# Patient Record
Sex: Female | Born: 1955 | Race: Black or African American | Hispanic: No | Marital: Single | Smoking: Never smoker
Health system: Southern US, Community
[De-identification: ages and names within clinical notes are randomized; demographics above are authoritative.]

## PROBLEM LIST (undated history)

## (undated) DIAGNOSIS — IMO0002 Reserved for concepts with insufficient information to code with codable children: Secondary | ICD-10-CM

## (undated) DIAGNOSIS — E785 Hyperlipidemia, unspecified: Secondary | ICD-10-CM

## (undated) DIAGNOSIS — I10 Essential (primary) hypertension: Secondary | ICD-10-CM

## (undated) DIAGNOSIS — R87619 Unspecified abnormal cytological findings in specimens from cervix uteri: Secondary | ICD-10-CM

## (undated) HISTORY — PX: ABDOMINAL HYSTERECTOMY: SHX81

## (undated) HISTORY — PX: OTHER SURGICAL HISTORY: SHX169

## (undated) HISTORY — DX: Unspecified abnormal cytological findings in specimens from cervix uteri: R87.619

## (undated) HISTORY — DX: Essential (primary) hypertension: I10

## (undated) HISTORY — DX: Hyperlipidemia, unspecified: E78.5

## (undated) HISTORY — DX: Reserved for concepts with insufficient information to code with codable children: IMO0002

## (undated) HISTORY — PX: COLPOSCOPY: SHX161

---

## 1997-11-26 ENCOUNTER — Encounter: Payer: Self-pay | Admitting: Emergency Medicine

## 1997-11-26 ENCOUNTER — Emergency Department (HOSPITAL_COMMUNITY): Admission: EM | Admit: 1997-11-26 | Discharge: 1997-11-26 | Payer: Self-pay | Admitting: Emergency Medicine

## 1999-09-02 ENCOUNTER — Encounter: Payer: Self-pay | Admitting: Family Medicine

## 1999-09-02 ENCOUNTER — Ambulatory Visit (HOSPITAL_COMMUNITY): Admission: RE | Admit: 1999-09-02 | Discharge: 1999-09-02 | Payer: Self-pay | Admitting: Family Medicine

## 2000-03-16 ENCOUNTER — Other Ambulatory Visit: Admission: RE | Admit: 2000-03-16 | Discharge: 2000-03-16 | Payer: Self-pay | Admitting: *Deleted

## 2000-06-30 ENCOUNTER — Emergency Department (HOSPITAL_COMMUNITY): Admission: EM | Admit: 2000-06-30 | Discharge: 2000-06-30 | Payer: Self-pay | Admitting: *Deleted

## 2001-01-08 ENCOUNTER — Encounter: Payer: Self-pay | Admitting: Family Medicine

## 2001-01-08 ENCOUNTER — Ambulatory Visit (HOSPITAL_COMMUNITY): Admission: RE | Admit: 2001-01-08 | Discharge: 2001-01-08 | Payer: Self-pay | Admitting: Family Medicine

## 2001-04-16 ENCOUNTER — Encounter: Admission: RE | Admit: 2001-04-16 | Discharge: 2001-04-16 | Payer: Self-pay | Admitting: *Deleted

## 2001-04-16 ENCOUNTER — Encounter: Payer: Self-pay | Admitting: *Deleted

## 2002-05-15 ENCOUNTER — Ambulatory Visit (HOSPITAL_COMMUNITY): Admission: RE | Admit: 2002-05-15 | Discharge: 2002-05-15 | Payer: Self-pay | Admitting: Family Medicine

## 2002-05-15 ENCOUNTER — Encounter: Payer: Self-pay | Admitting: Family Medicine

## 2003-05-19 ENCOUNTER — Emergency Department (HOSPITAL_COMMUNITY): Admission: EM | Admit: 2003-05-19 | Discharge: 2003-05-19 | Payer: Self-pay | Admitting: *Deleted

## 2004-04-26 ENCOUNTER — Encounter: Admission: RE | Admit: 2004-04-26 | Discharge: 2004-04-26 | Payer: Self-pay | Admitting: Family Medicine

## 2005-01-23 ENCOUNTER — Emergency Department (HOSPITAL_COMMUNITY): Admission: EM | Admit: 2005-01-23 | Discharge: 2005-01-23 | Payer: Self-pay | Admitting: Emergency Medicine

## 2005-08-01 ENCOUNTER — Emergency Department (HOSPITAL_COMMUNITY): Admission: EM | Admit: 2005-08-01 | Discharge: 2005-08-02 | Payer: Self-pay | Admitting: Emergency Medicine

## 2006-03-17 ENCOUNTER — Emergency Department (HOSPITAL_COMMUNITY): Admission: EM | Admit: 2006-03-17 | Discharge: 2006-03-17 | Payer: Self-pay | Admitting: Emergency Medicine

## 2006-10-24 ENCOUNTER — Ambulatory Visit (HOSPITAL_COMMUNITY): Admission: RE | Admit: 2006-10-24 | Discharge: 2006-10-24 | Payer: Self-pay | Admitting: Obstetrics and Gynecology

## 2007-01-22 ENCOUNTER — Inpatient Hospital Stay (HOSPITAL_COMMUNITY): Admission: RE | Admit: 2007-01-22 | Discharge: 2007-01-23 | Payer: Self-pay | Admitting: Obstetrics and Gynecology

## 2007-11-01 ENCOUNTER — Encounter: Admission: RE | Admit: 2007-11-01 | Discharge: 2007-11-01 | Payer: Self-pay | Admitting: Family Medicine

## 2008-12-11 ENCOUNTER — Encounter: Admission: RE | Admit: 2008-12-11 | Discharge: 2008-12-11 | Payer: Self-pay | Admitting: Family Medicine

## 2010-02-14 ENCOUNTER — Encounter: Payer: Self-pay | Admitting: Family Medicine

## 2010-04-06 ENCOUNTER — Other Ambulatory Visit: Payer: Self-pay | Admitting: Family Medicine

## 2010-04-06 DIAGNOSIS — R221 Localized swelling, mass and lump, neck: Secondary | ICD-10-CM

## 2010-04-06 DIAGNOSIS — Z1231 Encounter for screening mammogram for malignant neoplasm of breast: Secondary | ICD-10-CM

## 2010-04-09 ENCOUNTER — Ambulatory Visit
Admission: RE | Admit: 2010-04-09 | Discharge: 2010-04-09 | Disposition: A | Discharge status: Home / Self Care | Payer: Medicaid Other | Source: Ambulatory Visit | Attending: Family Medicine | Admitting: Family Medicine

## 2010-04-09 DIAGNOSIS — R221 Localized swelling, mass and lump, neck: Secondary | ICD-10-CM

## 2010-04-28 ENCOUNTER — Ambulatory Visit
Admission: RE | Admit: 2010-04-28 | Discharge: 2010-04-28 | Disposition: A | Discharge status: Home / Self Care | Payer: Self-pay | Source: Ambulatory Visit | Attending: Family Medicine | Admitting: Family Medicine

## 2010-04-28 DIAGNOSIS — Z1231 Encounter for screening mammogram for malignant neoplasm of breast: Secondary | ICD-10-CM

## 2010-06-08 NOTE — Op Note (Signed)
Stephanie [name], Stephanie [name]                ACCOUNT NO.:  [id number]   MEDICAL RECORD NO.:  [id number]          PATIENT TYPE:  AMB   LOCATION:  SDC                           FACILITY:  [facility]   PHYSICIAN:  Naima A. Dillard, M.D. DATE OF BIRTH:  [birth year]   DATE OF PROCEDURE:  01/22/2007  DATE OF DISCHARGE:                               OPERATIVE REPORT   TIME:  10:02 a.m.   PREOPERATIVE DIAGNOSIS:  Pelvic relaxation.   POSTOPERATIVE DIAGNOSIS:  Pelvic relaxation.   PROCEDURE:  Anterior and posterior repair and cystoscopy.   ASSISTANT:  Dr. Mancel Bale.   ANESTHESIA:  General.   FINDINGS:  Large cystocele, moderate rectocele.   SPECIMENS:  None.   ESTIMATED BLOOD LOSS:  Minimal about 100 mL.   </COM   COMPLICATIONS:  None.  The patient to PACU in stable condition.   PROCEDURE IN DETAIL:  The patient was taken to the operating room where  she was given general anesthesia, placed in a dorsal lithotomy position  and prepped and draped in a normal sterile fashion.  The cystocele was  large and protruding out of the vagina. Two tenaculum were placed on  either side of where she had her McCall suture prior placed over the  cervix was and 40 mL of Pitressin mixture, 20 mL of 100 was used to  infiltrate. The vaginal mucosa was cut with a knife and the  vesicovaginal mucosa was entered sharply and cut with Metzenbaum  scissors.  The bladder was then bluntly and sharply dissected away from  the vesicovaginal fascia. A Kelly plication was done with 2-0 Vicryl on  a UR5. The excess vaginal mucosa was excised and thrown away. The  vaginal mucosa was reapproximated using O chromic on the UR5 with a  running locked sutures.  Attention was then turned to the patient's  rectocele where 30 mL of Pitressin mixture was used to infiltrate this  area. The incision was made at the posterior portion of the vagina.  The  skin that was around the perineal body was also excised with a knife.  Once the posterior  Frechet,  the rectovaginal fascia was incised and  extended superiorly all way up into the apex where he finished the  anterior repair. The rectum was dissected off of the fascia.  The  patient actually had a small enterocele which was fixed by putting a  pursestring suture around the fascia.  The abdominal cavity was not  entered. The rectovaginal fascia was reapproximated with 2-0 Vicryl.  The perineal body was also reinforced by using 2-0 Vicryl on the  bulbocavernosus muscles.  The skin was reapproximated using 0 chromic in  a subcuticular fashion.  A rectal exam was done at the end of the case.  No suture was palpated in the rectum. A cystoscopy was done. Both  ureters were seen  in the bladder. The bladder had great integrity. The vagina was packed  with 1-inch packing with Estrace cream. The  rectum there was done at the end of the case and no suture was palpated.  Sponge, lap and needle counts  were correct.  The patient went to the  recovery room in stable condition.  The Foley catheter was replaced  after cystoscopy and I did use Betadine to help prevent infection.      Naima A. Charlesetta Garibaldi, M.D.  Electronically Signed     NAD/MEDQ  D:  01/22/2007  T:  01/22/2007  Job:  [id number]

## 2010-06-08 NOTE — H&P (Signed)
NAMEMARILIA, [name]                ACCOUNT NO.:  [id number]   MEDICAL RECORD NO.:  [id number]          PATIENT TYPE:  AMB   LOCATION:  SDC                           FACILITY:  [facility]   PHYSICIAN:  Naima A. Dillard, M.D. DATE OF BIRTH:  1955-08-23   DATE OF ADMISSION:  DATE OF DISCHARGE:                              HISTORY & PHYSICAL   CHIEF COMPLAINT:  Pelvic relaxation.   Ms. [name] is a 55 year old single African-American female, gravida 2,  para 2, who presents for anterior posterior repair of symptomatic pelvic  relaxation and urinary incontinence.  For the past year, the patient has  noted leaking of urine whenever she coughs in spite of practicing Kegel  exercises.  The patient underwent urodynamics to evaluate her bladder  dysfunction and was found not to have stress urinary incontinence.  Consequently, the patient was given options of observation, pessary,  anterior posterior repair for pelvic relaxation.  She has chosen to  proceed with anterior posterior repair.  Her surgery was cancelled in  the past because of hyperglycemia.  She now states that she has been  compliant with her medications and has her blood sugars under better  control.   PAST MEDICAL HISTORY:  Significant for:  1. Diabetes.  2. Hypertension.  3. Asthma.   PAST OB HISTORY:  Significant for vaginal delivery x2.   GYN HISTORY:  Menarche at age 49 years old.  She had a hysterectomy back  in 2004.  She denies any history of abnormal Pap smears or sexually  transmitted diseases.  Her last Pap smear was May 2008.   PAST SURGICAL HISTORY:  In 2004, she had abdominal hysterectomy.  She  denies any problems with anesthesia or history of blood transfusions.   FAMILY HISTORY:  Significant for mother with diabetes.   SOCIAL HISTORY:  The patient is single, and she works as a Museum/gallery conservator.   HABITS:  She denies any alcohol, tobacco, or illicit drug use.   CURRENT MEDICATIONS:  1. Metformin  500 mg b.i.d.  2. Glimepiride 4 mg daily.  3. Triamterene/hydrochlorothiazide 37.5/25 mg 1 daily.  4. Aspirin 81 mg daily.  5. Albuterol inhaler p.r.n.   ALLERGIES:  CODEINE caused nausea.   REVIEW OF SYSTEMS:  The patient does wear glasses.  CARDIOVASCULAR:  She  denies any chest pain.  RESPIRATORY:  She has a history of asthma.  NEUROLOGIC:  She denies any headache or visual changes.  GENITOURINARY:  As above.  MUSCULOSKELETAL:  Unremarkable.  ENDOCRINE:  Significant for  diabetes.   PHYSICAL EXAMINATION:  VITAL SIGNS:  The patient weighs 258 pounds.  Blood pressure 140/80.  She is 5 feet 2-1/2 inches tall.  HEENT:  Pupils are equal.  Hearing is normal.  Throat is clear.  NECK:  Supple without masses.  There is no thyromegaly or cervical  adenopathy.  HEART:  Regular rate and rhythm.  LUNGS:  Clear to auscultation bilaterally.  BACK:  No CVA tenderness.  ABDOMEN:  Nontender without any masses or organomegaly.  EXTREMITIES:  No cyanosis, clubbing, or edema.  PELVIC:  EG/BUS  within normal limits.  Vagina has a large cystocele.  Uterus and cervix are surgically absent.  Adnexa without any masses or  tenderness.  RECTOVAGINAL:  There is a noticeable rectocele  which is very small and  without any masses.   IMPRESSION:  1. Pelvic relaxation.  2. Urinary incontinence.   PLAN:  Anterior posterior repair.  The patient understands the risks are  but not limited to bleeding, infection, damage to internal organ  systems, bowel, bladder, major blood vessels, and need for repeat  surgery in the future.  The patient verbalizes understanding and  understands the risks.  We will also be performing cystoscopy.      Naima A. Charlesetta Garibaldi, M.D.  Electronically Signed     NAD/MEDQ  D:  01/21/2007  T:  01/21/2007  Job:  [id number]

## 2010-06-08 NOTE — H&P (Signed)
Stephanie [name], Stephanie [name]                ACCOUNT NO.:  [id number]   MEDICAL RECORD NO.:  [id number]          PATIENT TYPE:  AMB   LOCATION:  SDC                           FACILITY:  [facility]   PHYSICIAN:  Naima A. Dillard, M.D. DATE OF BIRTH:  03-Apr-1955   DATE OF ADMISSION:  10/25/2006  DATE OF DISCHARGE:                              HISTORY & PHYSICAL   HISTORY OF PRESENT ILLNESS:  Ms. Kunkel is a 55 year old single African  American female, status post hysterectomy, para 2-0-0-2, who presents  for an anterior and posterior repair because of symptomatic pelvic  relaxation and urinary incontinence.  For the past year, the patient has  noticed leaking of urine whenever she coughs, in spite of practicing  Kegel exercises.  The patient underwent urodynamics to evaluate her  bladder dysfunction and was found not to have stress urinary  incontinence.  Consequently, the patient was given the options of  observation, pessary, or anterior-posterior repair for her pelvic  relaxation, and she has chosen to proceed with anterior-posterior  repair.   PAST MEDICAL HISTORY:   OB HISTORY:  Gravida 2, para 2-0-0-2.   GYN HISTORY:  Menarche at 55 years old.  The patient has had a  hysterectomy.  Denies any history of abnormal Pap smears or sexually  transmitted diseases.  Last normal Pap smear was May 2008.   MEDICAL HISTORY:  1. Hypertension.  2. Asthma.  3. Diabetes mellitus.   SURGICAL HISTORY:  1. 2004, total abdominal hysterectomy.  2. She denies any problems with anesthesia.  3. She denies any history of blood transfusions.   FAMILY HISTORY:  Positive for diabetes.   SOCIAL HISTORY:  The patient is single, and she works as a Museum/gallery conservator.   HABITS:  She denies use of alcohol, tobacco, or illicit drugs.   CURRENT MEDICATIONS:  1. Metformin 500 mg 2 tablets daily.  2. Glimepiride  4 mg daily.  3. Triamterene/hydrochlorothiazide 37.5/25, 1 tablet daily.  4. Aspirin 81 mg  daily.  5. Albuterol inhaler as needed.   ALLERGIES:  CODEINE, which causes severe nausea.   REVIEW OF SYSTEMS:  The patient does wear glasses.  She denies any chest  pain, shortness of breath, headache, vision changes, dysuria, hematuria,  nausea, vomiting, diarrhea pelvic pain, and except as is mentioned in  history of present illness, the patient's review of systems is negative.   PHYSICAL EXAMINATION:  VITAL SIGNS:  Blood pressure 140/80, weight is  262, height is 5 feet 2-1/2inches tall.  ENT:  Pupils are equal.  Hearing is normal.  Throat is clear.  NECK: Supple, without masses.  There is no thyromegaly or cervical  adenopathy.  HEART:  Regular rate and rhythm.  LUNGS:  Clear.  BACK:  No CVA tenderness.  ABDOMEN:  No tenderness, masses, or organomegaly.  EXTREMITIES:  No clubbing, cyanosis, or edema.  PELVIC:  EGBUS is within normal limits.  Vagina reveals a large  cystocele.  Uterus and cervix are surgically absent.  Adnexa without any  masses or tenderness.  Rectovaginal without masses, though there is a  noticeable  mild rectocele.   IMPRESSION:  1. Symptomatic pelvic relaxation.  2. Urinary incontinence.   DISPOSITION:  A discussion was held with the patient regarding the  indications for her procedure, along with its risks which include but  are not limited to reaction to anesthesia, damage to adjacent organs,  infection, excessive bleeding, and worsening of her incontinence  symptoms.  The patient verbalized understanding of these risks and has  consented to proceed with an anterior-posterior repair at [facility] on October 25, 2006 at 9:30 a.m.      [name].      Naima A. Charlesetta Garibaldi, M.D.  Electronically Signed    EJP/MEDQ  D:  10/23/2006  T:  10/23/2006  Job:  [id number]

## 2010-06-11 NOTE — Discharge Summary (Signed)
[name], [name]                ACCOUNT NO.:  [id number]   MEDICAL RECORD NO.:  [id number]          PATIENT TYPE:  INP   LOCATION:  9306                          FACILITY:  [facility]   PHYSICIAN:  Naima A. Dillard, M.D. DATE OF BIRTH:  [birth year]   DATE OF ADMISSION:  01/22/2007  DATE OF DISCHARGE:  01/23/2007                               DISCHARGE SUMMARY   DISCHARGE DIAGNOSIS:  Symptomatic pelvic relaxation.   OPERATION:  On the date of admission the patient underwent an  anterior/posterior repair followed by cystoscopy, tolerating procedure  well.   HISTORY OF PRESENT ILLNESS:  [name] is a 55 year old single African  American female gravida 2, para 2 who presents for anterior/posterior  repair because of symptomatic pelvic relaxation and urinary  incontinence.  Please see the patient's dictated history and physical  examination for details.   PREOPERATIVE PHYSICAL EXAM:  Blood pressure 140/80, weight 258 pounds,  height 5 feet 2-and-a-half inches tall.  GENERAL EXAM:  Within normal limits.  PELVIC EXAM:  EG/BUS within normal limits.  Vagina has a very large  cystocele.  Uterus and cervix were surgically absent.  Adnexa was  without any masses or tenderness   HOSPITAL COURSE:  On the day of admission the patient underwent  aforementioned procedure, tolerating it well.  By postoperative day #1  the patient had resumed bowel and bladder function with a postvoid  residual of less than 100 and was therefore deemed ready for discharge  home.  The patient further tolerated a postoperative hemoglobin of 10.2  (preoperative hemoglobin 11.7).  The patient's discharge basic metabolic  panel revealed a sodium of 137, potassium 3.6, chloride 100, CO2 30,  glucose 138, BUN 9, creatinine 0.73, and calcium 9.7.   DISCHARGE MEDICATIONS:  The patient was referred to her home medication  reconciliation form.  She was advised to take:  1. Colace 100 mg twice daily until her bowel movements  are regular.  2. Darvocet-N 100 one to two tablets every 6 hours as needed for pain.  3. Motrin 600 mg every 6 hours with food as needed for pain.   FOLLOWUP:  The patient has an appointment with Dr. Charlesetta Garibaldi on February 05, 2007, at 4:30 p.m.   DISCHARGE INSTRUCTIONS:  The patient was advised to call for any  temperature greater than or equal to 100.5 degrees Fahrenheit orally,  heavy vaginal bleeding, or any pain that is not relieved by her pain  medication.  She was further advised to avoid driving for 2 weeks, heavy  lifting for 6 weeks, intercourse for 6 weeks, that she may walk up  steps, that she may shower.  The patient's diet should be a diabetic  diet.      Stephanie [name].      Naima A. Charlesetta Garibaldi, M.D.  Electronically Signed    EJP/MEDQ  D:  02/10/2007  T:  02/10/2007  Job:  [id number]

## 2010-07-10 ENCOUNTER — Emergency Department (HOSPITAL_COMMUNITY)
Admission: EM | Admit: 2010-07-10 | Discharge: 2010-07-10 | Disposition: A | Discharge status: Home / Self Care | Payer: Self-pay | Attending: Emergency Medicine | Admitting: Emergency Medicine

## 2010-07-10 DIAGNOSIS — R51 Headache: Secondary | ICD-10-CM | POA: Insufficient documentation

## 2010-07-10 DIAGNOSIS — Z79899 Other long term (current) drug therapy: Secondary | ICD-10-CM | POA: Insufficient documentation

## 2010-07-10 DIAGNOSIS — E669 Obesity, unspecified: Secondary | ICD-10-CM | POA: Insufficient documentation

## 2010-07-10 DIAGNOSIS — I10 Essential (primary) hypertension: Secondary | ICD-10-CM | POA: Insufficient documentation

## 2010-07-10 DIAGNOSIS — Z7982 Long term (current) use of aspirin: Secondary | ICD-10-CM | POA: Insufficient documentation

## 2010-07-10 DIAGNOSIS — H81399 Other peripheral vertigo, unspecified ear: Secondary | ICD-10-CM | POA: Insufficient documentation

## 2010-07-10 DIAGNOSIS — E119 Type 2 diabetes mellitus without complications: Secondary | ICD-10-CM | POA: Insufficient documentation

## 2010-07-10 LAB — POCT I-STAT, CHEM 8
BUN: 16 mg/dL (ref 6–23)
Calcium, Ion: 1.27 mmol/L (ref 1.12–1.32)
Chloride: 99 mEq/L (ref 96–112)
Creatinine, Ser: 0.8 mg/dL (ref 0.50–1.10)
Glucose, Bld: 158 mg/dL — ABNORMAL HIGH (ref 70–99)
HCT: 41 % (ref 36.0–46.0)
Hemoglobin: 13.9 g/dL (ref 12.0–15.0)
Potassium: 3.9 mEq/L (ref 3.5–5.1)
Sodium: 139 mEq/L (ref 135–145)
TCO2: 30 mmol/L (ref 0–100)

## 2010-08-23 ENCOUNTER — Ambulatory Visit (INDEPENDENT_AMBULATORY_CARE_PROVIDER_SITE_OTHER): Payer: Self-pay | Admitting: Ophthalmology

## 2010-09-15 ENCOUNTER — Emergency Department (HOSPITAL_COMMUNITY): Payer: Self-pay

## 2010-09-15 ENCOUNTER — Emergency Department (HOSPITAL_COMMUNITY)
Admission: EM | Admit: 2010-09-15 | Discharge: 2010-09-15 | Disposition: A | Discharge status: Home / Self Care | Payer: Self-pay | Attending: Emergency Medicine | Admitting: Emergency Medicine

## 2010-09-15 DIAGNOSIS — E119 Type 2 diabetes mellitus without complications: Secondary | ICD-10-CM | POA: Insufficient documentation

## 2010-09-15 DIAGNOSIS — M545 Low back pain, unspecified: Secondary | ICD-10-CM | POA: Insufficient documentation

## 2010-09-15 DIAGNOSIS — R1031 Right lower quadrant pain: Secondary | ICD-10-CM | POA: Insufficient documentation

## 2010-09-15 DIAGNOSIS — R109 Unspecified abdominal pain: Secondary | ICD-10-CM | POA: Insufficient documentation

## 2010-09-15 DIAGNOSIS — I10 Essential (primary) hypertension: Secondary | ICD-10-CM | POA: Insufficient documentation

## 2010-09-15 DIAGNOSIS — IMO0002 Reserved for concepts with insufficient information to code with codable children: Secondary | ICD-10-CM | POA: Insufficient documentation

## 2010-09-15 DIAGNOSIS — X500XXA Overexertion from strenuous movement or load, initial encounter: Secondary | ICD-10-CM | POA: Insufficient documentation

## 2010-09-15 LAB — POCT I-STAT, CHEM 8
BUN: 26 mg/dL — ABNORMAL HIGH (ref 6–23)
Calcium, Ion: 1.25 mmol/L (ref 1.12–1.32)
Chloride: 100 mEq/L (ref 96–112)
Creatinine, Ser: 1 mg/dL (ref 0.50–1.10)
Glucose, Bld: 291 mg/dL — ABNORMAL HIGH (ref 70–99)
HCT: 36 % (ref 36.0–46.0)
Hemoglobin: 12.2 g/dL (ref 12.0–15.0)
Potassium: 4 mEq/L (ref 3.5–5.1)
Sodium: 136 mEq/L (ref 135–145)
TCO2: 26 mmol/L (ref 0–100)

## 2010-10-29 LAB — CBC
HCT: 30.6 — ABNORMAL LOW
HCT: 34.8 — ABNORMAL LOW
Hemoglobin: 10.2 — ABNORMAL LOW
Hemoglobin: 11.7 — ABNORMAL LOW
MCHC: 33.5
MCHC: 33.5
MCV: 86.5
MCV: 87
Platelets: 284
Platelets: 286
RBC: 3.52 — ABNORMAL LOW
RBC: 4.03
RDW: 14.6
RDW: 14.6
WBC: 11.6 — ABNORMAL HIGH
WBC: 8.8

## 2010-10-29 LAB — BASIC METABOLIC PANEL
BUN: 14
BUN: 9
CO2: 30
CO2: 30
Calcium: 8.6
Calcium: 9.7
Chloride: 100
Chloride: 100
Creatinine, Ser: 0.72
Creatinine, Ser: 0.73
GFR calc Af Amer: >60
GFR calc Af Amer: >60
GFR calc non Af Amer: >60
GFR calc non Af Amer: >60
Glucose, Bld: 127 — ABNORMAL HIGH
Glucose, Bld: 138 — ABNORMAL HIGH
Potassium: 3.6
Potassium: 4.1
Sodium: 137
Sodium: 141

## 2010-11-04 LAB — CBC
HCT: 40
Hemoglobin: 13.4
MCHC: 33.6
MCV: 85.2
Platelets: 274
RBC: 4.69
RDW: 14.3 — ABNORMAL HIGH
WBC: 6.9

## 2010-11-04 LAB — BASIC METABOLIC PANEL
BUN: 14
CO2: 29
Calcium: 9.4
Chloride: 97
Creatinine, Ser: 0.7
GFR calc Af Amer: >60
GFR calc non Af Amer: >60
Glucose, Bld: 325 — ABNORMAL HIGH
Potassium: 3.8
Sodium: 135

## 2010-11-24 ENCOUNTER — Other Ambulatory Visit: Payer: Self-pay | Admitting: Family Medicine

## 2010-11-24 DIAGNOSIS — I2510 Atherosclerotic heart disease of native coronary artery without angina pectoris: Secondary | ICD-10-CM

## 2010-12-02 ENCOUNTER — Other Ambulatory Visit: Payer: Self-pay

## 2010-12-07 ENCOUNTER — Ambulatory Visit
Admission: RE | Admit: 2010-12-07 | Discharge: 2010-12-07 | Disposition: A | Discharge status: Home / Self Care | Payer: Medicare Other | Source: Ambulatory Visit | Attending: Family Medicine | Admitting: Family Medicine

## 2010-12-07 DIAGNOSIS — I2510 Atherosclerotic heart disease of native coronary artery without angina pectoris: Secondary | ICD-10-CM

## 2010-12-08 ENCOUNTER — Other Ambulatory Visit: Payer: Self-pay

## 2010-12-26 ENCOUNTER — Emergency Department (HOSPITAL_COMMUNITY): Payer: Medicare Other

## 2010-12-26 ENCOUNTER — Emergency Department (HOSPITAL_COMMUNITY)
Admission: EM | Admit: 2010-12-26 | Discharge: 2010-12-27 | Disposition: A | Discharge status: Home / Self Care | Payer: Medicare Other | Attending: Emergency Medicine | Admitting: Emergency Medicine

## 2010-12-26 ENCOUNTER — Encounter: Payer: Self-pay | Admitting: Emergency Medicine

## 2010-12-26 ENCOUNTER — Other Ambulatory Visit: Payer: Self-pay

## 2010-12-26 DIAGNOSIS — M25519 Pain in unspecified shoulder: Secondary | ICD-10-CM | POA: Insufficient documentation

## 2010-12-26 DIAGNOSIS — E119 Type 2 diabetes mellitus without complications: Secondary | ICD-10-CM | POA: Insufficient documentation

## 2010-12-26 DIAGNOSIS — W010XXA Fall on same level from slipping, tripping and stumbling without subsequent striking against object, initial encounter: Secondary | ICD-10-CM | POA: Insufficient documentation

## 2010-12-26 DIAGNOSIS — M79609 Pain in unspecified limb: Secondary | ICD-10-CM | POA: Insufficient documentation

## 2010-12-26 DIAGNOSIS — J45909 Unspecified asthma, uncomplicated: Secondary | ICD-10-CM | POA: Insufficient documentation

## 2010-12-26 DIAGNOSIS — W1800XA Striking against unspecified object with subsequent fall, initial encounter: Secondary | ICD-10-CM

## 2010-12-26 DIAGNOSIS — R51 Headache: Secondary | ICD-10-CM | POA: Insufficient documentation

## 2010-12-26 LAB — URINALYSIS, ROUTINE W REFLEX MICROSCOPIC
Bilirubin Urine: NEGATIVE
Glucose, UA: >1000 mg/dL — AB
Hgb urine dipstick: NEGATIVE
Ketones, ur: NEGATIVE mg/dL
Leukocytes, UA: NEGATIVE
Nitrite: NEGATIVE
Protein, ur: NEGATIVE mg/dL
Specific Gravity, Urine: 1.025 (ref 1.005–1.030)
Urobilinogen, UA: 1 mg/dL (ref 0.0–1.0)
pH: 6 (ref 5.0–8.0)

## 2010-12-26 LAB — URINE MICROSCOPIC-ADD ON

## 2010-12-26 MED ORDER — ACETAMINOPHEN 325 MG PO TABS
650.0000 mg | ORAL_TABLET | Freq: Once | ORAL | Status: AC
  Administered 2010-12-26: 650 mg via ORAL
  Filled 2010-12-26: qty 2

## 2010-12-26 NOTE — ED Notes (Signed)
Pt also c/o right shoulder pain and right scapular pain

## 2010-12-26 NOTE — ED Notes (Signed)
Pt states shoe got hung on broken marble in her bathroom and she fell around 6:45.  C/o pain to top of head from hitting it on the commode.  Also c/o pain to R forearm and R sided upper chest pain.  Denies neck pain.

## 2010-12-26 NOTE — ED Provider Notes (Signed)
History     CSN: [id number] Arrival date & time: 12/26/2010  8:36 PM   First MD Initiated Contact with Patient 12/26/10 2212      Chief Complaint  Patient presents with  . Fall    (Consider location/radiation/quality/duration/timing/severity/associated sxs/prior treatment) Patient is a 55 y.o. female presenting with fall. The history is provided by the patient. No language interpreter was used.  Fall The accident occurred 3 to 5 hours ago. The fall occurred while standing. There was no blood loss. The point of impact was the right shoulder and head. The pain is present in the head and right shoulder (R fa). The pain is at a severity of 7/10. The pain is moderate. She was ambulatory at the scene. There was no entrapment after the fall. There was no drug use involved in the accident. There was no alcohol use involved in the accident. Associated symptoms include headaches. Pertinent negatives include no visual change, no fever, no numbness, no nausea, no vomiting, no loss of consciousness and no tingling. She has tried nothing for the symptoms.   She reports that she was walking into the bathroom to use the bathroom and tripped over a tile in the bathroom and hit her head on the toilet her right shoulder on the toilet and her right forearm.  Complaining of head pain right shoulder pain and right forearm pain. Right forearm shows a bruise. Full range of motion to the shoulder. Is taking nothing for pain. Patient denies, chest pain, dizziness or loss of consciousness. Past Medical History  Diagnosis Date  . Diabetes mellitus   . Asthma     History reviewed. No pertinent past surgical history.  No family history on file.  History  Substance Use Topics  . Smoking status: Never Smoker   . Smokeless tobacco: Not on file  . Alcohol Use: No    OB History    Grav Para Term Preterm Abortions TAB SAB Ect Mult Living                  Review of Systems  Constitutional: Negative for fever.    Gastrointestinal: Negative for nausea and vomiting.  Neurological: Positive for headaches. Negative for tingling, loss of consciousness and numbness.  All other systems reviewed and are negative.    Allergies  Avapro and Codeine  Home Medications   Current Outpatient Rx  Name Route Sig Dispense Refill  . ASPIRIN 81 MG PO TABS Oral Take 81 mg by mouth daily.      Marland Kitchen GLIMEPIRIDE PO Oral Take 1 tablet by mouth 2 (two) times daily. Pt. Is unsure of the strength of her medication.     Marland Kitchen METFORMIN HCL 500 MG PO TABS Oral Take 500 mg by mouth 2 (two) times daily with a meal.      . OLMESARTAN MEDOXOMIL 20 MG PO TABS Oral Take 20 mg by mouth daily.      . TRIAMTERENE-HCTZ 37.5-25 MG PO TABS Oral Take 1 tablet by mouth daily.        BP 112/63  Pulse 106  Temp(Src) 99.2 F (37.3 C) (Oral)  Resp 21  SpO2 95%  Physical Exam  Nursing note and vitals reviewed. Constitutional: She appears well-developed and well-nourished.  HENT:  Head: Normocephalic.  Cardiovascular: Normal rate.   Pulmonary/Chest: Effort normal.  Abdominal: Soft.  Musculoskeletal:       Headache bruising on the crown.  R fa and r shoulder tender.    ED Course  Procedures (including  critical care time)  Labs Reviewed  URINALYSIS, ROUTINE W REFLEX MICROSCOPIC - Abnormal; Notable for the following:    Glucose, UA >1000 (*)    All other components within normal limits  GLUCOSE, CAPILLARY - Abnormal; Notable for the following:    Glucose-Capillary 338 (*)    All other components within normal limits  URINE MICROSCOPIC-ADD ON  POCT CBG MONITORING   Dg Shoulder Right  12/26/2010  *RADIOLOGY REPORT*  Clinical Data: Status post fall; pain at the lateral aspect of the right shoulder.  RIGHT SHOULDER - 2+ VIEW  Comparison: None.  Findings: There is no evidence of fracture or dislocation.  Mild irregularity at the distal tip of the scapula is likely degenerative in nature.  The right humeral head is seated within the  glenoid fossa.  Significant degenerative change is noted at the right acromioclavicular joint.  Inferior osteophyte formation corresponds to an increased anatomic risk for impingement.  No significant soft tissue abnormalities are seen.  The visualized portions of the right lung are clear.  IMPRESSION:  1.  No evidence of fracture or dislocation. 2.  Degenerative change at the right acromioclavicular joint; inferior osteophyte formation corresponds to an increased anatomic risk for rotator cuff impingement.  Original Report Authenticated By: Santa Lighter, M.D.   Dg Forearm Right  12/26/2010  *RADIOLOGY REPORT*  Clinical Data: Status post fall.  Proximal forearm pain with swelling.  RIGHT FOREARM - 2 VIEW  Comparison: None.  Findings: The mineralization and alignment are normal.  There is no evidence of acute fracture or dislocation.  No soft tissue abnormalities are identified.  There is mild spurring at the medial humeral epicondyle.  No elbow joint effusion is demonstrated. 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  IMPRESSION: No acute osseous findings.  Original Report Authenticated By: Vivia Ewing, M.D.   Ct Head Wo Contrast  12/26/2010  *RADIOLOGY REPORT*  Clinical Data: Status post fall, with injury to the posterior right side of the head from toilet.  Headache.  CT HEAD WITHOUT CONTRAST  Technique:  Contiguous axial images were obtained from the base of the skull through the vertex without contrast.  Comparison: None.  Findings: There is no evidence of acute infarction, mass lesion, or intra- or extra-axial hemorrhage on CT.  The posterior fossa, including the cerebellum, brainstem and fourth ventricle, is within normal limits.  The third and lateral ventricles, and basal ganglia are unremarkable in appearance.  The cerebral hemispheres are symmetric in appearance, with normal gray- white differentiation.  No mass effect or midline shift is seen.  There is no evidence of fracture;  visualized osseous structures are unremarkable in appearance.  The visualized portions of the orbits are within normal limits.  The paranasal sinuses and mastoid air cells are well-aerated.  No significant soft tissue abnormalities are seen.  IMPRESSION: No evidence of traumatic intracranial injury or fracture.  Original Report Authenticated By: Santa Lighter, M.D.     1. Fall against object       MDM  Here for follow in the bathroom hitting her head right shoulder and right forearm. CT of the head was negative plain films of the right shoulder and right forearm negative. She was given ibuprofen for pain with relief. She was also given a sling for her right forearm.  The urine showed glucose was greater than 1000. The CBG showed greater than 300. Patient admits that she hasn't taken her metformin and at least 2 days. States that she has taken more  and she will get the prescription filled. We gave 1000 of the metformin in the ER prior to her leaving. She refuses insulin. Labs Reviewed  URINALYSIS, ROUTINE W REFLEX MICROSCOPIC - Abnormal; Notable for the following:    Glucose, UA >1000 (*)    All other components within normal limits  GLUCOSE, CAPILLARY - Abnormal; Notable for the following:    Glucose-Capillary 338 (*)    All other components within normal limits  URINE MICROSCOPIC-ADD ON  POCT CBG MONITORING    Date: 12/27/2010  Rate: 110  Rhythm: normal sinus rhythm  QRS Axis: normal  Intervals: normal  ST/T Wave abnormalities: normal  Conduction Disutrbances:none  Narrative Interpretation:   Old EKG Reviewed: unchanged          Sheryle Hail, NP 12/27/10 [place], NP 12/27/10 0131

## 2010-12-27 LAB — GLUCOSE, CAPILLARY: Glucose-Capillary: 338 mg/dL — ABNORMAL HIGH (ref 70–99)

## 2010-12-27 MED ORDER — OXYCODONE-ACETAMINOPHEN 5-325 MG PO TABS: 1.0000 | ORAL_TABLET | Freq: Once | ORAL | Status: DC

## 2010-12-27 MED ORDER — METFORMIN HCL 500 MG PO TABS
1000.0000 mg | ORAL_TABLET | ORAL | Status: AC
  Administered 2010-12-27: 1000 mg via ORAL
  Filled 2010-12-27: qty 2

## 2010-12-27 MED ORDER — IBUPROFEN 800 MG PO TABS
800.0000 mg | ORAL_TABLET | Freq: Once | ORAL | Status: AC
  Administered 2010-12-27: 800 mg via ORAL
  Filled 2010-12-27: qty 1

## 2010-12-27 NOTE — ED Provider Notes (Signed)
Medical screening examination/treatment/procedure(s) were performed by non-physician practitioner and as supervising physician I was immediately available for consultation/collaboration.   Mervin Kung, MD 12/27/10 [phone]

## 2011-01-27 DIAGNOSIS — R87612 Low grade squamous intraepithelial lesion on cytologic smear of cervix (LGSIL): Secondary | ICD-10-CM | POA: Diagnosis not present

## 2011-02-22 DIAGNOSIS — R87612 Low grade squamous intraepithelial lesion on cytologic smear of cervix (LGSIL): Secondary | ICD-10-CM | POA: Diagnosis not present

## 2011-05-09 DIAGNOSIS — E11311 Type 2 diabetes mellitus with unspecified diabetic retinopathy with macular edema: Secondary | ICD-10-CM | POA: Diagnosis not present

## 2011-05-09 DIAGNOSIS — H35359 Cystoid macular degeneration, unspecified eye: Secondary | ICD-10-CM | POA: Diagnosis not present

## 2011-05-09 DIAGNOSIS — E11319 Type 2 diabetes mellitus with unspecified diabetic retinopathy without macular edema: Secondary | ICD-10-CM | POA: Diagnosis not present

## 2011-05-17 ENCOUNTER — Other Ambulatory Visit: Payer: Self-pay | Admitting: Family Medicine

## 2011-05-17 DIAGNOSIS — Z1231 Encounter for screening mammogram for malignant neoplasm of breast: Secondary | ICD-10-CM

## 2011-05-18 ENCOUNTER — Emergency Department (HOSPITAL_COMMUNITY): Payer: Medicare Other

## 2011-05-18 ENCOUNTER — Encounter (HOSPITAL_COMMUNITY): Payer: Self-pay | Admitting: *Deleted

## 2011-05-18 ENCOUNTER — Emergency Department (HOSPITAL_COMMUNITY)
Admission: EM | Admit: 2011-05-18 | Discharge: 2011-05-18 | Disposition: A | Discharge status: Home / Self Care | Payer: Medicare Other | Attending: Emergency Medicine | Admitting: Emergency Medicine

## 2011-05-18 DIAGNOSIS — K5790 Diverticulosis of intestine, part unspecified, without perforation or abscess without bleeding: Secondary | ICD-10-CM

## 2011-05-18 DIAGNOSIS — K5730 Diverticulosis of large intestine without perforation or abscess without bleeding: Secondary | ICD-10-CM | POA: Diagnosis not present

## 2011-05-18 DIAGNOSIS — K7689 Other specified diseases of liver: Secondary | ICD-10-CM | POA: Diagnosis not present

## 2011-05-18 DIAGNOSIS — E119 Type 2 diabetes mellitus without complications: Secondary | ICD-10-CM | POA: Diagnosis not present

## 2011-05-18 DIAGNOSIS — J45909 Unspecified asthma, uncomplicated: Secondary | ICD-10-CM | POA: Insufficient documentation

## 2011-05-18 DIAGNOSIS — R1032 Left lower quadrant pain: Secondary | ICD-10-CM | POA: Insufficient documentation

## 2011-05-18 DIAGNOSIS — R109 Unspecified abdominal pain: Secondary | ICD-10-CM | POA: Diagnosis not present

## 2011-05-18 DIAGNOSIS — K439 Ventral hernia without obstruction or gangrene: Secondary | ICD-10-CM | POA: Diagnosis not present

## 2011-05-18 LAB — DIFFERENTIAL
Basophils Absolute: 0 10*3/uL (ref 0.0–0.1)
Basophils Relative: 0 % (ref 0–1)
Eosinophils Absolute: 0.1 10*3/uL (ref 0.0–0.7)
Eosinophils Relative: 1 % (ref 0–5)
Lymphocytes Relative: 36 % (ref 12–46)
Lymphs Abs: 2.8 10*3/uL (ref 0.7–4.0)
Monocytes Absolute: 0.5 10*3/uL (ref 0.1–1.0)
Monocytes Relative: 6 % (ref 3–12)
Neutro Abs: 4.5 10*3/uL (ref 1.7–7.7)
Neutrophils Relative %: 57 % (ref 43–77)

## 2011-05-18 LAB — CBC
HCT: 35.4 % — ABNORMAL LOW (ref 36.0–46.0)
Hemoglobin: 11.6 g/dL — ABNORMAL LOW (ref 12.0–15.0)
MCH: 28.5 pg (ref 26.0–34.0)
MCHC: 32.8 g/dL (ref 30.0–36.0)
MCV: 87 fL (ref 78.0–100.0)
Platelets: 252 10*3/uL (ref 150–400)
RBC: 4.07 MIL/uL (ref 3.87–5.11)
RDW: 13.4 % (ref 11.5–15.5)
WBC: 7.9 10*3/uL (ref 4.0–10.5)

## 2011-05-18 LAB — URINALYSIS, ROUTINE W REFLEX MICROSCOPIC
Bilirubin Urine: NEGATIVE
Glucose, UA: NEGATIVE mg/dL
Hgb urine dipstick: NEGATIVE
Ketones, ur: NEGATIVE mg/dL
Leukocytes, UA: NEGATIVE
Nitrite: NEGATIVE
Protein, ur: NEGATIVE mg/dL
Specific Gravity, Urine: 1.011 (ref 1.005–1.030)
Urobilinogen, UA: 0.2 mg/dL (ref 0.0–1.0)
pH: 6 (ref 5.0–8.0)

## 2011-05-18 LAB — BASIC METABOLIC PANEL
BUN: 18 mg/dL (ref 6–23)
CO2: 25 mEq/L (ref 19–32)
Calcium: 10 mg/dL (ref 8.4–10.5)
Chloride: 100 mEq/L (ref 96–112)
Creatinine, Ser: 0.8 mg/dL (ref 0.50–1.10)
GFR calc Af Amer: >90 mL/min (ref >90)
GFR calc non Af Amer: 80 mL/min — ABNORMAL LOW (ref >90)
Glucose, Bld: 169 mg/dL — ABNORMAL HIGH (ref 70–99)
Potassium: 4.4 mEq/L (ref 3.5–5.1)
Sodium: 137 mEq/L (ref 135–145)

## 2011-05-18 LAB — GLUCOSE, CAPILLARY: Glucose-Capillary: 147 mg/dL — ABNORMAL HIGH (ref 70–99)

## 2011-05-18 MED ORDER — IOHEXOL 300 MG/ML  SOLN
100.0000 mL | Freq: Once | INTRAMUSCULAR | Status: AC | PRN
  Administered 2011-05-18: 100 mL via INTRAVENOUS

## 2011-05-18 MED ORDER — CIPROFLOXACIN HCL 500 MG PO TABS: 500.0000 mg | ORAL_TABLET | Freq: Two times a day (BID) | ORAL | Status: AC

## 2011-05-18 MED ORDER — METRONIDAZOLE 500 MG PO TABS: 500.0000 mg | ORAL_TABLET | Freq: Two times a day (BID) | ORAL | Status: AC

## 2011-05-18 MED ORDER — METRONIDAZOLE 500 MG PO TABS
500.0000 mg | ORAL_TABLET | Freq: Once | ORAL | Status: AC
  Administered 2011-05-18: 500 mg via ORAL
  Filled 2011-05-18: qty 1

## 2011-05-18 MED ORDER — MORPHINE SULFATE 2 MG/ML IJ SOLN
2.0000 mg | Freq: Once | INTRAMUSCULAR | Status: AC
  Administered 2011-05-18: 2 mg via INTRAVENOUS
  Filled 2011-05-18: qty 1

## 2011-05-18 MED ORDER — HYDROCODONE-ACETAMINOPHEN 5-325 MG PO TABS: 1.0000 | ORAL_TABLET | ORAL | Status: AC | PRN

## 2011-05-18 MED ORDER — CIPROFLOXACIN HCL 500 MG PO TABS
500.0000 mg | ORAL_TABLET | Freq: Once | ORAL | Status: AC
  Administered 2011-05-18: 500 mg via ORAL
  Filled 2011-05-18: qty 1

## 2011-05-18 MED ORDER — SODIUM CHLORIDE 0.9 % IV BOLUS (SEPSIS)
1000.0000 mL | Freq: Once | INTRAVENOUS | Status: AC
  Administered 2011-05-18: 1000 mL via INTRAVENOUS

## 2011-05-18 MED ORDER — ONDANSETRON HCL 4 MG/2ML IJ SOLN
4.0000 mg | INTRAMUSCULAR | Status: DC | PRN
  Administered 2011-05-18: 4 mg via INTRAVENOUS
  Filled 2011-05-18: qty 2

## 2011-05-18 NOTE — ED Provider Notes (Signed)
History     CSN: [id number]  Arrival date & time 05/18/11  1234   First MD Initiated Contact with Patient 05/18/11 1257      Chief Complaint  Patient presents with  . Abdominal Pain    (Consider location/radiation/quality/duration/timing/severity/associated sxs/prior treatment) HPI Comments: Patient with a history of asthma and diabetes presents emergency department with a chief complaint of abdominal pain.  Onset began on Monday, located in the left lower quadrant, described as dull pain, that does not radiate, is intermittent in nature lasting about 5 minutes, severity 8 now 7/10 but spikes to 10/10.  Patient denies any history of abdominal surgery but has a had a partial hysterectomy for uterine fibroids.  Patient denies being on normal replacement therapy and states that she is compliant with her diabetic medications.  Last normal bowel movement was yesterday and has been in menopause since her partial hysterectomy.  Patient denies associated symptoms including melena, hematochezia, nausea, vomiting, dysuria, abnormal vaginal discharge, chest pain, shortness of breath, cough, fevers, night sweats, chills, change in activity, or change in appetite.  The history is provided by the patient.    Past Medical History  Diagnosis Date  . Diabetes mellitus   . Asthma     History reviewed. No pertinent past surgical history.  History reviewed. No pertinent family history.  History  Substance Use Topics  . Smoking status: Never Smoker   . Smokeless tobacco: Not on file  . Alcohol Use: No    OB History    Grav Para Term Preterm Abortions TAB SAB Ect Mult Living                  Review of Systems  Constitutional: Negative for fever, chills and appetite change.  HENT: Negative for congestion.   Eyes: Negative for visual disturbance.  Respiratory: Negative for shortness of breath.   Cardiovascular: Negative for chest pain and leg swelling.  Gastrointestinal: Negative for  abdominal pain.  Genitourinary: Negative for dysuria, urgency and frequency.  Neurological: Negative for dizziness, syncope, weakness, light-headedness, numbness and headaches.  Psychiatric/Behavioral: Negative for confusion.  All other systems reviewed and are negative.    Allergies  Avapro and Codeine  Home Medications   Current Outpatient Rx  Name Route Sig Dispense Refill  . ASPIRIN 81 MG PO TABS Oral Take 81 mg by mouth daily.      Marland Kitchen GLIMEPIRIDE 4 MG PO TABS Oral Take 4 mg by mouth 2 (two) times daily.    Marland Kitchen METFORMIN HCL 500 MG PO TABS Oral Take 500 mg by mouth 2 (two) times daily with a meal.      . OLMESARTAN MEDOXOMIL 20 MG PO TABS Oral Take 20 mg by mouth daily. Pt is out of this medication    . TRIAMTERENE-HCTZ 37.5-25 MG PO TABS Oral Take 1 tablet by mouth daily.        BP 168/79  Pulse 90  Temp(Src) 98.1 F (36.7 C) (Oral)  Resp 16  SpO2 98%  Physical Exam  Nursing note and vitals reviewed. Constitutional: Vital signs are normal. She appears well-developed and well-nourished. No distress.  HENT:  Head: Normocephalic and atraumatic.  Mouth/Throat: Uvula is midline, oropharynx is clear and moist and mucous membranes are normal.  Eyes: Conjunctivae and EOM are normal. Pupils are equal, round, and reactive to light.  Neck: Normal range of motion and full passive range of motion without pain. Neck supple. No spinous process tenderness and no muscular tenderness present. No rigidity. No  Brudzinski's sign noted.  Cardiovascular: Normal rate and regular rhythm.   Pulmonary/Chest: Effort normal and breath sounds normal. No accessory muscle usage. Not tachypneic. No respiratory distress.  Abdominal: Soft. Normal appearance. She exhibits no distension, no ascites, no pulsatile midline mass and no mass. There is tenderness in the left lower quadrant. There is no CVA tenderness. No hernia.         Obese, ttp in LLQ, normal bowel sounds, no distension or peritoneal signs    Lymphadenopathy:    She has no cervical adenopathy.  Neurological: She is alert.  Skin: Skin is warm and dry. No rash noted. She is not diaphoretic.  Psychiatric: She has a normal mood and affect. Her speech is normal and behavior is normal.    ED Course  Procedures (including critical care time)  Labs Reviewed - No data to display No results found.   No diagnosis found.  1:08 PM  Pt not in room  MDM  LLQ Abdominal Pain  [place] Breath takeover pending CT abd r/o diverticulitis, pt stable and in NAD prior to handoff.        Verl Dicker, [place] 06/01/11 0007

## 2011-05-18 NOTE — ED Notes (Signed)
Reports LLQ pain since Monday, denies any n/v/d or urinary symptoms. No distress noted at triage.

## 2011-05-18 NOTE — ED Provider Notes (Signed)
History     CSN: [id number]  Arrival date & time 05/18/11  1234   First MD Initiated Contact with Patient 05/18/11 1257      Chief Complaint  Patient presents with  . Abdominal Pain    (Consider location/radiation/quality/duration/timing/severity/associated sxs/prior treatment) HPI  Past Medical History  Diagnosis Date  . Diabetes mellitus   . Asthma     History reviewed. No pertinent past surgical history.  History reviewed. No pertinent family history.  History  Substance Use Topics  . Smoking status: Never Smoker   . Smokeless tobacco: Not on file  . Alcohol Use: No    OB History    Grav Para Term Preterm Abortions TAB SAB Ect Mult Living                  Review of Systems  Allergies  Avapro and Codeine  Home Medications   Current Outpatient Rx  Name Route Sig Dispense Refill  . ASPIRIN 81 MG PO TABS Oral Take 81 mg by mouth daily.      Marland Kitchen GLIMEPIRIDE 4 MG PO TABS Oral Take 4 mg by mouth 2 (two) times daily.    Marland Kitchen METFORMIN HCL 500 MG PO TABS Oral Take 500 mg by mouth 2 (two) times daily with a meal.      . OLMESARTAN MEDOXOMIL 20 MG PO TABS Oral Take 20 mg by mouth daily. Pt is out of this medication    . TRIAMTERENE-HCTZ 37.5-25 MG PO TABS Oral Take 1 tablet by mouth daily.        BP 105/74  Pulse 73  Temp(Src) 98.1 F (36.7 C) (Oral)  Resp 16  SpO2 96%  Physical Exam  ED Course  Procedures (including critical care time)  Labs Reviewed  CBC - Abnormal; Notable for the following:    Hemoglobin 11.6 (*)    HCT 35.4 (*)    All other components within normal limits  BASIC METABOLIC PANEL - Abnormal; Notable for the following:    Glucose, Bld 169 (*)    GFR calc non Af Amer 80 (*)    All other components within normal limits  GLUCOSE, CAPILLARY - Abnormal; Notable for the following:    Glucose-Capillary 147 (*)    All other components within normal limits  DIFFERENTIAL  URINALYSIS, ROUTINE W REFLEX MICROSCOPIC  URINE CULTURE   Ct  Abdomen Pelvis W Contrast  05/18/2011  *RADIOLOGY REPORT*  Clinical Data: Left lower quadrant abdominal pain.  CT ABDOMEN AND PELVIS WITH CONTRAST  Technique:  Multidetector CT imaging of the abdomen and pelvis was performed following the standard protocol during bolus administration of intravenous contrast.  Contrast: 116mL OMNIPAQUE IOHEXOL 300 MG/ML  SOLN  Comparison: CT abdomen and pelvis 09/15/2010.  Findings: Minimal pleural thickening is evident posteriorly in the lower lobes bilaterally.  The lungs are otherwise clear.  The heart size is normal.  No significant pleural or pericardial effusion is present.  Fatty infiltration of the liver is again seen.  No focal hepatic lesions are evident.  The spleen is unremarkable.  The stomach, duodenum, and pancreas are within normal limits.  The common bile duct and gallbladder are normal.  The adrenal glands are normal bilaterally.  The kidneys are unremarkable.  The ureters and urinary bladder are within normal limits.  The rectosigmoid colon demonstrates diffuse diverticular change. This extends into the entire descending portions of the transverse colon.  There is no focal inflammation to suggest diverticulitis. The appendix is visualized and normal.  The small bowel is within normal limits.  A prominent low ventral abdominal wall hernia contains multiple locules of fat.  There is no herniated bowel and no obstruction.  The bone windows demonstrate mild degenerative changes in the lower lumbar spine.  No focal lytic or blastic lesions are evident.  IMPRESSION:  1.  Stable appearance of ventral abdominal wall hernia contains multiple locules of fat, but no bowel. 2.  Diffuse fatty infiltration of the liver is stable. 3.  Sigmoid diverticulosis without evidence for diverticulitis.  Original Report Authenticated By: Resa Miner. MATTERN, M.D.     No diagnosis found.    MDM  Care of patient taken over from L. Paz- PA-C, please see her note for HPI, ROS and  PE.  Patient here with 3 day history of LLQ abdominal pain.  CT scan reveals ventral hernia, diverticulosis and diffuse fatty infiltration of the liver.  Patient states that her pain has lessened over the past several days and is mild now - I suspect this to have been diverticulitis clinically though CT does not show this.  I will start the patient on cipro and flagyl.  She will follow up with her PCP for any worsening of symptoms.        Idalia Needle [place], [place] 05/18/11 1713

## 2011-05-18 NOTE — Discharge Instructions (Signed)
Diverticulosis Diverticulosis is a common condition that develops when small pouches (diverticula) form in the wall of the colon. The risk of diverticulosis increases with age. It happens more often in people who eat a low-fiber diet. Most individuals with diverticulosis have no symptoms. Those individuals with symptoms usually experience abdominal pain, constipation, or loose stools (diarrhea). HOME CARE INSTRUCTIONS   Increase the amount of fiber in your diet as directed by your caregiver or dietician. This may reduce symptoms of diverticulosis.   Your caregiver may recommend taking a dietary fiber supplement.   Drink at least 6 to 8 glasses of water each day to prevent constipation.   Try not to strain when you have a bowel movement.   Your caregiver may recommend avoiding nuts and seeds to prevent complications, although this is still an uncertain benefit.   Only take over-the-counter or prescription medicines for pain, discomfort, or fever as directed by your caregiver.  FOODS WITH HIGH FIBER CONTENT INCLUDE:  Fruits. Apple, peach, pear, tangerine, raisins, prunes.   Vegetables. Brussels sprouts, asparagus, broccoli, cabbage, carrot, cauliflower, romaine lettuce, spinach, summer squash, tomato, winter squash, zucchini.   Starchy Vegetables. Baked beans, kidney beans, lima beans, split peas, lentils, potatoes (with skin).   Grains. Whole wheat bread, brown rice, bran flake cereal, plain oatmeal, white rice, shredded wheat, bran muffins.  SEEK IMMEDIATE MEDICAL CARE IF:   You develop increasing pain or severe bloating.   You have an oral temperature above 102 F (38.9 C), not controlled by medicine.   You develop vomiting or bowel movements that are bloody or black.  Document Released: 10/08/2003 Document Revised: 12/30/2010 Document Reviewed: 06/10/2009 [facility] Patient Information 2012 [place].

## 2011-05-19 LAB — URINE CULTURE
Colony Count: NO GROWTH
Culture  Setup Time: 201304241342
Culture: NO GROWTH

## 2011-05-21 NOTE — ED Provider Notes (Signed)
Medical screening examination/treatment/procedure(s) were performed by non-physician practitioner and as supervising physician I was immediately available for consultation/collaboration.  Jasper Riling. Alvino Chapel, MD 05/21/11 [phone]

## 2011-05-26 ENCOUNTER — Ambulatory Visit
Admission: RE | Admit: 2011-05-26 | Discharge: 2011-05-26 | Disposition: A | Discharge status: Home / Self Care | Payer: Medicare Other | Source: Ambulatory Visit | Attending: Family Medicine | Admitting: Family Medicine

## 2011-05-26 DIAGNOSIS — Z1231 Encounter for screening mammogram for malignant neoplasm of breast: Secondary | ICD-10-CM | POA: Diagnosis not present

## 2011-06-01 NOTE — ED Provider Notes (Signed)
Medical screening examination/treatment/procedure(s) were performed by non-physician practitioner and as supervising physician I was immediately available for consultation/collaboration.  Jasper Riling. Alvino Chapel, MD 06/01/11 [phone]

## 2011-06-07 ENCOUNTER — Encounter: Payer: Self-pay | Admitting: Obstetrics and Gynecology

## 2011-06-22 DIAGNOSIS — K5730 Diverticulosis of large intestine without perforation or abscess without bleeding: Secondary | ICD-10-CM | POA: Diagnosis not present

## 2011-06-22 DIAGNOSIS — Z1211 Encounter for screening for malignant neoplasm of colon: Secondary | ICD-10-CM | POA: Diagnosis not present

## 2011-06-22 DIAGNOSIS — K648 Other hemorrhoids: Secondary | ICD-10-CM | POA: Diagnosis not present

## 2011-06-22 DIAGNOSIS — K5900 Constipation, unspecified: Secondary | ICD-10-CM | POA: Diagnosis not present

## 2011-07-08 ENCOUNTER — Telehealth: Payer: Self-pay | Admitting: Obstetrics and Gynecology

## 2011-07-08 ENCOUNTER — Encounter: Payer: Self-pay | Admitting: Obstetrics and Gynecology

## 2011-07-08 ENCOUNTER — Ambulatory Visit (INDEPENDENT_AMBULATORY_CARE_PROVIDER_SITE_OTHER): Payer: Medicare Other | Admitting: Obstetrics and Gynecology

## 2011-07-08 VITALS — BP 120/70 | HR 74 | Wt 226.0 lb

## 2011-07-08 DIAGNOSIS — B373 Candidiasis of vulva and vagina: Secondary | ICD-10-CM

## 2011-07-08 DIAGNOSIS — N898 Other specified noninflammatory disorders of vagina: Secondary | ICD-10-CM

## 2011-07-08 DIAGNOSIS — N899 Noninflammatory disorder of vagina, unspecified: Secondary | ICD-10-CM | POA: Diagnosis not present

## 2011-07-08 DIAGNOSIS — IMO0002 Reserved for concepts with insufficient information to code with codable children: Secondary | ICD-10-CM | POA: Insufficient documentation

## 2011-07-08 DIAGNOSIS — Z719 Counseling, unspecified: Secondary | ICD-10-CM

## 2011-07-08 LAB — POCT WET PREP (WET MOUNT)
Whiff Test: NEGATIVE
pH: 4.5

## 2011-07-08 MED ORDER — CLOTRIMAZOLE-BETAMETHASONE 1-0.05 % EX CREA: TOPICAL_CREAM | Freq: Two times a day (BID) | CUTANEOUS | Status: AC

## 2011-07-08 MED ORDER — FLUCONAZOLE 150 MG PO TABS: 150.0000 mg | ORAL_TABLET | Freq: Once | ORAL | Status: AC

## 2011-07-08 NOTE — Patient Instructions (Addendum)
Avoid: - excess soap on genital area (consider using plain oatmeal soap) - use of powder or sprays in genital area - douching - wearing underwear to bed (except with menses) - using more than is directed detergent when washing clothes - tight fitting garments around genital area - excess sugar intake   

## 2011-07-08 NOTE — Progress Notes (Signed)
56 YO complains of vaginal itching x 2 days. Had intercourse for the first time in a long time this past weekend and since then has been itchy.  O: Pelvic:  EGBUS-mild atrophy, vagina-mild atrophy, uterus/cervix-surgically absent, adnexae-no tenderness  Wet Prep: pH-4.5  whiff-negative  yeast  A: Yeast Vaginitis  P: Diflucan 150mg  #1 1 po stat 1 refill        Clotrimazole/Betametasone Cream #30 grams apply to       affected area bid x 7-14 days 1 refill       Perineal hygiene  Evanna Washinton, PA-C

## 2011-07-08 NOTE — Progress Notes (Signed)
Color: no discharge;just itching Odor: no Itching:yes Thin:no Thick:no Fever:no Dyspareunia:no Hx PID:no HX STD:yes Pelvic Pain:no Desires Gc/CT:no Desires HIV,RPR,HbsAG:no

## 2011-08-10 DIAGNOSIS — I10 Essential (primary) hypertension: Secondary | ICD-10-CM | POA: Diagnosis not present

## 2011-08-10 DIAGNOSIS — E7800 Pure hypercholesterolemia, unspecified: Secondary | ICD-10-CM | POA: Diagnosis not present

## 2011-08-10 DIAGNOSIS — IMO0001 Reserved for inherently not codable concepts without codable children: Secondary | ICD-10-CM | POA: Diagnosis not present

## 2011-08-22 ENCOUNTER — Telehealth: Payer: Self-pay | Admitting: Obstetrics and Gynecology

## 2011-08-23 ENCOUNTER — Ambulatory Visit (INDEPENDENT_AMBULATORY_CARE_PROVIDER_SITE_OTHER): Payer: Medicare Other | Admitting: Obstetrics and Gynecology

## 2011-08-23 ENCOUNTER — Encounter: Payer: Self-pay | Admitting: Obstetrics and Gynecology

## 2011-08-23 VITALS — BP 128/70 | Temp 99.0°F | Wt 227.0 lb

## 2011-08-23 DIAGNOSIS — B373 Candidiasis of vulva and vagina: Secondary | ICD-10-CM | POA: Diagnosis not present

## 2011-08-23 DIAGNOSIS — N898 Other specified noninflammatory disorders of vagina: Secondary | ICD-10-CM

## 2011-08-23 DIAGNOSIS — B3731 Acute candidiasis of vulva and vagina: Secondary | ICD-10-CM

## 2011-08-23 LAB — POCT WET PREP (WET MOUNT)
Whiff Test: NEGATIVE
pH: 4.5

## 2011-08-23 MED ORDER — FLUCONAZOLE 150 MG PO TABS: 150.0000 mg | ORAL_TABLET | Freq: Once | ORAL | Status: DC

## 2011-08-23 MED ORDER — FLUCONAZOLE 150 MG PO TABS: ORAL_TABLET | ORAL | Status: DC

## 2011-08-23 NOTE — Progress Notes (Signed)
56 YO with uncontrolled diabetes and seen last month for yeast vaginitis complains of vaginal itching and discharge.  O:  Pelvic: EGBUS-atrophic with mild erythema, vagina-atrophic with thin white discharge, uterus and cervix surgically absent  Wet Prep: pH-4.5 whiff-negative  yeast-moderate  A: Yeast Vaginiis  P: Diflucan 150 mg #2 1 po stat x 2 days;  may repeat in 3 days if needed       Desitin Cream to external vaginal area 3 times weekly       Discussed the possible use of estrogen vaginal cream to fortify vaginal tissue-declines for now      Encouraged to get sugar under control      RTO-as scheduled or prn  [name], PA-C

## 2011-08-23 NOTE — Progress Notes (Signed)
Color: no color Odor: no Itching:yes Thin:no Thick:no Fever:no Dyspareunia:no Hx PID:no HX STD:yes Pelvic Pain:no Desires Gc/CT:no Desires HIV,RPR,HbsAG:no

## 2011-08-23 NOTE — Patient Instructions (Signed)
Buy Desitin Cream three times a week.

## 2011-12-19 DIAGNOSIS — H2510 Age-related nuclear cataract, unspecified eye: Secondary | ICD-10-CM | POA: Diagnosis not present

## 2011-12-19 DIAGNOSIS — E11311 Type 2 diabetes mellitus with unspecified diabetic retinopathy with macular edema: Secondary | ICD-10-CM | POA: Diagnosis not present

## 2012-04-12 DIAGNOSIS — N760 Acute vaginitis: Secondary | ICD-10-CM | POA: Diagnosis not present

## 2012-05-04 ENCOUNTER — Other Ambulatory Visit: Payer: Self-pay

## 2012-05-04 DIAGNOSIS — Z1231 Encounter for screening mammogram for malignant neoplasm of breast: Secondary | ICD-10-CM

## 2012-05-15 DIAGNOSIS — E11311 Type 2 diabetes mellitus with unspecified diabetic retinopathy with macular edema: Secondary | ICD-10-CM | POA: Diagnosis not present

## 2012-05-15 DIAGNOSIS — H25019 Cortical age-related cataract, unspecified eye: Secondary | ICD-10-CM | POA: Diagnosis not present

## 2012-05-15 DIAGNOSIS — H01029 Squamous blepharitis unspecified eye, unspecified eyelid: Secondary | ICD-10-CM | POA: Diagnosis not present

## 2012-05-15 DIAGNOSIS — H35359 Cystoid macular degeneration, unspecified eye: Secondary | ICD-10-CM | POA: Diagnosis not present

## 2012-05-16 DIAGNOSIS — Z124 Encounter for screening for malignant neoplasm of cervix: Secondary | ICD-10-CM | POA: Diagnosis not present

## 2012-05-16 DIAGNOSIS — K649 Unspecified hemorrhoids: Secondary | ICD-10-CM | POA: Diagnosis not present

## 2012-05-16 DIAGNOSIS — R87612 Low grade squamous intraepithelial lesion on cytologic smear of cervix (LGSIL): Secondary | ICD-10-CM | POA: Diagnosis not present

## 2012-05-28 ENCOUNTER — Ambulatory Visit
Admission: RE | Admit: 2012-05-28 | Discharge: 2012-05-28 | Disposition: A | Discharge status: Home / Self Care | Payer: Medicare Other | Source: Ambulatory Visit

## 2012-05-28 DIAGNOSIS — Z1231 Encounter for screening mammogram for malignant neoplasm of breast: Secondary | ICD-10-CM

## 2012-05-30 DIAGNOSIS — E7800 Pure hypercholesterolemia, unspecified: Secondary | ICD-10-CM | POA: Diagnosis not present

## 2012-05-30 DIAGNOSIS — I10 Essential (primary) hypertension: Secondary | ICD-10-CM | POA: Diagnosis not present

## 2012-05-30 DIAGNOSIS — G609 Hereditary and idiopathic neuropathy, unspecified: Secondary | ICD-10-CM | POA: Diagnosis not present

## 2012-05-30 DIAGNOSIS — IMO0001 Reserved for inherently not codable concepts without codable children: Secondary | ICD-10-CM | POA: Diagnosis not present

## 2012-06-07 ENCOUNTER — Encounter (INDEPENDENT_AMBULATORY_CARE_PROVIDER_SITE_OTHER): Payer: Self-pay | Admitting: Ophthalmology

## 2012-06-11 ENCOUNTER — Encounter (INDEPENDENT_AMBULATORY_CARE_PROVIDER_SITE_OTHER): Payer: Medicaid Other | Admitting: Ophthalmology

## 2012-06-11 DIAGNOSIS — E1165 Type 2 diabetes mellitus with hyperglycemia: Secondary | ICD-10-CM | POA: Diagnosis not present

## 2012-06-11 DIAGNOSIS — E11319 Type 2 diabetes mellitus with unspecified diabetic retinopathy without macular edema: Secondary | ICD-10-CM | POA: Diagnosis not present

## 2012-06-11 DIAGNOSIS — H2510 Age-related nuclear cataract, unspecified eye: Secondary | ICD-10-CM

## 2012-06-11 DIAGNOSIS — E1139 Type 2 diabetes mellitus with other diabetic ophthalmic complication: Secondary | ICD-10-CM

## 2012-06-11 DIAGNOSIS — I10 Essential (primary) hypertension: Secondary | ICD-10-CM | POA: Diagnosis not present

## 2012-06-11 DIAGNOSIS — H43819 Vitreous degeneration, unspecified eye: Secondary | ICD-10-CM

## 2012-06-11 DIAGNOSIS — H35039 Hypertensive retinopathy, unspecified eye: Secondary | ICD-10-CM | POA: Diagnosis not present

## 2012-07-16 DIAGNOSIS — L0233 Carbuncle of buttock: Secondary | ICD-10-CM | POA: Diagnosis not present

## 2012-07-16 DIAGNOSIS — B373 Candidiasis of vulva and vagina: Secondary | ICD-10-CM | POA: Diagnosis not present

## 2012-11-20 DIAGNOSIS — Z23 Encounter for immunization: Secondary | ICD-10-CM | POA: Diagnosis not present

## 2012-12-11 DIAGNOSIS — Z0000 Encounter for general adult medical examination without abnormal findings: Secondary | ICD-10-CM | POA: Diagnosis not present

## 2013-02-21 DIAGNOSIS — M25519 Pain in unspecified shoulder: Secondary | ICD-10-CM | POA: Diagnosis not present

## 2013-04-17 DIAGNOSIS — G609 Hereditary and idiopathic neuropathy, unspecified: Secondary | ICD-10-CM | POA: Diagnosis not present

## 2013-04-17 DIAGNOSIS — I10 Essential (primary) hypertension: Secondary | ICD-10-CM | POA: Diagnosis not present

## 2013-04-17 DIAGNOSIS — IMO0001 Reserved for inherently not codable concepts without codable children: Secondary | ICD-10-CM | POA: Diagnosis not present

## 2013-04-17 DIAGNOSIS — E7800 Pure hypercholesterolemia, unspecified: Secondary | ICD-10-CM | POA: Diagnosis not present

## 2013-04-19 DIAGNOSIS — E7800 Pure hypercholesterolemia, unspecified: Secondary | ICD-10-CM | POA: Diagnosis not present

## 2013-04-19 DIAGNOSIS — IMO0001 Reserved for inherently not codable concepts without codable children: Secondary | ICD-10-CM | POA: Diagnosis not present

## 2013-04-19 LAB — BASIC METABOLIC PANEL: Creatinine: 1.2 mg/dL — AB (ref <=1.1)

## 2013-04-19 LAB — LIPID PANEL: LDL Cholesterol: 180 mg/dL

## 2013-05-10 ENCOUNTER — Other Ambulatory Visit: Payer: Self-pay

## 2013-05-10 DIAGNOSIS — Z1231 Encounter for screening mammogram for malignant neoplasm of breast: Secondary | ICD-10-CM

## 2013-05-22 DIAGNOSIS — IMO0001 Reserved for inherently not codable concepts without codable children: Secondary | ICD-10-CM | POA: Diagnosis not present

## 2013-05-29 ENCOUNTER — Ambulatory Visit
Admission: RE | Admit: 2013-05-29 | Discharge: 2013-05-29 | Disposition: A | Discharge status: Home / Self Care | Payer: Medicare Other | Source: Ambulatory Visit

## 2013-05-29 ENCOUNTER — Encounter (INDEPENDENT_AMBULATORY_CARE_PROVIDER_SITE_OTHER): Payer: Self-pay

## 2013-05-29 DIAGNOSIS — Z1231 Encounter for screening mammogram for malignant neoplasm of breast: Secondary | ICD-10-CM | POA: Diagnosis not present

## 2013-07-05 DIAGNOSIS — IMO0001 Reserved for inherently not codable concepts without codable children: Secondary | ICD-10-CM | POA: Diagnosis not present

## 2013-07-05 LAB — HEMOGLOBIN A1C: Hgb A1c MFr Bld: 9.2 % — AB (ref 4.0–6.0)

## 2013-07-10 ENCOUNTER — Ambulatory Visit (INDEPENDENT_AMBULATORY_CARE_PROVIDER_SITE_OTHER): Payer: Medicare Other | Admitting: Ophthalmology

## 2013-07-10 DIAGNOSIS — H35039 Hypertensive retinopathy, unspecified eye: Secondary | ICD-10-CM

## 2013-07-10 DIAGNOSIS — E11319 Type 2 diabetes mellitus with unspecified diabetic retinopathy without macular edema: Secondary | ICD-10-CM

## 2013-07-10 DIAGNOSIS — E1139 Type 2 diabetes mellitus with other diabetic ophthalmic complication: Secondary | ICD-10-CM | POA: Diagnosis not present

## 2013-07-10 DIAGNOSIS — E1165 Type 2 diabetes mellitus with hyperglycemia: Secondary | ICD-10-CM

## 2013-07-10 DIAGNOSIS — H43819 Vitreous degeneration, unspecified eye: Secondary | ICD-10-CM

## 2013-07-10 DIAGNOSIS — H3581 Retinal edema: Secondary | ICD-10-CM

## 2013-07-10 DIAGNOSIS — I10 Essential (primary) hypertension: Secondary | ICD-10-CM | POA: Diagnosis not present

## 2013-07-22 DIAGNOSIS — Z124 Encounter for screening for malignant neoplasm of cervix: Secondary | ICD-10-CM | POA: Diagnosis not present

## 2013-07-22 DIAGNOSIS — Z01419 Encounter for gynecological examination (general) (routine) without abnormal findings: Secondary | ICD-10-CM | POA: Diagnosis not present

## 2013-07-31 ENCOUNTER — Other Ambulatory Visit (INDEPENDENT_AMBULATORY_CARE_PROVIDER_SITE_OTHER): Payer: Medicare Other | Admitting: Ophthalmology

## 2013-07-31 DIAGNOSIS — E1139 Type 2 diabetes mellitus with other diabetic ophthalmic complication: Secondary | ICD-10-CM | POA: Diagnosis not present

## 2013-07-31 DIAGNOSIS — E1165 Type 2 diabetes mellitus with hyperglycemia: Secondary | ICD-10-CM

## 2013-07-31 DIAGNOSIS — H3581 Retinal edema: Secondary | ICD-10-CM | POA: Diagnosis not present

## 2013-08-05 DIAGNOSIS — R58 Hemorrhage, not elsewhere classified: Secondary | ICD-10-CM | POA: Diagnosis not present

## 2013-08-05 DIAGNOSIS — R109 Unspecified abdominal pain: Secondary | ICD-10-CM | POA: Diagnosis not present

## 2013-08-26 DIAGNOSIS — R58 Hemorrhage, not elsewhere classified: Secondary | ICD-10-CM | POA: Diagnosis not present

## 2013-09-13 ENCOUNTER — Encounter: Payer: Self-pay | Admitting: Endocrinology

## 2013-09-13 ENCOUNTER — Ambulatory Visit (INDEPENDENT_AMBULATORY_CARE_PROVIDER_SITE_OTHER): Payer: Medicare Other | Admitting: Endocrinology

## 2013-09-13 VITALS — BP 144/88 | HR 100 | Temp 97.8°F | Resp 16 | Ht 63.0 in | Wt 240.0 lb

## 2013-09-13 DIAGNOSIS — E1339 Other specified diabetes mellitus with other diabetic ophthalmic complication: Secondary | ICD-10-CM

## 2013-09-13 DIAGNOSIS — I10 Essential (primary) hypertension: Secondary | ICD-10-CM | POA: Insufficient documentation

## 2013-09-13 DIAGNOSIS — E1169 Type 2 diabetes mellitus with other specified complication: Secondary | ICD-10-CM | POA: Insufficient documentation

## 2013-09-13 DIAGNOSIS — E11319 Type 2 diabetes mellitus with unspecified diabetic retinopathy without macular edema: Secondary | ICD-10-CM | POA: Insufficient documentation

## 2013-09-13 DIAGNOSIS — E08319 Diabetes mellitus due to underlying condition with unspecified diabetic retinopathy without macular edema: Secondary | ICD-10-CM

## 2013-09-13 DIAGNOSIS — E1159 Type 2 diabetes mellitus with other circulatory complications: Secondary | ICD-10-CM | POA: Insufficient documentation

## 2013-09-13 DIAGNOSIS — E785 Hyperlipidemia, unspecified: Secondary | ICD-10-CM | POA: Insufficient documentation

## 2013-09-13 DIAGNOSIS — E1165 Type 2 diabetes mellitus with hyperglycemia: Principal | ICD-10-CM

## 2013-09-13 DIAGNOSIS — IMO0001 Reserved for inherently not codable concepts without codable children: Secondary | ICD-10-CM | POA: Diagnosis not present

## 2013-09-13 DIAGNOSIS — E1129 Type 2 diabetes mellitus with other diabetic kidney complication: Secondary | ICD-10-CM | POA: Insufficient documentation

## 2013-09-13 LAB — GLUCOSE, POCT (MANUAL RESULT ENTRY): POC Glucose: 260 mg/dl — AB (ref 70–99)

## 2013-09-13 MED ORDER — METFORMIN HCL 1000 MG PO TABS: 1000.0000 mg | ORAL_TABLET | Freq: Two times a day (BID) | ORAL | Status: DC

## 2013-09-13 MED ORDER — ATORVASTATIN CALCIUM 10 MG PO TABS: 10.0000 mg | ORAL_TABLET | Freq: Every day | ORAL | Status: DC

## 2013-09-13 MED ORDER — CANAGLIFLOZIN 100 MG PO TABS: 100.0000 mg | ORAL_TABLET | Freq: Every day | ORAL | Status: DC

## 2013-09-13 NOTE — Patient Instructions (Addendum)
Please check blood sugars at least half the time about 2 hours after any meal and 3 times per week on waking up.  Please bring blood sugar monitor to each visit  Invokana 1 in am before am meal  Metformin 1 in am and 2 at dinner and next week 2 twice daily  Leave off triamterene -Hctz for now, call if having swelling  Exercise regularly at least 2-4 times a week

## 2013-09-13 NOTE — Progress Notes (Signed)
Patient ID: Stephanie [name], female   DOB: [birth year], 58 y.o.   MRN: [id number]           Reason for Appointment: Consultation for Type 2 Diabetes  Referring physician: Hulan Fess  History of Present Illness:          Diagnosis: Type 2 diabetes mellitus, date of diagnosis: 2003       Past history:  She was initially treated metformin at some point glimepiride also added and not clear what her level of control was in the first few years. Had been only taking 500 mg twice a day of metformin and Amaryl increased to 4 mg twice a day several years ago In the last few years her blood sugar control has been more difficult and she has been managed by an endocrinologist Because of poor control she had been tried on [place] and [place] but she states that she could not tolerate them because of headaches She does not think she has been given any other medications   Recent history:  She was last seen by her endocrinologist in 6/15 and despite persistently high A1c she was reluctant to start any other medications besides her Amaryl and metformin. She is now here for establishing care She checks her blood sugar only in the morning, did not bring her monitor for download. She thinks her blood sugars are usually about 160 but her lab glucose readings are consistently over 200 For the last couple of years she has gained about 15 pounds of weight       Oral hypoglycemic drugs the patient is taking are: Amaryl 4 mg twice a day, metformin 500 mg twice a day      Side effects from medications have been: Januvia, Onglyza headaches Compliance with the medical regimen: Fair  Hypoglycemia: none  Glucose monitoring:  done one time a day         Glucometer: One Touch Ultra.      Blood Glucose readings by recall, glucose is usually around 160, higher today, 294  PREMEAL Breakfast Lunch Dinner Bedtime  Overall   Glucose range: 160-294      Median:        Glycemic control:   Lab Results  Component Value  Date   HGBA1C 9.2* 07/05/2013   Lab Results  Component Value Date   LDLCALC 180 04/19/2013   CREATININE 1.2* 04/19/2013   Last urine microalbumin/creatinine ratio 251 in 3/15  Retinal exam: Most recent:7/15.    Self-care: The diet that the patient has been following is: tries to limit fats.      Meals: 3 meals per day. Breakfast is cereal or scrambled eggs. Lunch is half sandwich, yogurt and fruit; dinner usually baked chicken, bread and vegetables, snacks are fruits or nuts. Not eating out regularly Exercise:  walking video 30 min, 3/7 days a week, irregular with this program         Dietician visit: Most recent: Several years ago.               Weight history: Previous films 200-260  Wt Readings from Last 3 Encounters:  09/13/13 240 lb (108.863 kg)  08/23/11 227 lb (102.967 kg)  07/08/11 226 lb (102.513 kg)       Medication List       This list is accurate as of: 09/13/13 11:23 AM.  Always use your most recent med list.               aspirin 81  MG tablet  Take 81 mg by mouth daily.     glimepiride 4 MG tablet  Commonly known as:  AMARYL  Take 4 mg by mouth 2 (two) times daily.     Green Tea 250 MG Caps  Take by mouth.     hydrocortisone valerate cream 0.2 %  Commonly known as:  WESTCORT     losartan 50 MG tablet  Commonly known as:  COZAAR     metFORMIN 500 MG tablet  Commonly known as:  GLUCOPHAGE  Take 500 mg by mouth 2 (two) times daily with a meal.     triamterene-hydrochlorothiazide 37.5-25 MG per tablet  Commonly known as:  MAXZIDE-25  Take 1 tablet by mouth daily.        Allergies:  Allergies  Allergen Reactions  . Avapro [Irbesartan] Other (See Comments)    headaches  . Codeine Nausea And Vomiting    Past Medical History  Diagnosis Date  . Diabetes mellitus   . Asthma   . Hypertension   . Abnormal Pap smear   . LGSIL (low grade squamous intraepithelial dysplasia)     Past Surgical History  Procedure Laterality Date  . Abdominal  hysterectomy    . Bladder surgery    . Colposcopy      Family History  Problem Relation Age of Onset  . Diabetes Mother   . Diabetes Maternal Grandmother     Social History:  reports that she has never smoked. She has never used smokeless tobacco. She reports that she does not drink alcohol or use illicit drugs.    Review of Systems       Lipids: She has not been treated with any medications, LDL has been about 180 in the last year       No results found for this basename: CHOL,  HDL,  LDLCALC,  LDLDIRECT,  TRIG,  CHOLHDL                  Skin: No rash or infections     Thyroid:  No  unusual fatigue.     The blood pressure has been treated only with Maxzide     No history of renal dysfunction      She has had swelling of feet and lower legs, more on standing.     No shortness of breath or chest tightness  on exertion.     Bowel habits: Normal.       No history of urinary tract infections. Occasionally has had vaginal candidiasis      No joint  pains.          She does have a history of mild Numbness but no tingling or burning in feet      Physical Examination:  BP 144/88  Pulse 100  Temp(Src) 97.8 F (36.6 C)  Resp 16  Ht 5\' 3"  (1.6 m)  Wt 240 lb (108.863 kg)  BMI 42.52 kg/m2  SpO2 95%  GENERAL:         Patient has generalized obesity.   HEENT:         Eye exam shows normal external appearance. Fundus exam shows no retinopathy. Oral exam shows normal mucosa .  NECK:         General:  Neck exam shows no lymphadenopathy. Carotids are normal to palpation and no bruit heard.  Thyroid is not enlarged and no nodules felt.   LUNGS:         Chest is symmetrical. Lungs are  clear to auscultation.Marland Kitchen   HEART:         Heart sounds:  S1 and S2 are normal. No murmurs or clicks heard., no S3 or S4.   ABDOMEN:   There is no distention present. Liver and spleen are not palpable. No other mass or tenderness present.  EXTREMITIES:     There is 1+ ankle edema. No skin lesions  present.Marland Kitchen  NEUROLOGICAL:   Vibration sense is moderately reduced in toes. Ankle jerks are absent bilaterally.          Diabetic foot exam:  Diabetic foot exam shows normal monofilament sensation in the toes and plantar surfaces, no skin lesions or ulcers on the feet and normal pedal pulses MUSCULOSKELETAL:       There is no enlargement or deformity of the joints. Spine is normal to inspection.Marland Kitchen   SKIN:       No rash or lesions      ASSESSMENT:  Diabetes type 2, uncontrolled    She has had a persistently high A1c of 9-10% over the last year This indicates progression of her diabetes and failure of current treatment with metformin and glimepiride She also is taking only 1000 mg of metformin daily, renal function is normal She appears to have poor understanding of diabetes and has been reluctant to change her medication regimen or consider alternatives such as injectable medications Also not clear if she is getting reliable home readings since she claims her fasting readings are mostly around 160 despite her A1c being over 9% consistently; also not checking any readings postprandially She has had difficulty losing weight although she has a reasonably good diet and does some exercise Discussed with patient in detail that she has need for improved control especially with her other day having complications of diabetes including retinopathy, microalbuminuria  Complications: Proliferative retinopathy, microalbuminuria  Hyperlipidemia: She has had significant hypercholesterolemia with LDL about 180 but has not been treated with a statin drug. Discussed need for treatment with statins and benefits in reducing cardiovascular events especially with her multiple risk factors  Microalbuminuria and probable hypertension: Currently treated with only low-dose losartan microalbumin will need to be checked again when blood sugars better controlled  Leg edema: This may be partly related to venous insufficiency  but also may have mild nephrotic syndrome  PLAN:  Discussed action of SGLT 2 drugs on lowering glucose by decreasing kidney absorption of glucose, benefits of weight loss and lower blood pressure, possible side effects including candidiasis and dosage regimen. Will start her on Invokana 100 mg daily. For now will have her leave off her diuretic she will have some diuresis with Invokana and because of the potential for hyperkalemia. Baseline metabolic panel to be done today  Start checking blood sugars more consistently after meals and bring monitor for download. Consider changing her monitor if it is inaccurate Increase metformin by 500 mg for one week and then to a dose of 1 g twice a day   Discussed briefly the possibility of using a GLP-1 drug and showed her the Victoza pen.  Also discussed with the patient the likelihood of going on insulin at some point because of progression of her diabetes  Consultation with diabetes educator  Exercise regularly at least 2-4 times a week  Lipitor 10 mg daily  Counseling time over 50% of today's 60 minute visit   Lamount Bankson 09/13/2013, 11:23 AM   Note: This office note was prepared with Estate agent. Any transcriptional errors that  result from this process are unintentional.  Labs:  Office Visit on 09/13/2013  Component Date Value Ref Range Status  . LDL Cholesterol 04/19/2013 180   Final  . Hemoglobin A1C 07/05/2013 9.2* 4.0 - 6.0 % Final  . Creatinine 04/19/2013 1.2* .5 - 1.1 mg/dL Final  . POC Glucose 09/13/2013 260* 70 - 99 mg/dl Final  . Sodium 09/13/2013 135  135 - 145 mEq/L Final  . Potassium 09/13/2013 4.9  3.5 - 5.1 mEq/L Final  . Chloride 09/13/2013 97  96 - 112 mEq/L Final  . CO2 09/13/2013 25  19 - 32 mEq/L Final  . Glucose, Bld 09/13/2013 238* 70 - 99 mg/dL Final  . BUN 09/13/2013 25* 6 - 23 mg/dL Final  . Creatinine, Ser 09/13/2013 1.1  0.4 - 1.2 mg/dL Final  . Total Bilirubin 09/13/2013 0.5   0.2 - 1.2 mg/dL Final  . Alkaline Phosphatase 09/13/2013 71  39 - 117 U/L Final  . AST 09/13/2013 24  0 - 37 U/L Final  . ALT 09/13/2013 21  0 - 35 U/L Final  . Total Protein 09/13/2013 8.2  6.0 - 8.3 g/dL Final  . Albumin 09/13/2013 3.7  3.5 - 5.2 g/dL Final  . Calcium 09/13/2013 10.3  8.4 - 10.5 mg/dL Final  . GFR 09/13/2013 62.54  >60.00 mL/min Final

## 2013-09-14 LAB — COMPREHENSIVE METABOLIC PANEL
ALT: 21 U/L (ref 0–35)
AST: 24 U/L (ref 0–37)
Albumin: 3.7 g/dL (ref 3.5–5.2)
Alkaline Phosphatase: 71 U/L (ref 39–117)
BUN: 25 mg/dL — ABNORMAL HIGH (ref 6–23)
CO2: 25 mEq/L (ref 19–32)
Calcium: 10.3 mg/dL (ref 8.4–10.5)
Chloride: 97 mEq/L (ref 96–112)
Creatinine, Ser: 1.1 mg/dL (ref 0.4–1.2)
GFR: 62.54 mL/min (ref >60.00)
Glucose, Bld: 238 mg/dL — ABNORMAL HIGH (ref 70–99)
Potassium: 4.9 mEq/L (ref 3.5–5.1)
Sodium: 135 mEq/L (ref 135–145)
Total Bilirubin: 0.5 mg/dL (ref 0.2–1.2)
Total Protein: 8.2 g/dL (ref 6.0–8.3)

## 2013-10-02 DIAGNOSIS — N644 Mastodynia: Secondary | ICD-10-CM | POA: Diagnosis not present

## 2013-10-07 ENCOUNTER — Telehealth: Payer: Self-pay | Admitting: Endocrinology

## 2013-10-07 ENCOUNTER — Other Ambulatory Visit: Payer: Self-pay | Admitting: *Deleted

## 2013-10-07 MED ORDER — FLUCONAZOLE 150 MG PO TABS: 150.0000 mg | ORAL_TABLET | Freq: Once | ORAL | Status: DC

## 2013-10-07 MED ORDER — GLUCOSE BLOOD VI STRP: ORAL_STRIP | Status: DC

## 2013-10-07 NOTE — Telephone Encounter (Signed)
Dr. Cruzita Lederer, is it okay to send in 2 tablets of Diflucan, she said that's how many it takes for her to clear it up? Rx for test strips sent.

## 2013-10-07 NOTE — Telephone Encounter (Signed)
Ok for 2 tabs of 150mg , is her sugars controlled: she cancelled this week's appt.Marland Kitchen

## 2013-10-07 NOTE — Telephone Encounter (Signed)
Patient stated that the Invokana has given her a yeast infection could he send somthing into the pharmacy for and she prefer the tablet, and she also need a prescription for one touch strips.

## 2013-10-07 NOTE — Telephone Encounter (Signed)
Noted, rx sent, patient is aware. 

## 2013-10-08 NOTE — Telephone Encounter (Signed)
Is her sugars controlled? If not need to see her sooner

## 2013-10-08 NOTE — Telephone Encounter (Signed)
Patient said her sugars are down significantly since she started the invokana, she said she's in the 100's but did not have her book or her meter with her.

## 2013-10-11 ENCOUNTER — Ambulatory Visit: Payer: Medicare Other | Admitting: Endocrinology

## 2013-10-28 ENCOUNTER — Ambulatory Visit (INDEPENDENT_AMBULATORY_CARE_PROVIDER_SITE_OTHER): Payer: Medicare Other | Admitting: Endocrinology

## 2013-10-28 ENCOUNTER — Other Ambulatory Visit: Payer: Self-pay | Admitting: *Deleted

## 2013-10-28 ENCOUNTER — Encounter: Payer: Self-pay | Admitting: Endocrinology

## 2013-10-28 VITALS — BP 164/84 | HR 102 | Temp 98.6°F | Resp 16 | Ht 63.0 in | Wt 242.4 lb

## 2013-10-28 DIAGNOSIS — E1169 Type 2 diabetes mellitus with other specified complication: Secondary | ICD-10-CM

## 2013-10-28 DIAGNOSIS — IMO0002 Reserved for concepts with insufficient information to code with codable children: Secondary | ICD-10-CM

## 2013-10-28 DIAGNOSIS — Z23 Encounter for immunization: Secondary | ICD-10-CM

## 2013-10-28 DIAGNOSIS — E785 Hyperlipidemia, unspecified: Secondary | ICD-10-CM

## 2013-10-28 DIAGNOSIS — E1165 Type 2 diabetes mellitus with hyperglycemia: Secondary | ICD-10-CM

## 2013-10-28 LAB — MICROALBUMIN / CREATININE URINE RATIO
Creatinine,U: 97.3 mg/dL
Microalb Creat Ratio: 68.6 mg/g — ABNORMAL HIGH (ref 0.0–30.0)
Microalb, Ur: 66.7 mg/dL — ABNORMAL HIGH (ref 0.0–1.9)

## 2013-10-28 LAB — HEMOGLOBIN A1C: Hgb A1c MFr Bld: 8.6 % — ABNORMAL HIGH (ref 4.6–6.5)

## 2013-10-28 LAB — LIPID PANEL
Cholesterol: 181 mg/dL (ref 0–200)
HDL: 42.8 mg/dL (ref >39.00)
LDL Cholesterol: 111 mg/dL — ABNORMAL HIGH (ref 0–99)
NonHDL: 138.2
Total CHOL/HDL Ratio: 4
Triglycerides: 134 mg/dL (ref 0.0–149.0)
VLDL: 26.8 mg/dL (ref 0.0–40.0)

## 2013-10-28 LAB — BASIC METABOLIC PANEL
BUN: 16 mg/dL (ref 6–23)
CO2: 24 mEq/L (ref 19–32)
Calcium: 9.4 mg/dL (ref 8.4–10.5)
Chloride: 102 mEq/L (ref 96–112)
Creatinine, Ser: 0.8 mg/dL (ref 0.4–1.2)
GFR: 90.16 mL/min (ref >60.00)
Glucose, Bld: 140 mg/dL — ABNORMAL HIGH (ref 70–99)
Potassium: 4.1 mEq/L (ref 3.5–5.1)
Sodium: 137 mEq/L (ref 135–145)

## 2013-10-28 MED ORDER — DULAGLUTIDE 0.75 MG/0.5ML ~~LOC~~ SOAJ: SUBCUTANEOUS | Status: DC

## 2013-10-28 NOTE — Patient Instructions (Signed)
Please check blood sugars at least half the time about 2 hours after any meal and times per week on waking up. Please bring blood sugar monitor to each visit  More exercise  Trulicity weekly and when starting reduce Glimeperide to 1/2 twice days

## 2013-10-28 NOTE — Progress Notes (Signed)
Quick Note:  Call: A1c still high at 8.6. Start Trulicity unless not covered by insurance. Cholesterol improving, continue Lipitor ______

## 2013-10-28 NOTE — Progress Notes (Signed)
Patient ID: Stephanie [name], female   DOB: Aug 13, 1955, 58 y.o.   MRN: [id number]           Reason for Appointment: Followup for Type 2 Diabetes  Referring physician: Hulan Fess  History of Present Illness:          Diagnosis: Type 2 diabetes mellitus, date of diagnosis: 2003       Past history:  She was initially treated metformin at some point glimepiride also added and not clear what her level of control was in the first few years. Had been only taking 500 mg twice a day of metformin and Amaryl increased to 4 mg twice a day several years ago In the last few years her blood sugar control has been more difficult and she has been managed by an endocrinologist Because of poor control she had been tried on Janumet and [place] but she states that she could not tolerate them because of headaches She does not think she has been given any other medications   Recent history:  She had previously been reluctant to add other medications to her metformin and Amaryl despite poor control Also has had difficulty with weight gain Her glucose was 260 in the office on her previous visit She was given a trial of Invokana which appeared to help her blood sugars but because of vaginal candidiasis she did not want to continue and stop this on 10/08/13 She checks her blood sugar only in the morning despite instructions on her last visit to do postprandial readings She has increased her metformin to 1 g twice a day without any side effects She has only 4 readings and most of them are high in the morning       Oral hypoglycemic drugs the patient is taking are: Amaryl 4 mg twice a day, metformin 1000 mg twice a day      Side effects from medications have been: Januvia, Onglyza headaches Compliance with the medical regimen: Fair   Hypoglycemia: none  Glucose monitoring:  done one time a day         Glucometer: One Touch Ultra.      Blood Glucose readings by download shows only 4 readings  PREMEAL Breakfast  Lunch Dinner Bedtime Overall  Glucose range:  88-192       Median:  171      171    Glycemic control:   Lab Results  Component Value Date   HGBA1C 9.2* 07/05/2013   Lab Results  Component Value Date   LDLCALC 180 04/19/2013   CREATININE 1.1 09/13/2013   Last urine microalbumin/creatinine ratio 251 in 3/15  Retinal exam: Most recent:7/15.    Self-care: The diet that the patient has been following is: tries to limit fats.      Meals: 3 meals per day. Breakfast is cereal or scrambled eggs. Lunch is half sandwich, yogurt and fruit; dinner usually baked chicken, bread and vegetables, snacks are fruits or nuts. Not eating out regularly Exercise:  walking video 30 min, 3/7 days a week, irregular with this program         Dietician visit: Most recent: Several years ago.               Weight history: Previous 200-260  Wt Readings from Last 3 Encounters:  10/28/13 242 lb 6.4 oz (109.952 kg)  09/13/13 240 lb (108.863 kg)  08/23/11 227 lb (102.967 kg)       Medication List  This list is accurate as of: 10/28/13  2:14 PM.  Always use your most recent med list.               aspirin 81 MG tablet  Take 81 mg by mouth daily.     atorvastatin 10 MG tablet  Commonly known as:  LIPITOR  Take 1 tablet (10 mg total) by mouth daily.     Canagliflozin 100 MG Tabs  Commonly known as:  INVOKANA  Take 1 tablet (100 mg total) by mouth daily before breakfast.     fluconazole 150 MG tablet  Commonly known as:  DIFLUCAN  Take 1 tablet (150 mg total) by mouth once.     glimepiride 4 MG tablet  Commonly known as:  AMARYL  Take 4 mg by mouth 2 (two) times daily.     glucose blood test strip  Commonly known as:  ONE TOUCH ULTRA TEST  Use as instructed to check blood sugar once a day dx code 250.02     Green Tea 250 MG Caps  Take by mouth.     hydrocortisone valerate cream 0.2 %  Commonly known as:  WESTCORT     losartan 50 MG tablet  Commonly known as:  COZAAR     metFORMIN  1000 MG tablet  Commonly known as:  GLUCOPHAGE  Take 1 tablet (1,000 mg total) by mouth 2 (two) times daily with a meal.     triamterene-hydrochlorothiazide 37.5-25 MG per tablet  Commonly known as:  MAXZIDE-25  Take 1 tablet by mouth daily.        Allergies:  Allergies  Allergen Reactions  . Avapro [Irbesartan] Other (See Comments)    headaches  . Codeine Nausea And Vomiting    Past Medical History  Diagnosis Date  . Diabetes mellitus   . Asthma   . Hypertension   . Abnormal Pap smear   . LGSIL (low grade squamous intraepithelial dysplasia)     Past Surgical History  Procedure Laterality Date  . Abdominal hysterectomy    . Bladder surgery    . Colposcopy      Family History  Problem Relation Age of Onset  . Diabetes Mother   . Diabetes Maternal Grandmother   . Heart disease Neg Hx   . Hypertension Neg Hx     Social History:  reports that she has never smoked. She has never used smokeless tobacco. She reports that she does not drink alcohol or use illicit drugs.    Review of Systems       Lipids: She has recently been started on Lipitor, LDL has been about 180 in the last year       No results found for this basename: CHOL,  HDL,  LDLCALC,  LDLDIRECT,  TRIG,  CHOLHDL                   She does have a history of mild Numbness but no tingling or burning in feet    Diabetic foot exam 0000000  Complications: Proliferative retinopathy, microalbuminuria   Physical Examination:  BP 164/84  Pulse 102  Temp(Src) 98.6 F (37 C)  Resp 16  Ht 5\' 3"  (1.6 m)  Wt 242 lb 6.4 oz (109.952 kg)  BMI 42.95 kg/m2  SpO2 97%      ASSESSMENT:  Diabetes type 2, with obesity Although she appear to have relatively better blood sugars with adding Invokana she refuses to consider this again because of vaginal candidiasis last month She  has not checked her blood sugars as directed and only a few readings in the mornings Still has difficulty losing weight    She has had a  persistently high A1c of 9-10% over the last year  PLAN:  Discussed with the patient the nature of GLP-1 drugs, the action on various organ systems, how they benefit blood glucose control, as well as the benefit of weight loss and  increase satiety . Explained possible side effects especially nausea and vomiting; discussed safety information in package insert. Demonstrated the medication injection device and injection technique to the patient. Discussed injection sites and titration of Trulicity starting with 0.75 mg once a week for 2 weeks and then increasing to 1.5 mg if no symptoms of nausea. Patient brochure on Trulicity given. However if she cannot get this out of her insurance may use Victoza and demonstrated how this would be used Reduce Amaryl to half tablet when starting Trulicity Start postprandial readings at least half the time  Consultation with diabetes educator  Exercise regularly at least 2-4 times a week  Check lipids  Counseling time over 50% of today's 25 minute visit   Mauri Tolen 10/28/2013, 2:14 PM   Note: This office note was prepared with Dragon voice recognition system technology. Any transcriptional errors that result from this process are unintentional.

## 2013-11-11 ENCOUNTER — Ambulatory Visit: Payer: Medicare Other | Admitting: Nutrition

## 2013-11-25 ENCOUNTER — Encounter: Payer: Self-pay | Admitting: Endocrinology

## 2013-11-27 ENCOUNTER — Encounter: Payer: Medicare Other | Attending: Endocrinology | Admitting: Nutrition

## 2013-11-27 ENCOUNTER — Ambulatory Visit (INDEPENDENT_AMBULATORY_CARE_PROVIDER_SITE_OTHER): Payer: Medicare Other | Admitting: Endocrinology

## 2013-11-27 ENCOUNTER — Other Ambulatory Visit: Payer: Self-pay | Admitting: *Deleted

## 2013-11-27 ENCOUNTER — Encounter: Payer: Self-pay | Admitting: Endocrinology

## 2013-11-27 VITALS — BP 158/88 | HR 91 | Temp 98.0°F | Resp 14 | Ht 63.0 in | Wt 228.0 lb

## 2013-11-27 DIAGNOSIS — Z713 Dietary counseling and surveillance: Secondary | ICD-10-CM | POA: Diagnosis not present

## 2013-11-27 DIAGNOSIS — I10 Essential (primary) hypertension: Secondary | ICD-10-CM | POA: Diagnosis not present

## 2013-11-27 DIAGNOSIS — E1165 Type 2 diabetes mellitus with hyperglycemia: Secondary | ICD-10-CM | POA: Diagnosis not present

## 2013-11-27 DIAGNOSIS — E785 Hyperlipidemia, unspecified: Secondary | ICD-10-CM

## 2013-11-27 DIAGNOSIS — IMO0002 Reserved for concepts with insufficient information to code with codable children: Secondary | ICD-10-CM

## 2013-11-27 MED ORDER — GLIMEPIRIDE 4 MG PO TABS: 4.0000 mg | ORAL_TABLET | Freq: Two times a day (BID) | ORAL | Status: DC

## 2013-11-27 NOTE — Patient Instructions (Addendum)
Start Trulicity weekly  Reduce Glimeperide to 1/2 in ams  Walk daily

## 2013-11-27 NOTE — Progress Notes (Signed)
We discussed how this medication works, how and when to take this medication.  She was given a starter kit with directions for how to inject the pen, and how this medication works to lower her blood sugar.  She reported good understanding of how to take this medication, and had no final questions.  It was suggested that she test her blood sugars both before and 2hr. After one meal every day.  She agreed to do this.    We also discussed her diet, and her meals are low in calories. (she has lost 7 pounds).  The meals are not balance, and so it was suggested that she add protein to her breakfast and some carbohydrate to her lunch.  She was given a sheet of serving sizes of carbohydrates and told to have 1-2 servings at every meal.  She reported good understanding of this.

## 2013-11-27 NOTE — Patient Instructions (Signed)
Inject Trulicity once a week. Test blood sugars before and 2hr. After one meal daily.

## 2013-11-27 NOTE — Progress Notes (Signed)
Patient ID: Stephanie [name], female   DOB: Jun 07, 1955, 58 y.o.   MRN: [id number]           Reason for Appointment: Followup for Type 2 Diabetes  Referring physician: Hulan Fess  History of Present Illness:          Diagnosis: Type 2 diabetes mellitus, date of diagnosis: 2003       Past history:  She was initially treated metformin at some point glimepiride also added and not clear what her level of control was in the first few years. Had been only taking 500 mg twice a day of metformin and Amaryl increased to 4 mg twice a day several years ago In the last few years her blood sugar control has been more difficult and she has been managed by an endocrinologist Because of poor control she had been tried on Janumet and [place] but she states that she could not tolerate them because of headaches She does not think she has been given any other medications   Recent history:  She has been reluctant to add other medications to her metformin and Amaryl despite poor control Also has had difficulty with weight gain Despite detailed instructions and long discussion about benefits of adding a GLP-1 drug she did not start the Trulicity that was prescribed a month ago On her own however she has tried to modify her diet with reducing her overall intake She has lost significant amount of weight Her blood sugars did improve after she made the changes in her diet until about 2 weeks ago and they are now higher again Her blood sugars were as low as 89 in the evenings and 108 in the morning Recently her blood sugars are at least 150 in the mornings and mostly over 180 in the last few days after her evening meal She has tried to check her sugars more consistently also but mostly in the mornings and some after supper       Oral hypoglycemic drugs the patient is taking are: Amaryl 4 mg twice a day, metformin 1000 mg twice a day, Invokana: Candidiasis      Side effects from medications have been: Januvia,  Onglyza headaches Compliance with the medical regimen: Fair   Hypoglycemia: none  Glucose monitoring:  done 1.8 time a day         Glucometer: One Touch Ultra.      Blood Glucose readings by download shows   PREMEAL Breakfast Lunch Dinner 9P-2 AM Overall  Glucose range:  88-192  131, 139 120 117-296   Median:    158   150   150   Glycemic control:   Lab Results  Component Value Date   HGBA1C 8.6* 10/28/2013   HGBA1C 9.2* 07/05/2013   Lab Results  Component Value Date   MICROALBUR 66.7* 10/28/2013   LDLCALC 111* 10/28/2013   CREATININE 0.8 10/28/2013   Last urine microalbumin/creatinine ratio 251 in 3/15  Retinal exam: Most recent:7/15.    Self-care: The diet that the patient has been following is: tries to limit fats.      Meals: 3 meals per day. Breakfast is cereal or scrambled eggs. Lunch is half sandwich, yogurt and fruit; dinner usually baked chicken, bread and vegetables, snacks are fruits or nuts. Not eating out regularly Exercise:  walking video 30 min, 3/7 days a week       Dietician visit: Most recent: Several years ago.  Weight history: Previous 200-260  Wt Readings from Last 3 Encounters:  11/27/13 228 lb (103.42 kg)  10/28/13 242 lb 6.4 oz (109.952 kg)  09/13/13 240 lb (108.863 kg)       Medication List       This list is accurate as of: 11/27/13  1:39 PM.  Always use your most recent med list.               aspirin 81 MG tablet  Take 81 mg by mouth daily.     atorvastatin 10 MG tablet  Commonly known as:  LIPITOR  Take 1 tablet (10 mg total) by mouth daily.     glimepiride 4 MG tablet  Commonly known as:  AMARYL  Take 1 tablet (4 mg total) by mouth 2 (two) times daily.     glucose blood test strip  Commonly known as:  ONE TOUCH ULTRA TEST  Use as instructed to check blood sugar once a day dx code 250.02     Green Tea 250 MG Caps  Take by mouth.     hydrocortisone valerate cream 0.2 %  Commonly known as:  WESTCORT      losartan 50 MG tablet  Commonly known as:  COZAAR     metFORMIN 1000 MG tablet  Commonly known as:  GLUCOPHAGE  Take 1 tablet (1,000 mg total) by mouth 2 (two) times daily with a meal.     triamterene-hydrochlorothiazide 37.5-25 MG per tablet  Commonly known as:  MAXZIDE-25  Take 1 tablet by mouth daily.        Allergies:  Allergies  Allergen Reactions  . Avapro [Irbesartan] Other (See Comments)    headaches  . Codeine Nausea And Vomiting    Past Medical History  Diagnosis Date  . Diabetes mellitus   . Asthma   . Hypertension   . Abnormal Pap smear   . LGSIL (low grade squamous intraepithelial dysplasia)     Past Surgical History  Procedure Laterality Date  . Abdominal hysterectomy    . Bladder surgery    . Colposcopy      Family History  Problem Relation Age of Onset  . Diabetes Mother   . Diabetes Maternal Grandmother   . Heart disease Neg Hx   . Hypertension Neg Hx     Social History:  reports that she has never smoked. She has never used smokeless tobacco. She reports that she does not drink alcohol or use illicit drugs.    Review of Systems       Lipids: She has been started on Lipitor, LDL has been previously high, around 180       Lab Results  Component Value Date   CHOL 181 10/28/2013   HDL 42.80 10/28/2013   LDLCALC 111* 10/28/2013   TRIG 134.0 10/28/2013   CHOLHDL 4 10/28/2013                    She does have a history of mild Numbness but no tingling or burning in feet    Diabetic foot exam 8/15  Hypertension: Currently only on low-dose losartan  Complications: Proliferative retinopathy, microalbuminuria   Physical Examination:  BP 158/88 mmHg  Pulse 91  Temp(Src) 98 F (36.7 C)  Resp 14  Ht 5\' 3"  (1.6 m)  Wt 228 lb (103.42 kg)  BMI 40.40 kg/m2  SpO2 95%      ASSESSMENT:  Diabetes type 2, with obesity Although she appear to have relatively better blood sugars  last month her blood sugars are back up again in the last 2  weeks Appears that she had initial improvement in blood sugars with changing her diet significantly Although her weight has improved considerably she still has significant hyperglycemia Discussed in detail that she is having progression of her diabetes and blood sugars are inadequately controlled with Amaryl and metformin and these drugs will not stop the progression of her diabetes She also does not want to take Invokana which caused vaginitis Her blood sugars are significantly high fasting and somewhat high at night although variable  She has had a persistently high A1c of 9-10% over the last year  Hypercholesterolemia: Her LDL is improved although not at target, may consider increasing the dose if not improved on the following evaluation  Hypertension: Blood pressure is relatively higher today but she may be anxious, consider increasing her losartan  PLAN:   Start Trulicity, Discussed again the doses and benefits  She will be instructed on the injection technique by the nurse educator today  More consistent monitoring after meals  Reduce morning Amaryl to half a tablet to prevent daytime hypoglycemia  Exercise regularly at least  4 times a week  No change in Lipitor as yet   Counseling time over 50% of today's 25 minute visit   Mende Biswell 11/27/2013, 1:39 PM   Note: This office note was prepared with Estate agent. Any transcriptional errors that result from this process are unintentional.

## 2013-11-28 ENCOUNTER — Telehealth: Payer: Self-pay | Admitting: Endocrinology

## 2013-11-28 NOTE — Telephone Encounter (Signed)
Patient would like for Suanne Marker to please call her   Thank you

## 2013-12-04 ENCOUNTER — Ambulatory Visit (INDEPENDENT_AMBULATORY_CARE_PROVIDER_SITE_OTHER): Payer: Medicare Other | Admitting: Ophthalmology

## 2013-12-23 ENCOUNTER — Ambulatory Visit (INDEPENDENT_AMBULATORY_CARE_PROVIDER_SITE_OTHER): Payer: Medicare Other | Admitting: Ophthalmology

## 2013-12-23 DIAGNOSIS — I10 Essential (primary) hypertension: Secondary | ICD-10-CM | POA: Diagnosis not present

## 2013-12-23 DIAGNOSIS — H43813 Vitreous degeneration, bilateral: Secondary | ICD-10-CM | POA: Diagnosis not present

## 2013-12-23 DIAGNOSIS — E11331 Type 2 diabetes mellitus with moderate nonproliferative diabetic retinopathy with macular edema: Secondary | ICD-10-CM

## 2013-12-23 DIAGNOSIS — E11311 Type 2 diabetes mellitus with unspecified diabetic retinopathy with macular edema: Secondary | ICD-10-CM | POA: Diagnosis not present

## 2013-12-23 DIAGNOSIS — E11339 Type 2 diabetes mellitus with moderate nonproliferative diabetic retinopathy without macular edema: Secondary | ICD-10-CM | POA: Diagnosis not present

## 2013-12-23 DIAGNOSIS — H35033 Hypertensive retinopathy, bilateral: Secondary | ICD-10-CM | POA: Diagnosis not present

## 2013-12-25 ENCOUNTER — Encounter (HOSPITAL_COMMUNITY): Payer: Self-pay | Admitting: Emergency Medicine

## 2013-12-25 ENCOUNTER — Other Ambulatory Visit: Payer: Self-pay | Admitting: *Deleted

## 2013-12-25 ENCOUNTER — Emergency Department (HOSPITAL_COMMUNITY)
Admission: EM | Admit: 2013-12-25 | Discharge: 2013-12-25 | Disposition: A | Discharge status: Home / Self Care | Payer: Medicare Other | Attending: Emergency Medicine | Admitting: Emergency Medicine

## 2013-12-25 ENCOUNTER — Telehealth: Payer: Self-pay | Admitting: Endocrinology

## 2013-12-25 ENCOUNTER — Emergency Department (HOSPITAL_COMMUNITY): Payer: Medicare Other

## 2013-12-25 DIAGNOSIS — Z79899 Other long term (current) drug therapy: Secondary | ICD-10-CM | POA: Diagnosis not present

## 2013-12-25 DIAGNOSIS — J45909 Unspecified asthma, uncomplicated: Secondary | ICD-10-CM | POA: Diagnosis not present

## 2013-12-25 DIAGNOSIS — I10 Essential (primary) hypertension: Secondary | ICD-10-CM | POA: Insufficient documentation

## 2013-12-25 DIAGNOSIS — E119 Type 2 diabetes mellitus without complications: Secondary | ICD-10-CM | POA: Insufficient documentation

## 2013-12-25 DIAGNOSIS — Z7982 Long term (current) use of aspirin: Secondary | ICD-10-CM | POA: Insufficient documentation

## 2013-12-25 DIAGNOSIS — R51 Headache: Secondary | ICD-10-CM | POA: Diagnosis not present

## 2013-12-25 DIAGNOSIS — R1110 Vomiting, unspecified: Secondary | ICD-10-CM | POA: Diagnosis not present

## 2013-12-25 DIAGNOSIS — R519 Headache, unspecified: Secondary | ICD-10-CM

## 2013-12-25 LAB — BASIC METABOLIC PANEL
Anion gap: 14 (ref 5–15)
BUN: 16 mg/dL (ref 6–23)
CO2: 28 mEq/L (ref 19–32)
Calcium: 9.4 mg/dL (ref 8.4–10.5)
Chloride: 100 mEq/L (ref 96–112)
Creatinine, Ser: 0.81 mg/dL (ref 0.50–1.10)
GFR calc Af Amer: 90 mL/min — ABNORMAL LOW (ref >90)
GFR calc non Af Amer: 77 mL/min — ABNORMAL LOW (ref >90)
Glucose, Bld: 129 mg/dL — ABNORMAL HIGH (ref 70–99)
Potassium: 3.8 mEq/L (ref 3.7–5.3)
Sodium: 142 mEq/L (ref 137–147)

## 2013-12-25 LAB — CBC WITH DIFFERENTIAL/PLATELET
Basophils Absolute: 0 10*3/uL (ref 0.0–0.1)
Basophils Relative: 0 % (ref 0–1)
Eosinophils Absolute: 0.1 10*3/uL (ref 0.0–0.7)
Eosinophils Relative: 1 % (ref 0–5)
HCT: 34 % — ABNORMAL LOW (ref 36.0–46.0)
Hemoglobin: 10.6 g/dL — ABNORMAL LOW (ref 12.0–15.0)
Lymphocytes Relative: 23 % (ref 12–46)
Lymphs Abs: 1.9 10*3/uL (ref 0.7–4.0)
MCH: 26.6 pg (ref 26.0–34.0)
MCHC: 31.2 g/dL (ref 30.0–36.0)
MCV: 85.4 fL (ref 78.0–100.0)
Monocytes Absolute: 0.6 10*3/uL (ref 0.1–1.0)
Monocytes Relative: 7 % (ref 3–12)
Neutro Abs: 5.5 10*3/uL (ref 1.7–7.7)
Neutrophils Relative %: 69 % (ref 43–77)
Platelets: 298 10*3/uL (ref 150–400)
RBC: 3.98 MIL/uL (ref 3.87–5.11)
RDW: 14 % (ref 11.5–15.5)
WBC: 8.1 10*3/uL (ref 4.0–10.5)

## 2013-12-25 MED ORDER — METOCLOPRAMIDE HCL 5 MG/ML IJ SOLN
5.0000 mg | Freq: Once | INTRAMUSCULAR | Status: AC
  Administered 2013-12-25: 5 mg via INTRAVENOUS
  Filled 2013-12-25: qty 2

## 2013-12-25 MED ORDER — LOSARTAN POTASSIUM 50 MG PO TABS: 50.0000 mg | ORAL_TABLET | Freq: Every day | ORAL | Status: DC

## 2013-12-25 MED ORDER — DIPHENHYDRAMINE HCL 50 MG/ML IJ SOLN
25.0000 mg | Freq: Once | INTRAMUSCULAR | Status: AC
  Administered 2013-12-25: 25 mg via INTRAVENOUS
  Filled 2013-12-25: qty 1

## 2013-12-25 MED ORDER — LOSARTAN POTASSIUM 50 MG PO TABS
50.0000 mg | ORAL_TABLET | Freq: Once | ORAL | Status: AC
  Administered 2013-12-25: 50 mg via ORAL
  Filled 2013-12-25: qty 1

## 2013-12-25 MED ORDER — METFORMIN HCL 1000 MG PO TABS: 1000.0000 mg | ORAL_TABLET | Freq: Two times a day (BID) | ORAL | Status: DC

## 2013-12-25 MED ORDER — DULAGLUTIDE 0.75 MG/0.5ML ~~LOC~~ SOAJ: 0.7500 mg | SUBCUTANEOUS | Status: DC

## 2013-12-25 MED ORDER — ATORVASTATIN CALCIUM 10 MG PO TABS
10.0000 mg | ORAL_TABLET | Freq: Every day | ORAL | Status: DC
Start: 2013-12-25 — End: 2014-06-02

## 2013-12-25 NOTE — Telephone Encounter (Signed)
Patient needs her medication called in to her pharmacy   Metformin  Lipitor  Losartan  Trulicity    Please send to pharmacy asap   Thank you

## 2013-12-25 NOTE — ED Notes (Signed)
Patient transported to CT 

## 2013-12-25 NOTE — Telephone Encounter (Signed)
rx sent

## 2013-12-25 NOTE — ED Provider Notes (Signed)
CSN: [id number]     Arrival date & time 12/25/13  0207 History  This chart was scribed for Leota Jacobsen, MD by Marlowe Kays, ED Scribe. This patient was seen in room D31C/D31C and the patient's care was started at 2:41 AM.  Chief Complaint  Patient presents with  . Headache   Patient is a 58 y.o. female presenting with headaches. The history is provided by the patient. No language interpreter was used.  Headache Associated symptoms: vomiting   Associated symptoms: no numbness     HPI Comments:  WISDOM MCNAMAR is a 58 y.o. obese female with PMH of HTN who presents to the Emergency Department complaining of an improving, moderate, left-sided HA that began several hours ago. Pt reports one episode of emesis secondary to pain earlier today after the HA began. She reports she has been out of her blood pressure medication, Losartan,  for the past several days. Endorses it is normal for her to get HA when her BP is elevated. She has not taken anything for her HA PTA. Denies modifying factors. Denies weakness or numbness of the extremities. PMH of DM and asthma. Dr. Dwyane Dee is patient's PCP.  Past Medical History  Diagnosis Date  . Diabetes mellitus   . Asthma   . Hypertension   . Abnormal Pap smear   . LGSIL (low grade squamous intraepithelial dysplasia)    Past Surgical History  Procedure Laterality Date  . Abdominal hysterectomy    . Bladder surgery    . Colposcopy     Family History  Problem Relation Age of Onset  . Diabetes Mother   . Diabetes Maternal Grandmother   . Heart disease Neg Hx   . Hypertension Neg Hx    History  Substance Use Topics  . Smoking status: Never Smoker   . Smokeless tobacco: Never Used  . Alcohol Use: No   OB History    Gravida Para Term Preterm AB TAB SAB Ectopic Multiple Living   2 2        2      Review of Systems  Gastrointestinal: Positive for vomiting.  Neurological: Positive for headaches. Negative for weakness and numbness.  All other  systems reviewed and are negative.   Allergies  Avapro and Codeine  Home Medications   Prior to Admission medications   Medication Sig Start Date End Date Taking? Authorizing Provider  aspirin 81 MG tablet Take 81 mg by mouth daily.      Historical Provider, MD  atorvastatin (LIPITOR) 10 MG tablet Take 1 tablet (10 mg total) by mouth daily. 09/13/13   Elayne Snare, MD  Dulaglutide (TRULICITY) [id number] [id number] SOPN Inject 0.75 mg into the skin once a week.    Historical Provider, MD  glimepiride (AMARYL) 4 MG tablet Take 1 tablet (4 mg total) by mouth 2 (two) times daily. 11/27/13   Elayne Snare, MD  glucose blood (ONE TOUCH ULTRA TEST) test strip Use as instructed to check blood sugar once a day dx code 250.02 10/07/13   Elayne Snare, MD  Nyoka Cowden Tea 250 MG CAPS Take by mouth.    Historical Provider, MD  hydrocortisone valerate cream (WESTCORT) 0.2 %  06/28/13   Historical Provider, MD  losartan (COZAAR) 50 MG tablet  08/15/13   Historical Provider, MD  metFORMIN (GLUCOPHAGE) 1000 MG tablet Take 1 tablet (1,000 mg total) by mouth 2 (two) times daily with a meal. 09/13/13   Elayne Snare, MD  triamterene-hydrochlorothiazide (MAXZIDE-25) 37.5-25 MG per tablet Take  1 tablet by mouth daily.      Historical Provider, MD   Triage Vitals: BP 199/94 mmHg  Pulse 104  Temp(Src) 98.1 F (36.7 C) (Oral)  Resp 20  Ht 5\' 3"  (1.6 m)  Wt 224 lb (101.606 kg)  BMI 39.69 kg/m2  SpO2 96% Physical Exam  Constitutional: She is oriented to person, place, and time. She appears well-developed and well-nourished.  Non-toxic appearance. No distress.  HENT:  Head: Normocephalic and atraumatic.  Eyes: Conjunctivae, EOM and lids are normal. Pupils are equal, round, and reactive to light.  Neck: Normal range of motion. Neck supple. No tracheal deviation present. No thyroid mass present.  Cardiovascular: Normal rate, regular rhythm and normal heart sounds.  Exam reveals no gallop.   No murmur heard. Pulmonary/Chest: Effort normal  and breath sounds normal. No stridor. No respiratory distress. She has no decreased breath sounds. She has no wheezes. She has no rhonchi. She has no rales.  Abdominal: Soft. Normal appearance and bowel sounds are normal. She exhibits no distension. There is no tenderness. There is no rebound and no CVA tenderness.  Musculoskeletal: Normal range of motion. She exhibits no edema or tenderness.  Neurological: She is alert and oriented to person, place, and time. She has normal strength. No cranial nerve deficit or sensory deficit. GCS eye subscore is 4. GCS verbal subscore is 5. GCS motor subscore is 6.  Skin: Skin is warm and dry. No abrasion and no rash noted.  Psychiatric: She has a normal mood and affect. Her speech is normal and behavior is normal.  Nursing note and vitals reviewed.   ED Course  Procedures (including critical care time) DIAGNOSTIC STUDIES: Oxygen Saturation is 96% on RA, normal by my interpretation.   COORDINATION OF CARE: 2:45 AM- Will order CT head and give dose of Losartan. Pt verbalizes understanding and agrees to plan.  Medications - No data to display  Labs Review Labs Reviewed - No data to display  Imaging Review No results found.   EKG Interpretation None      MDM   Final diagnoses:  None    Patient given medications for headache and feels better. Also given her normal medication for hypertension. Stable for discharge  I personally performed the services described in this documentation, which was scribed in my presence. The recorded information has been reviewed and is accurate.    Leota Jacobsen, MD 12/25/13 [phone]

## 2013-12-25 NOTE — Discharge Instructions (Signed)
Hypertension °Hypertension, commonly called high blood pressure, is when the force of blood pumping through your arteries is too strong. Your arteries are the blood vessels that carry blood from your heart throughout your body. A blood pressure reading consists of a higher number over a lower number, such as 110/72. The higher number (systolic) is the pressure inside your arteries when your heart pumps. The lower number (diastolic) is the pressure inside your arteries when your heart relaxes. Ideally you want your blood pressure below 120/80. °Hypertension forces your heart to work harder to pump blood. Your arteries may become narrow or stiff. Having hypertension puts you at risk for heart disease, stroke, and other problems.  °RISK FACTORS °Some risk factors for high blood pressure are controllable. Others are not.  °Risk factors you cannot control include:  °· Race. You may be at higher risk if you are African American. °· Age. Risk increases with age. °· Gender. Men are at higher risk than women before age 45 years. After age 65, women are at higher risk than men. °Risk factors you can control include: °· Not getting enough exercise or physical activity. °· Being overweight. °· Getting too much fat, sugar, calories, or salt in your diet. °· Drinking too much alcohol. °SIGNS AND SYMPTOMS °Hypertension does not usually cause signs or symptoms. Extremely high blood pressure (hypertensive crisis) may cause headache, anxiety, shortness of breath, and nosebleed. °DIAGNOSIS  °To check if you have hypertension, your health care provider will measure your blood pressure while you are seated, with your arm held at the level of your heart. It should be measured at least twice using the same arm. Certain conditions can cause a difference in blood pressure between your right and left arms. A blood pressure reading that is higher than normal on one occasion does not mean that you need treatment. If one blood pressure reading  is high, ask your health care provider about having it checked again. °TREATMENT  °Treating high blood pressure includes making lifestyle changes and possibly taking medicine. Living a healthy lifestyle can help lower high blood pressure. You may need to change some of your habits. °Lifestyle changes may include: °· Following the DASH diet. This diet is high in fruits, vegetables, and whole grains. It is low in salt, red meat, and added sugars. °· Getting at least 2½ hours of brisk physical activity every week. °· Losing weight if necessary. °· Not smoking. °· Limiting alcoholic beverages. °· Learning ways to reduce stress. ° If lifestyle changes are not enough to get your blood pressure under control, your health care provider may prescribe medicine. You may need to take more than one. Work closely with your health care provider to understand the risks and benefits. °HOME CARE INSTRUCTIONS °· Have your blood pressure rechecked as directed by your health care provider.   °· Take medicines only as directed by your health care provider. Follow the directions carefully. Blood pressure medicines must be taken as prescribed. The medicine does not work as well when you skip doses. Skipping doses also puts you at risk for problems.   °· Do not smoke.   °· Monitor your blood pressure at home as directed by your health care provider.  °SEEK MEDICAL CARE IF:  °· You think you are having a reaction to medicines taken. °· You have recurrent headaches or feel dizzy. °· You have swelling in your ankles. °· You have trouble with your vision. °SEEK IMMEDIATE MEDICAL CARE IF: °· You develop a severe headache or confusion. °·   You have unusual weakness, numbness, or feel faint.  You have severe chest or abdominal pain.  You vomit repeatedly.  You have trouble breathing. MAKE SURE YOU:   Understand these instructions.  Will watch your condition.  Will get help right away if you are not doing well or get worse. Document  Released: 01/10/2005 Document Revised: 05/27/2013 Document Reviewed: 11/02/2012 [facility] Patient Information 2015 [place], [place]. This information is not intended to replace advice given to you by your health care provider. Make sure you discuss any questions you have with your health care provider. General Headache Without Cause A headache is pain or discomfort felt around the head or neck area. The specific cause of a headache may not be found. There are many causes and types of headaches. A few common ones are:  Tension headaches.  Migraine headaches.  Cluster headaches.  Chronic daily headaches. HOME CARE INSTRUCTIONS   Keep all follow-up appointments with your caregiver or any specialist referral.  Only take over-the-counter or prescription medicines for pain or discomfort as directed by your caregiver.  Lie down in a dark, quiet room when you have a headache.  Keep a headache journal to find out what may trigger your migraine headaches. For example, write down:  What you eat and drink.  How much sleep you get.  Any change to your diet or medicines.  Try massage or other relaxation techniques.  Put ice packs or heat on the head and neck. Use these 3 to 4 times per day for 15 to 20 minutes each time, or as needed.  Limit stress.  Sit up straight, and do not tense your muscles.  Quit smoking if you smoke.  Limit alcohol use.  Decrease the amount of caffeine you drink, or stop drinking caffeine.  Eat and sleep on a regular schedule.  Get 7 to 9 hours of sleep, or as recommended by your caregiver.  Keep lights dim if bright lights bother you and make your headaches worse. SEEK MEDICAL CARE IF:   You have problems with the medicines you were prescribed.  Your medicines are not working.  You have a change from the usual headache.  You have nausea or vomiting. SEEK IMMEDIATE MEDICAL CARE IF:   Your headache becomes severe.  You have a fever.  You have a  stiff neck.  You have loss of vision.  You have muscular weakness or loss of muscle control.  You start losing your balance or have trouble walking.  You feel faint or pass out.  You have severe symptoms that are different from your first symptoms. MAKE SURE YOU:   Understand these instructions.  Will watch your condition.  Will get help right away if you are not doing well or get worse. Document Released: 01/10/2005 Document Revised: 04/04/2011 Document Reviewed: 01/26/2011 [facility] Patient Information 2015 [place], [place]. This information is not intended to replace advice given to you by your health care provider. Make sure you discuss any questions you have with your health care provider.

## 2013-12-25 NOTE — ED Notes (Addendum)
Pt. reports headache with nausea onset this evening , hypertensive for the last several days she ran out of her antihypertensive medication last week .

## 2014-01-02 ENCOUNTER — Encounter (HOSPITAL_COMMUNITY): Payer: Self-pay | Admitting: Emergency Medicine

## 2014-01-02 ENCOUNTER — Emergency Department (HOSPITAL_COMMUNITY)
Admission: EM | Admit: 2014-01-02 | Discharge: 2014-01-02 | Disposition: A | Discharge status: Home / Self Care | Payer: Medicare Other | Attending: Emergency Medicine | Admitting: Emergency Medicine

## 2014-01-02 ENCOUNTER — Emergency Department (HOSPITAL_COMMUNITY): Payer: Medicare Other

## 2014-01-02 DIAGNOSIS — R1110 Vomiting, unspecified: Secondary | ICD-10-CM | POA: Diagnosis not present

## 2014-01-02 DIAGNOSIS — R51 Headache: Secondary | ICD-10-CM | POA: Diagnosis not present

## 2014-01-02 DIAGNOSIS — Z7982 Long term (current) use of aspirin: Secondary | ICD-10-CM | POA: Diagnosis not present

## 2014-01-02 DIAGNOSIS — E119 Type 2 diabetes mellitus without complications: Secondary | ICD-10-CM | POA: Insufficient documentation

## 2014-01-02 DIAGNOSIS — I10 Essential (primary) hypertension: Secondary | ICD-10-CM | POA: Insufficient documentation

## 2014-01-02 DIAGNOSIS — J45909 Unspecified asthma, uncomplicated: Secondary | ICD-10-CM | POA: Diagnosis not present

## 2014-01-02 DIAGNOSIS — R519 Headache, unspecified: Secondary | ICD-10-CM

## 2014-01-02 DIAGNOSIS — Z79899 Other long term (current) drug therapy: Secondary | ICD-10-CM | POA: Insufficient documentation

## 2014-01-02 LAB — URINALYSIS, ROUTINE W REFLEX MICROSCOPIC
Bilirubin Urine: NEGATIVE
Glucose, UA: 100 mg/dL — AB
Ketones, ur: NEGATIVE mg/dL
Leukocytes, UA: NEGATIVE
Nitrite: NEGATIVE
Protein, ur: >300 mg/dL — AB
Specific Gravity, Urine: 1.015 (ref 1.005–1.030)
Urobilinogen, UA: 0.2 mg/dL (ref 0.0–1.0)
pH: 7 (ref 5.0–8.0)

## 2014-01-02 LAB — I-STAT CHEM 8, ED
BUN: 12 mg/dL (ref 6–23)
Calcium, Ion: 1.15 mmol/L (ref 1.13–1.30)
Chloride: 100 mEq/L (ref 96–112)
Creatinine, Ser: 0.8 mg/dL (ref 0.50–1.10)
Glucose, Bld: 183 mg/dL — ABNORMAL HIGH (ref 70–99)
HCT: 36 % (ref 36.0–46.0)
Hemoglobin: 12.2 g/dL (ref 12.0–15.0)
Potassium: 3.6 mEq/L — ABNORMAL LOW (ref 3.7–5.3)
Sodium: 139 mEq/L (ref 137–147)
TCO2: 25 mmol/L (ref 0–100)

## 2014-01-02 LAB — URINE MICROSCOPIC-ADD ON

## 2014-01-02 MED ORDER — DIPHENHYDRAMINE HCL 50 MG/ML IJ SOLN
50.0000 mg | Freq: Once | INTRAMUSCULAR | Status: AC
  Administered 2014-01-02: 50 mg via INTRAVENOUS
  Filled 2014-01-02: qty 1

## 2014-01-02 MED ORDER — ONDANSETRON 4 MG PO TBDP
8.0000 mg | ORAL_TABLET | Freq: Once | ORAL | Status: AC
  Administered 2014-01-02: 8 mg via ORAL
  Filled 2014-01-02: qty 2

## 2014-01-02 MED ORDER — SODIUM CHLORIDE 0.9 % IV SOLN
INTRAVENOUS | Status: DC
  Administered 2014-01-02: 07:00:00 via INTRAVENOUS

## 2014-01-02 MED ORDER — DEXAMETHASONE SODIUM PHOSPHATE 10 MG/ML IJ SOLN
10.0000 mg | Freq: Once | INTRAMUSCULAR | Status: AC
  Administered 2014-01-02: 10 mg via INTRAVENOUS
  Filled 2014-01-02: qty 1

## 2014-01-02 MED ORDER — METOCLOPRAMIDE HCL 5 MG/ML IJ SOLN
10.0000 mg | Freq: Once | INTRAMUSCULAR | Status: AC
  Administered 2014-01-02: 10 mg via INTRAVENOUS
  Filled 2014-01-02: qty 2

## 2014-01-02 MED ORDER — HYDROMORPHONE HCL 1 MG/ML IJ SOLN
1.0000 mg | Freq: Once | INTRAMUSCULAR | Status: AC
  Administered 2014-01-02: 1 mg via INTRAVENOUS
  Filled 2014-01-02: qty 1

## 2014-01-02 MED ORDER — LABETALOL HCL 5 MG/ML IV SOLN
10.0000 mg | Freq: Once | INTRAVENOUS | Status: AC
  Administered 2014-01-02: 10 mg via INTRAVENOUS
  Filled 2014-01-02: qty 4

## 2014-01-02 MED ORDER — KETOROLAC TROMETHAMINE 30 MG/ML IJ SOLN
30.0000 mg | Freq: Once | INTRAMUSCULAR | Status: AC
  Administered 2014-01-02: 30 mg via INTRAVENOUS
  Filled 2014-01-02: qty 1

## 2014-01-02 NOTE — ED Provider Notes (Signed)
TIME SEEN: 5:50 AM  CHIEF COMPLAINT: Right-sided headache, vomiting, hypertension  HPI: Pt is a 58 y.o. F with history of hypertension, diabetes, asthma who presents to the emergency department with complaints of headache that started at 6:15 pm last night. She describes it as gradual onset but severe. Reports she has had similar headaches in the past when she has missed her dose of blood pressure medication. She states that she forgot to take her losartan and took it several hours after the time she is normally scheduled to take her medications. She has had vomiting. No aggravating or relieving factors to this headache. No numbness, teething or focal weakness. No vision changes. No chest pain or shortness of breath. She was seen in the emergency department for similar symptoms on 12/25/13 but at that time the headache was left-sided. She had normal labs on 12/25/13.  ROS: See HPI Constitutional: no fever  Eyes: no drainage  ENT: no runny nose   Cardiovascular:  no chest pain  Resp: no SOB  GI: no vomiting GU: no dysuria Integumentary: no rash  Allergy: no hives  Musculoskeletal: no leg swelling  Neurological: no slurred speech ROS otherwise negative  PAST MEDICAL HISTORY/PAST SURGICAL HISTORY:  Past Medical History  Diagnosis Date  . Diabetes mellitus   . Asthma   . Hypertension   . Abnormal Pap smear   . LGSIL (low grade squamous intraepithelial dysplasia)     MEDICATIONS:  Prior to Admission medications   Medication Sig Start Date End Date Taking? Authorizing Provider  aspirin 81 MG tablet Take 81 mg by mouth daily.     Yes Historical Provider, MD  atorvastatin (LIPITOR) 10 MG tablet Take 1 tablet (10 mg total) by mouth daily. 12/25/13  Yes Elayne Snare, MD  Dulaglutide (TRULICITY) [id number] [id number] SOPN Inject 0.75 mg into the skin once a week. Patient taking differently: Inject 0.75 mg into the skin once a week. On Sunday 12/25/13  Yes Elayne Snare, MD  glimepiride (AMARYL) 4 MG tablet  Take 1 tablet (4 mg total) by mouth 2 (two) times daily. 11/27/13  Yes Elayne Snare, MD  losartan (COZAAR) 50 MG tablet Take 1 tablet (50 mg total) by mouth daily. 12/25/13  Yes Elayne Snare, MD  metFORMIN (GLUCOPHAGE) 1000 MG tablet Take 1 tablet (1,000 mg total) by mouth 2 (two) times daily with a meal. 12/25/13  Yes Elayne Snare, MD  glucose blood (ONE TOUCH ULTRA TEST) test strip Use as instructed to check blood sugar once a day dx code 250.02 10/07/13   Elayne Snare, MD  Nyoka Cowden Tea 250 MG CAPS Take by mouth.    Historical Provider, MD  hydrocortisone valerate cream (WESTCORT) 0.2 %  06/28/13   Historical Provider, MD  triamterene-hydrochlorothiazide (MAXZIDE-25) 37.5-25 MG per tablet Take 1 tablet by mouth daily.      Historical Provider, MD    ALLERGIES:  Allergies  Allergen Reactions  . Avapro [Irbesartan] Other (See Comments)    headaches  . Codeine Nausea And Vomiting    SOCIAL HISTORY:  History  Substance Use Topics  . Smoking status: Never Smoker   . Smokeless tobacco: Never Used  . Alcohol Use: No    FAMILY HISTORY: Family History  Problem Relation Age of Onset  . Diabetes Mother   . Diabetes Maternal Grandmother   . Heart disease Neg Hx   . Hypertension Neg Hx     EXAM: BP 184/82 mmHg  Pulse 99  Temp(Src) 99 F (37.2 C) (Oral)  Resp  23  Ht 5\' 3"  (1.6 m)  Wt 220 lb (99.791 kg)  BMI 38.98 kg/m2  SpO2 96% CONSTITUTIONAL: Alert and oriented and responds appropriately to questions. Well-appearing; well-nourished, in no distress, nontoxic HEAD: Normocephalic EYES: Conjunctivae clear, PERRL, extraocular movements intact; has a photophobia on exam ENT: normal nose; no rhinorrhea; moist mucous membranes; pharynx without lesions noted, no uvular deviation, no tonsillar hypertrophy or exudate, no trismus or drooling, TMs are clear bilaterally  NECK: Supple, no meningismus, no LAD  CARD: Regular and tachycardic; S1 and S2 appreciated; no murmurs, no clicks, no rubs, no  gallops RESP: Normal chest excursion without splinting or tachypnea; breath sounds clear and equal bilaterally; no wheezes, no rhonchi, no rales,  ABD/GI: Normal bowel sounds; non-distended; soft, non-tender, no rebound, no guarding BACK:  The back appears normal and is non-tender to palpation, there is no CVA tenderness EXT: Normal ROM in all joints; non-tender to palpation; no edema; normal capillary refill; no cyanosis    SKIN: Normal color for age and race; warm NEURO: Moves all extremities equally; sensation to light touch intact diffusely, cranial nerves II through XII intact, no pronator drift PSYCH: The patient's mood and manner are appropriate. Grooming and personal hygiene are appropriate.  MEDICAL DECISION MAKING: Patient here with complaints of headache that is right sided that started last night. She is hypertensive in the ED. Reports her blood pressure is normally [id number] to [id number] systolic. Given she describes the headache as severe and related to her hypertension, will obtain a head CT to evaluate for possible intracranial hemorrhage. Will repeat labs to look for signs of end organ damage, urine, EKG. We'll give labetalol for her blood pressure and see if this helps only her blood pressure but also her headache.  ED PROGRESS: Labs and EKG show no signs of end organ damage.  Blood pressure has improved to 173/78 with no improvement of her headache. Discussed with patient that it may be her headache that is causing her to be hypertensive.  Given her headache is unilateral, throbbing and associated with photophobia, nausea and vomiting discussed with patient that I feel this may be a migraine. Will treat with Toradol, Reglan, Benadryl and reassess.  Dr. Wallie Char will follow up on pt's urine and HA sx.  Anticipate dc home with outpt follow up.      EKG Interpretation  Date/Time:  Thursday January 02 2014 06:36:28 EST Ventricular Rate:  101 PR Interval:  188 QRS Duration: 84 QT  Interval:  382 QTC Calculation: 495 R Axis:   42 Text Interpretation:  Sinus tachycardia Borderline prolonged QT interval No significant change since last tracing in 2012 Confirmed by WARD,  DO, KRISTEN [phone]) on 01/02/2014 6:38:48 AM        [place], DO 01/02/14 [id number]

## 2014-01-02 NOTE — Discharge Instructions (Signed)
General Headache Without Cause A headache is pain or discomfort felt around the head or neck area. The specific cause of a headache may not be found. There are many causes and types of headaches. A few common ones are:  Tension headaches.  Migraine headaches.  Cluster headaches.  Chronic daily headaches. HOME CARE INSTRUCTIONS   Keep all follow-up appointments with your caregiver or any specialist referral.  Only take over-the-counter or prescription medicines for pain or discomfort as directed by your caregiver.  Lie down in a dark, quiet room when you have a headache.  Keep a headache journal to find out what may trigger your migraine headaches. For example, write down:  What you eat and drink.  How much sleep you get.  Any change to your diet or medicines.  Try massage or other relaxation techniques.  Put ice packs or heat on the head and neck. Use these 3 to 4 times per day for 15 to 20 minutes each time, or as needed.  Limit stress.  Sit up straight, and do not tense your muscles.  Quit smoking if you smoke.  Limit alcohol use.  Decrease the amount of caffeine you drink, or stop drinking caffeine.  Eat and sleep on a regular schedule.  Get 7 to 9 hours of sleep, or as recommended by your caregiver.  Keep lights dim if bright lights bother you and make your headaches worse. SEEK MEDICAL CARE IF:   You have problems with the medicines you were prescribed.  Your medicines are not working.  You have a change from the usual headache.  You have nausea or vomiting. SEEK IMMEDIATE MEDICAL CARE IF:   Your headache becomes severe.  You have a fever.  You have a stiff neck.  You have loss of vision.  You have muscular weakness or loss of muscle control.  You start losing your balance or have trouble walking.  You feel faint or pass out.  You have severe symptoms that are different from your first symptoms. MAKE SURE YOU:   Understand these  instructions.  Will watch your condition.  Will get help right away if you are not doing well or get worse. Document Released: 01/10/2005 Document Revised: 04/04/2011 Document Reviewed: 01/26/2011 [facility] Patient Information 2015 [place], [place]. This information is not intended to replace advice given to you by your health care provider. Make sure you discuss any questions you have with your health care provider.  Hypertension Hypertension, commonly called high blood pressure, is when the force of blood pumping through your arteries is too strong. Your arteries are the blood vessels that carry blood from your heart throughout your body. A blood pressure reading consists of a higher number over a lower number, such as 110/72. The higher number (systolic) is the pressure inside your arteries when your heart pumps. The lower number (diastolic) is the pressure inside your arteries when your heart relaxes. Ideally you want your blood pressure below 120/80. Hypertension forces your heart to work harder to pump blood. Your arteries may become narrow or stiff. Having hypertension puts you at risk for heart disease, stroke, and other problems.  RISK FACTORS Some risk factors for high blood pressure are controllable. Others are not.  Risk factors you cannot control include:   Race. You may be at higher risk if you are African American.  Age. Risk increases with age.  Gender. Men are at higher risk than women before age 75 years. After age 67, women are at higher risk  than men. Risk factors you can control include:  Not getting enough exercise or physical activity.  Being overweight.  Getting too much fat, sugar, calories, or salt in your diet.  Drinking too much alcohol. SIGNS AND SYMPTOMS Hypertension does not usually cause signs or symptoms. Extremely high blood pressure (hypertensive crisis) may cause headache, anxiety, shortness of breath, and nosebleed. DIAGNOSIS  To check if you have  hypertension, your health care provider will measure your blood pressure while you are seated, with your arm held at the level of your heart. It should be measured at least twice using the same arm. Certain conditions can cause a difference in blood pressure between your right and left arms. A blood pressure reading that is higher than normal on one occasion does not mean that you need treatment. If one blood pressure reading is high, ask your health care provider about having it checked again. TREATMENT  Treating high blood pressure includes making lifestyle changes and possibly taking medicine. Living a healthy lifestyle can help lower high blood pressure. You may need to change some of your habits. Lifestyle changes may include:  Following the DASH diet. This diet is high in fruits, vegetables, and whole grains. It is low in salt, red meat, and added sugars.  Getting at least 2 hours of brisk physical activity every week.  Losing weight if necessary.  Not smoking.  Limiting alcoholic beverages.  Learning ways to reduce stress. If lifestyle changes are not enough to get your blood pressure under control, your health care provider may prescribe medicine. You may need to take more than one. Work closely with your health care provider to understand the risks and benefits. HOME CARE INSTRUCTIONS  Have your blood pressure rechecked as directed by your health care provider.   Take medicines only as directed by your health care provider. Follow the directions carefully. Blood pressure medicines must be taken as prescribed. The medicine does not work as well when you skip doses. Skipping doses also puts you at risk for problems.   Do not smoke.   Monitor your blood pressure at home as directed by your health care provider. SEEK MEDICAL CARE IF:   You think you are having a reaction to medicines taken.  You have recurrent headaches or feel dizzy.  You have swelling in your  ankles.  You have trouble with your vision. SEEK IMMEDIATE MEDICAL CARE IF:  You develop a severe headache or confusion.  You have unusual weakness, numbness, or feel faint.  You have severe chest or abdominal pain.  You vomit repeatedly.  You have trouble breathing. MAKE SURE YOU:   Understand these instructions.  Will watch your condition.  Will get help right away if you are not doing well or get worse. Document Released: 01/10/2005 Document Revised: 05/27/2013 Document Reviewed: 11/02/2012 [facility] Patient Information 2015 [place], [place]. This information is not intended to replace advice given to you by your health care provider. Make sure you discuss any questions you have with your health care provider.

## 2014-01-02 NOTE — ED Provider Notes (Signed)
Patient without significant improvement with the migraine cocktail. When had added on a dose of Decadron today patient precautions about it could elevate her blood sugar. Patient also given 1 mg of hydromorphone. Headache now down to 1 out of 10 patient with significant improvement. In addition blood pressure improved significantly once pain was controlled. Systolic now's in the [id number]. Diastolic normal.  Fredia Sorrow, MD 01/02/14 1011

## 2014-01-02 NOTE — ED Notes (Signed)
Patient transported to CT 

## 2014-01-02 NOTE — ED Notes (Signed)
Pt states that she did not take her evening medications on time last night, and has a headache since 1815 last night.

## 2014-01-02 NOTE — ED Notes (Addendum)
Per pharmacy tech, pt is vomiting at this time.

## 2014-01-03 ENCOUNTER — Other Ambulatory Visit (INDEPENDENT_AMBULATORY_CARE_PROVIDER_SITE_OTHER): Payer: Medicare Other

## 2014-01-03 DIAGNOSIS — IMO0002 Reserved for concepts with insufficient information to code with codable children: Secondary | ICD-10-CM

## 2014-01-03 DIAGNOSIS — E1165 Type 2 diabetes mellitus with hyperglycemia: Secondary | ICD-10-CM | POA: Diagnosis not present

## 2014-01-03 LAB — BASIC METABOLIC PANEL
BUN: 28 mg/dL — ABNORMAL HIGH (ref 6–23)
CO2: 26 mEq/L (ref 19–32)
Calcium: 9.2 mg/dL (ref 8.4–10.5)
Chloride: 99 mEq/L (ref 96–112)
Creatinine, Ser: 1.1 mg/dL (ref 0.4–1.2)
GFR: 63.76 mL/min (ref >60.00)
Glucose, Bld: 86 mg/dL (ref 70–99)
Potassium: 3.3 mEq/L — ABNORMAL LOW (ref 3.5–5.1)
Sodium: 135 mEq/L (ref 135–145)

## 2014-01-03 LAB — HEMOGLOBIN A1C: Hgb A1c MFr Bld: 7.1 % — ABNORMAL HIGH (ref 4.6–6.5)

## 2014-01-08 ENCOUNTER — Encounter: Payer: Self-pay | Admitting: Endocrinology

## 2014-01-08 ENCOUNTER — Other Ambulatory Visit: Payer: Self-pay | Admitting: *Deleted

## 2014-01-08 ENCOUNTER — Encounter: Payer: Medicare Other | Attending: Endocrinology | Admitting: Nutrition

## 2014-01-08 ENCOUNTER — Ambulatory Visit (INDEPENDENT_AMBULATORY_CARE_PROVIDER_SITE_OTHER): Payer: Medicare Other | Admitting: Endocrinology

## 2014-01-08 VITALS — BP 173/87 | HR 101 | Temp 98.3°F | Resp 16 | Ht 63.0 in | Wt 225.4 lb

## 2014-01-08 DIAGNOSIS — IMO0002 Reserved for concepts with insufficient information to code with codable children: Secondary | ICD-10-CM

## 2014-01-08 DIAGNOSIS — E1165 Type 2 diabetes mellitus with hyperglycemia: Secondary | ICD-10-CM | POA: Diagnosis not present

## 2014-01-08 DIAGNOSIS — Z713 Dietary counseling and surveillance: Secondary | ICD-10-CM | POA: Insufficient documentation

## 2014-01-08 DIAGNOSIS — I10 Essential (primary) hypertension: Secondary | ICD-10-CM | POA: Diagnosis not present

## 2014-01-08 MED ORDER — TRIAMTERENE-HCTZ 37.5-25 MG PO TABS: 1.0000 | ORAL_TABLET | Freq: Every day | ORAL | Status: DC

## 2014-01-08 MED ORDER — ALBIGLUTIDE 30 MG ~~LOC~~ PEN: PEN_INJECTOR | SUBCUTANEOUS | Status: DC

## 2014-01-08 NOTE — Progress Notes (Signed)
Patient ID: Stephanie [name], female   DOB: 11/21/1955, 58 y.o.   MRN: [id number]           Reason for Appointment: Followup for Type 2 Diabetes  Referring physician: Hulan Fess  History of Present Illness:          Diagnosis: Type 2 diabetes mellitus, date of diagnosis: 2003       Past history:  She was initially treated metformin at some point glimepiride also added and not clear what her level of control was in the first few years. Had been only taking 500 mg twice a day of metformin and Amaryl increased to 4 mg twice a day several years ago In the last few years her blood sugar control has been more difficult and she has been managed by an endocrinologist Because of poor control she had been tried on Janumet and [place] but she states that she could not tolerate them because of headaches She does not think she has been given any other medications   Recent history:  She has been reluctant to add other medications to her metformin and Amaryl despite poor control Because of inadequate control with Amaryl and metformin and also difficulty with weight gain she finally agreed to start Trulicity in 123456. She thinks she has taken about 5 injections Although her blood sugars appear to be overall significantly better and consistent she is checking these readings only in the mornings now and has only one on fasting reading about 4 weeks ago Previously she was having blood sugars averaging about 150-160 both morning and evening However she is complaining of persistent nausea and occasional vomiting which is not improving She has lost only about 3 pounds since her last visit but had previously lost 14 pounds already Blood sugars are now averaging about 119 Also has reduced her Amaryl to half a tablet as directed       Oral hypoglycemic drugs the patient is taking are: Amaryl 4 mg twice a day, metformin 1000 mg twice a day, Invokana: Candidiasis      Side effects from medications have been:  Januvia, Onglyza apparently caused headaches Compliance with the medical regimen: Fair   Hypoglycemia: none  Glucose monitoring:  done 1.1 time a day         Glucometer: One Touch Ultra.      Blood Glucose readings by download shows   PRE-MEAL Breakfast Lunch Dinner Bedtime Overall  Glucose range: 84-181    100    Mean/median: 115    115    Glycemic control:   Lab Results  Component Value Date   HGBA1C 7.1* 01/03/2014   HGBA1C 8.6* 10/28/2013   HGBA1C 9.2* 07/05/2013   Lab Results  Component Value Date   MICROALBUR 66.7* 10/28/2013   LDLCALC 111* 10/28/2013   CREATININE 1.1 01/03/2014   Last urine microalbumin/creatinine ratio 251 in 3/15  Retinal exam: Most recent:7/15.    Self-care: The diet that the patient has been following is: tries to limit fats.      Meals: 3 meals per day. Breakfast is cereal or scrambled eggs. Lunch is half sandwich, yogurt and fruit; dinner usually baked chicken, bread and vegetables, snacks are fruits or nuts. Not eating out regularly Exercise:  walking video 30 min, 3/7 days a week       Dietician visit: Most recent: Several years ago.               Weight history: Previous 200-260  Wt Readings from  Last 3 Encounters:  01/08/14 225 lb 6.4 oz (102.241 kg)  01/02/14 220 lb (99.791 kg)  12/25/13 224 lb (101.606 kg)       Medication List       This list is accurate as of: 01/08/14  2:06 PM.  Always use your most recent med list.               aspirin 81 MG tablet  Take 81 mg by mouth daily.     atorvastatin 10 MG tablet  Commonly known as:  LIPITOR  Take 1 tablet (10 mg total) by mouth daily.     Dulaglutide 0.75 MG/0.5ML Sopn  Commonly known as:  TRULICITY  Inject A999333 mg into the skin once a week.     glimepiride 4 MG tablet  Commonly known as:  AMARYL  Take 1 tablet (4 mg total) by mouth 2 (two) times daily.     glucose blood test strip  Commonly known as:  ONE TOUCH ULTRA TEST  Use as instructed to check blood sugar  once a day dx code 250.02     hydrocortisone valerate cream 0.2 %  Commonly known as:  WESTCORT     losartan 50 MG tablet  Commonly known as:  COZAAR  Take 1 tablet (50 mg total) by mouth daily.     metFORMIN 1000 MG tablet  Commonly known as:  GLUCOPHAGE  Take 1 tablet (1,000 mg total) by mouth 2 (two) times daily with a meal.     triamterene-hydrochlorothiazide 37.5-25 MG per tablet  Commonly known as:  MAXZIDE-25  Take 1 tablet by mouth daily.        Allergies:  Allergies  Allergen Reactions  . Avapro [Irbesartan] Other (See Comments)    headaches  . Codeine Nausea And Vomiting    Past Medical History  Diagnosis Date  . Diabetes mellitus   . Asthma   . Hypertension   . Abnormal Pap smear   . LGSIL (low grade squamous intraepithelial dysplasia)     Past Surgical History  Procedure Laterality Date  . Abdominal hysterectomy    . Bladder surgery    . Colposcopy      Family History  Problem Relation Age of Onset  . Diabetes Mother   . Diabetes Maternal Grandmother   . Heart disease Neg Hx   . Hypertension Neg Hx     Social History:  reports that she has never smoked. She has never used smokeless tobacco. She reports that she does not drink alcohol or use illicit drugs.    Review of Systems    Hypertension: She has not followed up with her PCP about her blood pressure for quite some time On her last visit she was only taking losartan but she ran out and her blood pressure has been much higher.  She went to the ER with headaches and high blood pressure and is back on losartan Although she had been on Maxzide after review of her medications a couple of times it appears that she has not taken this for some time Blood pressure is still high today      Lipids: She has been  on Lipitor, LDL has been previously high, around 180       Lab Results  Component Value Date   CHOL 181 10/28/2013   HDL 42.80 10/28/2013   LDLCALC 111* 10/28/2013   TRIG 134.0  10/28/2013   CHOLHDL 4 10/28/2013  She does have a history of mild Numbness but no tingling or burning in feet    Diabetic foot exam 0000000   Complications from diabetes: Proliferative retinopathy, microalbuminuria   Physical Examination:  BP 173/87 mmHg  Pulse 101  Temp(Src) 98.3 F (36.8 C)  Resp 16  Ht 5\' 3"  (1.6 m)  Wt 225 lb 6.4 oz (102.241 kg)  BMI 39.94 kg/m2  SpO2 97%      ASSESSMENT:  Diabetes type 2, with obesity Although she has had improved blood sugars since starting Trulicity and reducing Amaryl she has not checked any readings after meals and not clear if her blood sugars are consistently controlled A1c is nearly 7% which is improvement from previous levels She is however appeared to have persistent nausea from Trulicity even using A999333 mg and occasional nausea She does need to check readings after meals which he has not been doing lately; discussed blood sugar targets  Hypercholesterolemia: To be checked on a following visit, may improve with weight loss  Hypertension: Blood pressure is much higher today and most likely this is from her not taking Maxzide for no apparent reason.  Currently only on losartan and has not followed up with PCP  PLAN:   Start TANZEUM instead of Trulicity.  Discussed how this works, injection technique and timing of injection and possible side effects  She will however need to call if she has any persistent nausea with this  Continue same doses of Amaryl and metformin  Start Maxzide and follow-up with PCP for blood pressure  , More postprandial readings as discussed    Counseling time over 50% of today's 25 minute visit   Codi Folkerts 01/08/2014, 2:06 PM   Note: This office note was prepared with Estate agent. Any transcriptional errors that result from this process are unintentional.

## 2014-01-08 NOTE — Patient Instructions (Signed)
Mix Tanzium as instructed Take Tanzium once a week.   Call help line if questions.

## 2014-01-08 NOTE — Patient Instructions (Addendum)
Please check blood sugars at least half the time about 2 hours after any meal and times per week on waking up. Please bring blood sugar monitor to each visit  Change Trulicty to Tanzeum 30 mg weekly  Restart Triamterene HCT

## 2014-01-08 NOTE — Progress Notes (Signed)
This patient was instructed on how to mix and inject the Tanzium.  She re demonstrated how to do this correctly.  I reminded her that this is a once a week injection.  She was given a Tanzium starter kit with written instructions for pen use, a DVD with instructions for pen use, a timer for a 15 min. Wait time needed, and a help telephone number.  The patient reported good understanding of how to mix and take this medication, and had no final questions.

## 2014-01-09 NOTE — Telephone Encounter (Signed)
Please see below and advise.

## 2014-01-09 NOTE — Telephone Encounter (Signed)
Patient stated that she couldn't change the Trulicity to the Tanzeum because medicare wouldn't pay. She would rather stay on the Trulicity.

## 2014-01-09 NOTE — Telephone Encounter (Signed)
She is still having nausea from the Trulicity would like her to try Tradgenta 5 mg tablet if she would like to change

## 2014-01-10 NOTE — Telephone Encounter (Signed)
Patient says she wants to stay on the Trulicity, the nausea is getting better.

## 2014-01-10 NOTE — Telephone Encounter (Signed)
lmtcb

## 2014-01-21 ENCOUNTER — Other Ambulatory Visit: Payer: Self-pay | Admitting: Endocrinology

## 2014-02-07 DIAGNOSIS — E780 Pure hypercholesterolemia: Secondary | ICD-10-CM | POA: Diagnosis not present

## 2014-02-07 DIAGNOSIS — Z23 Encounter for immunization: Secondary | ICD-10-CM | POA: Diagnosis not present

## 2014-02-07 DIAGNOSIS — I10 Essential (primary) hypertension: Secondary | ICD-10-CM | POA: Diagnosis not present

## 2014-02-12 ENCOUNTER — Ambulatory Visit (INDEPENDENT_AMBULATORY_CARE_PROVIDER_SITE_OTHER): Payer: Medicare Other | Admitting: Endocrinology

## 2014-02-12 ENCOUNTER — Encounter: Payer: Self-pay | Admitting: Endocrinology

## 2014-02-12 VITALS — BP 150/82 | Temp 98.9°F | Wt 224.8 lb

## 2014-02-12 DIAGNOSIS — E1165 Type 2 diabetes mellitus with hyperglycemia: Secondary | ICD-10-CM

## 2014-02-12 DIAGNOSIS — IMO0002 Reserved for concepts with insufficient information to code with codable children: Secondary | ICD-10-CM

## 2014-02-12 DIAGNOSIS — I10 Essential (primary) hypertension: Secondary | ICD-10-CM

## 2014-02-12 NOTE — Patient Instructions (Addendum)
Please check blood sugars at least half the time about 2 hours after any meal and 3-4 times per week on waking up. Please bring blood sugar monitor to each visit  GLIMEPERIDE 1/2 pill before supper daily

## 2014-02-12 NOTE — Progress Notes (Signed)
Patient ID: Stephanie [name], female   DOB: 03-10-1955, 59 y.o.   MRN: [id number]           Reason for Appointment: Followup for Type 2 Diabetes  Referring physician: Hulan Fess  History of Present Illness:          Diagnosis: Type 2 diabetes mellitus, date of diagnosis: 2003       Past history:  She was initially treated metformin at some point glimepiride also added and not clear what her level of control was in the first few years. Had been only taking 500 mg twice a day of metformin and Amaryl increased to 4 mg twice a day several years ago In the last few years her blood sugar control has been more difficult and she has been managed by an endocrinologist Because of poor control she had been tried on Janumet and [place] but she states that she could not tolerate them because of headaches She does not think she has been given any other medications   Recent history:  Because of inadequate control with Amaryl and metformin and also difficulty with weight gain she finally agreed to start Trulicity in 123456. Her weight on her initial visit was 240 pounds. She was complaining of feeling nauseated and having occasional vomiting with Trulicity after taking 5 injections of this She was asked to switch to Tanzeum in December but she wanted to try and continue on Trulicity possibly because of higher cost She now says that she has not had any nausea and is not having any other side effects with Trulicity which she is taking weekly Her A1c had gone down to 7.1% in 12/15 No recent labs available Unable to download her glucose monitor but her fasting blood sugars appear to be mildly increased She has not checked any POSTPRANDIAL readings despite instructions She has also been noncompliant with her exercise over the last month even though she was fairly regular last time No recent weight loss Blood sugars are now averaging about 133 compared to 119 previously She has reduced her Amaryl to half  a tablet but is taking this in the morning; she thinks she is usually not eating much in the morning are consistently and also her lunch is variable       Oral hypoglycemic drugs the patient is taking are: Amaryl 2 mg q am, metformin 1000 mg twice a day, Invokana: Candidiasis      Side effects from medications have been: Januvia, Onglyza apparently caused headaches Compliance with the medical regimen: Fair   Hypoglycemia: none  Glucose monitoring:  done 0.8 time a day         Glucometer: One Touch Ultra.      Blood Glucose readings by me to review shows morning range 128-180, afternoon 108 30 day average = 133  Glycemic control:   Lab Results  Component Value Date   HGBA1C 7.1* 01/03/2014   HGBA1C 8.6* 10/28/2013   HGBA1C 9.2* 07/05/2013   Lab Results  Component Value Date   MICROALBUR 66.7* 10/28/2013   LDLCALC 111* 10/28/2013   CREATININE 1.1 01/03/2014   Last urine microalbumin/creatinine ratio 251 in 3/15  Retinal exam: Most recent:7/15.    Self-care: The diet that the patient has been following is: tries to limit fats.      Meals: 2-3 meals per day. Breakfast is cereal or scrambled eggs. Lunch is half sandwich, yogurt and fruit; dinner usually baked chicken, bread and vegetables, snacks are fruits or nuts. Not eating  out regularly Exercise:  was doing walking video 30 min, 3/7 days a week, none recently        Dietician visit: Most recent: Several years ago.               Weight history: Previous range 200-260  Wt Readings from Last 3 Encounters:  02/12/14 224 lb 12.8 oz (101.969 kg)  01/08/14 225 lb 6.4 oz (102.241 kg)  01/02/14 220 lb (99.791 kg)   LABS:  No visits with results within 1 Week(s) from this visit. Latest known visit with results is:  Appointment on 01/03/2014  Component Date Value Ref Range Status  . Sodium 01/03/2014 135  135 - 145 mEq/L Final  . Potassium 01/03/2014 3.3* 3.5 - 5.1 mEq/L Final  . Chloride 01/03/2014 99  96 - 112 mEq/L Final  .  CO2 01/03/2014 26  19 - 32 mEq/L Final  . Glucose, Bld 01/03/2014 86  70 - 99 mg/dL Final  . BUN 01/03/2014 28* 6 - 23 mg/dL Final  . Creatinine, Ser 01/03/2014 1.1  0.4 - 1.2 mg/dL Final  . Calcium 01/03/2014 9.2  8.4 - 10.5 mg/dL Final  . GFR 01/03/2014 63.76  >60.00 mL/min Final  . Hgb A1c MFr Bld 01/03/2014 7.1* 4.6 - 6.5 % Final   Glycemic Control Guidelines for People with Diabetes:Non Diabetic:  <6%Goal of Therapy: <7%Additional Action Suggested:  >8%        Medication List       This list is accurate as of: 02/12/14 11:18 AM.  Always use your most recent med list.               aspirin 81 MG tablet  Take 81 mg by mouth daily.     atorvastatin 10 MG tablet  Commonly known as:  LIPITOR  Take 1 tablet (10 mg total) by mouth daily.     atorvastatin 10 MG tablet  Commonly known as:  LIPITOR  TAKE ONE TABLET BY MOUTH ONCE DAILY     glimepiride 4 MG tablet  Commonly known as:  AMARYL  Take 1 tablet (4 mg total) by mouth 2 (two) times daily.     glucose blood test strip  Commonly known as:  ONE TOUCH ULTRA TEST  Use as instructed to check blood sugar once a day dx code 250.02     hydrocortisone valerate cream 0.2 %  Commonly known as:  WESTCORT     losartan 50 MG tablet  Commonly known as:  COZAAR  Take 1 tablet (50 mg total) by mouth daily.     metFORMIN 1000 MG tablet  Commonly known as:  GLUCOPHAGE  Take 1 tablet (1,000 mg total) by mouth 2 (two) times daily with a meal.     metFORMIN 1000 MG tablet  Commonly known as:  GLUCOPHAGE  TAKE ONE TABLET BY MOUTH TWICE DAILY WITH MEALS     triamterene-hydrochlorothiazide 37.5-25 MG per tablet  Commonly known as:  MAXZIDE-25  Take 1 tablet by mouth daily.     triamterene-hydrochlorothiazide 37.5-25 MG per tablet  Commonly known as:  MAXZIDE-25  Take 1 tablet by mouth daily.     TRULICITY A999333 0000000 Sopn  Generic drug:  Dulaglutide  1 Dose once a week.        Allergies:  Allergies  Allergen  Reactions  . Avapro [Irbesartan] Other (See Comments)    headaches  . Codeine Nausea And Vomiting    Past Medical History  Diagnosis Date  . Diabetes mellitus   .  Asthma   . Hypertension   . Abnormal Pap smear   . LGSIL (low grade squamous intraepithelial dysplasia)     Past Surgical History  Procedure Laterality Date  . Abdominal hysterectomy    . Bladder surgery    . Colposcopy      Family History  Problem Relation Age of Onset  . Diabetes Mother   . Diabetes Maternal Grandmother   . Heart disease Neg Hx   . Hypertension Neg Hx     Social History:  reports that she has never smoked. She has never used smokeless tobacco. She reports that she does not drink alcohol or use illicit drugs.    Review of Systems   Hypertension: She has followed up with her PCP   Blood pressure is relatively higher today and she thinks this is from anxiety.  She does not know what her blood pressure was with her PCP a week ago.  Her Maxide was resumed on her last visit Also has tendency to hypokalemia.      Lipids: She has been  on Lipitor, LDL has been previously high, around 180       Lab Results  Component Value Date   CHOL 181 10/28/2013   HDL 42.80 10/28/2013   LDLCALC 111* 10/28/2013   TRIG 134.0 10/28/2013   CHOLHDL 4 10/28/2013               She does have a history of mild Numbness but no tingling or burning in feet    Diabetic foot exam 0000000   Complications from diabetes: Proliferative retinopathy, microalbuminuria   Physical Examination:  BP 144/86 mmHg  Temp(Src) 98.9 F (37.2 C) (Oral)  Wt 224 lb 12.8 oz (101.969 kg)      ASSESSMENT:  Diabetes type 2, with obesity Her blood sugars are under fair control with using Trulicity A999333 mg and she is now not having any nausea Her A1c last month was reasonably good Currently she is not exercising and emphasized the need to do this consistently Also she is taking her Amaryl in the morning even though she has  inconsistent meal intake at breakfast and lunch and her fasting readings are relatively high  Hypertension: Blood pressure is relatively higher today and she thinks this is from anxiety. She does not know what her blood pressure was with her PCP For now will continue same regimen but consider increasing losartan space disease and she has history of microalbuminuria Explained to her that she will need to contact of medications for optimal control  PLAN:   Start checking some readings after evening meal  Change Amaryl to suppertime  , More postprandial readings as discussed      Taralyn Ferraiolo 02/12/2014, 11:18 AM   Note: This office note was prepared with Estate agent. Any transcriptional errors that result from this process are unintentional.

## 2014-02-12 NOTE — Progress Notes (Signed)
Pre visit review using our clinic review tool, if applicable. No additional management support is needed unless otherwise documented below in the visit note. 

## 2014-03-04 ENCOUNTER — Telehealth: Payer: Self-pay | Admitting: *Deleted

## 2014-03-04 NOTE — Telephone Encounter (Signed)
Only the nausea is related to Trulicity.  She can try switching to Tanzeum 30 mg weekly, will need to be instructed on the pen and co-pay card given

## 2014-03-04 NOTE — Telephone Encounter (Signed)
Patient called today, she said she's been taking her trulicity every Sunday, but after taking this injection she's started having pain under her left breast, sharp pains in her calves, nausea and vomiting and she is breaking out in hives.  Please advise

## 2014-03-05 NOTE — Telephone Encounter (Signed)
Patient will do some research on Tanzeum and call back if she wants to pursue it. I instructed her to call and make an appt with Vaughan Basta if she decides to try it.

## 2014-03-26 DIAGNOSIS — D649 Anemia, unspecified: Secondary | ICD-10-CM | POA: Diagnosis not present

## 2014-03-26 DIAGNOSIS — L299 Pruritus, unspecified: Secondary | ICD-10-CM | POA: Diagnosis not present

## 2014-04-01 DIAGNOSIS — D573 Sickle-cell trait: Secondary | ICD-10-CM | POA: Diagnosis not present

## 2014-04-01 DIAGNOSIS — D649 Anemia, unspecified: Secondary | ICD-10-CM | POA: Diagnosis not present

## 2014-04-04 DIAGNOSIS — D649 Anemia, unspecified: Secondary | ICD-10-CM | POA: Diagnosis not present

## 2014-04-22 DIAGNOSIS — R21 Rash and other nonspecific skin eruption: Secondary | ICD-10-CM | POA: Diagnosis not present

## 2014-05-06 ENCOUNTER — Emergency Department (HOSPITAL_COMMUNITY)
Admission: EM | Admit: 2014-05-06 | Discharge: 2014-05-06 | Disposition: A | Discharge status: Home / Self Care | Payer: Medicare Other | Attending: Emergency Medicine | Admitting: Emergency Medicine

## 2014-05-06 ENCOUNTER — Encounter (HOSPITAL_COMMUNITY): Payer: Self-pay | Admitting: *Deleted

## 2014-05-06 DIAGNOSIS — Z79899 Other long term (current) drug therapy: Secondary | ICD-10-CM | POA: Diagnosis not present

## 2014-05-06 DIAGNOSIS — Z7982 Long term (current) use of aspirin: Secondary | ICD-10-CM | POA: Insufficient documentation

## 2014-05-06 DIAGNOSIS — E119 Type 2 diabetes mellitus without complications: Secondary | ICD-10-CM | POA: Insufficient documentation

## 2014-05-06 DIAGNOSIS — I10 Essential (primary) hypertension: Secondary | ICD-10-CM | POA: Insufficient documentation

## 2014-05-06 DIAGNOSIS — R21 Rash and other nonspecific skin eruption: Secondary | ICD-10-CM | POA: Diagnosis present

## 2014-05-06 DIAGNOSIS — J45909 Unspecified asthma, uncomplicated: Secondary | ICD-10-CM | POA: Insufficient documentation

## 2014-05-06 MED ORDER — DIPHENHYDRAMINE HCL 25 MG PO TABS: 25.0000 mg | ORAL_TABLET | Freq: Four times a day (QID) | ORAL | Status: DC

## 2014-05-06 MED ORDER — PREDNISONE 20 MG PO TABS: ORAL_TABLET | ORAL | Status: DC

## 2014-05-06 MED ORDER — DOXYCYCLINE HYCLATE 100 MG PO CAPS: 100.0000 mg | ORAL_CAPSULE | Freq: Two times a day (BID) | ORAL | Status: DC

## 2014-05-06 NOTE — Discharge Instructions (Signed)

## 2014-05-06 NOTE — ED Notes (Signed)
Pt c/o itching and hives x30 days. States she saw her PCP and was instructed to take allergra. Pt states she has not had any relief since starting medication.

## 2014-05-06 NOTE — ED Provider Notes (Signed)
CSN: [id number]     Arrival date & time 05/06/14  1800 History  This chart was scribed for non-physician practitioner, Domenic Moras, working with Carmin Muskrat, MD by Molli Posey, ED Scribe. This patient was seen in room TR10C/TR10C and the patient's care was started at 6:47 PM.    Chief Complaint  Patient presents with  . Pruritis  . Urticaria   HPI HPI Comments: Stephanie [name] is a 59 y.o. female with a history of DM, asthma and HTN who presents to the Emergency Department complaining of hives for approximately 1 month. Pt states that her hives initially started on her right upper arm but are now throughout her entire body. She reports the affected areas itch and sting "like a burning." She reports that last night she was nauseated and vomited once. Pt states that she was advised by her PCP on 03/26/14 to try allegra, claritin and other antihistamine medications and says that nothing she tried provided her relief. Pt states she has tried OTC medications with no relief as well. She reports she takes HCTZ and metformin for her DM management. Pt reports no new exposures from pets or detergents. Pt reports she lives with her grandson who is not experiencing similar symptoms. She denies trouble breathing, HA and abdominal cramping.    Past Medical History  Diagnosis Date  . Diabetes mellitus   . Asthma   . Hypertension   . Abnormal Pap smear   . LGSIL (low grade squamous intraepithelial dysplasia)    Past Surgical History  Procedure Laterality Date  . Abdominal hysterectomy    . Bladder surgery    . Colposcopy     Family History  Problem Relation Age of Onset  . Diabetes Mother   . Diabetes Maternal Grandmother   . Heart disease Neg Hx   . Hypertension Neg Hx    History  Substance Use Topics  . Smoking status: Never Smoker   . Smokeless tobacco: Never Used  . Alcohol Use: No   OB History    Gravida Para Term Preterm AB TAB SAB Ectopic Multiple Living   2 2        2       Review of Systems  Gastrointestinal: Negative for abdominal pain.  Skin: Positive for rash.  Neurological: Negative for headaches.   Allergies  Avapro and Codeine  Home Medications   Prior to Admission medications   Medication Sig Start Date End Date Taking? Authorizing Provider  aspirin 81 MG tablet Take 81 mg by mouth daily.      Historical Provider, MD  atorvastatin (LIPITOR) 10 MG tablet Take 1 tablet (10 mg total) by mouth daily. 12/25/13   Elayne Snare, MD  atorvastatin (LIPITOR) 10 MG tablet TAKE ONE TABLET BY MOUTH ONCE DAILY 01/22/14   Elayne Snare, MD  glimepiride (AMARYL) 4 MG tablet Take 1 tablet (4 mg total) by mouth 2 (two) times daily. 11/27/13   Elayne Snare, MD  glucose blood (ONE TOUCH ULTRA TEST) test strip Use as instructed to check blood sugar once a day dx code 250.02 10/07/13   Elayne Snare, MD  hydrocortisone valerate cream (WESTCORT) 0.2 %  06/28/13   Historical Provider, MD  losartan (COZAAR) 50 MG tablet Take 1 tablet (50 mg total) by mouth daily. 12/25/13   Elayne Snare, MD  metFORMIN (GLUCOPHAGE) 1000 MG tablet Take 1 tablet (1,000 mg total) by mouth 2 (two) times daily with a meal. 12/25/13   Elayne Snare, MD  triamterene-hydrochlorothiazide [facility]) 37.5-25  MG per tablet Take 1 tablet by mouth daily. 01/08/14   Elayne Snare, MD  triamterene-hydrochlorothiazide (MAXZIDE-25) 37.5-25 MG per tablet Take 1 tablet by mouth daily. 01/08/14   Elayne Snare, MD  TRULICITY [id number] [id number] SOPN 1 Dose once a week.  01/21/14   Historical Provider, MD   BP 161/81 mmHg  Pulse 108  Temp(Src) 98.1 F (36.7 C) (Oral)  Resp 18  Ht 5\' 3"  (1.6 m)  Wt 213 lb (96.616 kg)  BMI 37.74 kg/m2  SpO2 96% Physical Exam  Constitutional: She is oriented to person, place, and time. She appears well-developed and well-nourished.  HENT:  Head: Normocephalic and atraumatic.  Eyes: Right eye exhibits no discharge. Left eye exhibits no discharge.  Neck: No tracheal deviation present.  Cardiovascular:  Normal rate.   Pulmonary/Chest: Effort normal. No respiratory distress.  Abdominal: She exhibits no distension.  Neurological: She is alert and oriented to person, place, and time.  Skin: Skin is warm and dry.  Macular papular lesions noted throughout both forearms with excoriation marks throughout. Similar rash on the upper back. No rash in palms of hands. No rash in mouth.  Psychiatric: She has a normal mood and affect. Her behavior is normal.  Nursing note and vitals reviewed.   ED Course  Procedures   DIAGNOSTIC STUDIES: Oxygen Saturation is 96% on RA, normal by my interpretation.    COORDINATION OF CARE: 6:55 PM Discussed treatment plan with pt at bedside and pt agreed to plan. Will start pt on doxycyline and steroid medication and advised her to follow up with dermatologist or her PCP. Pt is diabetic, she's aware to monitor her CBG carefully while on steroid.  Otherwise rash without red flags.    Labs Review Labs Reviewed - No data to display  Imaging Review No results found.   EKG Interpretation None      MDM   Final diagnoses:  Rash   BP 161/81 mmHg  Pulse 108  Temp(Src) 98.1 F (36.7 C) (Oral)  Resp 18  Ht 5\' 3"  (1.6 m)  Wt 213 lb (96.616 kg)  BMI 37.74 kg/m2  SpO2 96%   I personally performed the services described in this documentation, which was scribed in my presence. The recorded information has been reviewed and is accurate.       Domenic Moras, PA-C 05/06/14 1901  Carmin Muskrat, MD 05/06/14 [phone]

## 2014-05-08 DIAGNOSIS — R1110 Vomiting, unspecified: Secondary | ICD-10-CM | POA: Diagnosis not present

## 2014-05-08 DIAGNOSIS — R1031 Right lower quadrant pain: Secondary | ICD-10-CM | POA: Diagnosis not present

## 2014-05-09 ENCOUNTER — Other Ambulatory Visit (INDEPENDENT_AMBULATORY_CARE_PROVIDER_SITE_OTHER): Payer: Medicare Other

## 2014-05-09 DIAGNOSIS — L501 Idiopathic urticaria: Secondary | ICD-10-CM | POA: Diagnosis not present

## 2014-05-09 DIAGNOSIS — IMO0002 Reserved for concepts with insufficient information to code with codable children: Secondary | ICD-10-CM

## 2014-05-09 DIAGNOSIS — R21 Rash and other nonspecific skin eruption: Secondary | ICD-10-CM | POA: Diagnosis not present

## 2014-05-09 DIAGNOSIS — E1165 Type 2 diabetes mellitus with hyperglycemia: Secondary | ICD-10-CM | POA: Diagnosis not present

## 2014-05-09 LAB — COMPREHENSIVE METABOLIC PANEL
ALT: 17 U/L (ref 0–35)
AST: 13 U/L (ref 0–37)
Albumin: 4 g/dL (ref 3.5–5.2)
Alkaline Phosphatase: 79 U/L (ref 39–117)
BUN: 46 mg/dL — ABNORMAL HIGH (ref 6–23)
CO2: 22 mEq/L (ref 19–32)
Calcium: 9.7 mg/dL (ref 8.4–10.5)
Chloride: 92 mEq/L — ABNORMAL LOW (ref 96–112)
Creatinine, Ser: 3.39 mg/dL — ABNORMAL HIGH (ref 0.40–1.20)
GFR: 17.74 mL/min — ABNORMAL LOW (ref >60.00)
Glucose, Bld: 275 mg/dL — ABNORMAL HIGH (ref 70–99)
Potassium: 4.3 mEq/L (ref 3.5–5.1)
Sodium: 127 mEq/L — ABNORMAL LOW (ref 135–145)
Total Bilirubin: 0.5 mg/dL (ref 0.2–1.2)
Total Protein: 8.2 g/dL (ref 6.0–8.3)

## 2014-05-09 LAB — URINALYSIS, ROUTINE W REFLEX MICROSCOPIC
Bilirubin Urine: NEGATIVE
Ketones, ur: NEGATIVE
Leukocytes, UA: NEGATIVE
Nitrite: NEGATIVE
Specific Gravity, Urine: 1.025 (ref 1.000–1.030)
Urine Glucose: NEGATIVE
Urobilinogen, UA: 0.2 (ref 0.0–1.0)
WBC, UA: NONE SEEN (ref 0–?)
pH: 5.5 (ref 5.0–8.0)

## 2014-05-09 LAB — MICROALBUMIN / CREATININE URINE RATIO
Creatinine,U: 237.9 mg/dL
Microalb Creat Ratio: 6 mg/g (ref 0.0–30.0)
Microalb, Ur: 14.2 mg/dL — ABNORMAL HIGH (ref 0.0–1.9)

## 2014-05-09 LAB — HEMOGLOBIN A1C: Hgb A1c MFr Bld: 11.2 % — ABNORMAL HIGH (ref 4.6–6.5)

## 2014-05-14 ENCOUNTER — Encounter: Payer: Medicare Other | Attending: Endocrinology | Admitting: Nutrition

## 2014-05-14 ENCOUNTER — Ambulatory Visit (INDEPENDENT_AMBULATORY_CARE_PROVIDER_SITE_OTHER): Payer: Medicare Other | Admitting: Endocrinology

## 2014-05-14 ENCOUNTER — Encounter: Payer: Self-pay | Admitting: Endocrinology

## 2014-05-14 ENCOUNTER — Other Ambulatory Visit: Payer: Self-pay | Admitting: *Deleted

## 2014-05-14 VITALS — BP 137/84 | HR 87 | Temp 97.9°F | Resp 14 | Ht 63.0 in | Wt 216.0 lb

## 2014-05-14 DIAGNOSIS — IMO0002 Reserved for concepts with insufficient information to code with codable children: Secondary | ICD-10-CM

## 2014-05-14 DIAGNOSIS — Z794 Long term (current) use of insulin: Secondary | ICD-10-CM | POA: Diagnosis not present

## 2014-05-14 DIAGNOSIS — Z713 Dietary counseling and surveillance: Secondary | ICD-10-CM | POA: Insufficient documentation

## 2014-05-14 DIAGNOSIS — E1165 Type 2 diabetes mellitus with hyperglycemia: Secondary | ICD-10-CM

## 2014-05-14 DIAGNOSIS — N179 Acute kidney failure, unspecified: Secondary | ICD-10-CM

## 2014-05-14 MED ORDER — INSULIN PEN NEEDLE 32G X 4 MM MISC: Status: DC

## 2014-05-14 MED ORDER — INSULIN LISPRO PROT & LISPRO (75-25 MIX) 100 UNIT/ML KWIKPEN: PEN_INJECTOR | SUBCUTANEOUS | Status: DC

## 2014-05-14 NOTE — Patient Instructions (Addendum)
Stop Metformin and Triam- HCTZ  Start Humalog mix 14 in am and 10 at dinner RIGHT AT MEALTIME  Please check blood sugars BEFORE MEALS 1-2X DAILY AND ONCE at least half the time about 2 hours after any meal and 3-4 times per week on waking up.  Please bring blood sugar monitor to each visit.  Recommended blood sugar levels about 2 hours after meal is 140-180 and on waking up 90-130

## 2014-05-14 NOTE — Progress Notes (Signed)
Patient is here to start insulin--humaog 75/25.   She was instructed on how to use the pen, how/when to take the insulin and how to adjust the insulin dose.  She redemonstrated how to use the pen, and reported good understanding of this.  She had no questions.  We reviewed low blood sugars--symptoms and treatments, and she was given a bd pen starter kit with directions for pen use, as well as low blood sugar symptoms and treatment.  She was told to test blood sugars before breakfast and supper, and to increase the PM dose of insulin by 2 units q 3 days until FBSs come down below 130.  She agreed to do this. 

## 2014-05-14 NOTE — Patient Instructions (Signed)
Take 14u of insulin 5-10 min. Before breakfast, and 10u before supper Test blood sugars before breakfast and supper. Increase PM dose of insulin by 1-2u every 3 days until fasting blood sugar is less than 120

## 2014-05-14 NOTE — Progress Notes (Signed)
Patient ID: Stephanie [name], female   DOB: [birth year], 59 y.o.   MRN: [id number]           Reason for Appointment: Followup   Referring physician: Hulan Fess  History of Present Illness:          Diagnosis: Type 2 diabetes mellitus, date of diagnosis: 2003       Past history:  She was initially treated metformin at some point glimepiride also added and not clear what her level of control was in the first few years. Had been only taking 500 mg twice a day of metformin and Amaryl increased to 4 mg twice a day several years ago In the last few years her blood sugar control has been more difficult and she has been managed by an endocrinologist Because of poor control she had been tried on Janumet and [place] but she states that she could not tolerate them because of headaches She does not think she has been given any other medications   Recent history:  Because of inadequate control with Amaryl and metformin and also difficulty with weight gain she had started Trulicity in [id number] Her weight on her initial visit was 240 pounds.  She was asked to switch to Tanzeum in December because of nausea but she wanted to try and continue on Trulicity probably because of higher cost.  She was also shown how to do the Tanzeum injection On her follow-up visit in 01/2014 she was not having nausea and her blood sugars were improved significantly, averaging about 133 Her A1c had gone down to 7.1% in 12/15  Current history, problems and blood sugar readings:  She says that for the last few weeks she has had some itching problems and she stopped taking her Amaryl  Also she started having nausea again and she stopped taking her Trulicity a couple of months ago but did not call  Her blood sugars have increased markedly with stopping her Amaryl and Trulicity and she is taking only metformin; she is also symptomatic with increased thirst and urination; she also has lost weight as expected for her high  sugars  She is checking blood sugars mostly in the mornings and mid day when these are consistently very high especially since 05/02/14 and has high as 521 when she had steroids for her itching Blood sugars are now averaging 353, range 350-527       Oral hypoglycemic drugs the patient is taking are: Amaryl 2 mg q am, metformin 1000 mg twice a day, Invokana: Candidiasis      Side effects from medications have been: Januvia, Onglyza apparently caused headaches Compliance with the medical regimen: Fair   Hypoglycemia: none  Glucose monitoring:  done 0.8 time a day         Glucometer: One Touch Ultra.      Blood Glucose readings by meter download as above   Glycemic control:   Lab Results  Component Value Date   HGBA1C 11.2* 05/09/2014   HGBA1C 7.1* 01/03/2014   HGBA1C 8.6* 10/28/2013   Lab Results  Component Value Date   MICROALBUR 14.2* 05/09/2014   LDLCALC 111* 10/28/2013   CREATININE 3.39* 05/09/2014    Retinal exam: Most recent:7/15.    Self-care: The diet that the patient has been following is: tries to limit fats.      Meals: 2-3 meals per day. Breakfast is cereal or scrambled eggs. Lunch is half sandwich, yogurt and fruit; dinner usually baked chicken, bread and vegetables, snacks are  fruits or nuts. Not eating out regularly Exercise: none recently        Dietician visit: Most recent: Several years ago.  CDE visit: 12/15              Weight history: Previous range 200-260  Wt Readings from Last 3 Encounters:  05/14/14 216 lb (97.977 kg)  05/06/14 213 lb (96.616 kg)  02/12/14 224 lb 12.8 oz (101.969 kg)    RENAL dysfunction: See review of systems for discussion   LABS:  Lab on 05/09/2014  Component Date Value Ref Range Status  . Hgb A1c MFr Bld 05/09/2014 11.2* 4.6 - 6.5 % Final   Glycemic Control Guidelines for People with Diabetes:Non Diabetic:  <6%Goal of Therapy: <7%Additional Action Suggested:  >8%   . Sodium 05/09/2014 127* 135 - 145 mEq/L Final  .  Potassium 05/09/2014 4.3  3.5 - 5.1 mEq/L Final  . Chloride 05/09/2014 92* 96 - 112 mEq/L Final  . CO2 05/09/2014 22  19 - 32 mEq/L Final  . Glucose, Bld 05/09/2014 275* 70 - 99 mg/dL Final  . BUN 05/09/2014 46* 6 - 23 mg/dL Final  . Creatinine, Ser 05/09/2014 3.39* 0.40 - 1.20 mg/dL Final  . Total Bilirubin 05/09/2014 0.5  0.2 - 1.2 mg/dL Final  . Alkaline Phosphatase 05/09/2014 79  39 - 117 U/L Final  . AST 05/09/2014 13  0 - 37 U/L Final  . ALT 05/09/2014 17  0 - 35 U/L Final  . Total Protein 05/09/2014 8.2  6.0 - 8.3 g/dL Final  . Albumin 05/09/2014 4.0  3.5 - 5.2 g/dL Final  . Calcium 05/09/2014 9.7  8.4 - 10.5 mg/dL Final  . GFR 05/09/2014 17.74* >60.00 mL/min Final  . Microalb, Ur 05/09/2014 14.2* 0.0 - 1.9 mg/dL Final  . Creatinine,U 05/09/2014 237.9   Final  . Microalb Creat Ratio 05/09/2014 6.0  0.0 - 30.0 mg/g Final  . Color, Urine 05/09/2014 YELLOW  Yellow;Lt. Yellow Final  . APPearance 05/09/2014 Cloudy* Clear Final  . Specific Gravity, Urine 05/09/2014 1.025  1.000-1.030 Final  . pH 05/09/2014 5.5  5.0 - 8.0 Final  . Total Protein, Urine 05/09/2014 TRACE* Negative Final  . Urine Glucose 05/09/2014 NEGATIVE  Negative Final  . Ketones, ur 05/09/2014 NEGATIVE  Negative Final  . Bilirubin Urine 05/09/2014 NEGATIVE  Negative Final  . Hgb urine dipstick 05/09/2014 TRACE-INTACT* Negative Final  . Urobilinogen, UA 05/09/2014 0.2  0.0 - 1.0 Final  . Leukocytes, UA 05/09/2014 NEGATIVE  Negative Final  . Nitrite 05/09/2014 NEGATIVE  Negative Final  . WBC, UA 05/09/2014 none seen  0-2/hpf Final  . RBC / HPF 05/09/2014 0-2/hpf  0-2/hpf Final  . Mucus, UA 05/09/2014 amorphous urates* None Final  . Squamous Epithelial / LPF 05/09/2014 Rare(0-4/hpf)  Rare(0-4/hpf) Final       Medication List       This list is accurate as of: 05/14/14  4:01 PM.  Always use your most recent med list.               aspirin 81 MG tablet  Take 81 mg by mouth daily.     atorvastatin 10 MG  tablet  Commonly known as:  LIPITOR  Take 1 tablet (10 mg total) by mouth daily.     atorvastatin 10 MG tablet  Commonly known as:  LIPITOR  TAKE ONE TABLET BY MOUTH ONCE DAILY     diphenhydrAMINE 25 MG tablet  Commonly known as:  BENADRYL  Take 1 tablet (25 mg  total) by mouth every 6 (six) hours.     doxycycline 100 MG capsule  Commonly known as:  VIBRAMYCIN  Take 1 capsule (100 mg total) by mouth 2 (two) times daily. One po bid x 7 days     glimepiride 4 MG tablet  Commonly known as:  AMARYL  Take 1 tablet (4 mg total) by mouth 2 (two) times daily.     glucose blood test strip  Commonly known as:  ONE TOUCH ULTRA TEST  Use as instructed to check blood sugar once a day dx code 250.02     hydrocortisone valerate cream 0.2 %  Commonly known as:  WESTCORT     Insulin Lispro Prot & Lispro (75-25) 100 UNIT/ML Kwikpen  Commonly known as:  HUMALOG MIX 75/25 KWIKPEN  Inject 14 units at breakfast and 10 units at supper     Insulin Pen Needle 32G X 4 MM Misc  Commonly known as:  BD PEN NEEDLE NANO U/F  Use 2 per day     losartan 50 MG tablet  Commonly known as:  COZAAR  Take 1 tablet (50 mg total) by mouth daily.     metFORMIN 1000 MG tablet  Commonly known as:  GLUCOPHAGE  Take 1 tablet (1,000 mg total) by mouth 2 (two) times daily with a meal.     predniSONE 20 MG tablet  Commonly known as:  DELTASONE  2 tabs po daily x 4 days     triamterene-hydrochlorothiazide 37.5-25 MG per tablet  Commonly known as:  MAXZIDE-25  Take 1 tablet by mouth daily.     TRULICITY [id number] 0000000 Sopn  Generic drug:  Dulaglutide  1 Dose once a week.        Allergies:  Allergies  Allergen Reactions  . Avapro [Irbesartan] Other (See Comments)    headaches  . Codeine Nausea And Vomiting    Past Medical History  Diagnosis Date  . Diabetes mellitus   . Asthma   . Hypertension   . Abnormal Pap smear   . LGSIL (low grade squamous intraepithelial dysplasia)     Past Surgical  History  Procedure Laterality Date  . Abdominal hysterectomy    . Bladder surgery    . Colposcopy      Family History  Problem Relation Age of Onset  . Diabetes Mother   . Diabetes Maternal Grandmother   . Heart disease Neg Hx   . Hypertension Neg Hx     Social History:  reports that she has never smoked. She has never used smokeless tobacco. She reports that she does not drink alcohol or use illicit drugs.    Review of Systems   ACUTE renal insufficiency:  Her creatinine is markedly higher for no apparent reason.  She has not taken any nonsteroidal anti-inflammatory drugs, over-the-counter supplements.  Her only new medication was doxycycline given on 05/06/14. She has not had any intercurrent illness causing dehydration Currently taking only Maxzide for hypertension, not on losartan   Lab Results  Component Value Date   CREATININE 3.39* 05/09/2014   CREATININE 1.1 01/03/2014   CREATININE 0.80 01/02/2014     Hypertension: She has followed up with her PCP   Blood pressure is high normal She is followed by PCP She is taking Maxzide since last visit Also has tendency to hypokalemia in the past.      Lipids: She had been  on Lipitor, LDL has been previously high, around 180       Lab Results  Component Value Date   CHOL 181 10/28/2013   HDL 42.80 10/28/2013   LDLCALC 111* 10/28/2013   TRIG 134.0 10/28/2013   CHOLHDL 4 10/28/2013               She does have a history of mild Numbness but no tingling or burning in feet    Diabetic foot exam 0000000  Complications from diabetes: Proliferative retinopathy, microalbuminuria  PRURITUS: She is having rash and itching on her skin and has not had any relief from treatment given by allergist so far   Physical Examination:  BP 137/84 mmHg  Pulse 87  Temp(Src) 97.9 F (36.6 C)  Resp 14  Ht 5\' 3"  (1.6 m)  Wt 216 lb (97.977 kg)  BMI 38.27 kg/m2  SpO2 97%      ASSESSMENT:  Diabetes type 2, with obesity Her blood  sugars are totally out of control with A1c 11% and home blood sugars averaging 350 recently She is somewhat symptomatic from her hyperglycemia but probably not as much because of her renal dysfunction Patient did not report her high readings and has been on her own stopping her medications Currently taking only metformin Discussed with patient that since she has marked hyperglycemia she will need to be on insulin to control her blood sugars quickly and no other medication will work effectively.  Also discussed contraindication to taking metformin currently Most likely she was not having any allergic reaction to Amaryl as she had taken this previously without problems and she continues to have itching with stopping this also  RENAL dysfunction: Will need to refer to nephrologist as etiology of marked rise in creatinine is not explained in any way today Currently creatinine clearance is only 17  Hypertension: Blood pressure is fairly well controlled right now  PLAN:   Discussed principles of insulin administration.  Discussed how basal and bolus insulins need to work.  For simplicity will try her on premixed insulin twice a day  She will be instructed in detail by the nurse educator today  She will start with 14 units in the morning before breakfast and 10 units before supper, discussed timing of insulin  May need to adjust the dose every 3 days based on blood sugar  STOP metformin  She will need to follow-up in 2 weeks  Urgent renal consultation will be done  Stop Maxzide as it is likely to be effective anyway with her renal function  Counseling time over 50% of today's 25 minute visit  Prescilla Monger 05/14/2014, 4:01 PM   Note: This office note was prepared with Estate agent. Any transcriptional errors that result from this process are unintentional.

## 2014-05-15 ENCOUNTER — Telehealth: Payer: Self-pay | Admitting: *Deleted

## 2014-05-15 NOTE — Telephone Encounter (Signed)
She can take extra strength Tylenol otherwise ask her PCP

## 2014-05-15 NOTE — Telephone Encounter (Signed)
Patient called, she wants to know since she's off the Triamterene-HCTZ and the kidney Dr. Does not want her taking any Nsaids, what should she take if she gets a headache?

## 2014-05-15 NOTE — Telephone Encounter (Signed)
Noted patient is aware 

## 2014-05-20 DIAGNOSIS — I129 Hypertensive chronic kidney disease with stage 1 through stage 4 chronic kidney disease, or unspecified chronic kidney disease: Secondary | ICD-10-CM | POA: Diagnosis not present

## 2014-05-20 DIAGNOSIS — E1122 Type 2 diabetes mellitus with diabetic chronic kidney disease: Secondary | ICD-10-CM | POA: Diagnosis not present

## 2014-05-20 DIAGNOSIS — D631 Anemia in chronic kidney disease: Secondary | ICD-10-CM | POA: Diagnosis not present

## 2014-05-20 DIAGNOSIS — N179 Acute kidney failure, unspecified: Secondary | ICD-10-CM | POA: Diagnosis not present

## 2014-05-20 DIAGNOSIS — N189 Chronic kidney disease, unspecified: Secondary | ICD-10-CM | POA: Diagnosis not present

## 2014-05-21 ENCOUNTER — Other Ambulatory Visit: Payer: Self-pay | Admitting: Nephrology

## 2014-05-21 DIAGNOSIS — I129 Hypertensive chronic kidney disease with stage 1 through stage 4 chronic kidney disease, or unspecified chronic kidney disease: Secondary | ICD-10-CM

## 2014-05-21 DIAGNOSIS — N179 Acute kidney failure, unspecified: Secondary | ICD-10-CM

## 2014-05-23 ENCOUNTER — Ambulatory Visit
Admission: RE | Admit: 2014-05-23 | Discharge: 2014-05-23 | Disposition: A | Discharge status: Home / Self Care | Payer: Medicare Other | Source: Ambulatory Visit | Attending: Nephrology | Admitting: Nephrology

## 2014-05-23 DIAGNOSIS — I129 Hypertensive chronic kidney disease with stage 1 through stage 4 chronic kidney disease, or unspecified chronic kidney disease: Secondary | ICD-10-CM

## 2014-05-23 DIAGNOSIS — I10 Essential (primary) hypertension: Secondary | ICD-10-CM | POA: Diagnosis not present

## 2014-05-23 DIAGNOSIS — N179 Acute kidney failure, unspecified: Secondary | ICD-10-CM

## 2014-05-30 ENCOUNTER — Ambulatory Visit: Payer: Medicare Other | Admitting: Endocrinology

## 2014-06-02 ENCOUNTER — Ambulatory Visit (INDEPENDENT_AMBULATORY_CARE_PROVIDER_SITE_OTHER): Payer: Medicare Other | Admitting: Endocrinology

## 2014-06-02 ENCOUNTER — Encounter: Payer: Self-pay | Admitting: Endocrinology

## 2014-06-02 VITALS — BP 138/74 | HR 102 | Temp 98.5°F | Resp 14 | Ht 63.0 in | Wt 222.6 lb

## 2014-06-02 DIAGNOSIS — E1165 Type 2 diabetes mellitus with hyperglycemia: Secondary | ICD-10-CM

## 2014-06-02 DIAGNOSIS — IMO0002 Reserved for concepts with insufficient information to code with codable children: Secondary | ICD-10-CM

## 2014-06-02 MED ORDER — METFORMIN HCL 1000 MG PO TABS: 1000.0000 mg | ORAL_TABLET | Freq: Two times a day (BID) | ORAL | Status: DC

## 2014-06-02 NOTE — Patient Instructions (Addendum)
Increase pm dose to 14 units before supper  Please check blood sugars at least half the time about 2 hours after any meal and 3 times per week on waking up.  Please bring blood sugar monitor to each visit. Recommended blood sugar levels about 2 hours after meal is 140-180 and on waking up 90-130  Metformin 1/2 twice a day with food then after 3-4 days take 1 am and pm   Call sugars in 3 days or bring meter

## 2014-06-02 NOTE — Progress Notes (Signed)
Patient ID: Stephanie [name], female   DOB: August 01, 1955, 59 y.o.   MRN: [id number]           Reason for Appointment: Followup   Referring physician: Hulan Fess  History of Present Illness:          Diagnosis: Type 2 diabetes mellitus, date of diagnosis: 2003       Past history:  She was initially treated metformin at some point glimepiride also added and not clear what her level of control was in the first few years. Had been only taking 500 mg twice a day of metformin and Amaryl increased to 4 mg twice a day several years ago In the last few years her blood sugar control has been more difficult and she has been managed by an endocrinologist Because of poor control she had been tried on Janumet and [place] but she states that she could not tolerate them because of headaches  Because of inadequate control with Amaryl and metformin and also difficulty with weight gain she had started Trulicity in [id number] on her initial consultation Her weight on her initial visit was 240 pounds.  Recent history:   Her A1c had gone down to 7.1% in [id number] with using Trulicity which she was tolerating at that time However in 2/16 she stopped taking Trulicity as she thinks it was causing nausea Also around the same time because of problems with persistent itching she stopped her Amaryl and metformin also Blood sugars on her visit in 4/16 were averaging 350, highest 521  Because of high blood sugars she was started on premixed insulin twice a day which she has done without difficulty although had required multiple visits with the educator and nurse to start doing this. Again today she has multiple questions about taking insulin, dosage adjustment, types of insulin and timing of injection  Current history, problems and blood sugar readings:  She says that f her blood sugars are significantly improved in the afternoon and below 200  Glucose readings are still over 200 and the morning including today when it  was over 300  She does not remember what her readings are after supper, did not bring her monitor today  She is still not taking metformin  She still has not started any exercise despite reminders       Oral hypoglycemic drugs the patient is taking are: None     Side effects from medications have been: Januvia, Onglyza apparently caused headaches, Invokana: Candidiasis   Compliance with the medical regimen: Fair   Hypoglycemia: none  Glucose monitoring:  done 0.8 time a day         Glucometer: One Touch Ultra.      Blood Glucose readings by recall    PRE-MEAL Breakfast Lunch Dinner Bedtime Overall  Glucose range: 200-300  150s ?  200    Mean/median:           Glycemic control:   Lab Results  Component Value Date   HGBA1C 11.2* 05/09/2014   HGBA1C 7.1* 01/03/2014   HGBA1C 8.6* 10/28/2013   Lab Results  Component Value Date   MICROALBUR 14.2* 05/09/2014   LDLCALC 111* 10/28/2013   CREATININE 3.39* 05/09/2014    Retinal exam: Most recent:7/15.    Self-care: The diet that the patient has been following is: tries to limit fats.      Meals: 2-3 meals per day. Breakfast is cereal or scrambled eggs. Lunch is half sandwich, yogurt and fruit; dinner usually baked chicken, bread  and vegetables, snacks are fruits or nuts. Not eating out regularly Exercise: none recently        Dietician visit: Most recent: Several years ago.  CDE visit: 12/15              Weight history: Previous range 200-260  Wt Readings from Last 3 Encounters:  06/02/14 222 lb 9.6 oz (100.971 kg)  05/14/14 216 lb (97.977 kg)  05/06/14 213 lb (96.616 kg)    RENAL dysfunction: See review of systems for discussion   LABS:  No visits with results within 1 Week(s) from this visit. Latest known visit with results is:  Lab on 05/09/2014  Component Date Value Ref Range Status  . Hgb A1c MFr Bld 05/09/2014 11.2* 4.6 - 6.5 % Final   Glycemic Control Guidelines for People with Diabetes:Non Diabetic:   <6%Goal of Therapy: <7%Additional Action Suggested:  >8%   . Sodium 05/09/2014 127* 135 - 145 mEq/L Final  . Potassium 05/09/2014 4.3  3.5 - 5.1 mEq/L Final  . Chloride 05/09/2014 92* 96 - 112 mEq/L Final  . CO2 05/09/2014 22  19 - 32 mEq/L Final  . Glucose, Bld 05/09/2014 275* 70 - 99 mg/dL Final  . BUN 05/09/2014 46* 6 - 23 mg/dL Final  . Creatinine, Ser 05/09/2014 3.39* 0.40 - 1.20 mg/dL Final  . Total Bilirubin 05/09/2014 0.5  0.2 - 1.2 mg/dL Final  . Alkaline Phosphatase 05/09/2014 79  39 - 117 U/L Final  . AST 05/09/2014 13  0 - 37 U/L Final  . ALT 05/09/2014 17  0 - 35 U/L Final  . Total Protein 05/09/2014 8.2  6.0 - 8.3 g/dL Final  . Albumin 05/09/2014 4.0  3.5 - 5.2 g/dL Final  . Calcium 05/09/2014 9.7  8.4 - 10.5 mg/dL Final  . GFR 05/09/2014 17.74* >60.00 mL/min Final  . Microalb, Ur 05/09/2014 14.2* 0.0 - 1.9 mg/dL Final  . Creatinine,U 05/09/2014 237.9   Final  . Microalb Creat Ratio 05/09/2014 6.0  0.0 - 30.0 mg/g Final  . Color, Urine 05/09/2014 YELLOW  Yellow;Lt. Yellow Final  . APPearance 05/09/2014 Cloudy* Clear Final  . Specific Gravity, Urine 05/09/2014 1.025  1.000-1.030 Final  . pH 05/09/2014 5.5  5.0 - 8.0 Final  . Total Protein, Urine 05/09/2014 TRACE* Negative Final  . Urine Glucose 05/09/2014 NEGATIVE  Negative Final  . Ketones, ur 05/09/2014 NEGATIVE  Negative Final  . Bilirubin Urine 05/09/2014 NEGATIVE  Negative Final  . Hgb urine dipstick 05/09/2014 TRACE-INTACT* Negative Final  . Urobilinogen, UA 05/09/2014 0.2  0.0 - 1.0 Final  . Leukocytes, UA 05/09/2014 NEGATIVE  Negative Final  . Nitrite 05/09/2014 NEGATIVE  Negative Final  . WBC, UA 05/09/2014 none seen  0-2/hpf Final  . RBC / HPF 05/09/2014 0-2/hpf  0-2/hpf Final  . Mucus, UA 05/09/2014 amorphous urates* None Final  . Squamous Epithelial / LPF 05/09/2014 Rare(0-4/hpf)  Rare(0-4/hpf) Final       Medication List       This list is accurate as of: 06/02/14  4:41 PM.  Always use your most  recent med list.               aspirin 81 MG tablet  Take 81 mg by mouth daily.     atorvastatin 10 MG tablet  Commonly known as:  LIPITOR  Take 1 tablet (10 mg total) by mouth daily.     atorvastatin 10 MG tablet  Commonly known as:  LIPITOR  TAKE ONE TABLET BY MOUTH ONCE  DAILY     diphenhydrAMINE 25 MG tablet  Commonly known as:  BENADRYL  Take 1 tablet (25 mg total) by mouth every 6 (six) hours.     doxycycline 100 MG capsule  Commonly known as:  VIBRAMYCIN  Take 1 capsule (100 mg total) by mouth 2 (two) times daily. One po bid x 7 days     glimepiride 4 MG tablet  Commonly known as:  AMARYL  Take 1 tablet (4 mg total) by mouth 2 (two) times daily.     glucose blood test strip  Commonly known as:  ONE TOUCH ULTRA TEST  Use as instructed to check blood sugar once a day dx code 250.02     hydrocortisone valerate cream 0.2 %  Commonly known as:  WESTCORT     Insulin Lispro Prot & Lispro (75-25) 100 UNIT/ML Kwikpen  Commonly known as:  HUMALOG MIX 75/25 KWIKPEN  Inject 14 units at breakfast and 10 units at supper     Insulin Pen Needle 32G X 4 MM Misc  Commonly known as:  BD PEN NEEDLE NANO U/F  Use 2 per day     losartan 50 MG tablet  Commonly known as:  COZAAR  Take 1 tablet (50 mg total) by mouth daily.     metFORMIN 1000 MG tablet  Commonly known as:  GLUCOPHAGE  Take 1 tablet (1,000 mg total) by mouth 2 (two) times daily with a meal.     predniSONE 20 MG tablet  Commonly known as:  DELTASONE  2 tabs po daily x 4 days     triamterene-hydrochlorothiazide 37.5-25 MG per tablet  Commonly known as:  MAXZIDE-25  Take 1 tablet by mouth daily.     TRULICITY [id number] Sopn  Generic drug:  Dulaglutide  1 Dose once a week.        Allergies:  Allergies  Allergen Reactions  . Avapro [Irbesartan] Other (See Comments)    headaches  . Codeine Nausea And Vomiting    Past Medical History  Diagnosis Date  . Diabetes mellitus   . Asthma   .  Hypertension   . Abnormal Pap smear   . LGSIL (low grade squamous intraepithelial dysplasia)     Past Surgical History  Procedure Laterality Date  . Abdominal hysterectomy    . Bladder surgery    . Colposcopy      Family History  Problem Relation Age of Onset  . Diabetes Mother   . Diabetes Maternal Grandmother   . Heart disease Neg Hx   . Hypertension Neg Hx     Social History:  reports that she has never smoked. She has never used smokeless tobacco. She reports that she does not drink alcohol or use illicit drugs.    Review of Systems   ACUTE renal insufficiency:  Her creatinine was markedly higher for no apparent reason.   However on evaluation by nephrologist kidney function was normal and no etiology is found  Lab Results  Component Value Date   CREATININE 3.39* 05/09/2014   CREATININE 1.1 01/03/2014   CREATININE 0.80 01/02/2014     Hypertension: She has followed up with her PCP   Blood pressure is high normal She is followed by PCP She is taking Maxzide Also has tendency to hypokalemia in the past.  She is having some swelling in her legs at the end of the day      Lipids: She had been  on Lipitor, LDL has been previously high, around 180  Lab Results  Component Value Date   CHOL 181 10/28/2013   HDL 42.80 10/28/2013   LDLCALC 111* 10/28/2013   TRIG 134.0 10/28/2013   CHOLHDL 4 10/28/2013               She does have a history of mild Numbness but no tingling or burning in feet    Diabetic foot exam 0000000  Complications from diabetes: Proliferative retinopathy, microalbuminuria  PRURITUS: She is not having any itching now   Physical Examination:  BP 138/74 mmHg  Pulse 102  Temp(Src) 98.5 F (36.9 C)  Resp 14  Ht 5\' 3"  (1.6 m)  Wt 222 lb 9.6 oz (100.971 kg)  BMI 39.44 kg/m2  SpO2 94%   2+ ankle edema present    ASSESSMENT:  Diabetes type 2, with obesity Her blood sugars have been markedly increased with A1c 11%  recently Although with starting insulin her sugars appear to be better by recall she thinks they are still well over 200 in the morning As discussed above she still does not understand much about insulin and still has questions about it Various questions were answered regarding dosage adjustment, timing of injection, potential for weight gain and increased appetite as well as types of insulin  RENAL dysfunction:  Not clear of the etiology of this was very transient, also not clear if there was a lab error but her BUN was also increased with creatinine  Hypertension: Blood pressure is fairly well controlled right now  PLAN:   Increase suppertime dose to 14 units also  She needs to try take her insulin right before eating or up to 15 minutes before eating  Check blood sugar consistently after supper  She will bring her monitor for download on Thursday and we'll call her with any adjustments  Restart metformin as there is no renal dysfunction.  She was reassured that this should not cause itching as he had taken it for 13 years without a problem.  Also stressed importance of taking medications to improve insulin resistance when using insulin  Start exercise  Discuss problems with edema with PCP.  Offered her a diuretic instead of Maxzide but she refuses  Counseling time on subjects discussed above is over 50% of today's 25 minute visit   Mandeep Ferch 06/02/2014, 4:41 PM   Note: This office note was prepared with Estate agent. Any transcriptional errors that result from this process are unintentional.

## 2014-06-03 LAB — BASIC METABOLIC PANEL
BUN: 26 mg/dL — ABNORMAL HIGH (ref 6–23)
CO2: 28 mEq/L (ref 19–32)
Calcium: 10.3 mg/dL (ref 8.4–10.5)
Chloride: 98 mEq/L (ref 96–112)
Creatinine, Ser: 1.22 mg/dL — ABNORMAL HIGH (ref 0.40–1.20)
GFR: 57.69 mL/min — ABNORMAL LOW (ref >60.00)
Glucose, Bld: 244 mg/dL — ABNORMAL HIGH (ref 70–99)
Potassium: 4.2 mEq/L (ref 3.5–5.1)
Sodium: 134 mEq/L — ABNORMAL LOW (ref 135–145)

## 2014-06-03 NOTE — Progress Notes (Signed)
Quick Note:  Please let patient know that the kidney test is okay but blood sugar was 244 Increase morning insulin to 16 units, continue 14 at suppertime. Start metformin as planned  ______

## 2014-06-04 ENCOUNTER — Ambulatory Visit: Payer: Medicare Other | Admitting: Endocrinology

## 2014-06-04 DIAGNOSIS — L281 Prurigo nodularis: Secondary | ICD-10-CM | POA: Diagnosis not present

## 2014-06-04 DIAGNOSIS — L818 Other specified disorders of pigmentation: Secondary | ICD-10-CM | POA: Diagnosis not present

## 2014-06-05 ENCOUNTER — Encounter: Payer: Self-pay | Admitting: *Deleted

## 2014-06-06 ENCOUNTER — Other Ambulatory Visit: Payer: Self-pay | Admitting: *Deleted

## 2014-06-06 DIAGNOSIS — N179 Acute kidney failure, unspecified: Secondary | ICD-10-CM | POA: Diagnosis not present

## 2014-06-06 MED ORDER — FUROSEMIDE 40 MG PO TABS: 40.0000 mg | ORAL_TABLET | Freq: Every day | ORAL | Status: DC

## 2014-06-11 ENCOUNTER — Telehealth: Payer: Self-pay | Admitting: Endocrinology

## 2014-06-11 ENCOUNTER — Other Ambulatory Visit: Payer: Self-pay | Admitting: *Deleted

## 2014-06-11 MED ORDER — GLUCOSE BLOOD VI STRP: ORAL_STRIP | Status: DC

## 2014-06-11 NOTE — Telephone Encounter (Addendum)
Patient Need refill of one touch ultra test strips, she would like for you to call her.

## 2014-06-13 ENCOUNTER — Ambulatory Visit: Payer: Medicare Other | Admitting: Endocrinology

## 2014-06-16 ENCOUNTER — Encounter: Payer: Self-pay | Admitting: Endocrinology

## 2014-06-16 ENCOUNTER — Ambulatory Visit (INDEPENDENT_AMBULATORY_CARE_PROVIDER_SITE_OTHER): Payer: Medicare Other | Admitting: Endocrinology

## 2014-06-16 VITALS — BP 142/72 | HR 98 | Temp 98.6°F | Ht 63.0 in | Wt 222.4 lb

## 2014-06-16 DIAGNOSIS — E1165 Type 2 diabetes mellitus with hyperglycemia: Secondary | ICD-10-CM | POA: Diagnosis not present

## 2014-06-16 DIAGNOSIS — I10 Essential (primary) hypertension: Secondary | ICD-10-CM

## 2014-06-16 DIAGNOSIS — IMO0002 Reserved for concepts with insufficient information to code with codable children: Secondary | ICD-10-CM

## 2014-06-16 DIAGNOSIS — R600 Localized edema: Secondary | ICD-10-CM | POA: Diagnosis not present

## 2014-06-16 MED ORDER — ALBIGLUTIDE 30 MG ~~LOC~~ PEN: PEN_INJECTOR | SUBCUTANEOUS | Status: DC

## 2014-06-16 NOTE — Progress Notes (Signed)
Patient ID: Stephanie [name], female   DOB: [birth year], 59 y.o.   MRN: [id number]           Reason for Appointment: Followup   Referring physician: Hulan Fess  History of Present Illness:          Diagnosis: Type 2 diabetes mellitus, date of diagnosis: 2003       Past history:  She was initially treated metformin at some point glimepiride also added and not clear what her level of control was in the first few years. Had been only taking 500 mg twice a day of metformin and Amaryl increased to 4 mg twice a day several years ago In the last few years her blood sugar control has been more difficult and she has been managed by an endocrinologist Because of poor control she had been tried on Janumet and [employer] but she states that she could not tolerate them because of headaches  Because of inadequate control with Amaryl and metformin and also difficulty with weight gain she had started Trulicity in [id number] on her initial consultation Her weight on her initial visit was 240 pounds.  Recent history:   INSULIN DOSE: Humalog Mix 75/25, 16 before breakfast and 14 before supper   Her A1c had gone down to 7.1% in [id number] with using Trulicity which she was tolerating at that time However in 2/16 she stopped taking Trulicity as it was causing nausea Also around the same time because of problems with persistent itching she stopped her Amaryl and metformin also Blood sugars on her visit in 4/16 were averaging 350, highest 521 and A1c was up to 11.2  Because of markedly increased sugars she has been on premixed insulin since 4/16 Also with her renal function improving she was started back on metformin which she has not increased to 1000 mg twice a day without any side effects Her blood sugars have come down slowly with gradually increasing her insulin  Current history, problems and blood sugar readings:  She did have readings as high as 383 fasting about 2 weeks ago but they are now trending below  200; glucose 163 yesterday and 179 today  Blood sugars in the afternoons are also better in the last week with range 143-237  She has not checked any readings after evening meal except once or twice lately, previously as high as 465   She still has not started any exercise despite reminders  Overall median blood sugar in the last 4 weeks is 253       Oral hypoglycemic drugs the patient is taking are: None     Side effects from medications have been: Januvia, Onglyza apparently caused headaches, Invokana: Candidiasis   Compliance with the medical regimen: Fair   Hypoglycemia: none  Glucose monitoring:  done 1-2 time a day         Glucometer: One Touch Ultra.      Blood Glucose readings by download as above     Glycemic control:   Lab Results  Component Value Date   HGBA1C 11.2* 05/09/2014   HGBA1C 7.1* 01/03/2014   HGBA1C 8.6* 10/28/2013   Lab Results  Component Value Date   MICROALBUR 14.2* 05/09/2014   LDLCALC 111* 10/28/2013   CREATININE 1.22* 06/02/2014    Retinal exam: Most recent:7/15.    Self-care: The diet that the patient has been following is: tries to limit fats.      Meals: 2-3 meals per day. Breakfast is cereal or scrambled eggs. Lunch is  half sandwich, yogurt and fruit; dinner usually baked chicken, bread and vegetables, snacks are fruits or nuts. Not eating out regularly  Exercise: none         Dietician visit: Most recent: Several years ago.  CDE visit: 12/15              Weight history: Previous range 200-260  Wt Readings from Last 3 Encounters:  06/16/14 222 lb 6 oz (100.869 kg)  06/02/14 222 lb 9.6 oz (100.971 kg)  05/14/14 216 lb (97.977 kg)    RENAL dysfunction and edema: See review of systems for discussion   LABS:  No visits with results within 1 Week(s) from this visit. Latest known visit with results is:  Office Visit on 06/02/2014  Component Date Value Ref Range Status  . Sodium 06/02/2014 134* 135 - 145 mEq/L Final  . Potassium  06/02/2014 4.2  3.5 - 5.1 mEq/L Final  . Chloride 06/02/2014 98  96 - 112 mEq/L Final  . CO2 06/02/2014 28  19 - 32 mEq/L Final  . Glucose, Bld 06/02/2014 244* 70 - 99 mg/dL Final  . BUN 06/02/2014 26* 6 - 23 mg/dL Final  . Creatinine, Ser 06/02/2014 1.22* 0.40 - 1.20 mg/dL Final  . Calcium 06/02/2014 10.3  8.4 - 10.5 mg/dL Final  . GFR 06/02/2014 57.69* >60.00 mL/min Final       Medication List       This list is accurate as of: 06/16/14  1:49 PM.  Always use your most recent med list.               furosemide 40 MG tablet  Commonly known as:  LASIX  Take 1 tablet (40 mg total) by mouth daily.     glucose blood test strip  Commonly known as:  ONE TOUCH ULTRA TEST  Use as instructed to check blood sugar 3 times  a day dx code E11.65     hydrocortisone valerate cream 0.2 %  Commonly known as:  WESTCORT     Insulin Lispro Prot & Lispro (75-25) 100 UNIT/ML Kwikpen  Commonly known as:  HUMALOG MIX 75/25 KWIKPEN  Inject 14 units at breakfast and 10 units at supper     Insulin Pen Needle 32G X 4 MM Misc  Commonly known as:  BD PEN NEEDLE NANO U/F  Use 2 per day     metFORMIN 1000 MG tablet  Commonly known as:  GLUCOPHAGE  Take 1 tablet (1,000 mg total) by mouth 2 (two) times daily with a meal.        Allergies:  Allergies  Allergen Reactions  . Avapro [Irbesartan] Other (See Comments)    headaches  . Codeine Nausea And Vomiting    Past Medical History  Diagnosis Date  . Diabetes mellitus   . Asthma   . Hypertension   . Abnormal Pap smear   . LGSIL (low grade squamous intraepithelial dysplasia)     Past Surgical History  Procedure Laterality Date  . Abdominal hysterectomy    . Bladder surgery    . Colposcopy      Family History  Problem Relation Age of Onset  . Diabetes Mother   . Diabetes Maternal Grandmother   . Heart disease Neg Hx   . Hypertension Neg Hx     Social History:  reports that she has never smoked. She has never used smokeless  tobacco. She reports that she does not drink alcohol or use illicit drugs.    Review of Systems  ACUTE renal insufficiency: This has resolved and not clear of the etiology; on evaluation by nephrologist kidney function was normal and no etiology is found  Lab Results  Component Value Date   CREATININE 1.22* 06/02/2014   CREATININE 3.39* 05/09/2014   CREATININE 1.1 01/03/2014   EDEMA: She was having significant edema on her last visit and this is better with taking Lasix 40 mg per day Previously was on Maxzide for hypertension  Hypertension: She has better blood pressure reading now with taking only Lasix No hypokalemia with this  Lab Results  Component Value Date   CREATININE 1.22* 06/02/2014   BUN 26* 06/02/2014   NA 134* 06/02/2014   K 4.2 06/02/2014   CL 98 06/02/2014   CO2 28 06/02/2014         Lipids: She had been  on Lipitor, LDL has been previously high, around 180       Lab Results  Component Value Date   CHOL 181 10/28/2013   HDL 42.80 10/28/2013   LDLCALC 111* 10/28/2013   TRIG 134.0 10/28/2013   CHOLHDL 4 10/28/2013               She does have a history of mild Numbness but no tingling or burning in feet    Diabetic foot exam last done in 0000000  Complications from diabetes: Proliferative retinopathy, microalbuminuria  PRURITUS: She is not having any itching now   Physical Examination:  BP 142/72 mmHg  Pulse 98  Temp(Src) 98.6 F (37 C) (Oral)  Ht 5\' 3"  (1.6 m)  Wt 222 lb 6 oz (100.869 kg)  BMI 39.40 kg/m2  SpO2 94%   Trace ankle edema present    ASSESSMENT:  Diabetes type 2, with obesity Her blood sugars have been progressively improving with adding metformin to her premixed insulin regimen However discussed blood sugar target with the patient and indicated that she needs to continue increasing insulin gradually Meanwhile since she had excellent response to Trulicity she is still a good candidate for different GLP-1 drug which may have  less nausea  Discussed using Tanzeum 30 mg weekly and this will be better tolerated She does not know how to use the pen device and will be instructed by nurse educator Also may need to be doing prior authorization through the insurance company Again discussed blood sugar targets, need to check postprandial readings also and reminded her on the premixed nature of her insulin She is very reluctant to consider basal bolus insulin with 4 injections a day  Hypertension: Blood pressure is fairly well controlled right now with Lasix that is being used for edema which has also improved significantly  PLAN:   Increase a.m. dose to 18 and suppertime dose to 16 units   Start Tanzeum 30 mg weekly when available and patient when she is trained on this.  Check blood sugar consistently after supper  Start walking program for exercise, stressed importance of using this for improving insulin sensitivity and weight loss  Continue metformin 1 g twice a day  We'll continue to follow renal function  Counseling time on subjects discussed above is over 50% of today's 25 minute visit   Albie Arizpe 06/16/2014, 1:49 PM   Note: This office note was prepared with Estate agent. Any transcriptional errors that result from this process are unintentional.

## 2014-06-16 NOTE — Patient Instructions (Addendum)
Insulin 18 in am and 16 at supper  Check blood sugars on waking up ..  .. times a week Also check blood sugars about 2 hours after a meal and do this after different meals by rotation  Recommended blood sugar levels on waking up is 90-130 and about 2 hours after meal is 140-180 Please bring blood sugar monitor to each visit.  Start walking daily  Tanzeum weekly

## 2014-06-16 NOTE — Progress Notes (Signed)
Pre visit review using our clinic review tool, if applicable. No additional management support is needed unless otherwise documented below in the visit note. 

## 2014-06-18 DIAGNOSIS — E119 Type 2 diabetes mellitus without complications: Secondary | ICD-10-CM | POA: Diagnosis not present

## 2014-06-18 DIAGNOSIS — N179 Acute kidney failure, unspecified: Secondary | ICD-10-CM | POA: Diagnosis not present

## 2014-06-18 DIAGNOSIS — E1122 Type 2 diabetes mellitus with diabetic chronic kidney disease: Secondary | ICD-10-CM | POA: Diagnosis not present

## 2014-06-18 DIAGNOSIS — I129 Hypertensive chronic kidney disease with stage 1 through stage 4 chronic kidney disease, or unspecified chronic kidney disease: Secondary | ICD-10-CM | POA: Diagnosis not present

## 2014-06-25 ENCOUNTER — Ambulatory Visit (INDEPENDENT_AMBULATORY_CARE_PROVIDER_SITE_OTHER): Payer: Medicare Other | Admitting: Ophthalmology

## 2014-07-04 ENCOUNTER — Ambulatory Visit (INDEPENDENT_AMBULATORY_CARE_PROVIDER_SITE_OTHER): Payer: Medicare Other | Admitting: Ophthalmology

## 2014-07-11 ENCOUNTER — Ambulatory Visit (INDEPENDENT_AMBULATORY_CARE_PROVIDER_SITE_OTHER): Payer: Medicare Other | Admitting: Ophthalmology

## 2014-08-01 ENCOUNTER — Other Ambulatory Visit (INDEPENDENT_AMBULATORY_CARE_PROVIDER_SITE_OTHER): Payer: Medicare Other

## 2014-08-01 DIAGNOSIS — E1165 Type 2 diabetes mellitus with hyperglycemia: Secondary | ICD-10-CM

## 2014-08-01 DIAGNOSIS — IMO0002 Reserved for concepts with insufficient information to code with codable children: Secondary | ICD-10-CM

## 2014-08-01 LAB — COMPREHENSIVE METABOLIC PANEL
ALT: 12 U/L (ref 0–35)
AST: 13 U/L (ref 0–37)
Albumin: 3.9 g/dL (ref 3.5–5.2)
Alkaline Phosphatase: 66 U/L (ref 39–117)
BUN: 20 mg/dL (ref 6–23)
CO2: 31 mEq/L (ref 19–32)
Calcium: 10 mg/dL (ref 8.4–10.5)
Chloride: 102 mEq/L (ref 96–112)
Creatinine, Ser: 1.02 mg/dL (ref 0.40–1.20)
GFR: 70.89 mL/min (ref >60.00)
Glucose, Bld: 85 mg/dL (ref 70–99)
Potassium: 3.9 mEq/L (ref 3.5–5.1)
Sodium: 141 mEq/L (ref 135–145)
Total Bilirubin: 0.3 mg/dL (ref 0.2–1.2)
Total Protein: 8 g/dL (ref 6.0–8.3)

## 2014-08-02 LAB — FRUCTOSAMINE: Fructosamine: 255 umol/L (ref 0–285)

## 2014-08-06 ENCOUNTER — Ambulatory Visit (INDEPENDENT_AMBULATORY_CARE_PROVIDER_SITE_OTHER): Payer: Medicare Other | Admitting: Endocrinology

## 2014-08-06 ENCOUNTER — Encounter: Payer: Self-pay | Admitting: Endocrinology

## 2014-08-06 VITALS — BP 136/78 | HR 99 | Temp 98.2°F | Resp 16 | Ht 63.0 in | Wt 230.6 lb

## 2014-08-06 DIAGNOSIS — E785 Hyperlipidemia, unspecified: Secondary | ICD-10-CM | POA: Diagnosis not present

## 2014-08-06 DIAGNOSIS — IMO0002 Reserved for concepts with insufficient information to code with codable children: Secondary | ICD-10-CM

## 2014-08-06 DIAGNOSIS — E1165 Type 2 diabetes mellitus with hyperglycemia: Secondary | ICD-10-CM | POA: Diagnosis not present

## 2014-08-06 DIAGNOSIS — I10 Essential (primary) hypertension: Secondary | ICD-10-CM

## 2014-08-06 NOTE — Progress Notes (Signed)
Patient ID: Stephanie [name], female   DOB: 25-Oct-1955, 59 y.o.   MRN: [id number]           Reason for Appointment: Followup   Referring physician: Hulan Fess  History of Present Illness:          Diagnosis: Type 2 diabetes mellitus, date of diagnosis: 2003       Past history:  She was initially treated metformin at some point glimepiride also added and not clear what her level of control was in the first few years. Had been only taking 500 mg twice a day of metformin and Amaryl increased to 4 mg twice a day several years ago In the last few years her blood sugar control has been more difficult and she has been managed by an endocrinologist Because of poor control she had been tried on Janumet and [place] but she states that she could not tolerate them because of headaches  Because of inadequate control with Amaryl and metformin and also difficulty with weight gain she had started Trulicity in [id number] on her initial consultation Her weight on her initial visit was 240 pounds.  Recent history:   INSULIN DOSE: Humalog Mix 75/25, 18 before breakfast and 16 before supper   Her A1c had gone down to 7.1% in [id number] with using Trulicity which she was tolerating at that time However in 2/16 she stopped taking Trulicity as it was causing nausea Also around the same time because of problems with persistent itching she stopped her Amaryl and metformin also  Because of markedly increased sugars she has been on premixed insulin since 4/16 Her insulin dose has been titrated up gradually Also she was started back on metformin which she has not increased to 1000 mg twice a day without any side effects  Have discussed starting back on a GLP-1 drug like Tanzeum which will cause less nausea than Trulicity. For some reason she did not pick up the prescription at the drug store and go to the nurse educator for learning how to do the injection  Current history, problems and blood sugar readings:  She  now says that her sugars have been higher recently because of eating poorly and having guests in her house.  She is also forgetting to take her insulin before eating at suppertime and will not take it about twice a week or more  Glucose readings: There are mostly higher recently although there are near normal fasting about 10 days ago  Bedtime readings are mostly high recently   However her fructosamine of 255 indicates overall reasonably good control compared to previous readings  She still has not started any exercise despite reminders       Oral hypoglycemic drugs the patient is taking are: Metformin     Side effects from medications have been: Januvia, Onglyza apparently caused headaches, Invokana: Candidiasis   Compliance with the medical regimen: Fair   Hypoglycemia: none  Glucose monitoring:  done 1-2 time a day         Glucometer: One Touch Ultra.      Blood Glucose readings by download    Mean values apply above for all meters except median for One Touch  PRE-MEAL Fasting Lunch Dinner Bedtime Overall  Glucose range:  126-232   193    102-219    Mean/median:  167     175   171     Glycemic control:   Lab Results  Component Value Date   HGBA1C 11.2* 05/09/2014  HGBA1C 7.1* 01/03/2014   HGBA1C 8.6* 10/28/2013   Lab Results  Component Value Date   MICROALBUR 14.2* 05/09/2014   LDLCALC 111* 10/28/2013   CREATININE 1.02 08/01/2014    Retinal exam: Most recent:7/15.    Self-care: The diet that the patient has been following is: tries to limit fats.      Meals: 2-3 meals per day. Breakfast is cereal or scrambled eggs. Lunch is half sandwich, yogurt and fruit; dinner usually baked chicken, bread and vegetables, snacks are fruits or nuts. Not eating out regularly  Exercise: none         Dietician visit: Most recent: Several years ago.  CDE visit: 12/15              Weight history: Previous range 200-260  Wt Readings from Last 3 Encounters:  08/06/14 230 lb 9.6 oz  (104.599 kg)  06/16/14 222 lb 6 oz (100.869 kg)  06/02/14 222 lb 9.6 oz (100.971 kg)    RENAL dysfunction and edema: See review of systems for discussion   LABS:  Lab on 08/01/2014  Component Date Value Ref Range Status  . Fructosamine 08/01/2014 255  0 - 285 umol/L Final   Comment: Published reference interval for apparently healthy subjects between age 42 and 75 is 63 - 285 umol/L and in a poorly controlled diabetic population is 228 - 563 umol/L with a mean of 396 umol/L.   Marland Kitchen Sodium 08/01/2014 141  135 - 145 mEq/L Final  . Potassium 08/01/2014 3.9  3.5 - 5.1 mEq/L Final  . Chloride 08/01/2014 102  96 - 112 mEq/L Final  . CO2 08/01/2014 31  19 - 32 mEq/L Final  . Glucose, Bld 08/01/2014 85  70 - 99 mg/dL Final  . BUN 08/01/2014 20  6 - 23 mg/dL Final  . Creatinine, Ser 08/01/2014 1.02  0.40 - 1.20 mg/dL Final  . Total Bilirubin 08/01/2014 0.3  0.2 - 1.2 mg/dL Final  . Alkaline Phosphatase 08/01/2014 66  39 - 117 U/L Final  . AST 08/01/2014 13  0 - 37 U/L Final  . ALT 08/01/2014 12  0 - 35 U/L Final  . Total Protein 08/01/2014 8.0  6.0 - 8.3 g/dL Final  . Albumin 08/01/2014 3.9  3.5 - 5.2 g/dL Final  . Calcium 08/01/2014 10.0  8.4 - 10.5 mg/dL Final  . GFR 08/01/2014 70.89  >60.00 mL/min Final       Medication List       This list is accurate as of: 08/06/14  8:07 PM.  Always use your most recent med list.               Albiglutide 30 MG Pen  30 mg weekly     furosemide 40 MG tablet  Commonly known as:  LASIX  Take 1 tablet (40 mg total) by mouth daily.     glucose blood test strip  Commonly known as:  ONE TOUCH ULTRA TEST  Use as instructed to check blood sugar 3 times  a day dx code E11.65     hydrocortisone valerate cream 0.2 %  Commonly known as:  WESTCORT     Insulin Lispro Prot & Lispro (75-25) 100 UNIT/ML Kwikpen  Commonly known as:  HUMALOG MIX 75/25 KWIKPEN  Inject 14 units at breakfast and 10 units at supper     Insulin Pen Needle 32G X 4 MM  Misc  Commonly known as:  BD PEN NEEDLE NANO U/F  Use 2 per day  metFORMIN 1000 MG tablet  Commonly known as:  GLUCOPHAGE  Take 1 tablet (1,000 mg total) by mouth 2 (two) times daily with a meal.        Allergies:  Allergies  Allergen Reactions  . Avapro [Irbesartan] Other (See Comments)    headaches  . Codeine Nausea And Vomiting    Past Medical History  Diagnosis Date  . Diabetes mellitus   . Asthma   . Hypertension   . Abnormal Pap smear   . LGSIL (low grade squamous intraepithelial dysplasia)     Past Surgical History  Procedure Laterality Date  . Abdominal hysterectomy    . Bladder surgery    . Colposcopy      Family History  Problem Relation Age of Onset  . Diabetes Mother   . Diabetes Maternal Grandmother   . Heart disease Neg Hx   . Hypertension Neg Hx     Social History:  reports that she has never smoked. She has never used smokeless tobacco. She reports that she does not drink alcohol or use illicit drugs.    Review of Systems   Transient renal insufficiency: This has resolved and not clear of the etiology; on evaluation by nephrologist kidney function was normal and no etiology is found, not clear if this was a lab error  Lab Results  Component Value Date   CREATININE 1.02 08/01/2014   CREATININE 1.22* 06/02/2014   CREATININE 3.39* 05/09/2014   EDEMA: She was having significant edema on her last visit and this is better with taking Lasix 40 mg Previously was on Maxzide for hypertension  Hypertension: She has a good blood pressure reading now with taking only Lasix  No hypokalemia with this  Lab Results  Component Value Date   CREATININE 1.02 08/01/2014   BUN 20 08/01/2014   NA 141 08/01/2014   K 3.9 08/01/2014   CL 102 08/01/2014   CO2 31 08/01/2014         Lipids: She had been  on Lipitor, LDL has been previously high, around 180, this is managed by her PCP       Lab Results  Component Value Date   CHOL 181 10/28/2013    HDL 42.80 10/28/2013   LDLCALC 111* 10/28/2013   TRIG 134.0 10/28/2013   CHOLHDL 4 10/28/2013               She does have a history of mild Numbness but no tingling or burning in feet    Diabetic foot exam last done in 0000000  Complications from diabetes: Proliferative retinopathy, microalbuminuria    Physical Examination:  BP 136/78 mmHg  Pulse 99  Temp(Src) 98.2 F (36.8 C)  Resp 16  Ht 5\' 3"  (1.6 m)  Wt 230 lb 9.6 oz (104.599 kg)  BMI 40.86 kg/m2  SpO2 95%      ASSESSMENT:  Diabetes type 2, with obesity Her blood sugars have been relatively better with taking insulin and metformin but because of poor diet and lack of activity her sugars have gone up significantly in the last 10 days See history of present illness for detailed discussion of his current management, blood sugar patterns and problems identified Also she is gaining weight significantly  Discussed again with the patient that she will not get consistent control with premixed insulin and also will continue to gain weight She is also not interested in trying a basal bolus regimen She had previously had excellent results with Trulicity but this caused nausea  Hypertension: Blood pressure is fairly well controlled   Renal function: This is completely normal now  PLAN:   Start Tanzeum 30 mg weekly as shown today in the office, brochure given on the medication  Check blood sugar consistently after supper and other meals  Start walking program for exercise or do some indoor exercises  Continue metformin 1 g twice a day  Recheck lipids on the next visit  Counseling time on subjects discussed above is over 50% of today's 25 minute visit   Stephanie [name] 08/06/2014, 8:07 PM   Note: This office note was prepared with Dragon voice recognition system technology. Any transcriptional errors that result from this process are unintentional.  Addendum: Patient after the visit stated that she will not take Tanzeum  because of fear of kidney problems and will continue on insulin

## 2014-08-06 NOTE — Patient Instructions (Addendum)
Check blood sugars on waking up .Marland Kitchen2-3  .Marland Kitchen times a week Also check blood sugars about 2 hours after a meal and do this after different meals by rotation  Recommended blood sugar levels on waking up is 90-130 and about 2 hours after meal is 140-180 Please bring blood sugar monitor to each visit.  Walk or exercise daily  Tanzeum 30mg  weekly  Take insulin before or just after the meal, reduce am dose to 16    INDOOR EXERCISE IDEAS   Use the following examples for a creative indoor workout (perform each move for 2-3 minutes):   Warm up. Put on some music that makes you feel like moving, and dance around the living room.  Watch exercise shows on TV and move along with them. There are tons of free cable channels that have daily exercise shows on them for all levels - beginner through advanced.   You can easily find a number of exercise videos but use one that will suit your liking and exercise level; you can do these on your own schedule.  Walk up and down the steps.  Do dumbbell curls and presses (if you don't have weights, use full water bottles).  Do assisted squats, keeping your back on a fitness ball against the wall or using the back of the couch for support.  Shadow box: Lift and lower the left leg; jab with the right arm, then the left; then lift and lower the right leg.  Fence (you don't even need swords). Pretend you're holding a sword in each hand. Create an X pattern standing still, then moving forward and back.  Hop on your exercise bike or treadmill -- or, for something different, use a weighted hula hoop. If you don't have any of those, just go back to dancing.  Do abdominal crunches (hold a weighted ball for added resistance).  Cool down with [employer] "I Feel Good" -- or whatever tune makes you feel good

## 2014-09-10 ENCOUNTER — Ambulatory Visit (INDEPENDENT_AMBULATORY_CARE_PROVIDER_SITE_OTHER): Payer: Medicare Other | Admitting: Ophthalmology

## 2014-09-10 DIAGNOSIS — H35033 Hypertensive retinopathy, bilateral: Secondary | ICD-10-CM

## 2014-09-10 DIAGNOSIS — H2513 Age-related nuclear cataract, bilateral: Secondary | ICD-10-CM | POA: Diagnosis not present

## 2014-09-10 DIAGNOSIS — E11331 Type 2 diabetes mellitus with moderate nonproliferative diabetic retinopathy with macular edema: Secondary | ICD-10-CM | POA: Diagnosis not present

## 2014-09-10 DIAGNOSIS — H43813 Vitreous degeneration, bilateral: Secondary | ICD-10-CM | POA: Diagnosis not present

## 2014-09-10 DIAGNOSIS — E11311 Type 2 diabetes mellitus with unspecified diabetic retinopathy with macular edema: Secondary | ICD-10-CM

## 2014-09-10 DIAGNOSIS — I10 Essential (primary) hypertension: Secondary | ICD-10-CM

## 2014-09-12 ENCOUNTER — Ambulatory Visit: Payer: Medicare Other | Admitting: Dietician

## 2014-09-12 ENCOUNTER — Other Ambulatory Visit: Payer: Medicare Other

## 2014-09-17 ENCOUNTER — Ambulatory Visit: Payer: Medicare Other | Admitting: Endocrinology

## 2014-09-17 DIAGNOSIS — K644 Residual hemorrhoidal skin tags: Secondary | ICD-10-CM | POA: Diagnosis not present

## 2014-09-17 DIAGNOSIS — Z01419 Encounter for gynecological examination (general) (routine) without abnormal findings: Secondary | ICD-10-CM | POA: Diagnosis not present

## 2014-09-18 ENCOUNTER — Telehealth: Payer: Self-pay | Admitting: Endocrinology

## 2014-09-18 ENCOUNTER — Encounter: Payer: Self-pay | Admitting: *Deleted

## 2014-09-18 NOTE — Telephone Encounter (Signed)
Letter mailed

## 2014-09-18 NOTE — Telephone Encounter (Signed)
Patient no showed today's appt. Please advise on how to follow up. °A. No follow up necessary. °B. Follow up urgent. Contact patient immediately. °C. Follow up necessary. Contact patient and schedule visit in ___ days. °D. Follow up advised. Contact patient and schedule visit in ____weeks. ° °

## 2014-09-22 NOTE — Telephone Encounter (Signed)
Noted  

## 2014-09-22 NOTE — Telephone Encounter (Signed)
LM for pt to reschedule the no showed appt

## 2014-09-30 ENCOUNTER — Other Ambulatory Visit (INDEPENDENT_AMBULATORY_CARE_PROVIDER_SITE_OTHER): Payer: Medicare Other

## 2014-09-30 DIAGNOSIS — E1165 Type 2 diabetes mellitus with hyperglycemia: Secondary | ICD-10-CM | POA: Diagnosis not present

## 2014-09-30 DIAGNOSIS — E119 Type 2 diabetes mellitus without complications: Secondary | ICD-10-CM | POA: Diagnosis not present

## 2014-09-30 DIAGNOSIS — IMO0002 Reserved for concepts with insufficient information to code with codable children: Secondary | ICD-10-CM

## 2014-09-30 LAB — LIPID PANEL
Cholesterol: 250 mg/dL — ABNORMAL HIGH (ref 0–200)
HDL: 49.8 mg/dL (ref >39.00)
LDL Cholesterol: 172 mg/dL — ABNORMAL HIGH (ref 0–99)
NonHDL: 199.83
Total CHOL/HDL Ratio: 5
Triglycerides: 138 mg/dL (ref 0.0–149.0)
VLDL: 27.6 mg/dL (ref 0.0–40.0)

## 2014-09-30 LAB — COMPREHENSIVE METABOLIC PANEL
ALT: 12 U/L (ref 0–35)
AST: 13 U/L (ref 0–37)
Albumin: 4.1 g/dL (ref 3.5–5.2)
Alkaline Phosphatase: 70 U/L (ref 39–117)
BUN: 20 mg/dL (ref 6–23)
CO2: 30 mEq/L (ref 19–32)
Calcium: 10 mg/dL (ref 8.4–10.5)
Chloride: 100 mEq/L (ref 96–112)
Creatinine, Ser: 0.97 mg/dL (ref 0.40–1.20)
GFR: 75.09 mL/min (ref >60.00)
Glucose, Bld: 117 mg/dL — ABNORMAL HIGH (ref 70–99)
Potassium: 3.8 mEq/L (ref 3.5–5.1)
Sodium: 140 mEq/L (ref 135–145)
Total Bilirubin: 0.4 mg/dL (ref 0.2–1.2)
Total Protein: 8.1 g/dL (ref 6.0–8.3)

## 2014-09-30 LAB — HEMOGLOBIN A1C: Hgb A1c MFr Bld: 8.1 % — ABNORMAL HIGH (ref 4.6–6.5)

## 2014-10-01 ENCOUNTER — Ambulatory Visit: Payer: Medicare Other | Admitting: Endocrinology

## 2014-10-02 LAB — FRUCTOSAMINE: Fructosamine: 297 umol/L — ABNORMAL HIGH (ref 190–270)

## 2014-10-06 ENCOUNTER — Ambulatory Visit (INDEPENDENT_AMBULATORY_CARE_PROVIDER_SITE_OTHER): Payer: Medicare Other | Admitting: Endocrinology

## 2014-10-06 ENCOUNTER — Encounter: Payer: Self-pay | Admitting: Endocrinology

## 2014-10-06 ENCOUNTER — Other Ambulatory Visit: Payer: Self-pay | Admitting: *Deleted

## 2014-10-06 VITALS — BP 146/86 | HR 92 | Temp 98.1°F | Resp 16 | Ht 63.0 in | Wt 236.2 lb

## 2014-10-06 DIAGNOSIS — E1165 Type 2 diabetes mellitus with hyperglycemia: Secondary | ICD-10-CM | POA: Diagnosis not present

## 2014-10-06 DIAGNOSIS — IMO0002 Reserved for concepts with insufficient information to code with codable children: Secondary | ICD-10-CM

## 2014-10-06 DIAGNOSIS — R600 Localized edema: Secondary | ICD-10-CM | POA: Diagnosis not present

## 2014-10-06 DIAGNOSIS — E785 Hyperlipidemia, unspecified: Secondary | ICD-10-CM

## 2014-10-06 MED ORDER — GLUCOSE BLOOD VI STRP: ORAL_STRIP | Status: DC

## 2014-10-06 MED ORDER — ROSUVASTATIN CALCIUM 5 MG PO TABS: 5.0000 mg | ORAL_TABLET | Freq: Every day | ORAL | Status: DC

## 2014-10-06 MED ORDER — INSULIN LISPRO PROT & LISPRO (75-25 MIX) 100 UNIT/ML KWIKPEN: PEN_INJECTOR | SUBCUTANEOUS | Status: DC

## 2014-10-06 MED ORDER — INSULIN PEN NEEDLE 32G X 4 MM MISC: Status: DC

## 2014-10-06 NOTE — Progress Notes (Signed)
Patient ID: Stephanie [name], female   DOB: 02/20/1955, 59 y.o.   MRN: [id number]           Reason for Appointment: Followup   Referring physician: Hulan Fess  History of Present Illness:          Diagnosis: Type 2 diabetes mellitus, date of diagnosis: 2003       Past history:  She was initially treated metformin at some point glimepiride also added and not clear what her level of control was in the first few years. Had been only taking 500 mg twice a day of metformin and Amaryl increased to 4 mg twice a day several years ago In the last few years her blood sugar control has been more difficult and she has been managed by an endocrinologist Because of poor control she had been tried on Janumet and [place] but she states that she could not tolerate them because of headaches Had poor control with Amaryl and metformin and also difficulty with weight gain she had started Trulicity in [id number] on her initial consultation Her weight on her initial visit was 240 pounds. Her A1c had gone down to 7.1% in [id number] with using Trulicity which she was tolerating at that time However in 2/16 she stopped taking Trulicity as it was causing nausea  Recent history:   INSULIN DOSE: Humalog Mix 75/25, 18 before breakfast and 16 before supper   Because of markedly increased sugars she has been on premixed insulin since 4/16 Her insulin dose has been titrated up gradually Also she was started back on metformin which she is taking at a dose of 1000 mg twice a day without any side effects  Have discussed starting back on a GLP-1 drug like Tanzeum which will cause less nausea than Trulicity but she refused to start this because of fear of side effects. Her blood sugars are still poorly controlled and her A1c is still high at 8.1  Current history, problems and blood sugar readings:  She has persistently high blood sugars throughout the day, generally higher at bedtime  Checking the blood sugar mostly before  breakfast and right at bedtime and not doing any postprandial readings  Although she had one reading of 144 in the morning, blood sugars fasting recently are consistently over 200  She has only a couple of readings in the early evening and not clear if she is having high readings after breakfast or lunch  She does not know why her sugars have been as high as 397 at night, she does not think she has forgotten her insulin at suppertime  Not consistent with diet and weight is going up again  She still has not started any exercise despite reminders and suggestions on how to do indoor exercise       Oral hypoglycemic drugs the patient is taking are: Metformin     Side effects from medications have been: Januvia, Onglyza apparently caused headaches, Invokana: Candidiasis   Compliance with the medical regimen: Fair   Hypoglycemia: none  Glucose monitoring:  done 1-2 time a day         Glucometer: One Touch Ultra.      Blood Glucose readings by download   Mean values apply above for all meters except median for One Touch  PRE-MEAL Fasting Lunch Dinner Bedtime Overall  Glucose range:  144-252    123  184-397  123-397   Mean/median: 180     184     Glycemic control:  Lab Results  Component Value Date   HGBA1C 8.1* 09/30/2014   HGBA1C 11.2* 05/09/2014   HGBA1C 7.1* 01/03/2014   Lab Results  Component Value Date   MICROALBUR 14.2* 05/09/2014   LDLCALC 172* 09/30/2014   CREATININE 0.97 09/30/2014    Retinal exam: Most recent:7/15.    Self-care: The diet that the patient has been following is: tries to limit fats.      Meals: 2-3 meals per day. Breakfast is cereal or scrambled eggs. Lunch is half sandwich, yogurt and fruit; dinner usually baked chicken, bread and vegetables, snacks are fruits or nuts. Not eating out regularly Dinner 9-10 pm  Exercise: none          Dietician visit: Most recent: Several years ago.  CDE visit: 12/15              Weight history: Previous range  200-260  Wt Readings from Last 3 Encounters:  10/06/14 236 lb 3.2 oz (107.14 kg)  08/06/14 230 lb 9.6 oz (104.599 kg)  06/16/14 222 lb 6 oz (100.869 kg)    RENAL dysfunction and edema: See review of systems for discussion   LABS:  Appointment on 09/30/2014  Component Date Value Ref Range Status  . Fructosamine 09/30/2014 297* 190 - 270 umol/L Final  . Hgb A1c MFr Bld 09/30/2014 8.1* 4.6 - 6.5 % Final   Glycemic Control Guidelines for People with Diabetes:Non Diabetic:  <6%Goal of Therapy: <7%Additional Action Suggested:  >8%   . Cholesterol 09/30/2014 250* 0 - 200 mg/dL Final   ATP III Classification       Desirable:  < 200 mg/dL               Borderline High:  200 - 239 mg/dL          High:  > = 240 mg/dL  . Triglycerides 09/30/2014 138.0  0.0 - 149.0 mg/dL Final   Normal:  <150 mg/dLBorderline High:  150 - 199 mg/dL  . HDL 09/30/2014 49.80  >39.00 mg/dL Final  . VLDL 09/30/2014 27.6  0.0 - 40.0 mg/dL Final  . LDL Cholesterol 09/30/2014 172* 0 - 99 mg/dL Final  . Total CHOL/HDL Ratio 09/30/2014 5   Final                  Men          Women1/2 Average Risk     3.4          3.3Average Risk          5.0          4.42X Average Risk          9.6          7.13X Average Risk          15.0          11.0                      . NonHDL 09/30/2014 199.83   Final   NOTE:  Non-HDL goal should be 30 mg/dL higher than patient's LDL goal (i.e. LDL goal of < 70 mg/dL, would have non-HDL goal of < 100 mg/dL)  . Sodium 09/30/2014 140  135 - 145 mEq/L Final  . Potassium 09/30/2014 3.8  3.5 - 5.1 mEq/L Final  . Chloride 09/30/2014 100  96 - 112 mEq/L Final  . CO2 09/30/2014 30  19 - 32 mEq/L Final  . Glucose, Bld 09/30/2014 117* 70 - 99  mg/dL Final  . BUN 09/30/2014 20  6 - 23 mg/dL Final  . Creatinine, Ser 09/30/2014 0.97  0.40 - 1.20 mg/dL Final  . Total Bilirubin 09/30/2014 0.4  0.2 - 1.2 mg/dL Final  . Alkaline Phosphatase 09/30/2014 70  39 - 117 U/L Final  . AST 09/30/2014 13  0 - 37 U/L Final    . ALT 09/30/2014 12  0 - 35 U/L Final  . Total Protein 09/30/2014 8.1  6.0 - 8.3 g/dL Final  . Albumin 09/30/2014 4.1  3.5 - 5.2 g/dL Final  . Calcium 09/30/2014 10.0  8.4 - 10.5 mg/dL Final  . GFR 09/30/2014 75.09  >60.00 mL/min Final       Medication List       This list is accurate as of: 10/06/14 12:11 PM.  Always use your most recent med list.               Albiglutide 30 MG Pen  30 mg weekly     furosemide 40 MG tablet  Commonly known as:  LASIX  Take 1 tablet (40 mg total) by mouth daily.     glucose blood test strip  Commonly known as:  ONE TOUCH ULTRA TEST  Use as instructed to check blood sugar 3 times  a day dx code E11.65     hydrocortisone valerate cream 0.2 %  Commonly known as:  WESTCORT     Insulin Lispro Prot & Lispro (75-25) 100 UNIT/ML Kwikpen  Commonly known as:  HUMALOG MIX 75/25 KWIKPEN  Inject 18 units at breakfast and 16 units at supper     Insulin Pen Needle 32G X 4 MM Misc  Commonly known as:  BD PEN NEEDLE NANO U/F  Use 2 per day     metFORMIN 1000 MG tablet  Commonly known as:  GLUCOPHAGE  Take 1 tablet (1,000 mg total) by mouth 2 (two) times daily with a meal.     rosuvastatin 5 MG tablet  Commonly known as:  CRESTOR  Take 1 tablet (5 mg total) by mouth daily.        Allergies:  Allergies  Allergen Reactions  . Avapro [Irbesartan] Other (See Comments)    headaches  . Codeine Nausea And Vomiting    Past Medical History  Diagnosis Date  . Diabetes mellitus   . Asthma   . Hypertension   . Abnormal Pap smear   . LGSIL (low grade squamous intraepithelial dysplasia)     Past Surgical History  Procedure Laterality Date  . Abdominal hysterectomy    . Bladder surgery    . Colposcopy      Family History  Problem Relation Age of Onset  . Diabetes Mother   . Diabetes Maternal Grandmother   . Heart disease Neg Hx   . Hypertension Neg Hx     Social History:  reports that she has never smoked. She has never used  smokeless tobacco. She reports that she does not drink alcohol or use illicit drugs.    Review of Systems   Transient renal insufficiency: This has resolved and not clear of the etiology; on evaluation by nephrologist kidney function was normal and no etiology is found, not clear if this was a lab error  Lab Results  Component Value Date   CREATININE 0.97 09/30/2014   CREATININE 1.02 08/01/2014   CREATININE 1.22* 06/02/2014   EDEMA: She was having significant edema and this is somewhat better with taking Lasix 40 mg Previously was on Maxzide  for hypertension, blood pressure is relatively high today  Hypertension: She has home meter with readings of 130/ 78  No hypokalemia    Lab Results  Component Value Date   CREATININE 0.97 09/30/2014   BUN 20 09/30/2014   NA 140 09/30/2014   K 3.8 09/30/2014   CL 100 09/30/2014   CO2 30 09/30/2014        Lipids: She had been on Lipitor, LDL has been previously high, around 180, this is managed by her PCP  She does not know why she was taken off Lipitor and she is not going back to see her PCP anytime soon       Lab Results  Component Value Date   CHOL 250* 09/30/2014   HDL 49.80 09/30/2014   LDLCALC 172* 09/30/2014   TRIG 138.0 09/30/2014   CHOLHDL 5 09/30/2014               She does have a history of mild Numbness but no tingling or burning in feet    Diabetic foot exam last done in 0000000  Complications from diabetes: Proliferative retinopathy, microalbuminuria   Physical Examination:  BP 146/86 mmHg  Pulse 92  Temp(Src) 98.1 F (36.7 C)  Resp 16  Ht 5\' 3"  (1.6 m)  Wt 236 lb 3.2 oz (107.14 kg)  BMI 41.85 kg/m2  SpO2 96%     2+ ankle edema present.  Blood pressure was 92 diastolic on first measurement  ASSESSMENT:  Diabetes type 2, with obesity Her blood sugars have been poorly controlled and her A1c is over 8% She is continuing to gain weight with insulin alone Also blood sugars are mostly high at bedtime and  fasting She is not motivated to exercise and does not consistently watch her diet She is also not interested in trying a basal bolus regimen She had previously had excellent results with Trulicity but this caused nausea  She was suggested taking Tanzeum and she keeps refusing this but today is finally agreeing after explained to her how this works and that it could be effective in controlling her sugar in combination with insulin and also maintain a lower weight   Hypertension: Blood pressure is relatively high She has significant EDEMA  Renal function: This is consistently normal again  PLAN:   Start Tanzeum 30 mg weekly as shown today in the office by nurse educator  Check blood sugar consistently after  meals and blood sugar targets were discussed  Start walking or an indoor exercise program for exercise   Continue metformin 1 g twice a day  Start Crestor 5 mg daily for hyperlipidemia  Recheck lipids on the next visit  Increase Lasix to at least 60 mg daily  Counseling time on subjects discussed above is over 50% of today's 25 minute visit   Stephanie [name] 10/06/2014, 12:11 PM   Note: This office note was prepared with Estate agent. Any transcriptional errors that result from this process are unintentional.  Addendum: Patient after the visit stated that she will not take Tanzeum because of fear of kidney problems and will continue on insulin

## 2014-10-06 NOTE — Patient Instructions (Addendum)
Check blood sugars on waking up .Marland Kitchen3-4  .Marland Kitchen times a week Also check blood sugars about 2 hours after a meal and do this after different meals by rotation  Recommended blood sugar levels on waking up is 90-130 and about 2 hours after meal is 140-180 Please bring blood sugar monitor to each visit.  Take Tanzeum weekly  Exercise 40 min daily  Insulin to 16 in am and go to 18-20 at dinner  Lasix 1 1/2 daily

## 2014-10-07 ENCOUNTER — Other Ambulatory Visit: Payer: Self-pay | Admitting: Endocrinology

## 2014-10-08 ENCOUNTER — Other Ambulatory Visit: Payer: Self-pay | Admitting: *Deleted

## 2014-10-08 ENCOUNTER — Telehealth: Payer: Self-pay | Admitting: Endocrinology

## 2014-10-08 MED ORDER — SIMVASTATIN 40 MG PO TABS: 40.0000 mg | ORAL_TABLET | Freq: Every day | ORAL | Status: DC

## 2014-10-08 NOTE — Telephone Encounter (Signed)
Noted, rx sent, message left for patient

## 2014-10-08 NOTE — Telephone Encounter (Signed)
Change to simvastatin 40 mg daily

## 2014-10-08 NOTE — Telephone Encounter (Signed)
Please see below and advise.

## 2014-10-08 NOTE — Telephone Encounter (Signed)
The med rosuvastatin cal 5 mg causes breaking out in hives and itching

## 2014-11-05 ENCOUNTER — Encounter: Payer: Self-pay | Admitting: *Deleted

## 2014-11-05 ENCOUNTER — Ambulatory Visit: Payer: Medicare Other | Admitting: Endocrinology

## 2014-11-26 ENCOUNTER — Other Ambulatory Visit (INDEPENDENT_AMBULATORY_CARE_PROVIDER_SITE_OTHER): Payer: Medicare Other

## 2014-11-26 ENCOUNTER — Other Ambulatory Visit: Payer: Self-pay | Admitting: *Deleted

## 2014-11-26 DIAGNOSIS — E1121 Type 2 diabetes mellitus with diabetic nephropathy: Secondary | ICD-10-CM

## 2014-11-26 LAB — COMPREHENSIVE METABOLIC PANEL
ALT: 14 U/L (ref 0–35)
AST: 14 U/L (ref 0–37)
Albumin: 3.9 g/dL (ref 3.5–5.2)
Alkaline Phosphatase: 71 U/L (ref 39–117)
BUN: 16 mg/dL (ref 6–23)
CO2: 29 mEq/L (ref 19–32)
Calcium: 9.5 mg/dL (ref 8.4–10.5)
Chloride: 100 mEq/L (ref 96–112)
Creatinine, Ser: 0.87 mg/dL (ref 0.40–1.20)
GFR: 85.09 mL/min (ref >60.00)
Glucose, Bld: 191 mg/dL — ABNORMAL HIGH (ref 70–99)
Potassium: 3.9 mEq/L (ref 3.5–5.1)
Sodium: 137 mEq/L (ref 135–145)
Total Bilirubin: 0.4 mg/dL (ref 0.2–1.2)
Total Protein: 8.3 g/dL (ref 6.0–8.3)

## 2014-12-03 ENCOUNTER — Encounter: Payer: Self-pay | Admitting: Endocrinology

## 2014-12-03 ENCOUNTER — Ambulatory Visit (INDEPENDENT_AMBULATORY_CARE_PROVIDER_SITE_OTHER): Payer: Medicare Other | Admitting: Endocrinology

## 2014-12-03 VITALS — BP 156/86 | HR 89 | Temp 97.9°F | Resp 16 | Ht 63.0 in | Wt 233.6 lb

## 2014-12-03 DIAGNOSIS — Z23 Encounter for immunization: Secondary | ICD-10-CM | POA: Diagnosis not present

## 2014-12-03 DIAGNOSIS — I10 Essential (primary) hypertension: Secondary | ICD-10-CM | POA: Diagnosis not present

## 2014-12-03 DIAGNOSIS — E1165 Type 2 diabetes mellitus with hyperglycemia: Secondary | ICD-10-CM | POA: Diagnosis not present

## 2014-12-03 DIAGNOSIS — Z794 Long term (current) use of insulin: Secondary | ICD-10-CM

## 2014-12-03 MED ORDER — EMPAGLIFLOZIN 10 MG PO TABS: 10.0000 mg | ORAL_TABLET | Freq: Every day | ORAL | Status: DC

## 2014-12-03 NOTE — Patient Instructions (Addendum)
Check blood sugars on waking up 3-4  times a week Also check blood sugars about 2 hours after a meal and do this after different meals by rotation  Recommended blood sugar levels on waking up is 90-130 and about 2 hours after meal is 130-160  Please bring your blood sugar monitor to each visit, thank you  Jardiance before Bfst  Insulin 20 in am and 25 before supper, take insulin with you

## 2014-12-03 NOTE — Progress Notes (Signed)
Patient ID: Stephanie [name], female   DOB: [birth year], 59 y.o.   MRN: [id number]           Reason for Appointment: Followup of diabetes  Referring physician: Hulan Fess  History of Present Illness:          Diagnosis: Type 2 diabetes mellitus, date of diagnosis: 2003       Past history:  She was initially treated metformin at some point glimepiride also added and not clear what her level of control was in the first few years. Had been only taking 500 mg twice a day of metformin and Amaryl increased to 4 mg twice a day several years ago In the last few years her blood sugar control has been more difficult and she has been managed by an endocrinologist Because of poor control she had been tried on Janumet and [place] but she states that she could not tolerate them because of headaches Had poor control with Amaryl and metformin and also difficulty with weight gain she had started Trulicity in [id number] on her initial consultation Her weight on her initial visit was 240 pounds. Her A1c had gone down to 7.1% in [id number] with using Trulicity which she was tolerating at that time However in 2/16 she stopped taking Trulicity as it was causing nausea  Recent history:   INSULIN DOSE: Humalog Mix 75/25, 18 before breakfast and 16 before supper   Because of markedly increased sugars she has been on premixed insulin since 4/16 Also she is  on metformin which she is taking at a dose of 1000 mg twice a day without any side effects  Have discussed starting back on a GLP-1 drug like Tanzeum which will cause less nausea than Trulicity but she again refused to start this after her last visit because of fear of side effects. Her blood sugars are still poorly controlled and her blood sugars are averaging 260 at home now Last A1c is still high at 8.1 and fructosamine is high  Current history, problems and blood sugar readings:  She has persistently high blood sugars in the mornings and none below  200  She is checking blood sugars sporadically later in the day and they appear to be relatively better midday and afternoon  Blood sugars late in the evenings are again over 200 and occasionally over 300  About 3 times a week she will forget to take her evening insulin as she did not take it with her when going up  She has not had any formal meal planning and education  She does not appear concerned about her hyperglycemia and did not report that her sugars are going higher  She still has not started any exercise despite reminders and suggestions on how to do indoor exercise       Oral hypoglycemic drugs the patient is taking are: Metformin     Side effects from medications have been: Januvia, Onglyza apparently caused headaches, Invokana: Candidiasis   Compliance with the medical regimen: Fair   Hypoglycemia: none  Glucose monitoring:  done 1-2 time a day         Glucometer: One Touch Ultra.      Blood Glucose readings by download   Mean values apply above for all meters except median for One Touch  PRE-MEAL Fasting Lunch Dinner Bedtime Overall  Glucose range:  207-336   165, 169   138-, 260   250-377    Mean/median:  265      261  Glycemic control:   Lab Results  Component Value Date   HGBA1C 8.1* 09/30/2014   HGBA1C 11.2* 05/09/2014   HGBA1C 7.1* 01/03/2014   Lab Results  Component Value Date   MICROALBUR 14.2* 05/09/2014   LDLCALC 172* 09/30/2014   CREATININE 0.87 11/26/2014    Retinal exam: Most recent:7/15.    Self-care: The diet that the patient has been following is: tries to limit fats.      Meals: 2-3 meals per day. Breakfast is cereal or scrambled eggs. Lunch is half sandwich, yogurt and fruit; dinner usually baked chicken, bread and vegetables, snacks are fruits or nuts. Not eating out regularly Dinner 9-10 pm  Exercise: none          Dietician visit: Most recent: Several years ago.  CDE visit: 12/15              Weight history: Previous range  200-260  Wt Readings from Last 3 Encounters:  12/03/14 233 lb 9.6 oz (105.96 kg)  10/06/14 236 lb 3.2 oz (107.14 kg)  08/06/14 230 lb 9.6 oz (104.599 kg)    RENAL dysfunction and edema: See review of systems for discussion   LABS:  No visits with results within 1 Week(s) from this visit. Latest known visit with results is:  Appointment on 11/26/2014  Component Date Value Ref Range Status  . Sodium 11/26/2014 137  135 - 145 mEq/L Final  . Potassium 11/26/2014 3.9  3.5 - 5.1 mEq/L Final  . Chloride 11/26/2014 100  96 - 112 mEq/L Final  . CO2 11/26/2014 29  19 - 32 mEq/L Final  . Glucose, Bld 11/26/2014 191* 70 - 99 mg/dL Final  . BUN 11/26/2014 16  6 - 23 mg/dL Final  . Creatinine, Ser 11/26/2014 0.87  0.40 - 1.20 mg/dL Final  . Total Bilirubin 11/26/2014 0.4  0.2 - 1.2 mg/dL Final  . Alkaline Phosphatase 11/26/2014 71  39 - 117 U/L Final  . AST 11/26/2014 14  0 - 37 U/L Final  . ALT 11/26/2014 14  0 - 35 U/L Final  . Total Protein 11/26/2014 8.3  6.0 - 8.3 g/dL Final  . Albumin 11/26/2014 3.9  3.5 - 5.2 g/dL Final  . Calcium 11/26/2014 9.5  8.4 - 10.5 mg/dL Final  . GFR 11/26/2014 85.09  >60.00 mL/min Final       Medication List       This list is accurate as of: 12/03/14  3:14 PM.  Always use your most recent med list.               furosemide 40 MG tablet  Commonly known as:  LASIX  TAKE ONE TABLET BY MOUTH ONCE DAILY     glucose blood test strip  Commonly known as:  ONE TOUCH ULTRA TEST  Use as instructed to check blood sugar 3 times  a day dx code E11.65     hydrocortisone valerate cream 0.2 %  Commonly known as:  WESTCORT     Insulin Lispro Prot & Lispro (75-25) 100 UNIT/ML Kwikpen  Commonly known as:  HUMALOG MIX 75/25 KWIKPEN  Inject 18 units at breakfast and 16 units at supper     Insulin Pen Needle 32G X 4 MM Misc  Commonly known as:  BD PEN NEEDLE NANO U/F  Use 2 per day     metFORMIN 1000 MG tablet  Commonly known as:  GLUCOPHAGE  Take 1  tablet (1,000 mg total) by mouth 2 (two) times daily with a  meal.     rosuvastatin 5 MG tablet  Commonly known as:  CRESTOR  Take 1 tablet (5 mg total) by mouth daily.     simvastatin 40 MG tablet  Commonly known as:  ZOCOR  Take 1 tablet (40 mg total) by mouth at bedtime.        Allergies:  Allergies  Allergen Reactions  . Avapro [Irbesartan] Other (See Comments)    headaches  . Codeine Nausea And Vomiting    Past Medical History  Diagnosis Date  . Diabetes mellitus   . Asthma   . Hypertension   . Abnormal Pap smear   . LGSIL (low grade squamous intraepithelial dysplasia)     Past Surgical History  Procedure Laterality Date  . Abdominal hysterectomy    . Bladder surgery    . Colposcopy      Family History  Problem Relation Age of Onset  . Diabetes Mother   . Diabetes Maternal Grandmother   . Heart disease Neg Hx   . Hypertension Neg Hx     Social History:  reports that she has never smoked. She has never used smokeless tobacco. She reports that she does not drink alcohol or use illicit drugs.    Review of Systems   Transient renal insufficiency: This has resolved and not clear of the etiology; on evaluation by nephrologist kidney function was normal and no etiology is found, not clear if this was a lab error  Lab Results  Component Value Date   CREATININE 0.87 11/26/2014   CREATININE 0.97 09/30/2014   CREATININE 1.02 08/01/2014   EDEMA: She was having significant edema and this is somewhat better with taking Lasix 40 mg Previously was on Maxzide for hypertension, blood pressure is again high today She has not gone back to her PCP as instructed  Hypertension: She has home meter with readings of 130/ 78  No hypokalemia    Lab Results  Component Value Date   CREATININE 0.87 11/26/2014   BUN 16 11/26/2014   NA 137 11/26/2014   K 3.9 11/26/2014   CL 100 11/26/2014   CO2 29 11/26/2014        Lipids: She had been on Lipitor, LDL has been previously  high, around 180, this is managed by her PCP  She was recommended starting Crestor but she still does not want to take any medications       Lab Results  Component Value Date   CHOL 250* 09/30/2014   HDL 49.80 09/30/2014   LDLCALC 172* 09/30/2014   TRIG 138.0 09/30/2014   CHOLHDL 5 09/30/2014               She does have a history of mild Numbness but no tingling or burning in feet    Diabetic foot exam last done in 0000000  Complications from diabetes: Proliferative retinopathy, microalbuminuria   Physical Examination:  BP 156/86 mmHg  Pulse 89  Temp(Src) 97.9 F (36.6 C)  Resp 16  Ht 5\' 3"  (1.6 m)  Wt 233 lb 9.6 oz (105.96 kg)  BMI 41.39 kg/m2  SpO2 94%     1-2+ ankle edema present.   ASSESSMENT:  Diabetes type 2, with obesity See history of present illness for detailed discussion of his current management, blood sugar patterns and problems identified  Her blood sugars have been poorly controlled and home blood sugars are averaging 260 and A1c is significantly high is also fructosamine Currently on low-dose insulin along with metformin She has refused  additional treatment such as Tanzeum Although previously she had refused to continue taking Invokana because of an episode of candidiasis discussed that she does not have many options for treatment which would improve her diabetes control along with providing weight loss Discussed that she needs to try this again and discussed benefits of using Invokana Since her insurance is preferring Vania Rea will start this within milligram dose  She is not motivated to exercise and does not consistently watch her diet She is also not interested in trying a basal bolus regimen   Hypertension: Blood pressure is relatively high and may improve with starting Jardiance  EDEMA, still not resolved, may improve with starting Jardiance  Renal function: This is consistently normal again  PLAN:  Start Jardiance as above, patient  information booklet given Increase evening insulin Discussed timing and targets of blood sugar monitoring Regular exercise We'll need to discuss starting Crestor again on her next visit Consultation with dietitian  Counseling time on subjects discussed above is over 50% of today's 25 minute visit  Patient Instructions  Check blood sugars on waking up 3-4  times a week Also check blood sugars about 2 hours after a meal and do this after different meals by rotation  Recommended blood sugar levels on waking up is 90-130 and about 2 hours after meal is 130-160  Please bring your blood sugar monitor to each visit, thank you  Jardiance before Bfst  Insulin 20 in am and 25 before supper, take insulin with you      [facility] 12/03/2014, 3:14 PM   Note: This office note was prepared with Dragon voice recognition system technology. Any transcriptional errors that result from this process are unintentional.

## 2014-12-26 ENCOUNTER — Other Ambulatory Visit (INDEPENDENT_AMBULATORY_CARE_PROVIDER_SITE_OTHER): Payer: Medicare Other

## 2014-12-26 DIAGNOSIS — E1165 Type 2 diabetes mellitus with hyperglycemia: Secondary | ICD-10-CM

## 2014-12-26 DIAGNOSIS — Z794 Long term (current) use of insulin: Secondary | ICD-10-CM | POA: Diagnosis not present

## 2014-12-26 LAB — HEMOGLOBIN A1C: Hgb A1c MFr Bld: 8.1 % — ABNORMAL HIGH (ref 4.6–6.5)

## 2014-12-26 LAB — BASIC METABOLIC PANEL
BUN: 23 mg/dL (ref 6–23)
CO2: 31 mEq/L (ref 19–32)
Calcium: 9.8 mg/dL (ref 8.4–10.5)
Chloride: 101 mEq/L (ref 96–112)
Creatinine, Ser: 0.85 mg/dL (ref 0.40–1.20)
GFR: 87.38 mL/min (ref >60.00)
Glucose, Bld: 92 mg/dL (ref 70–99)
Potassium: 3.5 mEq/L (ref 3.5–5.1)
Sodium: 141 mEq/L (ref 135–145)

## 2014-12-31 ENCOUNTER — Ambulatory Visit (INDEPENDENT_AMBULATORY_CARE_PROVIDER_SITE_OTHER): Payer: Medicare Other | Admitting: Endocrinology

## 2014-12-31 ENCOUNTER — Other Ambulatory Visit: Payer: Self-pay | Admitting: Endocrinology

## 2014-12-31 ENCOUNTER — Encounter: Payer: Self-pay | Admitting: Endocrinology

## 2014-12-31 VITALS — BP 142/88 | HR 89 | Temp 98.3°F | Resp 14 | Ht 63.0 in | Wt 237.4 lb

## 2014-12-31 DIAGNOSIS — I10 Essential (primary) hypertension: Secondary | ICD-10-CM | POA: Diagnosis not present

## 2014-12-31 DIAGNOSIS — E1165 Type 2 diabetes mellitus with hyperglycemia: Secondary | ICD-10-CM

## 2014-12-31 DIAGNOSIS — Z794 Long term (current) use of insulin: Secondary | ICD-10-CM | POA: Diagnosis not present

## 2014-12-31 MED ORDER — ROSUVASTATIN CALCIUM 5 MG PO TABS: 5.0000 mg | ORAL_TABLET | Freq: Every day | ORAL | Status: DC

## 2014-12-31 MED ORDER — EMPAGLIFLOZIN 10 MG PO TABS: 10.0000 mg | ORAL_TABLET | Freq: Every day | ORAL | Status: DC

## 2014-12-31 NOTE — Patient Instructions (Signed)
Need to pick up the following new prescriptions:  1. JARDIANCE 10 mg daily to be taken before breakfast for diabetes 2. Rosuvastatin/Crestor 5 mg daily for cholesterol 3. Triamterene HCTZ 25 mg, half tablet daily for blood pressure and fluid  INSULIN: Increase dose suppertime dose to 30 units for now, may need to reduce it back to 25 if bedtime or morning sugar is below 100  Check blood sugars on waking up 3-4 times a week Also check blood sugars about 2 hours after a meal and do this after different meals by rotation  Recommended blood sugar levels on waking up is 90-130 and about 2 hours after meal is 130-160  Please bring your blood sugar monitor to each visit, thank you  Walk daily for exercise

## 2014-12-31 NOTE — Progress Notes (Signed)
Patient ID: Stephanie [name], female   DOB: Nov 19, 1955, 59 y.o.   MRN: [id number]           Reason for Appointment: Followup of diabetes  Referring physician: Hulan Fess  History of Present Illness:          Diagnosis: Type 2 diabetes mellitus, date of diagnosis: 2003       Past history:  She was initially treated metformin at some point glimepiride also added and not clear what her level of control was in the first few years. Had been only taking 500 mg twice a day of metformin and Amaryl increased to 4 mg twice a day several years ago In the last few years her blood sugar control has been more difficult and she has been managed by an endocrinologist Because of poor control she had been tried on Janumet and [place] but she states that she could not tolerate them because of headaches Had poor control with Amaryl and metformin and also difficulty with weight gain she had started Trulicity in [id number] on her initial consultation Her weight on her initial visit was 240 pounds. Her A1c had gone down to 7.1% in [id number] with using Trulicity which she was tolerating at that time However in 2/16 she stopped taking Trulicity as it was causing nausea  Recent history:   INSULIN DOSE: Humalog Mix 75/25, 20 before breakfast and 25 before supper   Because of markedly increased sugars she has been on premixed insulin since 4/16 Also she is on metformin which she is taking at a dose of 1000 mg twice a day without any side effects Have discussed starting back on a GLP-1 drug like Tanzeum which will cause less nausea than Trulicity but she again refused this  She was supposed to start Jardiance a month ago and the prescription was sent but she somehow did not get this  Also her  Insulin dose was increased on her last visit    Her blood sugars  Do not appear to be significantly better yet Last A1c is still high at 8.1     Current history, problems and blood sugar readings:  She has persistently high  blood sugars in the mornings and only occasionally below 200   blood sugars are variable in the evenings but on an average somewhat higher   She is still not able to lose weight   Again checking blood sugars mostly in the morning  She still has not started any exercise despite reminders and suggestions on how to do indoor exercise       Oral hypoglycemic drugs the patient is taking are: Metformin     Side effects from medications have been: Januvia, Onglyza apparently caused headaches, Invokana: Candidiasis   Compliance with the medical regimen: Fair   Hypoglycemia: none  Glucose monitoring:  done 1-2 time a day         Glucometer: One Touch Ultra.      Blood Glucose readings by download    Mean values apply above for all meters except median for One Touch  PRE-MEAL Fasting Lunch Dinner Bedtime Overall  Glucose range:  134- 268    180- 338   Mean/median:  210    245 218     Glycemic control:   Lab Results  Component Value Date   HGBA1C 8.1* 12/26/2014   HGBA1C 8.1* 09/30/2014   HGBA1C 11.2* 05/09/2014   Lab Results  Component Value Date   MICROALBUR 14.2* 05/09/2014   [place]  172* 09/30/2014   CREATININE 0.85 12/26/2014    Self-care: The diet that the patient has been following is: tries to limit fats.      Meals: 2-3 meals per day. Breakfast is cereal or scrambled eggs. Lunch is half sandwich, yogurt and fruit; dinner usually baked chicken, bread and vegetables, snacks are fruits or nuts. Not eating out regularly Dinner 9-10 pm  Exercise: none          Dietician visit: Most recent: Several years ago.  CDE visit: 12/15              Weight history: Previous range 200-260  Wt Readings from Last 3 Encounters:  12/31/14 237 lb 6.4 oz (107.684 kg)  12/03/14 233 lb 9.6 oz (105.96 kg)  10/06/14 236 lb 3.2 oz (107.14 kg)     EDEMA: discussed in review of systems   LABS:  Lab on 12/26/2014  Component Date Value Ref Range Status  . Sodium 12/26/2014 141  135 - 145  mEq/L Final  . Potassium 12/26/2014 3.5  3.5 - 5.1 mEq/L Final  . Chloride 12/26/2014 101  96 - 112 mEq/L Final  . CO2 12/26/2014 31  19 - 32 mEq/L Final  . Glucose, Bld 12/26/2014 92  70 - 99 mg/dL Final  . BUN 12/26/2014 23  6 - 23 mg/dL Final  . Creatinine, Ser 12/26/2014 0.85  0.40 - 1.20 mg/dL Final  . Calcium 12/26/2014 9.8  8.4 - 10.5 mg/dL Final  . GFR 12/26/2014 87.38  >60.00 mL/min Final  . Hgb A1c MFr Bld 12/26/2014 8.1* 4.6 - 6.5 % Final   Glycemic Control Guidelines for People with Diabetes:Non Diabetic:  <6%Goal of Therapy: <7%Additional Action Suggested:  >8%        Medication List       This list is accurate as of: 12/31/14  3:15 PM.  Always use your most recent med list.               empagliflozin 10 MG Tabs tablet  Commonly known as:  JARDIANCE  Take 10 mg by mouth daily.     glucose blood test strip  Commonly known as:  ONE TOUCH ULTRA TEST  Use as instructed to check blood sugar 3 times  a day dx code E11.65     hydrocortisone valerate cream 0.2 %  Commonly known as:  WESTCORT     Insulin Lispro Prot & Lispro (75-25) 100 UNIT/ML Kwikpen  Commonly known as:  HUMALOG MIX 75/25 KWIKPEN  Inject 18 units at breakfast and 16 units at supper     Insulin Pen Needle 32G X 4 MM Misc  Commonly known as:  BD PEN NEEDLE NANO U/F  Use 2 per day     metFORMIN 1000 MG tablet  Commonly known as:  GLUCOPHAGE  Take 1 tablet (1,000 mg total) by mouth 2 (two) times daily with a meal.     rosuvastatin 5 MG tablet  Commonly known as:  CRESTOR  Take 1 tablet (5 mg total) by mouth daily.     triamterene-hydrochlorothiazide 37.5-25 MG tablet  Commonly known as:  MAXZIDE-25  TAKE ONE TABLET BY MOUTH ONCE DAILY        Allergies:  Allergies  Allergen Reactions  . Avapro [Irbesartan] Other (See Comments)    headaches  . Codeine Nausea And Vomiting    Past Medical History  Diagnosis Date  . Diabetes mellitus   . Asthma   . Hypertension   . Abnormal Pap  smear   . LGSIL (low grade squamous intraepithelial dysplasia)     Past Surgical History  Procedure Laterality Date  . Abdominal hysterectomy    . Bladder surgery    . Colposcopy      Family History  Problem Relation Age of Onset  . Diabetes Mother   . Diabetes Maternal Grandmother   . Heart disease Neg Hx   . Hypertension Neg Hx     Social History:  reports that she has never smoked. She has never used smokeless tobacco. She reports that she does not drink alcohol or use illicit drugs.    Review of Systems   EDEMA: She was having significant edema and this was improved with Lasix but she ran out 2 days ago and her weight indicates increase fluid retention Previously was on Maxzide for hypertension, blood pressure is again high today She has not gone back to her PCP for follow-up  Hypertension: She has home meter with readings of 130s/ 80 but higher here No hypokalemia but her potassium is low normal    Lab Results  Component Value Date   CREATININE 0.85 12/26/2014   BUN 23 12/26/2014   NA 141 12/26/2014   K 3.5 12/26/2014   CL 101 12/26/2014   CO2 31 12/26/2014        Lipids: She had been on Lipitor, LDL has been previously high, around 180, this is managed by her PCP  She was recommended starting Crestor but she did not pick up the prescription , she thinks she had a rash with one of her previous medications possibly Lipitor      Lab Results  Component Value Date   CHOL 250* 09/30/2014   HDL 49.80 09/30/2014   LDLCALC 172* 09/30/2014   TRIG 138.0 09/30/2014   CHOLHDL 5 09/30/2014               She does have a history of mild Numbness but no tingling or burning in feet    Diabetic foot exam last done in 0000000  Complications from diabetes: Proliferative retinopathy, microalbuminuria   Physical Examination:  BP 142/88 mmHg  Pulse 89  Temp(Src) 98.3 F (36.8 C)  Resp 14  Ht 5\' 3"  (1.6 m)  Wt 237 lb 6.4 oz (107.684 kg)  BMI 42.06 kg/m2  SpO2 97%       2+ ankle edema present.   ASSESSMENT:  Diabetes type 2, with obesity See history of present illness for detailed discussion of his current management, blood sugar patterns and problems identified  Her blood sugars have been poorly controlled and home blood sugars are averaging  215   Currently on premixed insulin along with metformin She has refused additional treatment such as Tanzeum which probably would work well for her   Even though she was prescribed Jardiance she has not started this and not clear why she did not pick up the prescription , she also does not understand how this would work and help her glucose control   Also still has difficulty starting an exercise program She is also not interested in trying a basal bolus regimen   Hypertension: Blood pressure is relatively high and may improve with starting Jardiance  EDEMA, still not resolved, may improve with starting Jardiance but will start low-dose Maxzide especially since she has low normal potassium and high blood pressure , hold off on Lasix    PLAN:  Start Jardiance as above, discussed how this works Increase evening insulin by 5 units for  now Discussed timing and targets of blood sugar monitoring Regular exercise with walking  Start Crestor  Discussed adjustment of insulin if blood sugars improve with Jardiance   Patient Instructions  Need to pick up the following new prescriptions:  1. JARDIANCE 10 mg daily to be taken before breakfast for diabetes 2. Rosuvastatin/Crestor 5 mg daily for cholesterol 3. Triamterene HCTZ 25 mg, half tablet daily for blood pressure and fluid  INSULIN: Increase dose suppertime dose to 30 units for now, may need to reduce it back to 25 if bedtime or morning sugar is below 100  Check blood sugars on waking up 3-4 times a week Also check blood sugars about 2 hours after a meal and do this after different meals by rotation  Recommended blood sugar levels on waking up is 90-130 and  about 2 hours after meal is 130-160  Please bring your blood sugar monitor to each visit, thank you  Walk daily for exercise      [facility] 12/31/2014, 3:15 PM   Note: This office note was prepared with Dragon voice recognition system technology. Any transcriptional errors that result from this process are unintentional.

## 2015-01-09 ENCOUNTER — Ambulatory Visit (INDEPENDENT_AMBULATORY_CARE_PROVIDER_SITE_OTHER): Payer: Medicare Other | Admitting: Ophthalmology

## 2015-01-12 ENCOUNTER — Ambulatory Visit (INDEPENDENT_AMBULATORY_CARE_PROVIDER_SITE_OTHER): Payer: Medicare Other | Admitting: Ophthalmology

## 2015-02-02 ENCOUNTER — Ambulatory Visit: Payer: Medicare Other | Admitting: Endocrinology

## 2015-02-06 ENCOUNTER — Ambulatory Visit: Payer: Medicare Other | Admitting: Endocrinology

## 2015-02-09 ENCOUNTER — Ambulatory Visit: Payer: Medicare Other | Admitting: Endocrinology

## 2015-02-11 ENCOUNTER — Encounter: Payer: Self-pay | Admitting: Endocrinology

## 2015-02-11 ENCOUNTER — Ambulatory Visit (INDEPENDENT_AMBULATORY_CARE_PROVIDER_SITE_OTHER): Payer: Medicare Other | Admitting: Ophthalmology

## 2015-02-11 ENCOUNTER — Ambulatory Visit (INDEPENDENT_AMBULATORY_CARE_PROVIDER_SITE_OTHER): Payer: Medicare Other | Admitting: Endocrinology

## 2015-02-11 VITALS — BP 138/86 | HR 101 | Temp 97.7°F | Resp 16 | Ht 63.0 in | Wt 234.4 lb

## 2015-02-11 DIAGNOSIS — Z794 Long term (current) use of insulin: Secondary | ICD-10-CM

## 2015-02-11 DIAGNOSIS — E1165 Type 2 diabetes mellitus with hyperglycemia: Secondary | ICD-10-CM | POA: Diagnosis not present

## 2015-02-11 LAB — BASIC METABOLIC PANEL
BUN: 23 mg/dL (ref 6–23)
CO2: 29 mEq/L (ref 19–32)
Calcium: 9.8 mg/dL (ref 8.4–10.5)
Chloride: 102 mEq/L (ref 96–112)
Creatinine, Ser: 1.14 mg/dL (ref 0.40–1.20)
GFR: 62.25 mL/min (ref >60.00)
Glucose, Bld: 126 mg/dL — ABNORMAL HIGH (ref 70–99)
Potassium: 4.1 mEq/L (ref 3.5–5.1)
Sodium: 139 mEq/L (ref 135–145)

## 2015-02-11 MED ORDER — LISINOPRIL 10 MG PO TABS: 10.0000 mg | ORAL_TABLET | Freq: Every day | ORAL | Status: DC

## 2015-02-11 MED ORDER — EMPAGLIFLOZIN 25 MG PO TABS: 25.0000 mg | ORAL_TABLET | Freq: Every day | ORAL | Status: DC

## 2015-02-11 NOTE — Patient Instructions (Signed)
Check blood sugars on waking up 3  times a week  Also check blood sugars about 2 hours after a meal and do this after different meals by rotation  Recommended blood sugar levels on waking up is 90-130 and about 2 hours after meal is 130-160  Please bring your blood sugar monitor to each visit, thank you  1. Take 20mg  Jardiance in am and when out start 25mg  in am 2. Increase supper insulin dose to 35 3.  start using exercise video tape DAILY 4.  avoid fried food and high carbohydrate and high fat meals 5.  Stop triamterene/HCTZ  Since you are increasing Jardiance 6. Bring blood pressure monitor to each visit for comparison.  If blood pressure is over 80 on the bottom number start taking lisinopril 10 mg daily 7. Take Crestor after supper daily for cholesterol

## 2015-02-11 NOTE — Progress Notes (Signed)
Patient ID: Stephanie [name], female   DOB: 10-04-1955, 60 y.o.   MRN: [id number]           Reason for Appointment: Followup of diabetes  Referring physician: Hulan Fess  History of Present Illness:          Diagnosis: Type 2 diabetes mellitus, date of diagnosis: 2003       Past history:  She was initially treated metformin at some point glimepiride also added and not clear what her level of control was in the first few years. Had been only taking 500 mg twice a day of metformin and Amaryl increased to 4 mg twice a day several years ago In the last few years her blood sugar control has been more difficult and she has been managed by an endocrinologist Because of poor control she had been tried on Janumet and [place] but she states that she could not tolerate them because of headaches Had poor control with Amaryl and metformin and also difficulty with weight gain she had started Trulicity in [id number] on her initial consultation Her weight on her initial visit was 240 pounds. Her A1c had gone down to 7.1% in [id number] with using Trulicity which she was tolerating at that time However in 2/16 she stopped taking Trulicity as it was causing nausea , subsequently did not want to go on Tanzeum  Recent history:   INSULIN DOSE: Humalog Mix 75/25, 20 before breakfast and 30 before supper   Because of markedly increased sugars she has been on premixed insulin since 4/16 Also she is on metformin which she is taking at a dose of 1000 mg twice a day without any side effects  Because of marked hyperglycemia she was started on Jardiance 10 mg daily in 12/16 Also her  Insulin dose was increased on her last visit     Her glucose levels are somewhat better Last A1c is still high at 8.1    Current history, problems and blood sugar readings:  She has relatively higher fasting blood sugars and mostly high including some over 200   Occasionally may forget her evening insulin even though she is supposed to  take it before meal   she does not check readings after supper not clear how  How much they are postprandial.   She claims that she knows what to do with her diet but does not always follow it.  She can make better choices   Glucose readings in the afternoon are relatively better  She still has not started any exercise despite reminders and suggestions on how to do indoor exercise       Oral hypoglycemic drugs the patient is taking are: Metformin     Side effects from medications have been: Januvia, Onglyza apparently caused headaches, Invokana: Candidiasis   Compliance with the medical regimen: Fair    Hypoglycemia: none  Glucose monitoring:  done 1-2 time a day         Glucometer: One Touch Ultra.      Blood Glucose readings by download   Mean values apply above for all meters except median for One Touch  PRE-MEAL Fasting Lunch  7-9 PM Bedtime Overall  Glucose range:  146- 271  138, 150  141-360  ?   Mean/median:  180     182+/-64      Glycemic control:   Lab Results  Component Value Date   HGBA1C 8.1* 12/26/2014   HGBA1C 8.1* 09/30/2014   HGBA1C 11.2* 05/09/2014  Lab Results  Component Value Date   MICROALBUR 14.2* 05/09/2014   LDLCALC 172* 09/30/2014   CREATININE 0.85 12/26/2014    Self-care: The diet that the patient has been following is: tries to limit fats.      Meals: 2-3 meals per day. Breakfast is cereal or scrambled eggs. Lunch is half sandwich, yogurt and fruit; dinner usually baked chicken, bread and vegetables, snacks are fruits or nuts. Not eating out regularly, some fried foods Dinner 9-10 pm  Exercise: none          Dietician visit: Most recent: Several years ago.  CDE visit: 12/15              Weight history: Previous range 200-260  Wt Readings from Last 3 Encounters:  02/11/15 234 lb 6.4 oz (106.323 kg)  12/31/14 237 lb 6.4 oz (107.684 kg)  12/03/14 233 lb 9.6 oz (105.96 kg)     EDEMA: discussed in review of systems   LABS:  No  visits with results within 1 Week(s) from this visit. Latest known visit with results is:  Lab on 12/26/2014  Component Date Value Ref Range Status  . Sodium 12/26/2014 141  135 - 145 mEq/L Final  . Potassium 12/26/2014 3.5  3.5 - 5.1 mEq/L Final  . Chloride 12/26/2014 101  96 - 112 mEq/L Final  . CO2 12/26/2014 31  19 - 32 mEq/L Final  . Glucose, Bld 12/26/2014 92  70 - 99 mg/dL Final  . BUN 12/26/2014 23  6 - 23 mg/dL Final  . Creatinine, Ser 12/26/2014 0.85  0.40 - 1.20 mg/dL Final  . Calcium 12/26/2014 9.8  8.4 - 10.5 mg/dL Final  . GFR 12/26/2014 87.38  >60.00 mL/min Final  . Hgb A1c MFr Bld 12/26/2014 8.1* 4.6 - 6.5 % Final   Glycemic Control Guidelines for People with Diabetes:Non Diabetic:  <6%Goal of Therapy: <7%Additional Action Suggested:  >8%        Medication List       This list is accurate as of: 02/11/15 12:34 PM.  Always use your most recent med list.               empagliflozin 25 MG Tabs tablet  Commonly known as:  JARDIANCE  Take 25 mg by mouth daily.     glucose blood test strip  Commonly known as:  ONE TOUCH ULTRA TEST  Use as instructed to check blood sugar 3 times  a day dx code E11.65     hydrocortisone valerate cream 0.2 %  Commonly known as:  WESTCORT     Insulin Lispro Prot & Lispro (75-25) 100 UNIT/ML Kwikpen  Commonly known as:  HUMALOG MIX 75/25 KWIKPEN  Inject 18 units at breakfast and 16 units at supper     Insulin Pen Needle 32G X 4 MM Misc  Commonly known as:  BD PEN NEEDLE NANO U/F  Use 2 per day     lisinopril 10 MG tablet  Commonly known as:  PRINIVIL,ZESTRIL  Take 1 tablet (10 mg total) by mouth daily.     metFORMIN 1000 MG tablet  Commonly known as:  GLUCOPHAGE  Take 1 tablet (1,000 mg total) by mouth 2 (two) times daily with a meal.     rosuvastatin 5 MG tablet  Commonly known as:  CRESTOR  Take 1 tablet (5 mg total) by mouth daily.        Allergies:  Allergies  Allergen Reactions  . Avapro [Irbesartan] Other  (See Comments)  headaches  . Codeine Nausea And Vomiting    Past Medical History  Diagnosis Date  . Diabetes mellitus   . Asthma   . Hypertension   . Abnormal Pap smear   . LGSIL (low grade squamous intraepithelial dysplasia)     Past Surgical History  Procedure Laterality Date  . Abdominal hysterectomy    . Bladder surgery    . Colposcopy      Family History  Problem Relation Age of Onset  . Diabetes Mother   . Diabetes Maternal Grandmother   . Heart disease Neg Hx   . Hypertension Neg Hx     Social History:  reports that she has never smoked. She has never used smokeless tobacco. She reports that she does not drink alcohol or use illicit drugs.    Review of Systems   EDEMA: She was having significant edema , relatively better with starting Jardiance.    Hypertension: She has home meter with readings of 130s/ 80 but higher here No hypokalemia but her potassium is low normal    Currently still taking half tablet of Maxzide  Lab Results  Component Value Date   CREATININE 0.85 12/26/2014   BUN 23 12/26/2014   NA 141 12/26/2014   K 3.5 12/26/2014   CL 101 12/26/2014   CO2 31 12/26/2014        Lipids: She had been on Lipitor, LDL has been previously high, around 180, this is managed by her PCP  She was recommended starting Crestor but she believes she had some side effects like headache or malaise and does not know why she is not taking it      Lab Results  Component Value Date   CHOL 250* 09/30/2014   HDL 49.80 09/30/2014   LDLCALC 172* 09/30/2014   TRIG 138.0 09/30/2014   CHOLHDL 5 09/30/2014               She does have a history of mild Numbness but no tingling or burning in feet    Diabetic foot exam last done in 0000000  Complications from diabetes: Proliferative retinopathy, microalbuminuria   Physical Examination:  BP 138/86 mmHg  Pulse 101  Temp(Src) 97.7 F (36.5 C)  Resp 16  Ht 5\' 3"  (1.6 m)  Wt 234 lb 6.4 oz (106.323 kg)  BMI 41.53  kg/m2  SpO2 94%    repeat blood pressure 150/ 86    2+ ankle edema present.   ASSESSMENT:  Diabetes type 2, with obesity See history of present illness for detailed discussion of his current management, blood sugar patterns and problems identified  Her blood sugars have been poorly controlled   She is insulin resistant  She is doing a little better with adding Jardiance 10 mg and is tolerating this well.  Her weight is down 3 pounds, possibly from less edema  She does have high readings in the mornings and not clear if they are high after supper also  She does need to do better with diet   Discussed day-to-day management of her diabetes and how to improve her control Discussed consultation with dietitian but she refuses   Hypertension: Blood pressure is relatively high and may improve with  Increasing Jardiance , however she claims that her blood pressure is normal at home ;most likely does not need to continue  Maxzide  EDEMA, still not resolved, may improve with Jardiance 25 mg   HYPERLIPIDEMIA: She thinks she had a headache with Crestor and had some nonspecific  malaise and is not taking it  PLAN:   1. Take 20mg  Jardiance in am and when out start 25mg  in am 2. Increase supper insulin dose to 35 3.  start using exercise video tape DAILY 4.  avoid fried food and high carbohydrate and high fat meals 5.  Stop triamterene/HCTZ  Since you are increasing Jardiance 6. Bring blood pressure monitor to each visit for comparison.  If blood pressure is over 80 on the bottom number start taking lisinopril 10 mg daily 7. Take Crestor after supper daily for cholesterol     Patient Instructions  Check blood sugars on waking up 3  times a week  Also check blood sugars about 2 hours after a meal and do this after different meals by rotation  Recommended blood sugar levels on waking up is 90-130 and about 2 hours after meal is 130-160  Please bring your blood sugar monitor to each visit,  thank you  8. Take 20mg  Jardiance in am and when out start 25mg  in am 9. Increase supper insulin dose to 35 10.  start using exercise video tape DAILY 11.  avoid fried food and high carbohydrate and high fat meals 12.  Stop triamterene/HCTZ  Since you are increasing Jardiance 13. Bring blood pressure monitor to each visit for comparison.  If blood pressure is over 80 on the bottom number start taking lisinopril 10 mg daily 14. Take Crestor after supper daily for cholesterol      Stephanie [name] 02/11/2015, 12:34 PM   Note: This office note was prepared with Dragon voice recognition system technology. Any transcriptional errors that result from this process are unintentional.

## 2015-02-15 ENCOUNTER — Other Ambulatory Visit: Payer: Self-pay | Admitting: Endocrinology

## 2015-02-17 ENCOUNTER — Telehealth: Payer: Self-pay | Admitting: Endocrinology

## 2015-02-17 NOTE — Telephone Encounter (Signed)
Patient called stating that she would like to speak with Suanne Marker regarding her medication   Dr. Dwyane Dee had taken her off lasix and she is having some issues   Please advise

## 2015-02-18 MED ORDER — INSULIN LISPRO PROT & LISPRO (75-25 MIX) 100 UNIT/ML KWIKPEN: PEN_INJECTOR | SUBCUTANEOUS | Status: DC

## 2015-02-18 NOTE — Telephone Encounter (Signed)
She can go back to taking it once a day

## 2015-02-18 NOTE — Telephone Encounter (Signed)
Pt calling to speak about meds please

## 2015-02-18 NOTE — Telephone Encounter (Signed)
Noted, patient is aware. 

## 2015-02-18 NOTE — Telephone Encounter (Signed)
Patient called, she said since she's stopped her Triamterene/HCTZ she is staying swollen, she said her feet and legs are swelled and she feels very bloated. Please advise.

## 2015-02-27 ENCOUNTER — Other Ambulatory Visit: Payer: Medicare Other

## 2015-03-27 ENCOUNTER — Other Ambulatory Visit (INDEPENDENT_AMBULATORY_CARE_PROVIDER_SITE_OTHER): Payer: Medicare Other

## 2015-03-27 ENCOUNTER — Other Ambulatory Visit: Payer: Self-pay | Admitting: *Deleted

## 2015-03-27 DIAGNOSIS — E1165 Type 2 diabetes mellitus with hyperglycemia: Secondary | ICD-10-CM | POA: Diagnosis not present

## 2015-03-27 DIAGNOSIS — Z794 Long term (current) use of insulin: Secondary | ICD-10-CM | POA: Diagnosis not present

## 2015-03-27 LAB — LIPID PANEL
Cholesterol: 269 mg/dL — ABNORMAL HIGH (ref 0–200)
HDL: 49.1 mg/dL (ref >39.00)
LDL Cholesterol: 200 mg/dL — ABNORMAL HIGH (ref 0–99)
NonHDL: 220.05
Total CHOL/HDL Ratio: 5
Triglycerides: 101 mg/dL (ref 0.0–149.0)
VLDL: 20.2 mg/dL (ref 0.0–40.0)

## 2015-03-27 LAB — COMPREHENSIVE METABOLIC PANEL
ALT: 11 U/L (ref 0–35)
AST: 12 U/L (ref 0–37)
Albumin: 4 g/dL (ref 3.5–5.2)
Alkaline Phosphatase: 59 U/L (ref 39–117)
BUN: 22 mg/dL (ref 6–23)
CO2: 30 mEq/L (ref 19–32)
Calcium: 9.9 mg/dL (ref 8.4–10.5)
Chloride: 102 mEq/L (ref 96–112)
Creatinine, Ser: 1.02 mg/dL (ref 0.40–1.20)
GFR: 70.74 mL/min (ref >60.00)
Glucose, Bld: 81 mg/dL (ref 70–99)
Potassium: 3.6 mEq/L (ref 3.5–5.1)
Sodium: 140 mEq/L (ref 135–145)
Total Bilirubin: 0.4 mg/dL (ref 0.2–1.2)
Total Protein: 8 g/dL (ref 6.0–8.3)

## 2015-03-27 LAB — HEMOGLOBIN A1C: Hgb A1c MFr Bld: 7.4 % — ABNORMAL HIGH (ref 4.6–6.5)

## 2015-03-27 MED ORDER — INSULIN ASPART PROT & ASPART (70-30 MIX) 100 UNIT/ML PEN: PEN_INJECTOR | SUBCUTANEOUS | Status: DC

## 2015-03-30 ENCOUNTER — Other Ambulatory Visit: Payer: Self-pay | Admitting: Endocrinology

## 2015-04-01 ENCOUNTER — Ambulatory Visit (INDEPENDENT_AMBULATORY_CARE_PROVIDER_SITE_OTHER): Payer: Medicare Other | Admitting: Endocrinology

## 2015-04-01 ENCOUNTER — Other Ambulatory Visit: Payer: Self-pay | Admitting: *Deleted

## 2015-04-01 ENCOUNTER — Encounter: Payer: Self-pay | Admitting: Endocrinology

## 2015-04-01 VITALS — BP 150/82 | HR 100 | Temp 98.5°F | Resp 14 | Ht 63.0 in | Wt 240.8 lb

## 2015-04-01 DIAGNOSIS — Z794 Long term (current) use of insulin: Secondary | ICD-10-CM

## 2015-04-01 DIAGNOSIS — E785 Hyperlipidemia, unspecified: Secondary | ICD-10-CM

## 2015-04-01 DIAGNOSIS — E1165 Type 2 diabetes mellitus with hyperglycemia: Secondary | ICD-10-CM | POA: Diagnosis not present

## 2015-04-01 MED ORDER — ROSUVASTATIN CALCIUM 5 MG PO TABS: 5.0000 mg | ORAL_TABLET | Freq: Every day | ORAL | Status: DC

## 2015-04-01 MED ORDER — EMPAGLIFLOZIN 25 MG PO TABS: 25.0000 mg | ORAL_TABLET | Freq: Every day | ORAL | Status: DC

## 2015-04-01 NOTE — Patient Instructions (Addendum)
Check blood sugars on waking up 3-4  times a week Also check blood sugars about 2 hours after a meal and do this after different meals by rotation  Recommended blood sugar levels on waking up is 90-130 and about 2 hours after meal is 130-160  Please bring your blood sugar monitor to each visit, thank you  Start exercise daily, even 10 minutes  Continue same dose of insulin twice a day Continue metformin twice a day as it will keep the insulin working better without weight gain  Start JARDIANCE 25 mg in the morning daily for the diabetes  With starting Jardiance reduce Lasix to half tablet  Start taking Crestor after supper for cholesterol.

## 2015-04-01 NOTE — Progress Notes (Signed)
Patient ID: Stephanie [name], female   DOB: [birth year], 59 y.o.   MRN: [id number]           Reason for Appointment: Followup of diabetes  Referring physician: Hulan Fess  History of Present Illness:          Diagnosis: Type 2 diabetes mellitus, date of diagnosis: 2003       Past history:  She was initially treated metformin at some point glimepiride also added and not clear what her level of control was in the first few years. Had been only taking 500 mg twice a day of metformin and Amaryl increased to 4 mg twice a day several years ago In the last few years her blood sugar control has been more difficult and she has been managed by an endocrinologist Because of poor control she had been tried on Janumet and [place] but she states that she could not tolerate them because of headaches Had poor control with Amaryl and metformin and also difficulty with weight gain she had started Trulicity in [id number] on her initial consultation Her weight on her initial visit was 240 pounds. Her A1c had gone down to 7.1% in [id number] with using Trulicity which she was tolerating at that time However in 2/16 she stopped taking Trulicity as it was causing nausea , subsequently did not want to go on [place]um  Recent history:   INSULIN DOSE: Novolog Mix 75/25, 20 before breakfast and 35 before supper   Because of markedly increased sugars she has been on premixed insulin since 4/16 Also she is on metformin which she is taking at a dose of 1000 mg twice a day without any side effects  Because of marked hyperglycemia she was started on Jardiance 10 mg daily in 12/16  Insulin dose was increased on her last visit     Her glucose levels are looking better Last A1c is 7.4, previously 8.1  Current history, problems and blood sugar readings:  She did not increase her Jardiance as directed and is still taking 10 mg and also she has stopped this about a week or so ago as she ran out  Blood sugars appear to be  significantly higher with stopping Jardiance.  Fasting readings have been as low as 108 when she was taking Jardiance and suppertime readings were improved also  Has not done any readings after supper lately and is checking sugars somewhat sporadically   Glucose readings in the lunchtime  are relatively better  She still has not started any exercise despite reminders and she complains of fatigue  She was referred to the dietitian twice last year but she canceled the appointments       Oral hypoglycemic drugs the patient is taking are: Metformin 1 g twice a day, Jardiance 10 mg daily     Side effects from medications have been: Januvia, Onglyza apparently caused headaches, Invokana: Candidiasis   Compliance with the medical regimen: Fair    Hypoglycemia: none  Glucose monitoring:  done 1-2 time a day         Glucometer: One Touch Ultra.      Blood Glucose readings by download   Mean values apply above for all meters except median for One Touch  PRE-MEAL Fasting Lunch Dinner Bedtime Overall  Glucose range: 108-239  106-165  80-288  180-276    Mean/median: 185     168       Glycemic control:   Lab Results  Component Value Date   HGBA1C  7.4* 03/27/2015   HGBA1C 8.1* 12/26/2014   HGBA1C 8.1* 09/30/2014   Lab Results  Component Value Date   MICROALBUR 14.2* 05/09/2014   LDLCALC 200* 03/27/2015   CREATININE 1.02 03/27/2015    Self-care: The diet that the patient has been following is: tries to limit fats.      Meals: 2-3 meals per day. Breakfast is cereal or scrambled eggs. Lunch is half sandwich, yogurt and fruit; dinner usually baked chicken, bread and vegetables, snacks are fruits or nuts. Not eating out regularly, some fried foods Dinner 9-10 pm  Exercise: none          Dietician visit: Most recent: Several years ago.  CDE visit: 12/15              Weight history: Previous range 200-260  Wt Readings from Last 3 Encounters:  04/01/15 240 lb 12.8 oz (109.226 kg)    02/11/15 234 lb 6.4 oz (106.323 kg)  12/31/14 237 lb 6.4 oz (107.684 kg)     EDEMA: discussed in review of systems   LABS:  Appointment on 03/27/2015  Component Date Value Ref Range Status  . Hgb A1c MFr Bld 03/27/2015 7.4* 4.6 - 6.5 % Final   Glycemic Control Guidelines for People with Diabetes:Non Diabetic:  <6%Goal of Therapy: <7%Additional Action Suggested:  >8%   . Sodium 03/27/2015 140  135 - 145 mEq/L Final  . Potassium 03/27/2015 3.6  3.5 - 5.1 mEq/L Final  . Chloride 03/27/2015 102  96 - 112 mEq/L Final  . CO2 03/27/2015 30  19 - 32 mEq/L Final  . Glucose, Bld 03/27/2015 81  70 - 99 mg/dL Final  . BUN 03/27/2015 22  6 - 23 mg/dL Final  . Creatinine, Ser 03/27/2015 1.02  0.40 - 1.20 mg/dL Final  . Total Bilirubin 03/27/2015 0.4  0.2 - 1.2 mg/dL Final  . Alkaline Phosphatase 03/27/2015 59  39 - 117 U/L Final  . AST 03/27/2015 12  0 - 37 U/L Final  . ALT 03/27/2015 11  0 - 35 U/L Final  . Total Protein 03/27/2015 8.0  6.0 - 8.3 g/dL Final  . Albumin 03/27/2015 4.0  3.5 - 5.2 g/dL Final  . Calcium 03/27/2015 9.9  8.4 - 10.5 mg/dL Final  . GFR 03/27/2015 70.74  >60.00 mL/min Final  . Cholesterol 03/27/2015 269* 0 - 200 mg/dL Final   ATP III Classification       Desirable:  < 200 mg/dL               Borderline High:  200 - 239 mg/dL          High:  > = 240 mg/dL  . Triglycerides 03/27/2015 101.0  0.0 - 149.0 mg/dL Final   Normal:  <150 mg/dLBorderline High:  150 - 199 mg/dL  . HDL 03/27/2015 49.10  >39.00 mg/dL Final  . VLDL 03/27/2015 20.2  0.0 - 40.0 mg/dL Final  . LDL Cholesterol 03/27/2015 200* 0 - 99 mg/dL Final  . Total CHOL/HDL Ratio 03/27/2015 5   Final                  Men          Women1/2 Average Risk     3.4          3.3Average Risk          5.0          4.42X Average Risk          9.6  7.13X Average Risk          15.0          11.0                      . NonHDL 03/27/2015 220.05   Final   NOTE:  Non-HDL goal should be 30 mg/dL higher than patient's LDL  goal (i.e. LDL goal of < 70 mg/dL, would have non-HDL goal of < 100 mg/dL)       Medication List       This list is accurate as of: 04/01/15  3:03 PM.  Always use your most recent med list.               empagliflozin 25 MG Tabs tablet  Commonly known as:  JARDIANCE  Take 25 mg by mouth daily.     furosemide 40 MG tablet  Commonly known as:  LASIX     glucose blood test strip  Commonly known as:  ONE TOUCH ULTRA TEST  Use as instructed to check blood sugar 3 times  a day dx code E11.65     hydrocortisone valerate cream 0.2 %  Commonly known as:  WESTCORT     insulin aspart protamine - aspart (70-30) 100 UNIT/ML FlexPen  Commonly known as:  NOVOLOG MIX 70/30 FLEXPEN  Inject 20 units at breakfast and 35 units at dinner.     Insulin Lispro Prot & Lispro (75-25) 100 UNIT/ML Kwikpen  Commonly known as:  HUMALOG MIX 75/25 KWIKPEN  INJECT 20 UNITS INTO THE SKIN AT BREAKFAST AND 35 UNITS AT SUPPER     Insulin Pen Needle 32G X 4 MM Misc  Commonly known as:  BD PEN NEEDLE NANO U/F  Use 2 per day     lisinopril 10 MG tablet  Commonly known as:  PRINIVIL,ZESTRIL  Take 1 tablet (10 mg total) by mouth daily.     metFORMIN 1000 MG tablet  Commonly known as:  GLUCOPHAGE  Take 1 tablet (1,000 mg total) by mouth 2 (two) times daily with a meal.     metFORMIN 1000 MG tablet  Commonly known as:  GLUCOPHAGE  TAKE ONE TABLET BY MOUTH TWICE DAILY WITH MEALS     rosuvastatin 5 MG tablet  Commonly known as:  CRESTOR  Take 1 tablet (5 mg total) by mouth daily.        Allergies:  Allergies  Allergen Reactions  . Avapro [Irbesartan] Other (See Comments)    headaches  . Codeine Nausea And Vomiting    Past Medical History  Diagnosis Date  . Diabetes mellitus   . Asthma   . Hypertension   . Abnormal Pap smear   . LGSIL (low grade squamous intraepithelial dysplasia)     Past Surgical History  Procedure Laterality Date  . Abdominal hysterectomy    . Bladder surgery    .  Colposcopy      Family History  Problem Relation Age of Onset  . Diabetes Mother   . Diabetes Maternal Grandmother   . Heart disease Neg Hx   . Hypertension Neg Hx     Social History:  reports that she has never smoked. She has never used smokeless tobacco. She reports that she does not drink alcohol or use illicit drugs.    Review of Systems   EDEMA: She was having significant edema, now starting Lasix On her own.  Hypertension: She has higher reading now with stopping [employer]  Component Value Date   CREATININE 1.02 03/27/2015   BUN 22 03/27/2015   NA 140 03/27/2015   K 3.6 03/27/2015   CL 102 03/27/2015   CO2 30 03/27/2015        Lipids: She had been on Lipitor, LDL has been previously high, around 180, this is managed by her PCP  She was recommended starting Crestor but She still has not started this, she thinks she does not have a prescription She is not understanding importance of treating her lipids and the results were discussed      Lab Results  Component Value Date   CHOL 269* 03/27/2015   HDL 49.10 03/27/2015   LDLCALC 200* 03/27/2015   TRIG 101.0 03/27/2015   CHOLHDL 5 03/27/2015               She does have a history of mild Numbness but no tingling or burning in feet    Diabetic foot exam last done in 0000000  Complications from diabetes: Proliferative retinopathy, microalbuminuria   Physical Examination:  BP 150/82 mmHg  Pulse 100  Temp(Src) 98.5 F (36.9 C)  Resp 14  Ht 5\' 3"  (1.6 m)  Wt 240 lb 12.8 oz (109.226 kg)  BMI 42.67 kg/m2  SpO2 97%       2+ ankle edema present.   ASSESSMENT:  Diabetes type 2, with obesity See history of present illness for detailed discussion of his current management, blood sugar patterns and problems identified  Her blood sugars have been poorly controlled But seemed to be better when she was taking Jardiance Also had relatively better fasting readings with increasing her evening dose Not  clear if her sugars after supper are controlled Her weight has gone up 6 pounds  She is somewhat unreliable with following her instructions even though she is explained what to do when she gets her after visit summary  For now will increase her Jardiance and not change insulin Discussed consultation with dietitian and will schedule   Hypertension: Blood pressure is relatively high and may improve with  Increasing Jardiance , however she claims that her blood pressure is normal at home ;most likely does not need to continue  Maxzide  EDEMA, still not resolved, may improve with Jardiance 25 mg   HYPERLIPIDEMIA: She thinks she had a headache with Crestor and had some nonspecific malaise and is not taking it  PLAN:   1. Take 25 mg Jardiance in am  2. Bring blood pressure monitor to each visit for comparison.  If blood pressure is over 80 on the bottom number start taking lisinopril 10 mg daily 3. She needs to reduce her Lasix to half tablet when starting 25 mg Jardiance 4. Regular exercise 5. Follow-up with PCP for complaints of fatigue and other symptoms she is asking about today 6. Take Crestor after supper daily for cholesterol 7. Consultation with the dietitian to be done. 8. More readings after supper     Patient Instructions  Check blood sugars on waking up 3-4  times a week Also check blood sugars about 2 hours after a meal and do this after different meals by rotation  Recommended blood sugar levels on waking up is 90-130 and about 2 hours after meal is 130-160  Please bring your blood sugar monitor to each visit, thank you  Start exercise daily, even 10 minutes  Continue same dose of insulin twice a day Continue metformin twice a day as it will keep the insulin working better without  weight gain  Start JARDIANCE 25 mg in the morning daily for the diabetes  With starting Jardiance reduce Lasix to half tablet  Start taking Crestor after supper for  cholesterol.       Counseling time on subjects discussed above is over 50% of today's 25 minute visit   Webster Patrone 04/01/2015, 3:03 PM   Note: This office note was prepared with Estate agent. Any transcriptional errors that result from this process are unintentional.

## 2015-05-06 ENCOUNTER — Other Ambulatory Visit: Payer: Medicare Other

## 2015-05-08 ENCOUNTER — Other Ambulatory Visit: Payer: Self-pay | Admitting: Endocrinology

## 2015-05-13 ENCOUNTER — Other Ambulatory Visit (INDEPENDENT_AMBULATORY_CARE_PROVIDER_SITE_OTHER): Payer: Medicare Other

## 2015-05-13 ENCOUNTER — Ambulatory Visit: Payer: Medicare Other | Admitting: Endocrinology

## 2015-05-13 DIAGNOSIS — Z794 Long term (current) use of insulin: Secondary | ICD-10-CM | POA: Diagnosis not present

## 2015-05-13 DIAGNOSIS — E1165 Type 2 diabetes mellitus with hyperglycemia: Secondary | ICD-10-CM | POA: Diagnosis not present

## 2015-05-13 LAB — BASIC METABOLIC PANEL
BUN: 18 mg/dL (ref 6–23)
CO2: 29 mEq/L (ref 19–32)
Calcium: 9.8 mg/dL (ref 8.4–10.5)
Chloride: 101 mEq/L (ref 96–112)
Creatinine, Ser: 1.17 mg/dL (ref 0.40–1.20)
GFR: 60.36 mL/min (ref >60.00)
Glucose, Bld: 246 mg/dL — ABNORMAL HIGH (ref 70–99)
Potassium: 3.9 mEq/L (ref 3.5–5.1)
Sodium: 140 mEq/L (ref 135–145)

## 2015-05-15 LAB — FRUCTOSAMINE: Fructosamine: 306 umol/L — ABNORMAL HIGH (ref 0–285)

## 2015-05-19 ENCOUNTER — Other Ambulatory Visit: Payer: Self-pay | Admitting: Endocrinology

## 2015-05-20 NOTE — Telephone Encounter (Signed)
Patient is requesting a refill of Furosemide, I don't see where you have rx'd that for her before? Please advise

## 2015-05-27 ENCOUNTER — Ambulatory Visit (INDEPENDENT_AMBULATORY_CARE_PROVIDER_SITE_OTHER): Payer: Medicare Other | Admitting: Endocrinology

## 2015-05-27 ENCOUNTER — Encounter: Payer: Self-pay | Admitting: Endocrinology

## 2015-05-27 VITALS — BP 150/82 | HR 102 | Temp 98.8°F | Resp 14 | Ht 63.0 in | Wt 234.6 lb

## 2015-05-27 DIAGNOSIS — E1165 Type 2 diabetes mellitus with hyperglycemia: Secondary | ICD-10-CM | POA: Diagnosis not present

## 2015-05-27 DIAGNOSIS — I10 Essential (primary) hypertension: Secondary | ICD-10-CM | POA: Diagnosis not present

## 2015-05-27 DIAGNOSIS — Z794 Long term (current) use of insulin: Secondary | ICD-10-CM | POA: Diagnosis not present

## 2015-05-27 DIAGNOSIS — R600 Localized edema: Secondary | ICD-10-CM

## 2015-05-27 NOTE — Progress Notes (Signed)
Patient ID: Stephanie [name], female   DOB: Feb 26, 1955, 60 y.o.   MRN: [id number]           Reason for Appointment: Followup of diabetes  Referring physician: Hulan Fess  History of Present Illness:          Diagnosis: Type 2 diabetes mellitus, date of diagnosis: 2003       Past history:  She was initially treated metformin at some point glimepiride also added and not clear what her level of control was in the first few years. Had been only taking 500 mg twice a day of metformin and Amaryl increased to 4 mg twice a day several years ago In the last few years her blood sugar control has been more difficult and she has been managed by an endocrinologist Because of poor control she had been tried on Janumet and [place] but she states that she could not tolerate them because of headaches Had poor control with Amaryl and metformin and also difficulty with weight gain she had started Trulicity in [id number] on her initial consultation Her weight on her initial visit was 240 pounds. Her A1c had gone down to 7.1% in [id number] with using Trulicity which she was tolerating at that time However in 2/16 she stopped taking Trulicity as it was causing nausea , subsequently did not want to go on [place] Because of markedly increased sugars she has been on premixed insulin since 4/16  Recent history:   INSULIN DOSE: Novolog Mix 75/25, 20 before breakfast and 35 before supper   When blood sugars were poorly controlled in 12/16 she was started on Jardiance 10 mg daily  This was increased to 25 mg in 3/17 because of persistently high sugars Insulin doses have been increased periodically also She has been reluctant to take basal bolus insulin regimen and only wants to take 1 kind of insulin because of cost and convenience    Her glucose levels are still overall high although fructosamine is only mildly increased at 306  Last A1c is 7.4, previously 8.1  Current history, problems and blood sugar  readings:  She appears to have persistently high fasting readings  Glucose readings are somewhat better in the midday and afternoon but she is not checking many readings  Her readings are fairly consistently high after evening meal also; overall checking blood sugar less than once a day now  She still has not started any exercise despite reminders   She was referred to the dietitian twice last year but she canceled the appointments and is reluctant to do this again and she thinks she knows what to do       Oral hypoglycemic drugs the patient is taking are: Metformin 1 g twice a day, Jardiance 10 mg daily     Side effects from medications have been: Januvia, Onglyza apparently caused headaches, Invokana: Candidiasis   Compliance with the medical regimen: Fair    Hypoglycemia: none  Glucose monitoring:  done 1-2 time a day         Glucometer: One Touch Ultra.      Blood Glucose readings by download   Mean values apply above for all meters except median for One Touch  PRE-MEAL Fasting Lunch Dinner Bedtime Overall  Glucose range: 143-227  104, 158  122, 238  150-295    Mean/median:     178     Glycemic control:   Lab Results  Component Value Date   HGBA1C 7.4* 03/27/2015   HGBA1C 8.1*  12/26/2014   HGBA1C 8.1* 09/30/2014   Lab Results  Component Value Date   MICROALBUR 14.2* 05/09/2014   LDLCALC 200* 03/27/2015   CREATININE 1.17 05/13/2015    Self-care: The diet that the patient has been following is: tries to limit fats with most meals .      Meals: 2-3 meals per day. Breakfast is cereal or scrambled eggs. Lunch is half sandwich, yogurt and fruit; dinner usually baked chicken, bread and vegetables, snacks are fruits or nuts. Not eating out regularly, some fried foods Dinner can be 9-10 pm  Exercise: none          Dietician visit: Most recent: Several years ago.  CDE visit: 12/15              Weight history: Previous range 200-260  Wt Readings from Last 3 Encounters:   05/27/15 234 lb 9.6 oz (106.414 kg)  04/01/15 240 lb 12.8 oz (109.226 kg)  02/11/15 234 lb 6.4 oz (106.323 kg)     EDEMA: discussed in review of systems   LABS:  No visits with results within 1 Week(s) from this visit. Latest known visit with results is:  Lab on 05/13/2015  Component Date Value Ref Range Status  . Sodium 05/13/2015 140  135 - 145 mEq/L Final  . Potassium 05/13/2015 3.9  3.5 - 5.1 mEq/L Final  . Chloride 05/13/2015 101  96 - 112 mEq/L Final  . CO2 05/13/2015 29  19 - 32 mEq/L Final  . Glucose, Bld 05/13/2015 246* 70 - 99 mg/dL Final  . BUN 05/13/2015 18  6 - 23 mg/dL Final  . Creatinine, Ser 05/13/2015 1.17  0.40 - 1.20 mg/dL Final  . Calcium 05/13/2015 9.8  8.4 - 10.5 mg/dL Final  . GFR 05/13/2015 60.36  >60.00 mL/min Final  . Fructosamine 05/13/2015 306* 0 - 285 umol/L Final   Comment: Published reference interval for apparently healthy subjects between age 51 and 89 is 59 - 285 umol/L and in a poorly controlled diabetic population is 228 - 563 umol/L with a mean of 396 umol/L.        Medication List       This list is accurate as of: 05/27/15  9:24 PM.  Always use your most recent med list.               empagliflozin 25 MG Tabs tablet  Commonly known as:  JARDIANCE  Take 25 mg by mouth daily.     furosemide 40 MG tablet  Commonly known as:  LASIX  Reported on 05/27/2015     furosemide 40 MG tablet  Commonly known as:  LASIX  TAKE ONE TABLET BY MOUTH ONCE DAILY     hydrocortisone valerate cream 0.2 %  Commonly known as:  WESTCORT     insulin aspart protamine - aspart (70-30) 100 UNIT/ML FlexPen  Commonly known as:  NOVOLOG MIX 70/30 FLEXPEN  Inject 20 units at breakfast and 35 units at dinner.     Insulin Pen Needle 32G X 4 MM Misc  Commonly known as:  BD PEN NEEDLE NANO U/F  Use 2 per day     BD PEN NEEDLE NANO U/F 32G X 4 MM Misc  Generic drug:  Insulin Pen Needle  USE TWO NEEDLES DAILY     lisinopril 10 MG tablet  Commonly  known as:  PRINIVIL,ZESTRIL  Take 1 tablet (10 mg total) by mouth daily.     metFORMIN 1000 MG tablet  Commonly known as:  GLUCOPHAGE  Take 1 tablet (1,000 mg total) by mouth 2 (two) times daily with a meal.     ONE TOUCH ULTRA TEST test strip  Generic drug:  glucose blood  USE AS DIRECTED ONCE DAILY TO CHECK BLOOD SUGAR     rosuvastatin 5 MG tablet  Commonly known as:  CRESTOR  Take 1 tablet (5 mg total) by mouth daily.        Allergies:  Allergies  Allergen Reactions  . Avapro [Irbesartan] Other (See Comments)    headaches  . Codeine Nausea And Vomiting    Past Medical History  Diagnosis Date  . Diabetes mellitus   . Asthma   . Hypertension   . Abnormal Pap smear   . LGSIL (low grade squamous intraepithelial dysplasia)     Past Surgical History  Procedure Laterality Date  . Abdominal hysterectomy    . Bladder surgery    . Colposcopy      Family History  Problem Relation Age of Onset  . Diabetes Mother   . Diabetes Maternal Grandmother   . Heart disease Neg Hx   . Hypertension Neg Hx     Social History:  reports that she has never smoked. She has never used smokeless tobacco. She reports that she does not drink alcohol or use illicit drugs.    Review of Systems   EDEMA: She was having significant edema, now Improving with taking 25 mg Jardiance and also half tablet Lasix  Hypertension: She has higher readings in the office but she thinks she gets anxious and her readings are mostly in the 130s at home, however has not checked regularly recently   Lab Results  Component Value Date   CREATININE 1.17 05/13/2015   BUN 18 05/13/2015   NA 140 05/13/2015   K 3.9 05/13/2015   CL 101 05/13/2015   CO2 29 05/13/2015        Lipids: She had been on Lipitor, LDL has been previously high, around 180, this is managed by her PCP   She was recommended starting Crestor, takes hs regularly Since her last visit      Lab Results  Component Value Date   CHOL 269*  03/27/2015   HDL 49.10 03/27/2015   LDLCALC 200* 03/27/2015   TRIG 101.0 03/27/2015   CHOLHDL 5 03/27/2015               She does have a history of mild Numbness but no tingling or burning in feet    Diabetic foot exam last done in 0000000  Complications from diabetes: Proliferative retinopathy, microalbuminuria   Physical Examination:  BP 150/82 mmHg  Pulse 102  Temp(Src) 98.8 F (37.1 C) (Oral)  Resp 14  Ht 5\' 3"  (1.6 m)  Wt 234 lb 9.6 oz (106.414 kg)  BMI 41.57 kg/m2  SpO2 91%   Repeat blood pressure 148/76     1+ ankle edema present.   ASSESSMENT:  Diabetes type 2, with obesity See history of present illness for detailed discussion of his current management, blood sugar patterns and problems identified  Her blood sugars have been poorly controlled although somewhat better with adding Jardiance. Dinner still not at target especially higher in the evenings and overnight This may be partly related to insulin resistance, and consistent diet and lack of exercise Her weight is slightly better partly from relief of edema Discussed consultation with dietitian and she finally agrees, will schedule   Hypertension: Blood pressure is relatively high but she claims that  readings are better at home and she gets white coat syndrome  EDEMA, still not resolved,  improved with Jardiance 25 mg   HYPERLIPIDEMIA: She has taking Crestor more regularly and will need follow-up labs  PLAN:   1. Take 25 mg Jardiance as before 2. Increase evening insulin to 38 units, discussed timing of taking the insulin which she is not following and she needs to take it 15 minutes before eating usually 3. Start checking blood pressure at home regularly and bring monitor for comparison also; discussed that she may need to go back on lisinopril if needed 4. Start walking regularly 5. More blood sugars after breakfast and lunch 6. Consistently low carbohydrate diet with smaller portions     Patient  Instructions  Walk daily  INSULIN 38 UNITS , take within 15 min of the meal  Check Bp weekly, if over 140 call for Lisinopril if >140     Counseling time on subjects discussed above is over 50% of today's 25 minute visit   Carrie Schoonmaker 05/27/2015, 9:24 PM   Note: This office note was prepared with Estate agent. Any transcriptional errors that result from this process are unintentional.   t

## 2015-05-27 NOTE — Patient Instructions (Signed)
Walk daily  INSULIN 38 UNITS , take within 15 min of the meal  Check Bp weekly, if over 140 call for Lisinopril if >140

## 2015-06-01 ENCOUNTER — Telehealth: Payer: Self-pay | Admitting: Endocrinology

## 2015-06-01 MED ORDER — LISINOPRIL 10 MG PO TABS: 10.0000 mg | ORAL_TABLET | Freq: Every day | ORAL | Status: DC

## 2015-06-01 NOTE — Telephone Encounter (Signed)
Lisinopril 10mg  qd

## 2015-06-01 NOTE — Telephone Encounter (Signed)
I contacted the pt and advised we would be starting lisinopril 10 mg. She voiced understanding.

## 2015-06-01 NOTE — Telephone Encounter (Signed)
See note below and please advise, Thanks! 

## 2015-06-01 NOTE — Telephone Encounter (Signed)
Pt feels like her bp is too high she would like something to help her she has not been well all weekend  bp has been over 140/80

## 2015-06-02 ENCOUNTER — Telehealth: Payer: Self-pay | Admitting: Endocrinology

## 2015-06-02 NOTE — Telephone Encounter (Signed)
Please see below and advise.

## 2015-06-02 NOTE — Telephone Encounter (Signed)
Need to know exactly what symptoms she has from taking lisinopril. She can take generic Diovan 80 mg daily instead

## 2015-06-02 NOTE — Telephone Encounter (Signed)
PT called and said that she cannot take Lisinopril, she is allergic to that.  She needs something else called in.

## 2015-06-03 MED ORDER — VALSARTAN 80 MG PO TABS: 80.0000 mg | ORAL_TABLET | Freq: Every day | ORAL | Status: DC

## 2015-06-03 NOTE — Telephone Encounter (Signed)
Patient said she had itching, coughing and a rash from the lisinopril, she is willing to try the Diovan, rx sent

## 2015-06-26 ENCOUNTER — Encounter: Payer: Medicare Other | Attending: Endocrinology | Admitting: Dietician

## 2015-06-26 ENCOUNTER — Encounter: Payer: Self-pay | Admitting: Dietician

## 2015-06-26 VITALS — Ht 63.5 in | Wt 232.0 lb

## 2015-06-26 DIAGNOSIS — E669 Obesity, unspecified: Secondary | ICD-10-CM | POA: Insufficient documentation

## 2015-06-26 DIAGNOSIS — IMO0002 Reserved for concepts with insufficient information to code with codable children: Secondary | ICD-10-CM

## 2015-06-26 DIAGNOSIS — Z794 Long term (current) use of insulin: Secondary | ICD-10-CM

## 2015-06-26 DIAGNOSIS — Z713 Dietary counseling and surveillance: Secondary | ICD-10-CM | POA: Diagnosis not present

## 2015-06-26 DIAGNOSIS — E118 Type 2 diabetes mellitus with unspecified complications: Secondary | ICD-10-CM

## 2015-06-26 DIAGNOSIS — E1165 Type 2 diabetes mellitus with hyperglycemia: Secondary | ICD-10-CM

## 2015-06-26 DIAGNOSIS — E119 Type 2 diabetes mellitus without complications: Secondary | ICD-10-CM | POA: Diagnosis not present

## 2015-06-26 NOTE — Progress Notes (Signed)
Medical Nutrition Therapy:  Appt start time: 1330 end time:  [id number].   Assessment:  Primary concerns today: Patient is here alone.  She states that she knows what to eat but has come at the recommendation of Dr. Dwyane Dee.  She states that she struggles with portion control and eating foods that she knows are not the best.  She also does not like to take medicine and will forget the insulin at times and takes the metformin only once per day.  Hx also includes:  HTN, hyperlipidemia and bilateral leg edemia. Labs include:   GFR 60 and Fructosamine 306 (05/13/15), HgbA1C of 7.4%, cholesterol 269, triglycerides 101, HDL 49, LDL 200 (04/24/15) and A1C of 8.1% 12/26/14.  Weight hx: Lowest adult weight:  125 lbs in her 20's Highest weight 275 lbs about 10 years ago and lost by eating better and exercising.   Today's weight 232 lbs (decreased 2 lbs in the last 4 weeks.  Patient lives with her 20 yo grandson. She has been eating increased vegetables and fruits and decreased bread and sweets for the past month.  She does the shopping and cooking.  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   Preferred Learning Style:   No preference indicated   Learning Readiness:   Contemplating  Ready  Change in progress   MEDICATIONS: see list to include Metformin (she only takes the morning dose), Jardiance (she likes this and takes this daily), and Novolog 70/30.  She takes 20 units with breakfast and 30 units with dinner.  She has had increased sweating and feeling bad occasionally but never checked her blood sugar to see if it was low.  She would rest and the symptoms would resolve.  She is currently out of test strips.  She would prefer to get off of medication but discussed that goal needs to be to control her blood sugar and blood cholesterol.  She has not started her cholesterol medication yet.   DIETARY INTAKE:  Usual eating pattern includes 2-3 meals and 1-2 snacks per day.  24-hr recall:   B (10 AM): coffee , thin ham and cheese  Snk ( AM): [place] yogurt  L ( PM): often skips or has fruit because she is not hungry Snk ( PM): none or raw vegetables D ( PM): salad with ham or chicken baked or stewed and veges, occasionally fried Snk ( PM): none Beverages: water, coffee and occasional gingerale.  Usual physical activity: none "tired"  Estimated energy needs: 1500 calories 170 g carbohydrates 94 g protein 50 g fat  Progress Towards Goal(s):  In progress.   Nutritional Diagnosis:  NB-1.1 Food and nutrition-related knowledge deficit As related to balance of carbohydrate, protein, and fat.  As evidenced by diet hx.    Intervention:  Nutrition education and counseling related to type 2 diabetes and obesity.  Discussed insulin resistance and its implication on blood sugar control.  Discussed benefits of exercise.  Discussed portion size and carbohydrate balance.  Discussed tips to increase iron intake and have a more balanced diet.  Buy test strips if you have not. Avoid regular soda and other beverages with added sugar. Follow a low saturated fat diet.  (avoid animal fat, choose lean meat and take the skin off of the chicken, use only small amounts of cheese and choose reduced fat milk) Avoid added salt in cooking and at the table.  See the Low Sodium Nutrition Sheet. Avoid skipping meals. Try to have a consistent  meal schedule. Consider setting an alarm or have other reminder to help you remember to take your medication. See the Anemia nutrition therapy to help with your iron levels.  Aim for 2-3 Carb Choices per meal (30-45 grams) +/- 1 either way  Aim for 0-1 Carbs per snack if hungry  Include protein in moderation with your meals and snacks Consider reading food labels for Total Carbohydrate and Fat Grams of foods Consider  increasing your activity level by walking for 30 minutes daily as tolerated Consider checking BG at alternate times per day as directed by MD   Consider taking medication as directed by MD  Teaching Method Utilized:  Visual Auditory Hands on  Handouts given during visit include:  Meal plan card  Type 2 diabetes support group brochure  Label reading  Snack list  My plate  Low sodium nutrition therapy via mail  Anemia nutrition therapy via mail  Barriers to learning/adherence to lifestyle change: motivation and fatigue  Demonstrated degree of understanding via:  Teach Back   Monitoring/Evaluation:  Dietary intake, exercise, label reading, and body weight prn.

## 2015-06-26 NOTE — Patient Instructions (Signed)
Buy test strips if you have not. Avoid regular soda and other beverages with added sugar. Follow a low saturated fat diet.  (avoid animal fat, choose lean meat and take the skin off of the chicken, use only small amounts of cheese and choose reduced fat milk) Avoid added salt in cooking and at the table.  See the Low Sodium Nutrition Sheet. Avoid skipping meals. Try to have a consistent meal schedule. Consider setting an alarm or have other reminder to help you remember to take your medication. See the Anemia nutrition therapy to help with your iron levels.  Aim for 2-3 Carb Choices per meal (30-45 grams) +/- 1 either way  Aim for 0-1 Carbs per snack if hungry  Include protein in moderation with your meals and snacks Consider reading food labels for Total Carbohydrate and Fat Grams of foods Consider  increasing your activity level by walking for 30 minutes daily as tolerated Consider checking BG at alternate times per day as directed by MD  Consider taking medication as directed by MD

## 2015-07-28 ENCOUNTER — Other Ambulatory Visit: Payer: Self-pay | Admitting: Endocrinology

## 2015-08-05 ENCOUNTER — Other Ambulatory Visit (INDEPENDENT_AMBULATORY_CARE_PROVIDER_SITE_OTHER): Payer: Medicare Other

## 2015-08-05 DIAGNOSIS — E1165 Type 2 diabetes mellitus with hyperglycemia: Secondary | ICD-10-CM | POA: Diagnosis not present

## 2015-08-05 DIAGNOSIS — Z794 Long term (current) use of insulin: Secondary | ICD-10-CM

## 2015-08-07 ENCOUNTER — Other Ambulatory Visit: Payer: Self-pay

## 2015-08-07 ENCOUNTER — Ambulatory Visit (INDEPENDENT_AMBULATORY_CARE_PROVIDER_SITE_OTHER): Payer: Medicare Other | Admitting: Endocrinology

## 2015-08-07 ENCOUNTER — Encounter: Payer: Self-pay | Admitting: Endocrinology

## 2015-08-07 VITALS — BP 140/80 | HR 87 | Wt 230.0 lb

## 2015-08-07 DIAGNOSIS — Z794 Long term (current) use of insulin: Secondary | ICD-10-CM

## 2015-08-07 DIAGNOSIS — E1165 Type 2 diabetes mellitus with hyperglycemia: Secondary | ICD-10-CM | POA: Diagnosis not present

## 2015-08-07 DIAGNOSIS — E785 Hyperlipidemia, unspecified: Secondary | ICD-10-CM | POA: Diagnosis not present

## 2015-08-07 LAB — COMPREHENSIVE METABOLIC PANEL
ALT: 12 U/L (ref 0–35)
AST: 15 U/L (ref 0–37)
Albumin: 4 g/dL (ref 3.5–5.2)
Alkaline Phosphatase: 62 U/L (ref 39–117)
BUN: 18 mg/dL (ref 6–23)
CO2: 28 mEq/L (ref 19–32)
Calcium: 9.7 mg/dL (ref 8.4–10.5)
Chloride: 103 mEq/L (ref 96–112)
Creatinine, Ser: 1.06 mg/dL (ref 0.40–1.20)
GFR: 67.59 mL/min (ref >60.00)
Glucose, Bld: 110 mg/dL — ABNORMAL HIGH (ref 70–99)
Potassium: 4 mEq/L (ref 3.5–5.1)
Sodium: 140 mEq/L (ref 135–145)
Total Bilirubin: 0.4 mg/dL (ref 0.2–1.2)
Total Protein: 7.9 g/dL (ref 6.0–8.3)

## 2015-08-07 LAB — LIPID PANEL
Cholesterol: 233 mg/dL — ABNORMAL HIGH (ref 0–200)
HDL: 43.2 mg/dL (ref >39.00)
LDL Cholesterol: 164 mg/dL — ABNORMAL HIGH (ref 0–99)
NonHDL: 189.42
Total CHOL/HDL Ratio: 5
Triglycerides: 126 mg/dL (ref 0.0–149.0)
VLDL: 25.2 mg/dL (ref 0.0–40.0)

## 2015-08-07 LAB — HEMOGLOBIN A1C: Hgb A1c MFr Bld: 7.3 % — ABNORMAL HIGH (ref 4.6–6.5)

## 2015-08-07 MED ORDER — GLUCOSE BLOOD VI STRP: ORAL_STRIP | Status: DC

## 2015-08-07 NOTE — Progress Notes (Signed)
Patient ID: Stephanie [name], female   DOB: 12/05/1955, 60 y.o.   MRN: [id number]           Reason for Appointment: Followup of diabetes  Referring physician: Hulan Fess  History of Present Illness:          Diagnosis: Type 2 diabetes mellitus, date of diagnosis: 2003       Past history:  She was initially treated metformin at some point glimepiride also added and not clear what her level of control was in the first few years. Had been only taking 500 mg twice a day of metformin and Amaryl increased to 4 mg twice a day several years ago In the last few years her blood sugar control has been more difficult and she has been managed by an endocrinologist Because of poor control she had been tried on Janumet and Brea but she states that she could not tolerate them because of headaches Had poor control with Amaryl and metformin and also difficulty with weight gain she had started Trulicity in [id number] on her initial consultation Her weight on her initial visit was 240 pounds. Her A1c had gone down to 7.1% in [id number] with using Trulicity which she was tolerating at that time However in 2/16 she stopped taking Trulicity as it was causing nausea , subsequently did not want to go on Tanzeum Because of markedly increased sugars she has been on premixed insulin since 4/16  Recent history:   INSULIN DOSE: Novolog Mix 75/25, 20 before breakfast and 38 before supper   When blood sugars were poorly controlled in 12/16 she was started on Jardiance She has been reluctant to take basal bolus insulin regimen and only wants to take 1 kind of insulin  Last A1c is 7.3  Current history, problems and blood sugar readings:  She appears to have variable readings  On an average blood sugars are about the same at breakfast and bedtime, this is despite increasing her insulin on the last visit in the evening  However appears to have relatively better readings in the morning the last 5 days  She has only  lost a couple of pounds despite continuing Jardiance  She has started walking some as recommended  Diet can be more consistent although making some changes after seeing the dietitian last month  She is reluctant to consider better methods of insulin administration including the V-go pump, still wants to continue premixed insulin       Oral hypoglycemic drugs the patient is taking are: Metformin 1 g twice a day, Jardiance 25 mg daily     Side effects from medications have been: Januvia, Onglyza apparently caused headaches, Invokana: Candidiasis   Compliance with the medical regimen: Fair    Hypoglycemia: none  Glucose monitoring:  done 1-2 times a day         Glucometer: One Touch Ultra.      Blood Glucose readings by download   Mean values apply above for all meters except median for One Touch  PRE-MEAL Fasting Lunch Dinner Bedtime Overall  Glucose range: 108-213   138-195  124-317    Mean/median: 183  152  180  167     Glycemic control:   Lab Results  Component Value Date   HGBA1C 7.3* 08/07/2015   HGBA1C 7.4* 03/27/2015   HGBA1C 8.1* 12/26/2014   Lab Results  Component Value Date   MICROALBUR 14.2* 05/09/2014   LDLCALC 164* 08/07/2015   CREATININE 1.06 08/07/2015    Self-care:  The diet that the patient has been following is: tries to limit fats with most meals .      Meals: 2-3 meals per day. Breakfast is cereal or scrambled eggs. Lunch is half sandwich, yogurt and fruit; dinner usually baked chicken, bread and vegetables, snacks are fruits or nuts. Not eating out regularly, some fried foods Dinner can be 9-10 pm  Exercise: Walking up to 20 min           Dietician visit: Most recent: 06/2015 CDE visit: 12/15              Weight history: Previous range 200-260  Wt Readings from Last 3 Encounters:  08/07/15 230 lb (104.327 kg)  06/26/15 232 lb (105.235 kg)  05/27/15 234 lb 9.6 oz (106.414 kg)    Other active problems: discussed in review of  systems   LABS:  Lab on 08/05/2015  Component Date Value Ref Range Status  . Hgb A1c MFr Bld 08/07/2015 7.3* 4.6 - 6.5 % Final   Glycemic Control Guidelines for People with Diabetes:Non Diabetic:  <6%Goal of Therapy: <7%Additional Action Suggested:  >8%   . Sodium 08/07/2015 140  135 - 145 mEq/L Final  . Potassium 08/07/2015 4.0  3.5 - 5.1 mEq/L Final  . Chloride 08/07/2015 103  96 - 112 mEq/L Final  . CO2 08/07/2015 28  19 - 32 mEq/L Final  . Glucose, Bld 08/07/2015 110* 70 - 99 mg/dL Final  . BUN 08/07/2015 18  6 - 23 mg/dL Final  . Creatinine, Ser 08/07/2015 1.06  0.40 - 1.20 mg/dL Final  . Total Bilirubin 08/07/2015 0.4  0.2 - 1.2 mg/dL Final  . Alkaline Phosphatase 08/07/2015 62  39 - 117 U/L Final  . AST 08/07/2015 15  0 - 37 U/L Final  . ALT 08/07/2015 12  0 - 35 U/L Final  . Total Protein 08/07/2015 7.9  6.0 - 8.3 g/dL Final  . Albumin 08/07/2015 4.0  3.5 - 5.2 g/dL Final  . Calcium 08/07/2015 9.7  8.4 - 10.5 mg/dL Final  . GFR 08/07/2015 67.59  >60.00 mL/min Final  . Cholesterol 08/07/2015 233* 0 - 200 mg/dL Final   ATP III Classification       Desirable:  < 200 mg/dL               Borderline High:  200 - 239 mg/dL          High:  > = 240 mg/dL  . Triglycerides 08/07/2015 126.0  0.0 - 149.0 mg/dL Final   Normal:  <150 mg/dLBorderline High:  150 - 199 mg/dL  . HDL 08/07/2015 43.20  >39.00 mg/dL Final  . VLDL 08/07/2015 25.2  0.0 - 40.0 mg/dL Final  . LDL Cholesterol 08/07/2015 164* 0 - 99 mg/dL Final  . Total CHOL/HDL Ratio 08/07/2015 5   Final                  Men          Women1/2 Average Risk     3.4          3.3Average Risk          5.0          4.42X Average Risk          9.6          7.13X Average Risk          15.0          11.0                      .  NonHDL 08/07/2015 189.42   Final   NOTE:  Non-HDL goal should be 30 mg/dL higher than patient's LDL goal (i.e. LDL goal of < 70 mg/dL, would have non-HDL goal of < 100 mg/dL)       Medication List       This list  is accurate as of: 08/07/15 11:59 PM.  Always use your most recent med list.               empagliflozin 25 MG Tabs tablet  Commonly known as:  JARDIANCE  Take 25 mg by mouth daily.     furosemide 40 MG tablet  Commonly known as:  LASIX  TAKE ONE TABLET BY MOUTH ONCE DAILY     glucose blood test strip  Commonly known as:  ONE TOUCH ULTRA TEST  USE THREE TIMES DAILY TO CHECK BLOOD SUGAR     hydrocortisone valerate cream 0.2 %  Commonly known as:  WESTCORT     insulin aspart protamine - aspart (70-30) 100 UNIT/ML FlexPen  Commonly known as:  NOVOLOG MIX 70/30 FLEXPEN  Inject 20 units at breakfast and 35 units at dinner.     Insulin Pen Needle 32G X 4 MM Misc  Commonly known as:  BD PEN NEEDLE NANO U/F  Use 2 per day     BD PEN NEEDLE NANO U/F 32G X 4 MM Misc  Generic drug:  Insulin Pen Needle  USE TWO NEEDLES DAILY     metFORMIN 1000 MG tablet  Commonly known as:  GLUCOPHAGE  Take 1 tablet (1,000 mg total) by mouth 2 (two) times daily with a meal.     rosuvastatin 5 MG tablet  Commonly known as:  CRESTOR  Take 1 tablet (5 mg total) by mouth daily.     valsartan 80 MG tablet  Commonly known as:  DIOVAN  TAKE ONE TABLET BY MOUTH ONCE DAILY **TO  REPLACE LISINOPRIL**        Allergies:  Allergies  Allergen Reactions  . Avapro [Irbesartan] Other (See Comments)    headaches  . Codeine Nausea And Vomiting  . Lisinopril Itching    Past Medical History  Diagnosis Date  . Diabetes mellitus   . Asthma   . Hypertension   . Abnormal Pap smear   . LGSIL (low grade squamous intraepithelial dysplasia)   . Hyperlipidemia     Past Surgical History  Procedure Laterality Date  . Abdominal hysterectomy    . Bladder surgery    . Colposcopy      Family History  Problem Relation Age of Onset  . Diabetes Mother   . Diabetes Maternal Grandmother   . Heart disease Neg Hx   . Hypertension Neg Hx     Social History:  reports that she has never smoked. She has never  used smokeless tobacco. She reports that she does not drink alcohol or use illicit drugs.    Review of Systems   EDEMA: She Has been having significant edema, now Improving with taking 25 mg Jardiance and also half tablet Lasix  Hypertension: She has higher readings periodically but now with starting valsartan 80 mg her blood pressure appears to be better, she does not think she can tolerate lisinopril She has checked her blood pressure at home occasionally also   Lab Results  Component Value Date   CREATININE 1.06 08/07/2015   BUN 18 08/07/2015   NA 140 08/07/2015   K 4.0 08/07/2015   CL 103 08/07/2015   CO2 28  08/07/2015        Lipids: She had been on Lipitor, LDL has been previously high, around 180, this is managed by her PCP   She Is on Crestor 5 mg, takes hs regularly Since her last visit, previous LDL was 200      Lab Results  Component Value Date   CHOL 233* 08/07/2015   HDL 43.20 08/07/2015   LDLCALC 164* 08/07/2015   TRIG 126.0 08/07/2015   CHOLHDL 5 08/07/2015               She does have a history of mild Numbness but no tingling or burning in feet    Diabetic foot exam last done in [id number] by PCP  Complications from diabetes: Proliferative retinopathy, microalbuminuria   Physical Examination:  BP 140/80 mmHg  Pulse 87  Wt 230 lb (104.327 kg)  SpO2 94%       1+ ankle edema present.   ASSESSMENT:  Diabetes type 2, with obesity See history of present illness for detailed discussion of his current management, blood sugar patterns and problems identified  Her blood sugars have been overall somewhat better but A1c is still over 7%. She continues to have variable readings and mostly high readings in the morning and after supper but not consistently She may be trying to walk more regularly and blood sugars appear to be improving Discussed how to use the V-go pump but she agrees to consider this now She is however trying to improve her lifestyle and  appears to be somewhat more motivated   Hypertension: Blood pressure is better with adding valsartan  EDEMA, still not resolved,  improved with Lasix along with Jardiance 25 mg   HYPERLIPIDEMIA: She has taking Crestor more regularly and will need follow-up labs  PLAN:   1. No change in insulin as yet 2. Given new test strips prescription to check more often after meals, discussed blood sugar targets 3. Will consider basal bolus insulin regimen on the next visit, discussed briefly various options 4. May not need to check every morning 5. Lasix 40 mg daily for edema and continue valsartan Discussed A1c target of under 7    Patient Instructions  Take Lasix 40 mg daily  Check blood sugars on waking up  4-5 times a week Also check blood sugars about 2 hours after a meal and do this after different meals by rotation  Recommended blood sugar levels on waking up is 90-130 and about 2 hours after meal is 130-160  Please bring your blood sugar monitor to each visit, thank you    Counseling time on subjects discussed above is over 50% of today's 25 minute visit   Surena Welge 08/08/2015, 3:50 PM   Note: This office note was prepared with Dragon voice recognition system technology. Any transcriptional errors that result from this process are unintentional.   Addendum: LDL 164: Recommend either adding Zetia or increasing Crestor to 20 mg

## 2015-08-07 NOTE — Patient Instructions (Signed)
Take Lasix 40 mg daily  Check blood sugars on waking up  4-5 times a week Also check blood sugars about 2 hours after a meal and do this after different meals by rotation  Recommended blood sugar levels on waking up is 90-130 and about 2 hours after meal is 130-160  Please bring your blood sugar monitor to each visit, thank you

## 2015-08-18 ENCOUNTER — Other Ambulatory Visit: Payer: Self-pay | Admitting: Endocrinology

## 2015-08-31 ENCOUNTER — Other Ambulatory Visit: Payer: Self-pay | Admitting: Obstetrics and Gynecology

## 2015-08-31 DIAGNOSIS — Z1231 Encounter for screening mammogram for malignant neoplasm of breast: Secondary | ICD-10-CM

## 2015-09-11 ENCOUNTER — Ambulatory Visit
Admission: RE | Admit: 2015-09-11 | Discharge: 2015-09-11 | Disposition: A | Discharge status: Home / Self Care | Payer: Medicare Other | Source: Ambulatory Visit | Attending: Obstetrics and Gynecology | Admitting: Obstetrics and Gynecology

## 2015-09-11 DIAGNOSIS — Z1231 Encounter for screening mammogram for malignant neoplasm of breast: Secondary | ICD-10-CM | POA: Diagnosis not present

## 2015-09-12 ENCOUNTER — Other Ambulatory Visit: Payer: Self-pay | Admitting: Endocrinology

## 2015-09-26 ENCOUNTER — Other Ambulatory Visit: Payer: Self-pay | Admitting: Endocrinology

## 2015-09-29 ENCOUNTER — Telehealth: Payer: Self-pay | Admitting: Endocrinology

## 2015-09-29 ENCOUNTER — Other Ambulatory Visit: Payer: Self-pay | Admitting: *Deleted

## 2015-09-29 MED ORDER — METFORMIN HCL 1000 MG PO TABS: 1000.0000 mg | ORAL_TABLET | Freq: Two times a day (BID) | ORAL | 3 refills | Status: DC

## 2015-09-29 MED ORDER — EMPAGLIFLOZIN 25 MG PO TABS: 25.0000 mg | ORAL_TABLET | Freq: Every day | ORAL | 3 refills | Status: DC

## 2015-09-29 MED ORDER — GLUCOSE BLOOD VI STRP: ORAL_STRIP | 3 refills | Status: DC

## 2015-09-29 MED ORDER — INSULIN PEN NEEDLE 32G X 4 MM MISC: 3 refills | Status: DC

## 2015-09-29 NOTE — Telephone Encounter (Signed)
Rx's have been sent per patient request.

## 2015-09-29 NOTE — Telephone Encounter (Signed)
Patient need refill of medication.   BD PEN NEEDLE NANO U/F 32G X 4 MM MISC  Disp Refills Start End   glucose blood (ONE TOUCH ULTRA TEST) test strip        metFORMIN (GLUCOPHAGE) 1000 MG tablet empagliflozin (JARDIANCE) 25 MG TABS tablet [place], [place]. [phone] (Phone) [phone] (Fax)

## 2015-10-01 ENCOUNTER — Other Ambulatory Visit (INDEPENDENT_AMBULATORY_CARE_PROVIDER_SITE_OTHER): Payer: Medicare Other

## 2015-10-01 DIAGNOSIS — Z794 Long term (current) use of insulin: Secondary | ICD-10-CM | POA: Diagnosis not present

## 2015-10-01 DIAGNOSIS — E785 Hyperlipidemia, unspecified: Secondary | ICD-10-CM | POA: Diagnosis not present

## 2015-10-01 DIAGNOSIS — E1165 Type 2 diabetes mellitus with hyperglycemia: Secondary | ICD-10-CM

## 2015-10-01 LAB — BASIC METABOLIC PANEL
BUN: 31 mg/dL — ABNORMAL HIGH (ref 6–23)
CO2: 33 mEq/L — ABNORMAL HIGH (ref 19–32)
Calcium: 9.3 mg/dL (ref 8.4–10.5)
Chloride: 102 mEq/L (ref 96–112)
Creatinine, Ser: 1.2 mg/dL (ref 0.40–1.20)
GFR: 58.55 mL/min — ABNORMAL LOW (ref >60.00)
Glucose, Bld: 138 mg/dL — ABNORMAL HIGH (ref 70–99)
Potassium: 4.1 mEq/L (ref 3.5–5.1)
Sodium: 139 mEq/L (ref 135–145)

## 2015-10-01 LAB — LDL CHOLESTEROL, DIRECT: Direct LDL: 183 mg/dL

## 2015-10-01 LAB — MICROALBUMIN / CREATININE URINE RATIO
Creatinine,U: 87.5 mg/dL
Microalb Creat Ratio: 36.9 mg/g — ABNORMAL HIGH (ref 0.0–30.0)
Microalb, Ur: 32.3 mg/dL — ABNORMAL HIGH (ref 0.0–1.9)

## 2015-10-01 NOTE — Telephone Encounter (Signed)
Rx's have been sent on 09/05

## 2015-10-05 ENCOUNTER — Ambulatory Visit: Payer: Medicare Other | Admitting: Endocrinology

## 2015-10-05 LAB — FRUCTOSAMINE: Fructosamine: 267 umol/L (ref 190–270)

## 2015-10-06 ENCOUNTER — Ambulatory Visit: Payer: Medicare Other | Admitting: Endocrinology

## 2015-10-09 ENCOUNTER — Ambulatory Visit (INDEPENDENT_AMBULATORY_CARE_PROVIDER_SITE_OTHER): Payer: Medicare Other | Admitting: Endocrinology

## 2015-10-09 VITALS — BP 148/70 | HR 92 | Temp 98.1°F | Resp 16 | Ht 64.0 in | Wt 223.0 lb

## 2015-10-09 DIAGNOSIS — Z794 Long term (current) use of insulin: Secondary | ICD-10-CM

## 2015-10-09 DIAGNOSIS — E1165 Type 2 diabetes mellitus with hyperglycemia: Secondary | ICD-10-CM

## 2015-10-09 DIAGNOSIS — E118 Type 2 diabetes mellitus with unspecified complications: Secondary | ICD-10-CM | POA: Diagnosis not present

## 2015-10-09 DIAGNOSIS — IMO0002 Reserved for concepts with insufficient information to code with codable children: Secondary | ICD-10-CM

## 2015-10-09 MED ORDER — ATORVASTATIN CALCIUM 20 MG PO TABS: 20.0000 mg | ORAL_TABLET | Freq: Every day | ORAL | 3 refills | Status: DC

## 2015-10-09 NOTE — Patient Instructions (Addendum)
Check blood sugars on waking up  3 x   Also check blood sugars about 2 hours after a meal and do this after different meals by rotation  Recommended blood sugar levels on waking up is 90-130 and about 2 hours after meal is 130-160  Please bring your blood sugar monitor to each visit, thank you  Walk 20 min daily

## 2015-10-09 NOTE — Progress Notes (Signed)
Patient ID: Stephanie [name], female   DOB: 05-01-1955, 60 y.o.   MRN: [id number]           Reason for Appointment: Followup of diabetes  Referring physician: Hulan Fess  History of Present Illness:          Diagnosis: Type 2 diabetes mellitus, date of diagnosis: 2003       Past history:  She was initially treated metformin at some point glimepiride also added and not clear what her level of control was in the first few years. Had been only taking 500 mg twice a day of metformin and Amaryl increased to 4 mg twice a day several years ago In the last few years her blood sugar control has been more difficult and she has been managed by an endocrinologist Because of poor control she had been tried on Janumet and [place] but she states that she could not tolerate them because of headaches Had poor control with Amaryl and metformin and also difficulty with weight gain she had started Trulicity in [id number] on her initial consultation Her weight on her initial visit was 240 pounds. Her A1c had gone down to 7.1% in 44/03 with using Trulicity which she was tolerating at that time However in 2/16 she stopped taking Trulicity as it was causing nausea , subsequently did not want to go on Tanzeum Because of markedly increased sugars she has been on premixed insulin since 4/16  Recent history:   INSULIN DOSE: Novolog Mix 75/25, 20 before breakfast and 38 before supper   When blood sugars were poorly controlled in 12/16 she was started on Jardiance She has been reluctant to take basal bolus insulin regimen and only wants to take 1 kind of insulin  Last A1c is 7.3 and not fructosamine is looking good eye 267  Current history, problems and blood sugar readings:  She appears to have much higher  Blood sugars than expected from her home readings fructosamine and lab glucose  However she is checking blood sugars primarily fasting and some before supper  Lab glucose was 138 late morning   variable  readings  On an average blood sugars are slightly better in the evenings but not clear what her sugars are after supper  She has discussed meal planning with the dietitian but does not always follow recommendations  Has stopped walking even though she was starting to do this on her last visit, does not appear motivated       Oral hypoglycemic drugs the patient is taking are: Metformin 1 g twice a day, Jardiance 25 mg daily     Side effects from medications have been: Januvia, Onglyza apparently caused headaches, Invokana: Candidiasis   Compliance with the medical regimen: Fair    Hypoglycemia: none  Glucose monitoring:  done 1 times a day         Glucometer: One Touch Ultra.      Blood Glucose readings by download   Mean values apply above for all meters except median for One Touch  PRE-MEAL Fasting Lunch Dinner Bedtime Overall  Glucose range: 139-354   148-266     Mean/median: 235   210   221     Glycemic control:   Lab Results  Component Value Date   HGBA1C 7.3 (H) 08/07/2015   HGBA1C 7.4 (H) 03/27/2015   HGBA1C 8.1 (H) 12/26/2014   Lab Results  Component Value Date   MICROALBUR 32.3 (H) 10/01/2015   LDLCALC 164 (H) 08/07/2015   CREATININE  1.20 10/01/2015    Self-care: The diet that the patient has been following is: tries to limit fats with most meals .      Meals: 2-3 meals per day. Breakfast is cereal or scrambled eggs. Lunch is half sandwich, yogurt and fruit; dinner usually baked chicken, bread and vegetables, snacks are fruits or nuts. Not eating out regularly, some fried foods Dinner can be 9-10 pm  Exercise: Walking less         Dietician visit: Most recent: 06/2015 CDE visit: 12/15              Weight history: Previous range 200-260  Wt Readings from Last 3 Encounters:  10/12/15 223 lb (101.2 kg)  08/07/15 230 lb (104.3 kg)  06/26/15 232 lb (105.2 kg)    Other active problems: discussed in review of systems   LABS:  No visits with results within  1 Week(s) from this visit.  Latest known visit with results is:  Lab on 10/01/2015  Component Date Value Ref Range Status  . Microalb, Ur 10/01/2015 32.3* 0.0 - 1.9 mg/dL Final  . Creatinine,U 10/01/2015 87.5  mg/dL Final  . Microalb Creat Ratio 10/01/2015 36.9* 0.0 - 30.0 mg/g Final  . Sodium 10/01/2015 139  135 - 145 mEq/L Final  . Potassium 10/01/2015 4.1  3.5 - 5.1 mEq/L Final  . Chloride 10/01/2015 102  96 - 112 mEq/L Final  . CO2 10/01/2015 33* 19 - 32 mEq/L Final  . Glucose, Bld 10/01/2015 138* 70 - 99 mg/dL Final  . BUN 10/01/2015 31* 6 - 23 mg/dL Final  . Creatinine, Ser 10/01/2015 1.20  0.40 - 1.20 mg/dL Final  . Calcium 10/01/2015 9.3  8.4 - 10.5 mg/dL Final  . GFR 10/01/2015 58.55* >60.00 mL/min Final  . Fructosamine 10/05/2015 267  190 - 270 umol/L Final  . Direct LDL 10/01/2015 183.0  mg/dL Final       Medication List       Accurate as of 10/09/15 11:59 PM. Always use your most recent med list.          atorvastatin 20 MG tablet Commonly known as:  LIPITOR Take 1 tablet (20 mg total) by mouth daily.   empagliflozin 25 MG Tabs tablet Commonly known as:  JARDIANCE Take 25 mg by mouth daily.   furosemide 40 MG tablet Commonly known as:  LASIX TAKE ONE TABLET BY MOUTH ONCE DAILY   glucose blood test strip Commonly known as:  ONE TOUCH ULTRA TEST USE THREE TIMES DAILY TO CHECK BLOOD SUGAR   hydrocortisone valerate cream 0.2 % Commonly known as:  WESTCORT   insulin aspart protamine - aspart (70-30) 100 UNIT/ML FlexPen Commonly known as:  NOVOLOG MIX 70/30 FLEXPEN INJECT 20 UNITS SUBCUTANEOUSLY  WITH BREAKFAST AND  35  UNITS  AT  DINNER.   insulin aspart protamine - aspart (70-30) 100 UNIT/ML FlexPen Commonly known as:  NOVOLOG MIX 70/30 FLEXPEN INJECT 20 UNITS SUBCUTANEOUSLY  WITH BREAKFAST AND  35  UNITS  AT  DINNER.   Insulin Pen Needle 32G X 4 MM Misc Commonly known as:  BD PEN NEEDLE NANO U/F Use 2 per day   Insulin Pen Needle 32G X 4 MM  Misc Commonly known as:  BD PEN NEEDLE NANO U/F USE TWO NEEDLES DAILY   metFORMIN 1000 MG tablet Commonly known as:  GLUCOPHAGE Take 1 tablet (1,000 mg total) by mouth 2 (two) times daily with a meal.   valsartan 80 MG tablet Commonly known as:  DIOVAN TAKE ONE TABLET BY MOUTH ONCE DAILY **TO  REPLACE LISINOPRIL**       Allergies:  Allergies  Allergen Reactions  . Avapro [Irbesartan] Other (See Comments)    headaches  . Codeine Nausea And Vomiting  . Lisinopril Itching    Past Medical History:  Diagnosis Date  . Abnormal Pap smear   . Asthma   . Diabetes mellitus   . Hyperlipidemia   . Hypertension   . LGSIL (low grade squamous intraepithelial dysplasia)     Past Surgical History:  Procedure Laterality Date  . ABDOMINAL HYSTERECTOMY    . bladder surgery    . COLPOSCOPY      Family History  Problem Relation Age of Onset  . Diabetes Mother   . Diabetes Maternal Grandmother   . Heart disease Neg Hx   . Hypertension Neg Hx     Social History:  reports that she has never smoked. She has never used smokeless tobacco. She reports that she does not drink alcohol or use drugs.    Review of Systems   EDEMA: She Has been having significant edema, now Improving with taking 25 mg Jardiance and also 40 mg Lasix  Hypertension: She has higher readings periodically but now with  valsartan 80 mg her blood pressure appears to be better, she does not think she can tolerate lisinopril She has checked her blood pressure at home occasionally also   Lab Results  Component Value Date   CREATININE 1.20 10/01/2015   BUN 31 (H) 10/01/2015   NA 139 10/01/2015   K 4.1 10/01/2015   CL 102 10/01/2015   CO2 33 (H) 10/01/2015        Lipids: She had been on Lipitor, LDL has been previously high, around 180, this is managed by her PCP  However has not followed up with PCP She now says that she does not take Crestor because of itching and has not discussed this before Lipids are  uncontrolled as expected       Lab Results  Component Value Date   CHOL 233 (H) 08/07/2015   HDL 43.20 08/07/2015   LDLCALC 164 (H) 08/07/2015   LDLDIRECT 183.0 10/01/2015   TRIG 126.0 08/07/2015   CHOLHDL 5 08/07/2015               She does have a history of mild Numbness but no tingling or burning in feet    Diabetic foot exam last done in 9/47 by PCP  Complications from diabetes: Proliferative retinopathy, microalbuminuria   Physical Examination:  BP (!) 148/70   Pulse 92   Temp 98.1 F (36.7 C)   Resp 16   Ht 5\' 4"  (1.626 m)   Wt 223 lb (101.2 kg)   SpO2 97%   BMI 38.28 kg/m        2 + ankle edema present, minimal on lower legs.   ASSESSMENT:  Diabetes type 2, with obesity See history of present illness for detailed discussion of his current management, blood sugar patterns and problems identified  Her blood sugars have been overall somewhat better but A1c is still over 7%. She continues to have variable readings and mostly high readings in the morning and after supper but not consistently She may be trying to walk more regularly and blood sugars appear to be improving Discussed how to use the V-go pump but she agrees to consider this now She is however trying to improve her lifestyle and appears to be somewhat more motivated  Hypertension: Blood pressure is better with  valsartan  EDEMA, still not resolved,  improved and may be partly related to venous insufficiency   HYPERLIPIDEMIA: She has not been taking Crestor even though she said she was doing this previously and will need to switch to another drug.  Discussed lipid targets especially with diabetes and she agrees to try Lipitor at least every other day to start with and then daily  PLAN for diabetes:   1. No change in insulin as yet 2. Given new Verio glucose monitor and she will compare this to her own monitor 3. Needs to check readings more consistently including after meals 4. Will consider  adding an insulin dose to lunchtime and suppertime readings are consistently high 5. Start walking or doing other and do exercises that were suggested today 6. May also consider V-go pump although she is reluctant to consider this today 7. Discussed blood sugar targets before and after meals    Patient Instructions  Check blood sugars on waking up  3 x   Also check blood sugars about 2 hours after a meal and do this after different meals by rotation  Recommended blood sugar levels on waking up is 90-130 and about 2 hours after meal is 130-160  Please bring your blood sugar monitor to each visit, thank you  Walk 20 min daily    Counseling time on subjects discussed above is over 50% of today's 25 minute visit   Stephanie [name] 10/12/2015, 8:34 AM   Note: This office note was prepared with Dragon voice recognition system technology. Any transcriptional errors that result from this process are unintentional.   Addendum: LDL 164: Recommend either adding Zetia or increasing Crestor to 20 mg

## 2015-10-12 ENCOUNTER — Encounter: Payer: Self-pay | Admitting: Endocrinology

## 2015-10-12 MED ORDER — INSULIN ASPART PROT & ASPART (70-30 MIX) 100 UNIT/ML PEN
PEN_INJECTOR | SUBCUTANEOUS | 3 refills | Status: DC
Start: 2015-10-12 — End: 2016-12-07

## 2015-10-12 MED ORDER — INSULIN ASPART PROT & ASPART (70-30 MIX) 100 UNIT/ML PEN: PEN_INJECTOR | SUBCUTANEOUS | 3 refills | Status: DC

## 2015-10-13 ENCOUNTER — Other Ambulatory Visit: Payer: Self-pay | Admitting: Endocrinology

## 2015-11-05 ENCOUNTER — Ambulatory Visit (INDEPENDENT_AMBULATORY_CARE_PROVIDER_SITE_OTHER): Payer: Medicare Other | Admitting: Ophthalmology

## 2015-11-05 DIAGNOSIS — I10 Essential (primary) hypertension: Secondary | ICD-10-CM

## 2015-11-05 DIAGNOSIS — E113393 Type 2 diabetes mellitus with moderate nonproliferative diabetic retinopathy without macular edema, bilateral: Secondary | ICD-10-CM

## 2015-11-05 DIAGNOSIS — H35033 Hypertensive retinopathy, bilateral: Secondary | ICD-10-CM

## 2015-11-05 DIAGNOSIS — E11311 Type 2 diabetes mellitus with unspecified diabetic retinopathy with macular edema: Secondary | ICD-10-CM | POA: Diagnosis not present

## 2015-11-05 DIAGNOSIS — H43813 Vitreous degeneration, bilateral: Secondary | ICD-10-CM

## 2015-11-12 ENCOUNTER — Telehealth: Payer: Self-pay | Admitting: Endocrinology

## 2015-11-12 NOTE — Telephone Encounter (Signed)
Pt called and requests a refill of her Jardience be sent to the [facility] on [address].

## 2015-11-13 ENCOUNTER — Other Ambulatory Visit: Payer: Self-pay | Admitting: *Deleted

## 2015-11-13 MED ORDER — EMPAGLIFLOZIN 25 MG PO TABS: 25.0000 mg | ORAL_TABLET | Freq: Every day | ORAL | 3 refills | Status: DC

## 2015-11-13 NOTE — Telephone Encounter (Signed)
Rx sent 

## 2015-11-23 ENCOUNTER — Other Ambulatory Visit: Payer: Medicare Other

## 2015-11-26 ENCOUNTER — Ambulatory Visit: Payer: Medicare Other | Admitting: Endocrinology

## 2015-11-27 ENCOUNTER — Encounter (INDEPENDENT_AMBULATORY_CARE_PROVIDER_SITE_OTHER): Payer: Medicare Other | Admitting: Ophthalmology

## 2015-11-27 DIAGNOSIS — H35033 Hypertensive retinopathy, bilateral: Secondary | ICD-10-CM

## 2015-11-27 DIAGNOSIS — E11311 Type 2 diabetes mellitus with unspecified diabetic retinopathy with macular edema: Secondary | ICD-10-CM

## 2015-11-27 DIAGNOSIS — I10 Essential (primary) hypertension: Secondary | ICD-10-CM | POA: Diagnosis not present

## 2015-11-27 DIAGNOSIS — H43813 Vitreous degeneration, bilateral: Secondary | ICD-10-CM | POA: Diagnosis not present

## 2015-11-27 DIAGNOSIS — E113313 Type 2 diabetes mellitus with moderate nonproliferative diabetic retinopathy with macular edema, bilateral: Secondary | ICD-10-CM

## 2015-12-08 ENCOUNTER — Other Ambulatory Visit (INDEPENDENT_AMBULATORY_CARE_PROVIDER_SITE_OTHER): Payer: Medicare Other

## 2015-12-08 DIAGNOSIS — Z794 Long term (current) use of insulin: Secondary | ICD-10-CM

## 2015-12-08 DIAGNOSIS — IMO0002 Reserved for concepts with insufficient information to code with codable children: Secondary | ICD-10-CM

## 2015-12-08 DIAGNOSIS — E118 Type 2 diabetes mellitus with unspecified complications: Secondary | ICD-10-CM | POA: Diagnosis not present

## 2015-12-08 DIAGNOSIS — E1165 Type 2 diabetes mellitus with hyperglycemia: Secondary | ICD-10-CM | POA: Diagnosis not present

## 2015-12-08 LAB — COMPREHENSIVE METABOLIC PANEL
ALT: 11 U/L (ref 0–35)
AST: 12 U/L (ref 0–37)
Albumin: 4 g/dL (ref 3.5–5.2)
Alkaline Phosphatase: 72 U/L (ref 39–117)
BUN: 26 mg/dL — ABNORMAL HIGH (ref 6–23)
CO2: 30 mEq/L (ref 19–32)
Calcium: 9.6 mg/dL (ref 8.4–10.5)
Chloride: 103 mEq/L (ref 96–112)
Creatinine, Ser: 1.16 mg/dL (ref 0.40–1.20)
GFR: 60.84 mL/min (ref >60.00)
Glucose, Bld: 103 mg/dL — ABNORMAL HIGH (ref 70–99)
Potassium: 3.8 mEq/L (ref 3.5–5.1)
Sodium: 141 mEq/L (ref 135–145)
Total Bilirubin: 0.4 mg/dL (ref 0.2–1.2)
Total Protein: 8 g/dL (ref 6.0–8.3)

## 2015-12-08 LAB — HEMOGLOBIN A1C: Hgb A1c MFr Bld: 8.1 % — ABNORMAL HIGH (ref 4.6–6.5)

## 2015-12-08 LAB — LIPID PANEL
Cholesterol: 185 mg/dL (ref 0–200)
HDL: 47.2 mg/dL (ref >39.00)
LDL Cholesterol: 117 mg/dL — ABNORMAL HIGH (ref 0–99)
NonHDL: 137.35
Total CHOL/HDL Ratio: 4
Triglycerides: 103 mg/dL (ref 0.0–149.0)
VLDL: 20.6 mg/dL (ref 0.0–40.0)

## 2015-12-11 ENCOUNTER — Ambulatory Visit (INDEPENDENT_AMBULATORY_CARE_PROVIDER_SITE_OTHER): Payer: Medicare Other | Admitting: Endocrinology

## 2015-12-11 ENCOUNTER — Encounter: Payer: Self-pay | Admitting: Endocrinology

## 2015-12-11 VITALS — BP 142/84 | Wt 231.0 lb

## 2015-12-11 DIAGNOSIS — I10 Essential (primary) hypertension: Secondary | ICD-10-CM

## 2015-12-11 DIAGNOSIS — E1165 Type 2 diabetes mellitus with hyperglycemia: Secondary | ICD-10-CM

## 2015-12-11 DIAGNOSIS — Z794 Long term (current) use of insulin: Secondary | ICD-10-CM

## 2015-12-11 DIAGNOSIS — Z23 Encounter for immunization: Secondary | ICD-10-CM

## 2015-12-11 NOTE — Patient Instructions (Addendum)
Insulin 20 units am and 44 units at supper  Check blood sugars on waking up  4x weekly  Also check blood sugars about 2 hours after a meal and do this after different meals by rotation  Recommended blood sugar levels on waking up is 90-130 and about 2 hours after meal is 130-160  Please bring your blood sugar monitor to each visit, thank you  Watch diet, exercise  ON THE DAY WHEN starting pump don't take shot in am  Check Blood pressure weekly

## 2015-12-11 NOTE — Progress Notes (Signed)
Patient ID: Stephanie [name], female   DOB: 19-Jan-1956, 60 y.o.   MRN: [id number]           Reason for Appointment: Followup of diabetes  Referring physician: Hulan Fess  History of Present Illness:          Diagnosis: Type 2 diabetes mellitus, date of diagnosis: 2003       Past history:  She was initially treated metformin at some point glimepiride also added and not clear what her level of control was in the first few years. Had been only taking 500 mg twice a day of metformin and Amaryl increased to 4 mg twice a day several years ago In the last few years her blood sugar control has been more difficult and she has been managed by an endocrinologist Because of poor control she had been tried on Janumet and [place] but she states that she could not tolerate them because of headaches Had poor control with Amaryl and metformin and also difficulty with weight gain she had started Trulicity in 32/2025 on her initial consultation Her weight on her initial visit was 240 pounds. Her A1c had gone down to 7.1% in 42/70 with using Trulicity which she was tolerating at that time However in 2/16 she stopped taking Trulicity as it was causing nausea , subsequently did not want to go on Tanzeum Because of markedly increased sugars she has been on premixed insulin since 4/16  Recent history:   INSULIN DOSE: Novolog Mix 75/25, 20 before breakfast and 38 before supper   When blood sugars were poorly controlled in 12/16 she was started on Jardiance  Last A1c is 8.1, previously 7.3  Current history, problems and blood sugar readings:  She appears to have relatively higher blood sugars since her last visit  However she did not bring her monitor today and does not remember her readings  Appears to have relatively high fasting readings, lab glucose was nearly normal at 4 PM  She thinks her blood sugar was 197 last night after supper but not clear if she is checking these much and she does not  believe they are over 200  When her last visit she had refused to consider the V-go pump  She is only exercising sporadically and still not motivated  Her weight has gone up: She thinks she is going off her diet and eating his dinner sweets are higher fat meals at times       Oral hypoglycemic drugs the patient is taking are: Metformin 1 g twice a day, Jardiance 25 mg daily     Side effects from medications have been: Januvia, Onglyza apparently caused headaches, Invokana: Candidiasis   Compliance with the medical regimen: Fair    Hypoglycemia: none  Glucose monitoring:  done 1 times a day         Glucometer: One Touch Ultra.      Blood Glucose readings by recall:  Mean values apply above for all meters except median for One Touch  PRE-MEAL Fasting Lunch Dinner Bedtime Overall  Glucose range: 200  ? 197   Mean/median:        Glycemic control:   Lab Results  Component Value Date   HGBA1C 8.1 (H) 12/08/2015   HGBA1C 7.3 (H) 08/07/2015   HGBA1C 7.4 (H) 03/27/2015   Lab Results  Component Value Date   MICROALBUR 32.3 (H) 10/01/2015   LDLCALC 117 (H) 12/08/2015   CREATININE 1.16 12/08/2015    Self-care: The diet that the  patient has been following is: tries to limit fats with most meals .      Meals: 2-3 meals per day. Breakfast is cereal or scrambled eggs.  Lunch is half sandwich, yogurt and fruit; dinner usually baked chicken, bread and vegetables, snacks are fruits or nuts. Not eating out regularly, some fried foods Dinner can be 9-10 pm  Exercise: Walking less Recently        Dietician visit: Most recent: 06/2015 CDE visit: 12/15              Weight history: Previous range 200-260  Wt Readings from Last 3 Encounters:  12/11/15 231 lb (104.8 kg)  10/12/15 223 lb (101.2 kg)  08/07/15 230 lb (104.3 kg)    Other active problems: discussed in review of systems   LABS:  Lab on 12/08/2015  Component Date Value Ref Range Status  . Hgb A1c MFr Bld 12/08/2015 8.1*  4.6 - 6.5 % Final  . Cholesterol 12/08/2015 185  0 - 200 mg/dL Final  . Triglycerides 12/08/2015 103.0  0.0 - 149.0 mg/dL Final  . HDL 12/08/2015 47.20  >39.00 mg/dL Final  . VLDL 12/08/2015 20.6  0.0 - 40.0 mg/dL Final  . LDL Cholesterol 12/08/2015 117* 0 - 99 mg/dL Final  . Total CHOL/HDL Ratio 12/08/2015 4   Final  . NonHDL 12/08/2015 137.35   Final  . Sodium 12/08/2015 141  135 - 145 mEq/L Final  . Potassium 12/08/2015 3.8  3.5 - 5.1 mEq/L Final  . Chloride 12/08/2015 103  96 - 112 mEq/L Final  . CO2 12/08/2015 30  19 - 32 mEq/L Final  . Glucose, Bld 12/08/2015 103* 70 - 99 mg/dL Final  . BUN 12/08/2015 26* 6 - 23 mg/dL Final  . Creatinine, Ser 12/08/2015 1.16  0.40 - 1.20 mg/dL Final  . Total Bilirubin 12/08/2015 0.4  0.2 - 1.2 mg/dL Final  . Alkaline Phosphatase 12/08/2015 72  39 - 117 U/L Final  . AST 12/08/2015 12  0 - 37 U/L Final  . ALT 12/08/2015 11  0 - 35 U/L Final  . Total Protein 12/08/2015 8.0  6.0 - 8.3 g/dL Final  . Albumin 12/08/2015 4.0  3.5 - 5.2 g/dL Final  . Calcium 12/08/2015 9.6  8.4 - 10.5 mg/dL Final  . GFR 12/08/2015 60.84  >60.00 mL/min Final       Medication List       Accurate as of 12/11/15  2:36 PM. Always use your most recent med list.          atorvastatin 20 MG tablet Commonly known as:  LIPITOR Take 1 tablet (20 mg total) by mouth daily.   empagliflozin 25 MG Tabs tablet Commonly known as:  JARDIANCE Take 25 mg by mouth daily.   furosemide 40 MG tablet Commonly known as:  LASIX TAKE ONE TABLET BY MOUTH ONCE DAILY   glucose blood test strip Commonly known as:  ONE TOUCH ULTRA TEST USE THREE TIMES DAILY TO CHECK BLOOD SUGAR   hydrocortisone valerate cream 0.2 % Commonly known as:  WESTCORT   insulin aspart protamine - aspart (70-30) 100 UNIT/ML FlexPen Commonly known as:  NOVOLOG MIX 70/30 FLEXPEN INJECT 20 UNITS SUBCUTANEOUSLY  WITH BREAKFAST AND  35  UNITS  AT  DINNER.   Insulin Pen Needle 32G X 4 MM Misc Commonly known  as:  BD PEN NEEDLE NANO U/F Use 2 per day   metFORMIN 1000 MG tablet Commonly known as:  GLUCOPHAGE Take 1 tablet (1,000 mg  total) by mouth 2 (two) times daily with a meal.   valsartan 80 MG tablet Commonly known as:  DIOVAN TAKE ONE TABLET BY MOUTH ONCE DAILY **TO  REPLACE  LISINOPRIL**       Allergies:  Allergies  Allergen Reactions  . Avapro [Irbesartan] Other (See Comments)    headaches  . Codeine Nausea And Vomiting  . Lisinopril Itching    Past Medical History:  Diagnosis Date  . Abnormal Pap smear   . Asthma   . Diabetes mellitus   . Hyperlipidemia   . Hypertension   . LGSIL (low grade squamous intraepithelial dysplasia)     Past Surgical History:  Procedure Laterality Date  . ABDOMINAL HYSTERECTOMY    . bladder surgery    . COLPOSCOPY      Family History  Problem Relation Age of Onset  . Diabetes Mother   . Diabetes Maternal Grandmother   . Heart disease Neg Hx   . Hypertension Neg Hx     Social History:  reports that she has never smoked. She has never used smokeless tobacco. She reports that she does not drink alcohol or use drugs.    Review of Systems   EDEMA: She Has been having significant edema, now Relatively better but still present even with taking 25 mg Jardiance and also 40 mg Lasix  Hypertension: She has higher readings periodically and it is still high normal today, she does not think she had her medication today  She has checked her blood pressure at home, not recently   Lab Results  Component Value Date   CREATININE 1.16 12/08/2015   BUN 26 (H) 12/08/2015   NA 141 12/08/2015   K 3.8 12/08/2015   CL 103 12/08/2015   CO2 30 12/08/2015        Lipids: She had been on LipitorAnd this was restarted on her last visit when she claimed that she was itching because of Crestor  LDL has been previously high, around 180 and now it is below 120 She is reluctant to increase her doses, she thinks her diet has been poor lately          Lab Results  Component Value Date   CHOL 185 12/08/2015   HDL 47.20 12/08/2015   LDLCALC 117 (H) 12/08/2015   LDLDIRECT 183.0 10/01/2015   TRIG 103.0 12/08/2015   CHOLHDL 4 12/08/2015               She does have a history of mild Numbness but no tingling or burning in feet    Diabetic foot exam last done in 1/02 by PCP  Complications from diabetes: Proliferative retinopathy, microalbuminuria   Physical Examination:  BP (!) 142/84 (Cuff Size: Normal)   Wt 231 lb (104.8 kg)   SpO2 93%   BMI 39.65 kg/m        2 + ankle edema present    ASSESSMENT:  Diabetes type 2, with obesity See history of present illness for detailed discussion of his current management, blood sugar patterns and problems identified  Her blood sugars have been overall difficult to control and A1c has gone up to over 8% She has not been doing well 1 her diet and has gained weight Also not motivated to exercise much Unable to review her monitor and not clear what her blood sugar patterns are However has significantly high fasting readings and relatively lower afternoon readings from above assessment  Appears to be needing more basal insulin and given her options  of taking basal bolus with 4 injections a day or the V-go pump This was discussed in detail and she finally agreed to consider the V-go pump as a trial Discussed A1c targets She is however needing to improve her lifestyle with better diet and regular exercise   Hypertension: Blood pressure is better with  valsartan, high normal today with missing her medication  EDEMA, still not resolved,  improved and may be partly related to venous insufficiency   HYPERLIPIDEMIA: She has improved control with restarting Lipitor LDL is still not at target with 20 mg and may do better with consistent diet   PLAN for diabetes:    Increase evening insulin to 44 units  Start checking blood sugars more consistently at various times including after  meals  She will start the V-go pump after instructions from nurse educator  Check blood pressure at home periodically    Patient Instructions  Insulin 20 units am and 44 units at supper  Check blood sugars on waking up  4x weekly  Also check blood sugars about 2 hours after a meal and do this after different meals by rotation  Recommended blood sugar levels on waking up is 90-130 and about 2 hours after meal is 130-160  Please bring your blood sugar monitor to each visit, thank you  Watch diet, exercise  ON THE DAY WHEN starting pump don't take shot in am  Check Blood pressure weekly          Counseling time on subjects discussed above is over 50% of today's 25 minute visit   Seriyah Collison 12/11/2015, 2:36 PM   Note: This office note was prepared with Dragon voice recognition system technology. Any transcriptional errors that result from this process are unintentional.   Addendum: LDL 164: Recommend either adding Zetia or increasing Crestor to 20 mg

## 2015-12-28 ENCOUNTER — Encounter (INDEPENDENT_AMBULATORY_CARE_PROVIDER_SITE_OTHER): Payer: Medicare Other | Admitting: Ophthalmology

## 2016-01-05 ENCOUNTER — Other Ambulatory Visit: Payer: Self-pay | Admitting: Endocrinology

## 2016-01-06 ENCOUNTER — Encounter: Payer: Medicare Other | Admitting: Nutrition

## 2016-01-07 ENCOUNTER — Encounter (INDEPENDENT_AMBULATORY_CARE_PROVIDER_SITE_OTHER): Payer: Medicare Other | Admitting: Ophthalmology

## 2016-01-07 DIAGNOSIS — E11311 Type 2 diabetes mellitus with unspecified diabetic retinopathy with macular edema: Secondary | ICD-10-CM | POA: Diagnosis not present

## 2016-01-07 DIAGNOSIS — H2513 Age-related nuclear cataract, bilateral: Secondary | ICD-10-CM | POA: Diagnosis not present

## 2016-01-07 DIAGNOSIS — E113313 Type 2 diabetes mellitus with moderate nonproliferative diabetic retinopathy with macular edema, bilateral: Secondary | ICD-10-CM | POA: Diagnosis not present

## 2016-01-07 DIAGNOSIS — H35033 Hypertensive retinopathy, bilateral: Secondary | ICD-10-CM | POA: Diagnosis not present

## 2016-01-07 DIAGNOSIS — I10 Essential (primary) hypertension: Secondary | ICD-10-CM

## 2016-01-07 DIAGNOSIS — H43813 Vitreous degeneration, bilateral: Secondary | ICD-10-CM | POA: Diagnosis not present

## 2016-01-08 ENCOUNTER — Ambulatory Visit: Payer: Medicare Other | Admitting: Endocrinology

## 2016-01-11 ENCOUNTER — Other Ambulatory Visit: Payer: Self-pay

## 2016-01-13 ENCOUNTER — Telehealth: Payer: Self-pay | Admitting: Endocrinology

## 2016-01-13 NOTE — Telephone Encounter (Signed)
This has been prescribed by PCP, please let her know

## 2016-01-13 NOTE — Telephone Encounter (Signed)
See message and please advise if ok to refill. Rx is listed under a historical provider at this time.

## 2016-01-13 NOTE — Telephone Encounter (Signed)
I contacted the patient and advised of message. Patient voiced understanding and stated she would call her PCP.

## 2016-01-13 NOTE — Telephone Encounter (Signed)
Patient need refill of medication hydrocortisone valerate cream (WESTCORT) 0.2 %  [place], [place]. [phone] (Phone) [phone] (Fax)

## 2016-01-14 DIAGNOSIS — L309 Dermatitis, unspecified: Secondary | ICD-10-CM | POA: Diagnosis not present

## 2016-01-14 DIAGNOSIS — I10 Essential (primary) hypertension: Secondary | ICD-10-CM | POA: Diagnosis not present

## 2016-02-04 ENCOUNTER — Encounter (INDEPENDENT_AMBULATORY_CARE_PROVIDER_SITE_OTHER): Payer: Medicare Other | Admitting: Ophthalmology

## 2016-02-04 DIAGNOSIS — I10 Essential (primary) hypertension: Secondary | ICD-10-CM | POA: Diagnosis not present

## 2016-02-04 DIAGNOSIS — E113313 Type 2 diabetes mellitus with moderate nonproliferative diabetic retinopathy with macular edema, bilateral: Secondary | ICD-10-CM | POA: Diagnosis not present

## 2016-02-04 DIAGNOSIS — H2513 Age-related nuclear cataract, bilateral: Secondary | ICD-10-CM | POA: Diagnosis not present

## 2016-02-04 DIAGNOSIS — E11311 Type 2 diabetes mellitus with unspecified diabetic retinopathy with macular edema: Secondary | ICD-10-CM

## 2016-02-04 DIAGNOSIS — H35033 Hypertensive retinopathy, bilateral: Secondary | ICD-10-CM | POA: Diagnosis not present

## 2016-02-04 DIAGNOSIS — H43813 Vitreous degeneration, bilateral: Secondary | ICD-10-CM | POA: Diagnosis not present

## 2016-03-03 ENCOUNTER — Encounter (INDEPENDENT_AMBULATORY_CARE_PROVIDER_SITE_OTHER): Payer: Medicare Other | Admitting: Ophthalmology

## 2016-03-28 ENCOUNTER — Other Ambulatory Visit: Payer: Self-pay

## 2016-03-28 MED ORDER — ATORVASTATIN CALCIUM 20 MG PO TABS: 20.0000 mg | ORAL_TABLET | Freq: Every day | ORAL | 3 refills | Status: DC

## 2016-03-28 MED ORDER — VALSARTAN 80 MG PO TABS: ORAL_TABLET | ORAL | 3 refills | Status: DC

## 2016-03-31 ENCOUNTER — Telehealth: Payer: Self-pay | Admitting: Endocrinology

## 2016-03-31 ENCOUNTER — Other Ambulatory Visit: Payer: Self-pay

## 2016-03-31 MED ORDER — FUROSEMIDE 40 MG PO TABS: 40.0000 mg | ORAL_TABLET | Freq: Every day | ORAL | 1 refills | Status: DC

## 2016-03-31 NOTE — Telephone Encounter (Signed)
Patient need refill of medication Lasix.  Didn't state which pharmacy  Called the [facility]

## 2016-03-31 NOTE — Telephone Encounter (Signed)
Ordered

## 2016-06-14 ENCOUNTER — Other Ambulatory Visit: Payer: Self-pay | Admitting: Endocrinology

## 2016-07-07 ENCOUNTER — Encounter: Payer: Self-pay | Admitting: Endocrinology

## 2016-07-07 ENCOUNTER — Ambulatory Visit (INDEPENDENT_AMBULATORY_CARE_PROVIDER_SITE_OTHER): Payer: Medicare Other | Admitting: Endocrinology

## 2016-07-07 VITALS — BP 152/90 | HR 79 | Ht 64.0 in | Wt 227.0 lb

## 2016-07-07 DIAGNOSIS — E1165 Type 2 diabetes mellitus with hyperglycemia: Secondary | ICD-10-CM | POA: Diagnosis not present

## 2016-07-07 DIAGNOSIS — I10 Essential (primary) hypertension: Secondary | ICD-10-CM

## 2016-07-07 DIAGNOSIS — Z794 Long term (current) use of insulin: Secondary | ICD-10-CM | POA: Diagnosis not present

## 2016-07-07 DIAGNOSIS — E782 Mixed hyperlipidemia: Secondary | ICD-10-CM

## 2016-07-07 LAB — COMPREHENSIVE METABOLIC PANEL
ALT: 13 U/L (ref 0–35)
AST: 12 U/L (ref 0–37)
Albumin: 4.1 g/dL (ref 3.5–5.2)
Alkaline Phosphatase: 69 U/L (ref 39–117)
BUN: 25 mg/dL — ABNORMAL HIGH (ref 6–23)
CO2: 31 mEq/L (ref 19–32)
Calcium: 10 mg/dL (ref 8.4–10.5)
Chloride: 100 mEq/L (ref 96–112)
Creatinine, Ser: 1.11 mg/dL (ref 0.40–1.20)
GFR: 63.9 mL/min (ref >60.00)
Glucose, Bld: 145 mg/dL — ABNORMAL HIGH (ref 70–99)
Potassium: 3.8 mEq/L (ref 3.5–5.1)
Sodium: 139 mEq/L (ref 135–145)
Total Bilirubin: 0.4 mg/dL (ref 0.2–1.2)
Total Protein: 7.9 g/dL (ref 6.0–8.3)

## 2016-07-07 LAB — LIPID PANEL
Cholesterol: 184 mg/dL (ref 0–200)
HDL: 46.9 mg/dL (ref >39.00)
LDL Cholesterol: 114 mg/dL — ABNORMAL HIGH (ref 0–99)
NonHDL: 137.02
Total CHOL/HDL Ratio: 4
Triglycerides: 115 mg/dL (ref 0.0–149.0)
VLDL: 23 mg/dL (ref 0.0–40.0)

## 2016-07-07 LAB — GLUCOSE, POCT (MANUAL RESULT ENTRY): POC Glucose: 161 mg/dl — AB (ref 70–99)

## 2016-07-07 LAB — POCT GLYCOSYLATED HEMOGLOBIN (HGB A1C): Hemoglobin A1C: 7.5

## 2016-07-07 MED ORDER — INSULIN DETEMIR 100 UNIT/ML FLEXPEN: 12.0000 [IU] | PEN_INJECTOR | Freq: Every day | SUBCUTANEOUS | 1 refills | Status: DC

## 2016-07-07 NOTE — Progress Notes (Signed)
Patient ID: Stephanie [name], female   DOB: 1955/02/03, 61 y.o.   MRN: [id number]           Reason for Appointment: Followup of diabetes and hypertension  Referring physician: Hulan Fess  History of Present Illness:          Diagnosis: Type 2 diabetes mellitus, date of diagnosis: 2003       Past history:  She was initially treated metformin at some point glimepiride also added and not clear what her level of control was in the first few years. Had been only taking 500 mg twice a day of metformin and Amaryl increased to 4 mg twice a day several years ago In the last few years her blood sugar control has been more difficult and she has been managed by an endocrinologist Because of poor control she had been tried on Janumet and [place] but she states that she could not tolerate them because of headaches Had poor control with Amaryl and metformin and also difficulty with weight gain she had started Trulicity in 77/1165 on her initial consultation Her weight on her initial visit was 240 pounds. Her A1c had gone down to 7.1% in 79/03 with using Trulicity which she was tolerating at that time However in 2/16 she stopped taking Trulicity as it was causing nausea , subsequently did not want to go on Tanzeum Because of markedly increased sugars she has been on premixed insulin since 4/16  Recent history:   INSULIN DOSE: Novolog Mix 75/25, 20 before breakfast and 44 before supper   When blood sugars were poorly controlled in 12/16 she was started on Jardiance She has not been seen in follow-up since November   A1c is now 7.5, previously 8.1  Current history, problems and blood sugar readings:  She was told to increase her suppertime insulin on her last visit  She did not bring her monitor again today and not clear how often she is monitoring especially after meals  She does report that her sugars are still about the same at around 200 in the morning although was 166 today in the office  late morning without breakfast  She has lost about 4 pounds and maybe doing a little better on her diet compared to her last time visit when she was getting more sweets for higher fat meals  However she has not done much exercise  She thinks her blood sugars are fairly reasonable in the evenings before and after supper, again in the past she has not checked readings after meals much at all  She thinks she is taking her oral medications including Jardiance consistently       Oral hypoglycemic drugs the patient is taking are: Metformin 1 g twice a day, Jardiance 25 mg daily     Side effects from medications have been: Januvia, Onglyza apparently caused headaches, Invokana: Candidiasis   Compliance with the medical regimen: Fair    Hypoglycemia: none  Glucose monitoring:  done 1 times a day         Glucometer: One Touch Ultra.      Blood Glucose readings by recall:  Mean values apply above for all meters except median for One Touch  PRE-MEAL Fasting Lunch Dinner Bedtime Overall  Glucose range: 200  ? 160 150   Mean/median:        Glycemic control:   Lab Results  Component Value Date   HGBA1C 7.5 07/07/2016   HGBA1C 8.1 (H) 12/08/2015   HGBA1C 7.3 (H)  08/07/2015   Lab Results  Component Value Date   MICROALBUR 32.3 (H) 10/01/2015   LDLCALC 117 (H) 12/08/2015   CREATININE 1.16 12/08/2015    Self-care: The diet that the patient has been following is: tries to limit fats with most meals .      Meals: 2-3 meals per day. Breakfast is cereal or scrambled eggs.  Lunch is half sandwich, yogurt and fruit; dinner usually baked chicken, bread and vegetables, snacks are fruits or nuts. Not eating out regularly, some fried foods Dinner; 9-10 pm  Exercise: Walking Occasionally        Dietician visit: Most recent: 06/2015 CDE visit: 12/15              Weight history: Previous range 200-260  Wt Readings from Last 3 Encounters:  07/07/16 227 lb (103 kg)  12/11/15 231 lb (104.8 kg)    10/12/15 223 lb (101.2 kg)    Other active problems: discussed in review of systems   LABS:  Office Visit on 07/07/2016  Component Date Value Ref Range Status  . Hemoglobin A1C 07/07/2016 7.5   Final  . POC Glucose 07/07/2016 161* 70 - 99 mg/dl Final     Allergies as of 07/07/2016      Reactions   Avapro [irbesartan] Other (See Comments)   headaches   Codeine Nausea And Vomiting   Lisinopril Itching      Medication List       Accurate as of 07/07/16  1:39 PM. Always use your most recent med list.          atorvastatin 20 MG tablet Commonly known as:  LIPITOR Take 1 tablet (20 mg total) by mouth daily.   empagliflozin 25 MG Tabs tablet Commonly known as:  JARDIANCE Take 25 mg by mouth daily.   furosemide 40 MG tablet Commonly known as:  LASIX Take 1 tablet (40 mg total) by mouth daily.   glucose blood test strip Commonly known as:  ONE TOUCH ULTRA TEST USE THREE TIMES DAILY TO CHECK BLOOD SUGAR   hydrocortisone valerate cream 0.2 % Commonly known as:  WESTCORT   insulin aspart protamine - aspart (70-30) 100 UNIT/ML FlexPen Commonly known as:  NOVOLOG MIX 70/30 FLEXPEN INJECT 20 UNITS SUBCUTANEOUSLY  WITH BREAKFAST AND  35  UNITS  AT  DINNER.   Insulin Detemir 100 UNIT/ML Pen Commonly known as:  LEVEMIR FLEXTOUCH Inject 12 Units into the skin daily at 10 pm.   Insulin Pen Needle 32G X 4 MM Misc Commonly known as:  BD PEN NEEDLE NANO U/F Use 2 per day   metFORMIN 1000 MG tablet Commonly known as:  GLUCOPHAGE Take 1 tablet (1,000 mg total) by mouth 2 (two) times daily with a meal.   valsartan 80 MG tablet Commonly known as:  DIOVAN TAKE ONE TABLET BY MOUTH ONCE DAILY **TO  REPLACE  LISINOPRIL**       Allergies:  Allergies  Allergen Reactions  . Avapro [Irbesartan] Other (See Comments)    headaches  . Codeine Nausea And Vomiting  . Lisinopril Itching    Past Medical History:  Diagnosis Date  . Abnormal Pap smear   . Asthma   . Diabetes  mellitus   . Hyperlipidemia   . Hypertension   . LGSIL (low grade squamous intraepithelial dysplasia)     Past Surgical History:  Procedure Laterality Date  . ABDOMINAL HYSTERECTOMY    . bladder surgery    . COLPOSCOPY      Family History  Problem Relation Age of Onset  . Diabetes Mother   . Diabetes Maternal Grandmother   . Heart disease Neg Hx   . Hypertension Neg Hx     Social History:  reports that she has never smoked. She has never used smokeless tobacco. She reports that she does not drink alcohol or use drugs.    Review of Systems   EDEMA: She Has been having significant edema, now  taking 25 mg Jardiance and also 40 mg Lasix  Hypertension:    She is taking Diovan 80 mg  She has checked her blood pressure at home, 1 systolic was 500   Lab Results  Component Value Date   CREATININE 1.16 12/08/2015   BUN 26 (H) 12/08/2015   NA 141 12/08/2015   K 3.8 12/08/2015   CL 103 12/08/2015   CO2 30 12/08/2015        Lipids:  Previously claimed that she was itching because of Crestor  LDL has been previously high, around 180 and She is on Lipitor 20 mg daily She does need follow-up, previously LDL was higher because of not watching her diet       Lab Results  Component Value Date   CHOL 185 12/08/2015   HDL 47.20 12/08/2015   LDLCALC 117 (H) 12/08/2015   LDLDIRECT 183.0 10/01/2015   TRIG 103.0 12/08/2015   CHOLHDL 4 12/08/2015               Has a history of mild Numbness but no tingling or burning in feet    Diabetic foot exam last done in 9/38 by PCP  Complications from diabetes: Proliferative retinopathy, microalbuminuria   Physical Examination:  BP (!) 152/90   Pulse 79   Ht 5\' 4"  (1.626 m)   Wt 227 lb (103 kg)   SpO2 97%   BMI 38.96 kg/m    Repeat blood pressure 138/76    1 + ankle edema present    ASSESSMENT:  Diabetes type 2, with obesity See history of present illness for detailed discussion of his current management, blood sugar  patterns and problems identified  Her blood sugars have been overall difficult to control and A1c still over 7% She appears to have high fasting readings mostly Has difficulty losing weight despite continuing Jardiance  Appears to be needing more basal insulin  Since her sugars are relatively better at bedtime according to her history will not increase her current insulin She says she does not mind taking injections   Hypertension: Blood pressure is better with  valsartan, high normal today with missing her medication  EDEMA, Improved   HYPERLIPIDEMIA: She has been on 20 mg Lipitor and will need to check her labs again, discussed that we may need to increase her dose or possibly add another drug  PLAN for diabetes:    Start checking blood sugars consistently at various times including after meals  Start Levemir insulin to improve fasting readings.  Discussed how basal insulin works, timing of insulin, use of the Flex pen, duration of action and the need to titrate this based on fasting readings  She was explained how to use a flowsheet to bring her fasting readings to target with starting at 6 units and increasing by 2 units every 3 days until blood sugars under 130  She will also need to bring her monitor for download  She needs to start walking regularly  Again discussed that if her sugars are not consistently controlled we may still need  to consider the V-go pump which she is currently refusing  Check blood pressure at home     There are no Patient Instructions on file for this visit.  Counseling time on subjects discussed above is over 50% of today's 25 minute visit   Cardarius Senat 07/07/2016, 1:39 PM   Note: This office note was prepared with Estate agent. Any transcriptional errors that result from this process are unintentional.

## 2016-07-11 ENCOUNTER — Other Ambulatory Visit: Payer: Self-pay

## 2016-07-11 ENCOUNTER — Telehealth: Payer: Self-pay | Admitting: Endocrinology

## 2016-07-11 ENCOUNTER — Other Ambulatory Visit: Payer: Self-pay | Admitting: Endocrinology

## 2016-07-11 MED ORDER — ATORVASTATIN CALCIUM 40 MG PO TABS: 40.0000 mg | ORAL_TABLET | Freq: Every day | ORAL | 1 refills | Status: DC

## 2016-07-11 NOTE — Telephone Encounter (Signed)
Called patient and left a voice message to let her know that her refill was sent in today for her Vania Rea to the [facility] on [address].

## 2016-07-11 NOTE — Telephone Encounter (Signed)
**  Remind patient they can make refill requests via MyChart**  Medication refill request (Name & Dosage): empagliflozin (JARDIANCE) 25 MG TABS tablet Bedtime insulin (new rx, patient was not sure of name)   Preferred pharmacy (Name & Address):  [place], [place]. [phone] (Phone) [phone] (Fax)      Other comments (if applicable):   Notify patient once rx has been placed.

## 2016-07-11 NOTE — Progress Notes (Signed)
Please call to let patient know that the cholesterol this time is about the same, still too high and we will need to increase her Generic Lipitor up to 40 mg, she can take 2 of the 20 mean while

## 2016-07-13 ENCOUNTER — Other Ambulatory Visit: Payer: Self-pay | Admitting: Endocrinology

## 2016-07-28 ENCOUNTER — Other Ambulatory Visit: Payer: Self-pay | Admitting: Obstetrics and Gynecology

## 2016-07-28 DIAGNOSIS — Z1231 Encounter for screening mammogram for malignant neoplasm of breast: Secondary | ICD-10-CM

## 2016-08-14 ENCOUNTER — Other Ambulatory Visit: Payer: Self-pay | Admitting: Endocrinology

## 2016-08-15 ENCOUNTER — Telehealth: Payer: Self-pay | Admitting: *Deleted

## 2016-08-15 NOTE — Telephone Encounter (Signed)
Please review. Team health care notes. Thanks!

## 2016-08-15 NOTE — Telephone Encounter (Signed)
The note is for canceling the appointment, please forward to the appropriate person for rescheduling

## 2016-08-15 NOTE — Telephone Encounter (Signed)
PLEASE NOTE: All timestamps contained within this report are represented as [place] Standard Time. CONFIDENTIALTY NOTICE: This fax transmission is intended only for the addressee. It contains information that is legally privileged, confidential or otherwise protected from use or disclosure. If you are not the intended recipient, you are strictly prohibited from reviewing, disclosing, copying using or disseminating any of this information or taking any action in reliance on or regarding this information. If you have received this fax in error, please notify us immediately by telephone so that we can arrange for its return to [place]. Phone: [phone], Toll-Free: [phone], Fax: [phone] Page: 1 of 1 Call Id: [id number] [name] Telephone Record [name] Client Site [name] Night Contact Type Call Who Is Calling Patient / Member / Family / Caregiver Caller Name Stephanie [name] Phone Number [phone] Patient Name Stephanie [name] Call Type Message Only Information Provided Reason for Call Request to [facility] Appointment Initial Comment Caller states she is needing to cancel her appt that is Tues 7/24. Additional Comment Call Closed By: Kelby Aline Transaction Date/Time: 08/14/2016 12:45:11 PM (ET)

## 2016-08-16 ENCOUNTER — Other Ambulatory Visit: Payer: Medicare Other

## 2016-08-16 ENCOUNTER — Other Ambulatory Visit: Payer: Self-pay

## 2016-08-16 MED ORDER — OLMESARTAN MEDOXOMIL 20 MG PO TABS: 20.0000 mg | ORAL_TABLET | Freq: Every day | ORAL | 3 refills | Status: DC

## 2016-08-18 ENCOUNTER — Ambulatory Visit: Payer: Medicare Other | Admitting: Endocrinology

## 2016-08-18 DIAGNOSIS — Z0289 Encounter for other administrative examinations: Secondary | ICD-10-CM

## 2016-08-18 NOTE — Telephone Encounter (Signed)
Please reschedule labs and follow-up

## 2016-08-22 ENCOUNTER — Telehealth: Payer: Self-pay

## 2016-08-22 NOTE — Telephone Encounter (Signed)
Spoke with patient and informed her that olmesartan is not covered on her insurance and she would need to call insurance company and find out what is covered that is equivalent to this medication- patient stated an understanding and agreed to call me back when she knows what is covered

## 2016-08-23 ENCOUNTER — Telehealth: Payer: Self-pay | Admitting: Endocrinology

## 2016-08-23 NOTE — Telephone Encounter (Signed)
We can send another prescription but she needs to clarify if she is coming back, no appointment scheduled for labs or office visit

## 2016-08-23 NOTE — Telephone Encounter (Signed)
Patient is on valsartan (DIOVAN) 80 MG tablet and needs an alternative due to it being discontinued. Call patient to advise.

## 2016-08-24 NOTE — Telephone Encounter (Signed)
This has been resolved

## 2016-08-26 NOTE — Telephone Encounter (Signed)
Spoke with the pt to get her scheduled, she was not currently at home and needed to call us back to schedule

## 2016-08-31 ENCOUNTER — Other Ambulatory Visit (INDEPENDENT_AMBULATORY_CARE_PROVIDER_SITE_OTHER): Payer: Medicare Other

## 2016-08-31 ENCOUNTER — Telehealth: Payer: Self-pay | Admitting: Endocrinology

## 2016-08-31 DIAGNOSIS — Z794 Long term (current) use of insulin: Secondary | ICD-10-CM

## 2016-08-31 DIAGNOSIS — E1165 Type 2 diabetes mellitus with hyperglycemia: Secondary | ICD-10-CM

## 2016-08-31 LAB — BASIC METABOLIC PANEL
BUN: 19 mg/dL (ref 6–23)
CO2: 32 mEq/L (ref 19–32)
Calcium: 9.5 mg/dL (ref 8.4–10.5)
Chloride: 104 mEq/L (ref 96–112)
Creatinine, Ser: 1.06 mg/dL (ref 0.40–1.20)
GFR: 67.35 mL/min (ref >60.00)
Glucose, Bld: 129 mg/dL — ABNORMAL HIGH (ref 70–99)
Potassium: 3.9 mEq/L (ref 3.5–5.1)
Sodium: 140 mEq/L (ref 135–145)

## 2016-08-31 NOTE — Telephone Encounter (Signed)
Pt states her previos BP med was recalled and she needs a new one right away that is covered with her ins. Pt request call in to [facility] on [address].

## 2016-09-01 ENCOUNTER — Other Ambulatory Visit: Payer: Self-pay

## 2016-09-01 LAB — FRUCTOSAMINE: Fructosamine: 307 umol/L — ABNORMAL HIGH (ref 0–285)

## 2016-09-01 MED ORDER — CANDESARTAN CILEXETIL 16 MG PO TABS: 16.0000 mg | ORAL_TABLET | Freq: Every day | ORAL | 0 refills | Status: DC

## 2016-09-01 NOTE — Telephone Encounter (Signed)
Please send candesartan 16 mg daily.  However first confirm with her that she is coming back to see me

## 2016-09-01 NOTE — Telephone Encounter (Signed)
Called patient and she had labs done yesterday and I let her know that I do not see her follow up visit but she stated that she will call back to make her follow up appointment as soon as she can.

## 2016-09-01 NOTE — Telephone Encounter (Signed)
Irbesartan is listed on her allergy medication list. Please advise.

## 2016-09-01 NOTE — Telephone Encounter (Signed)
I had requested a prescription to be sent for irbesartan 150 mg daily on the fax message.  However she needs to schedule a follow-up appointment first

## 2016-09-01 NOTE — Telephone Encounter (Signed)
Please advise 

## 2016-09-14 ENCOUNTER — Ambulatory Visit: Payer: Medicare Other

## 2016-09-23 ENCOUNTER — Other Ambulatory Visit: Payer: Self-pay | Admitting: Endocrinology

## 2016-10-03 ENCOUNTER — Other Ambulatory Visit: Payer: Self-pay | Admitting: Endocrinology

## 2016-10-10 ENCOUNTER — Other Ambulatory Visit: Payer: Self-pay | Admitting: Endocrinology

## 2016-10-10 DIAGNOSIS — Z23 Encounter for immunization: Secondary | ICD-10-CM | POA: Diagnosis not present

## 2016-11-06 ENCOUNTER — Other Ambulatory Visit: Payer: Self-pay | Admitting: Endocrinology

## 2016-11-17 ENCOUNTER — Other Ambulatory Visit: Payer: Self-pay | Admitting: Endocrinology

## 2016-11-24 ENCOUNTER — Telehealth: Payer: Self-pay | Admitting: Endocrinology

## 2016-11-24 NOTE — Telephone Encounter (Signed)
Spoke with pt to get her scheduled and she asked to call me back

## 2016-11-24 NOTE — Telephone Encounter (Signed)
-----   Message from Elayne Snare, MD sent at 11/14/2016  9:28 AM EDT ----- Regarding: No appointments Please let her know that if she is not able to come regularly for follow-up we will have to dismiss her

## 2016-12-04 ENCOUNTER — Other Ambulatory Visit: Payer: Self-pay | Admitting: Endocrinology

## 2016-12-05 ENCOUNTER — Other Ambulatory Visit: Payer: Self-pay | Admitting: Endocrinology

## 2016-12-05 ENCOUNTER — Other Ambulatory Visit (INDEPENDENT_AMBULATORY_CARE_PROVIDER_SITE_OTHER): Payer: Medicare Other

## 2016-12-05 DIAGNOSIS — Z794 Long term (current) use of insulin: Secondary | ICD-10-CM | POA: Diagnosis not present

## 2016-12-05 DIAGNOSIS — E1165 Type 2 diabetes mellitus with hyperglycemia: Secondary | ICD-10-CM | POA: Diagnosis not present

## 2016-12-05 LAB — COMPREHENSIVE METABOLIC PANEL
ALT: 15 U/L (ref 0–35)
AST: 12 U/L (ref 0–37)
Albumin: 3.6 g/dL (ref 3.5–5.2)
Alkaline Phosphatase: 64 U/L (ref 39–117)
BUN: 26 mg/dL — ABNORMAL HIGH (ref 6–23)
CO2: 28 mEq/L (ref 19–32)
Calcium: 9.2 mg/dL (ref 8.4–10.5)
Chloride: 104 mEq/L (ref 96–112)
Creatinine, Ser: 1 mg/dL (ref 0.40–1.20)
GFR: 71.98 mL/min (ref >60.00)
Glucose, Bld: 213 mg/dL — ABNORMAL HIGH (ref 70–99)
Potassium: 4 mEq/L (ref 3.5–5.1)
Sodium: 140 mEq/L (ref 135–145)
Total Bilirubin: 0.4 mg/dL (ref 0.2–1.2)
Total Protein: 7.3 g/dL (ref 6.0–8.3)

## 2016-12-05 LAB — LIPID PANEL
Cholesterol: 144 mg/dL (ref 0–200)
HDL: 34.2 mg/dL — ABNORMAL LOW (ref >39.00)
NonHDL: 109.37
Total CHOL/HDL Ratio: 4
Triglycerides: 239 mg/dL — ABNORMAL HIGH (ref 0.0–149.0)
VLDL: 47.8 mg/dL — ABNORMAL HIGH (ref 0.0–40.0)

## 2016-12-05 LAB — MICROALBUMIN / CREATININE URINE RATIO
Creatinine,U: 65.3 mg/dL
Microalb Creat Ratio: 35.5 mg/g — ABNORMAL HIGH (ref 0.0–30.0)
Microalb, Ur: 23.2 mg/dL — ABNORMAL HIGH (ref 0.0–1.9)

## 2016-12-05 LAB — HEMOGLOBIN A1C: Hgb A1c MFr Bld: 7.4 % — ABNORMAL HIGH (ref 4.6–6.5)

## 2016-12-05 LAB — LDL CHOLESTEROL, DIRECT: Direct LDL: 89 mg/dL

## 2016-12-06 LAB — URINALYSIS, ROUTINE W REFLEX MICROSCOPIC
Bilirubin Urine: NEGATIVE
Hgb urine dipstick: NEGATIVE
Ketones, ur: NEGATIVE
Leukocytes, UA: NEGATIVE
Nitrite: NEGATIVE
RBC / HPF: NONE SEEN (ref 0–?)
Specific Gravity, Urine: 1.015 (ref 1.000–1.030)
Total Protein, Urine: 30 — AB
Urine Glucose: >=1000 — AB
Urobilinogen, UA: 1 (ref 0.0–1.0)
WBC, UA: NONE SEEN (ref 0–?)
pH: 6 (ref 5.0–8.0)

## 2016-12-07 ENCOUNTER — Other Ambulatory Visit: Payer: Self-pay

## 2016-12-07 ENCOUNTER — Ambulatory Visit (INDEPENDENT_AMBULATORY_CARE_PROVIDER_SITE_OTHER): Payer: Medicare Other | Admitting: Endocrinology

## 2016-12-07 ENCOUNTER — Encounter: Payer: Self-pay | Admitting: Endocrinology

## 2016-12-07 VITALS — BP 144/92 | HR 92 | Ht 64.0 in | Wt 223.0 lb

## 2016-12-07 DIAGNOSIS — E782 Mixed hyperlipidemia: Secondary | ICD-10-CM

## 2016-12-07 DIAGNOSIS — E1165 Type 2 diabetes mellitus with hyperglycemia: Secondary | ICD-10-CM

## 2016-12-07 DIAGNOSIS — Z794 Long term (current) use of insulin: Secondary | ICD-10-CM | POA: Diagnosis not present

## 2016-12-07 DIAGNOSIS — I10 Essential (primary) hypertension: Secondary | ICD-10-CM

## 2016-12-07 MED ORDER — METFORMIN HCL 1000 MG PO TABS: 1000.0000 mg | ORAL_TABLET | Freq: Two times a day (BID) | ORAL | 3 refills | Status: DC

## 2016-12-07 MED ORDER — ATORVASTATIN CALCIUM 40 MG PO TABS: 40.0000 mg | ORAL_TABLET | Freq: Every day | ORAL | 1 refills | Status: DC

## 2016-12-07 MED ORDER — GLUCOSE BLOOD VI STRP: ORAL_STRIP | 3 refills | Status: DC

## 2016-12-07 MED ORDER — INSULIN ASPART PROT & ASPART (70-30 MIX) 100 UNIT/ML PEN: PEN_INJECTOR | SUBCUTANEOUS | 3 refills | Status: DC

## 2016-12-07 MED ORDER — CANDESARTAN CILEXETIL 16 MG PO TABS: 16.0000 mg | ORAL_TABLET | Freq: Every day | ORAL | 0 refills | Status: DC

## 2016-12-07 MED ORDER — FUROSEMIDE 40 MG PO TABS: 40.0000 mg | ORAL_TABLET | Freq: Every day | ORAL | 1 refills | Status: DC

## 2016-12-07 MED ORDER — EMPAGLIFLOZIN 25 MG PO TABS: 25.0000 mg | ORAL_TABLET | Freq: Every day | ORAL | 3 refills | Status: DC

## 2016-12-07 MED ORDER — INSULIN PEN NEEDLE 32G X 4 MM MISC: 2 refills | Status: DC

## 2016-12-07 NOTE — Patient Instructions (Addendum)
Take 8 units before lunch if eating   Check blood sugars on waking up    Also check blood sugars about 2 hours after a meal and do this after different meals by rotation  Recommended blood sugar levels on waking up is 90-130 and about 2 hours after meal is 130-160  Please bring your blood sugar monitor to each visit, thank you  Walk daily

## 2016-12-07 NOTE — Progress Notes (Signed)
Patient ID: Stephanie [name], female   DOB: February 12, 1955, 61 y.o.   MRN: [id number]           Reason for Appointment: Followup of diabetes and hypertension  Referring physician: Hulan Fess  History of Present Illness:          Diagnosis: Type 2 diabetes mellitus, date of diagnosis: 2003       Past history:  She was initially treated metformin at some point glimepiride also added and not clear what her level of control was in the first few years. Had been only taking 500 mg twice a day of metformin and Amaryl increased to 4 mg twice a day several years ago In the last few years her blood sugar control has been more difficult and she has been managed by an endocrinologist Because of poor control she had been tried on Janumet and Homer but she states that she could not tolerate them because of headaches Had poor control with Amaryl and metformin and also difficulty with weight gain she had started Trulicity in 15/7262 on her initial consultation Her weight on her initial visit was 240 pounds. Her A1c had gone down to 7.1% in 03/55 with using Trulicity which she was tolerating at that time However in 2/16 she stopped taking Trulicity as it was causing nausea , subsequently did not want to go on Tanzeum Because of markedly increased sugars she has been on premixed insulin since 4/16  Recent history:   INSULIN DOSE: Novolog Mix 75/25, 20 before breakfast and 44 before supper irreg  When blood sugars were poorly controlled in 12/16 she was started on Jardiance She has not been seen in follow-up since 6/18    A1c is now 7.4 stable since last visit  Current history, problems and blood sugar readings:  She did bring her meter which she had not on the last visit and this was reviewed  However checking blood sugars mostly before her first meal which can be at variable times  Her morning sugars are showing significant variability in the last month; she does admit that sometimes she will  forget to take her evening insulin before eating even though she thinks she is taking her insulin pen with her when she is not at home  She was recommended adding Levemir insulin in the evening to help her fasting hyperglycemia but she did not do so  Also reluctant to take more than one type of insulin anyway  Also not clear with her lack of her afternoon blood sugar monitoring of her blood sugars are higher after her breakfast or lunch  She is not always eating 3 meals a day  She has only a couple of readings after evening meals which are both high  She has not done much exercise but she thinks that she is interested in weight loss  She is cutting back on desserts and is able to lose a little weight  Also continues to take Jardiance without side effects       Oral hypoglycemic drugs the patient is taking are: Metformin 1 g twice a day, Jardiance 25 mg daily     Side effects from medications have been: Januvia, Onglyza apparently caused headaches, Invokana: Candidiasis   Compliance with the medical regimen: Fair    Hypoglycemia: none  Glucose monitoring:  done 1 times a day or less         Glucometer: One Touch Ultra.      Blood Glucose readings by download:  Mean values apply above for all meters except median for One Touch  PRE-MEAL Fasting Lunch Dinner Bedtime Overall  Glucose range:  1 20-255    190, 286    Mean/median:     179    Glycemic control:   Lab Results  Component Value Date   HGBA1C 7.4 (H) 12/05/2016   HGBA1C 7.5 07/07/2016   HGBA1C 8.1 (H) 12/08/2015   Lab Results  Component Value Date   MICROALBUR 23.2 (H) 12/05/2016   LDLCALC 114 (H) 07/07/2016   CREATININE 1.00 12/05/2016    Self-care: The diet that the patient has been following is: tries to limit fats with most meals .      Meals: 2-3 meals per day. Breakfast is cereal or scrambled eggs variable .   Lunch is half sandwich, yogurt and fruit; dinner usually baked chicken, bread and vegetables,  snacks are fruits or nuts. Not eating out regularly, some fried foods Dinner; 9-10 pm  Exercise: Occasionally        Dietician visit: Most recent: 06/2015 CDE visit: 12/15              Weight history: Previous range 200-260  Wt Readings from Last 3 Encounters:  12/07/16 223 lb (101.2 kg)  07/07/16 227 lb (103 kg)  12/11/15 231 lb (104.8 kg)    Other active problems: discussed in review of systems   LABS:  Lab on 12/05/2016  Component Date Value Ref Range Status  . Color, Urine 12/05/2016 YELLOW  Yellow;Lt. Yellow Final  . APPearance 12/05/2016 CLEAR  Clear Final  . Specific Gravity, Urine 12/05/2016 1.015  1.000 - 1.030 Final  . pH 12/05/2016 6.0  5.0 - 8.0 Final  . Total Protein, Urine 12/05/2016 30* Negative Final  . Urine Glucose 12/05/2016 >=1000* Negative Final  . Ketones, ur 12/05/2016 NEGATIVE  Negative Final  . Bilirubin Urine 12/05/2016 NEGATIVE  Negative Final  . Hgb urine dipstick 12/05/2016 NEGATIVE  Negative Final  . Urobilinogen, UA 12/05/2016 1.0  0.0 - 1.0 Final  . Leukocytes, UA 12/05/2016 NEGATIVE  Negative Final  . Nitrite 12/05/2016 NEGATIVE  Negative Final  . WBC, UA 12/05/2016 none seen  0-2/hpf Final  . RBC / HPF 12/05/2016 none seen  0-2/hpf Final  . Squamous Epithelial / LPF 12/05/2016 Rare(0-4/hpf)  Rare(0-4/hpf) Final  . Microalb, Ur 12/05/2016 23.2* 0.0 - 1.9 mg/dL Final  . Creatinine,U 12/05/2016 65.3  mg/dL Final  . Microalb Creat Ratio 12/05/2016 35.5* 0.0 - 30.0 mg/g Final  . Cholesterol 12/05/2016 144  0 - 200 mg/dL Final   ATP III Classification       Desirable:  < 200 mg/dL               Borderline High:  200 - 239 mg/dL          High:  > = 240 mg/dL  . Triglycerides 12/05/2016 239.0* 0.0 - 149.0 mg/dL Final   Normal:  <150 mg/dLBorderline High:  150 - 199 mg/dL  . HDL 12/05/2016 34.20* >39.00 mg/dL Final  . VLDL 12/05/2016 47.8* 0.0 - 40.0 mg/dL Final  . Total CHOL/HDL Ratio 12/05/2016 4   Final                  Men          Women1/2  Average Risk     3.4          3.3Average Risk          5.0  4.42X Average Risk          9.6          7.13X Average Risk          15.0          11.0                      . NonHDL 12/05/2016 109.37   Final   NOTE:  Non-HDL goal should be 30 mg/dL higher than patient's LDL goal (i.e. LDL goal of < 70 mg/dL, would have non-HDL goal of < 100 mg/dL)  . Sodium 12/05/2016 140  135 - 145 mEq/L Final  . Potassium 12/05/2016 4.0  3.5 - 5.1 mEq/L Final  . Chloride 12/05/2016 104  96 - 112 mEq/L Final  . CO2 12/05/2016 28  19 - 32 mEq/L Final  . Glucose, Bld 12/05/2016 213* 70 - 99 mg/dL Final  . BUN 12/05/2016 26* 6 - 23 mg/dL Final  . Creatinine, Ser 12/05/2016 1.00  0.40 - 1.20 mg/dL Final  . Total Bilirubin 12/05/2016 0.4  0.2 - 1.2 mg/dL Final  . Alkaline Phosphatase 12/05/2016 64  39 - 117 U/L Final  . AST 12/05/2016 12  0 - 37 U/L Final  . ALT 12/05/2016 15  0 - 35 U/L Final  . Total Protein 12/05/2016 7.3  6.0 - 8.3 g/dL Final  . Albumin 12/05/2016 3.6  3.5 - 5.2 g/dL Final  . Calcium 12/05/2016 9.2  8.4 - 10.5 mg/dL Final  . GFR 12/05/2016 71.98  >60.00 mL/min Final  . Hgb A1c MFr Bld 12/05/2016 7.4* 4.6 - 6.5 % Final   Glycemic Control Guidelines for People with Diabetes:Non Diabetic:  <6%Goal of Therapy: <7%Additional Action Suggested:  >8%   . Direct LDL 12/05/2016 89.0  mg/dL Final   Optimal:  <100 mg/dLNear or Above Optimal:  100-129 mg/dLBorderline High:  130-159 mg/dLHigh:  160-189 mg/dLVery High:  >190 mg/dL     Allergies as of 12/07/2016      Reactions   Avapro [irbesartan] Other (See Comments)   headaches   Codeine Nausea And Vomiting   Lisinopril Itching      Medication List        Accurate as of 12/07/16  1:27 PM. Always use your most recent med list.          atorvastatin 40 MG tablet Commonly known as:  LIPITOR TAKE 1 TABLET BY MOUTH ONCE DAILY   candesartan 16 MG tablet Commonly known as:  ATACAND TAKE 1 TABLET BY MOUTH ONCE DAILY   furosemide 40 MG  tablet Commonly known as:  LASIX TAKE 1 TABLET BY MOUTH  DAILY   glucose blood test strip Commonly known as:  ONE TOUCH ULTRA TEST USE THREE TIMES DAILY TO CHECK BLOOD SUGAR   hydrocortisone valerate cream 0.2 % Commonly known as:  WESTCORT   insulin aspart protamine - aspart (70-30) 100 UNIT/ML FlexPen Commonly known as:  NOVOLOG MIX 70/30 FLEXPEN INJECT 20 UNITS SUBCUTANEOUSLY  WITH BREAKFAST AND  35  UNITS  AT  DINNER.   Insulin Pen Needle 32G X 4 MM Misc Commonly known as:  BD PEN NEEDLE NANO U/F Use 2 per day   JARDIANCE 25 MG Tabs tablet Generic drug:  empagliflozin TAKE ONE TABLET BY MOUTH ONCE DAILY   metFORMIN 1000 MG tablet Commonly known as:  GLUCOPHAGE TAKE ONE TABLET BY MOUTH TWICE DAILY WITH MEALS       Allergies:  Allergies  Allergen Reactions  .  Avapro [Irbesartan] Other (See Comments)    headaches  . Codeine Nausea And Vomiting  . Lisinopril Itching    Past Medical History:  Diagnosis Date  . Abnormal Pap smear   . Asthma   . Diabetes mellitus   . Hyperlipidemia   . Hypertension   . LGSIL (low grade squamous intraepithelial dysplasia)     Past Surgical History:  Procedure Laterality Date  . ABDOMINAL HYSTERECTOMY    . bladder surgery    . COLPOSCOPY      Family History  Problem Relation Age of Onset  . Diabetes Mother   . Diabetes Maternal Grandmother   . Heart disease Neg Hx   . Hypertension Neg Hx     Social History:  reports that  has never smoked. she has never used smokeless tobacco. She reports that she does not drink alcohol or use drugs.    Review of Systems   EDEMA: She Has been having significant edema, now  taking 25 mg Jardiance and also 40 mg Lasix  Hypertension:    She is taking Atacand, recently out medication and blood pressure is higher  She has not checked her blood pressure at home   BP Readings from Last 3 Encounters:  12/07/16 (!) 144/92  07/07/16 (!) 152/90  12/11/15 (!) 142/84      Lab Results   Component Value Date   CREATININE 1.00 12/05/2016   BUN 26 (H) 12/05/2016   NA 140 12/05/2016   K 4.0 12/05/2016   CL 104 12/05/2016   CO2 28 12/05/2016        Lipids:    LDL has been previously high, around 180 and She is on Lipitor 40 mg daily recently with good control of LDL        Lab Results  Component Value Date   CHOL 144 12/05/2016   HDL 34.20 (L) 12/05/2016   LDLCALC 114 (H) 07/07/2016   LDLDIRECT 89.0 12/05/2016   TRIG 239.0 (H) 12/05/2016   CHOLHDL 4 12/05/2016                Diabetic foot exam last done in 1/74 by PCP  Complications from diabetes: Proliferative retinopathy, microalbuminuria   Physical Examination:  BP (!) 144/92   Pulse 92   Ht _0  (1.626 m)   Wt 223 lb (101.2 kg)   SpO2 97%   BMI 38.28 kg/m     1+ ankle edema present   ASSESSMENT:  Diabetes type 2, with obesity See history of present illness for detailed discussion of his current management, blood sugar patterns and problems identified  Her blood sugars have been overall difficult to control and A1c consistently over 7% As discussed above there is inadequate glucose monitoring, compliance with insulin doses especially in the evening, variable activity level and diet and inadequate response to premixed insulin.  Unable to judge her postprandial readings adequately She again refuses consideration of an insulin pump Discussed potentially the use of the Omnipod insulin pump and showed her how this would work as well as the demonstration sample device   Hypertension: Blood pressure is higher but she has not been on her medication regularly She also needs to monitor at home     HYPERLIPIDEMIA: She has been on atorvastatin 40 mg regularly and is finally getting good control  PLAN for diabetes:    Start checking blood sugars at least once a day after one of her meals  If she is going to eat her lunch meal  she will take additional 8 units of insulin  She will make sure she  takes her evening insulin before eating every day  If she has higher readings after evening meal consistently met need to have her take basal bolus insulin separately  Continue Jardiance  Regular exercise  She will let us know if she wants to consider an insulin pump  Check blood pressure at home     Patient Instructions  Take 8 units before lunch if eating   Check blood sugars on waking up    Also check blood sugars about 2 hours after a meal and do this after different meals by rotation  Recommended blood sugar levels on waking up is 90-130 and about 2 hours after meal is 130-160  Please bring your blood sugar monitor to each visit, thank you  Walk daily    Counseling time on subjects discussed above is over 50% of today's 25 minute visit   Harleyquinn Gasser 12/07/2016, 1:27 PM   Note: This office note was prepared with Dragon voice recognition system technology. Any transcriptional errors that result from this process are unintentional.

## 2016-12-08 ENCOUNTER — Other Ambulatory Visit: Payer: Self-pay

## 2016-12-09 ENCOUNTER — Telehealth: Payer: Self-pay

## 2016-12-09 NOTE — Telephone Encounter (Signed)
Called patient and left a voice message to give her Dr Jodelle Green answer.

## 2016-12-09 NOTE — Telephone Encounter (Signed)
Patient wants to know if she should take her insulin if her blood sugar is 120 or less before she eat

## 2016-12-09 NOTE — Telephone Encounter (Signed)
Please advise 

## 2016-12-09 NOTE — Telephone Encounter (Signed)
Yes

## 2017-01-27 ENCOUNTER — Other Ambulatory Visit: Payer: Self-pay | Admitting: Endocrinology

## 2017-02-01 ENCOUNTER — Other Ambulatory Visit: Payer: Self-pay | Admitting: Endocrinology

## 2017-02-03 ENCOUNTER — Other Ambulatory Visit: Payer: Medicare Other

## 2017-02-06 ENCOUNTER — Other Ambulatory Visit: Payer: Self-pay | Admitting: Endocrinology

## 2017-02-06 ENCOUNTER — Other Ambulatory Visit (INDEPENDENT_AMBULATORY_CARE_PROVIDER_SITE_OTHER): Payer: Medicare Other

## 2017-02-06 DIAGNOSIS — Z794 Long term (current) use of insulin: Secondary | ICD-10-CM | POA: Diagnosis not present

## 2017-02-06 DIAGNOSIS — E1165 Type 2 diabetes mellitus with hyperglycemia: Secondary | ICD-10-CM

## 2017-02-06 LAB — BASIC METABOLIC PANEL
BUN: 34 mg/dL — ABNORMAL HIGH (ref 6–23)
CO2: 29 mEq/L (ref 19–32)
Calcium: 9.4 mg/dL (ref 8.4–10.5)
Chloride: 100 mEq/L (ref 96–112)
Creatinine, Ser: 1.23 mg/dL — ABNORMAL HIGH (ref 0.40–1.20)
GFR: 56.65 mL/min — ABNORMAL LOW (ref >60.00)
Glucose, Bld: 103 mg/dL — ABNORMAL HIGH (ref 70–99)
Potassium: 4.2 mEq/L (ref 3.5–5.1)
Sodium: 138 mEq/L (ref 135–145)

## 2017-02-07 ENCOUNTER — Other Ambulatory Visit: Payer: Self-pay

## 2017-02-07 ENCOUNTER — Ambulatory Visit (INDEPENDENT_AMBULATORY_CARE_PROVIDER_SITE_OTHER): Payer: Medicare Other | Admitting: Endocrinology

## 2017-02-07 ENCOUNTER — Encounter: Payer: Self-pay | Admitting: Endocrinology

## 2017-02-07 VITALS — BP 138/80 | HR 94 | Ht 64.0 in | Wt 228.0 lb

## 2017-02-07 DIAGNOSIS — Z794 Long term (current) use of insulin: Secondary | ICD-10-CM | POA: Diagnosis not present

## 2017-02-07 DIAGNOSIS — E1165 Type 2 diabetes mellitus with hyperglycemia: Secondary | ICD-10-CM | POA: Diagnosis not present

## 2017-02-07 LAB — FRUCTOSAMINE: Fructosamine: 271 umol/L (ref 0–285)

## 2017-02-07 MED ORDER — INSULIN DEGLUDEC 100 UNIT/ML ~~LOC~~ SOPN: 15.0000 [IU] | PEN_INJECTOR | Freq: Every day | SUBCUTANEOUS | 1 refills | Status: DC

## 2017-02-07 MED ORDER — ATORVASTATIN CALCIUM 40 MG PO TABS: 40.0000 mg | ORAL_TABLET | Freq: Every day | ORAL | 1 refills | Status: DC

## 2017-02-07 MED ORDER — CANAGLIFLOZIN 300 MG PO TABS: 300.0000 mg | ORAL_TABLET | Freq: Every day | ORAL | 3 refills | Status: DC

## 2017-02-07 NOTE — Patient Instructions (Addendum)
Reduce Novolog to 40 in pm and start 8 units of Tresiba at supper also Tyler Aas should bring am sugar to under 130  After 1 week call readings  Walk daily  Check blood sugars on waking up  daily  Also check blood sugars about 2 hours after a meal and do this after different meals by rotation  Recommended blood sugar levels on waking up is 90-130 and about 2 hours after meal is 130-160  Please bring your blood sugar monitor to each visit, thank you

## 2017-02-07 NOTE — Progress Notes (Signed)
Patient ID: Stephanie [name], female   DOB: Jun 05, 1955, 62 y.o.   MRN: [id number]           Reason for Appointment: Followup of diabetes and hypertension  Referring physician: Hulan Fess  History of Present Illness:          Diagnosis: Type 2 diabetes mellitus, date of diagnosis: 2003       Past history:  She was initially treated metformin at some point glimepiride also added and not clear what her level of control was in the first few years. Had been only taking 500 mg twice a day of metformin and Amaryl increased to 4 mg twice a day several years ago In the last few years her blood sugar control has been more difficult and she has been managed by an endocrinologist Because of poor control she had been tried on Janumet and [place] but she states that she could not tolerate them because of headaches Had poor control with Amaryl and metformin and also difficulty with weight gain she had started Trulicity in 67/6195 on her initial consultation Her weight on her initial visit was 240 pounds. Her A1c had gone down to 7.1% in 09/32 with using Trulicity which she was tolerating at that time However in 2/16 she stopped taking Trulicity as it was causing nausea , subsequently did not want to go on Tanzeum Because of markedly increased sugars she has been on premixed insulin since 4/16  Recent history:   INSULIN DOSE: Novolog Mix 75/25, 20 before breakfast and 44 before supper irreg  When blood sugars were poorly controlled in 12/16 she was started on Jardiance She has not been seen in follow-up since 6/18    A1c is 7.4 in 11/18, stable Fructosamine is 271  Current diabetes history, management, problems and blood sugar readings:  Her blood sugars are fairly consistently high fasting although do not appear to be higher later in the day  She was told to consider the Omnipod insulin pump on the last visit but she does not want to do that  She has been reluctant to add a second insulin to  control her overnight blood sugars  Currently not doing any walking and has gained significant weight  She ran out of Jardiance about 3-4 days ago and apparently his not being covered by insurance anymore and she does not know what is covered  She has gained 5 pounds and has not been consistent with diet  Does not get motivated to exercise despite reminders  Hypoglycemia the lowest blood sugar 90 around 6 PM       Oral hypoglycemic drugs the patient is taking are: Metformin 1 g twice a day, Jardiance 25 mg daily     Side effects from medications have been: Januvia, Onglyza apparently caused headaches, Invokana: Candidiasis   Compliance with the medical regimen: Fair    Hypoglycemia: none  Glucose monitoring:  done 1 times a day or less         Glucometer: One Touch Ultra.      Blood Glucose readings by download:  Mean values apply above for all meters except median for One Touch  PRE-MEAL Fasting Lunch Dinner Bedtime Overall  Glucose range:  1 44-207   90-141  100-182    Mean/median: 180  154   144  179    Glycemic control:   Lab Results  Component Value Date   HGBA1C 7.4 (H) 12/05/2016   HGBA1C 7.5 07/07/2016   HGBA1C 8.1 (H)  12/08/2015   Lab Results  Component Value Date   MICROALBUR 23.2 (H) 12/05/2016   LDLCALC 114 (H) 07/07/2016   CREATININE 1.23 (H) 02/06/2017    Self-care: The diet that the patient has been following is: tries to limit fats with most meals .      Meals: 2-3 meals per day. Breakfast is cereal or scrambled eggs variable .   Lunch is half sandwich, yogurt and fruit; dinner usually baked chicken, bread and vegetables, snacks are fruits or nuts. Not eating out regularly, some fried foods Dinner; 9-10 pm  Exercise: Occasionally        Dietician visit: Most recent: 06/2015 CDE visit: 12/15              Weight history: Previous range 200-260  Wt Readings from Last 3 Encounters:  02/07/17 228 lb (103.4 kg)  12/07/16 223 lb (101.2 kg)  07/07/16  227 lb (103 kg)    Other active problems: discussed in review of systems   LABS:  Lab on 02/06/2017  Component Date Value Ref Range Status  . Fructosamine 02/06/2017 271  0 - 285 umol/L Final   Comment: Published reference interval for apparently healthy subjects between age 32 and 74 is 46 - 285 umol/L and in a poorly controlled diabetic population is 228 - 563 umol/L with a mean of 396 umol/L.   Marland Kitchen Sodium 02/06/2017 138  135 - 145 mEq/L Final  . Potassium 02/06/2017 4.2  3.5 - 5.1 mEq/L Final  . Chloride 02/06/2017 100  96 - 112 mEq/L Final  . CO2 02/06/2017 29  19 - 32 mEq/L Final  . Glucose, Bld 02/06/2017 103* 70 - 99 mg/dL Final  . BUN 02/06/2017 34* 6 - 23 mg/dL Final  . Creatinine, Ser 02/06/2017 1.23* 0.40 - 1.20 mg/dL Final  . Calcium 02/06/2017 9.4  8.4 - 10.5 mg/dL Final  . GFR 02/06/2017 56.65* >60.00 mL/min Final     Allergies as of 02/07/2017      Reactions   Avapro [irbesartan] Other (See Comments)   headaches   Codeine Nausea And Vomiting   Lisinopril Itching      Medication List        Accurate as of 02/07/17  1:19 PM. Always use your most recent med list.          atorvastatin 40 MG tablet Commonly known as:  LIPITOR TAKE 1 TABLET BY MOUTH ONCE DAILY   canagliflozin 300 MG Tabs tablet Commonly known as:  INVOKANA Take 1 tablet (300 mg total) by mouth daily before breakfast.   candesartan 16 MG tablet Commonly known as:  ATACAND TAKE 1 TABLET BY MOUTH ONCE DAILY   empagliflozin 25 MG Tabs tablet Commonly known as:  JARDIANCE Take 25 mg daily by mouth.   furosemide 40 MG tablet Commonly known as:  LASIX TAKE 1 TABLET BY MOUTH ONCE DAILY   glucose blood test strip Commonly known as:  ONE TOUCH ULTRA TEST USE THREE TIMES DAILY TO CHECK BLOOD SUGAR   hydrocortisone valerate cream 0.2 % Commonly known as:  WESTCORT   insulin aspart protamine - aspart (70-30) 100 UNIT/ML FlexPen Commonly known as:  NOVOLOG MIX 70/30 FLEXPEN INJECT 20  UNITS SUBCUTANEOUSLY  WITH BREAKFAST AND  35  UNITS  AT  DINNER.   insulin degludec 100 UNIT/ML Sopn FlexTouch Pen Commonly known as:  TRESIBA FLEXTOUCH Inject 0.15 mLs (15 Units total) into the skin daily.   Insulin Pen Needle 32G X 4 MM Misc Commonly known  as:  BD PEN NEEDLE NANO U/F Use 2 per day   metFORMIN 1000 MG tablet Commonly known as:  GLUCOPHAGE Take 1 tablet (1,000 mg total) 2 (two) times daily with a meal by mouth.       Allergies:  Allergies  Allergen Reactions  . Avapro [Irbesartan] Other (See Comments)    headaches  . Codeine Nausea And Vomiting  . Lisinopril Itching    Past Medical History:  Diagnosis Date  . Abnormal Pap smear   . Asthma   . Diabetes mellitus   . Hyperlipidemia   . Hypertension   . LGSIL (low grade squamous intraepithelial dysplasia)     Past Surgical History:  Procedure Laterality Date  . ABDOMINAL HYSTERECTOMY    . bladder surgery    . COLPOSCOPY      Family History  Problem Relation Age of Onset  . Diabetes Mother   . Diabetes Maternal Grandmother   . Heart disease Neg Hx   . Hypertension Neg Hx     Social History:  reports that  has never smoked. she has never used smokeless tobacco. She reports that she does not drink alcohol or use drugs.    Review of Systems   EDEMA: She Has been having significant edema, now  taking 25 mg Jardiance and also 40 mg Lasix  Hypertension:    She is taking Atacand 16 mg Also taking Lasix for edema  She has not checked her blood pressure at home   BP Readings from Last 3 Encounters:  02/07/17 138/80  12/07/16 (!) 144/92  07/07/16 (!) 152/90   Renal functions:   Lab Results  Component Value Date   CREATININE 1.23 (H) 02/06/2017   BUN 34 (H) 02/06/2017   NA 138 02/06/2017   K 4.2 02/06/2017   CL 100 02/06/2017   CO2 29 02/06/2017        Lipids:    LDL has been previously high, around 180 and She is on Lipitor 40 mg daily with good control of LDL        Lab  Results  Component Value Date   CHOL 144 12/05/2016   HDL 34.20 (L) 12/05/2016   LDLCALC 114 (H) 07/07/2016   LDLDIRECT 89.0 12/05/2016   TRIG 239.0 (H) 12/05/2016   CHOLHDL 4 12/05/2016                Diabetic foot exam last done in 0/10 by PCP  Complications from diabetes: Proliferative retinopathy, microalbuminuria   Physical Examination:  BP 138/80   Pulse 94   Ht 5\' 4"  (1.626 m)   Wt 228 lb (103.4 kg)   SpO2 96%   BMI 39.14 kg/m      ASSESSMENT:  Diabetes type 2, with obesity See history of present illness for detailed discussion of his current management, blood sugar patterns and problems identified  Her blood sugars have been consistently not controlled Although A1c has been on for and fructosamine is not recently high she has fasting blood sugars averaging about 180 She is not understanding the need to change her insulin type to get better control over 24 hours and she is also reluctant to use an insulin pump as discussed again  Since she is refusing to separate basal and bolus insulin regimens she will start taking additional basal insulin in addition to her premixed insulin twice a day Not clear if Vania Rea is not covered by insurance but he is not able to afford the co-pay currently and is recently  not taking this Also has gained weight  Hypertension: Blood pressure is better today    PLAN for diabetes:    Start TRESIBA once a day starting with 8 units at suppertime along with her NovoLog mix insulin.  Discussed in detail how basal insulin is different and timing of injection, duration of action and dosage adjustment  For now can reduce her NovoLog mix by 4 units  She will need to continue adjusting her Tyler Aas based on fasting blood sugars but she does not appear comfortable doing this on her own  Discussed blood sugar targets fasting and also after meals  She will call blood sugar readings in 5-7 days for further adjustment  Will try to get her  on Invokana instead of Jardiance to see this is better covered by her insurance  Will need short-term follow-up  Discussed regular exercise and need for weight loss  Patient has several questions about insulin and all these were answered  Patient Instructions  Reduce Novolog to 40 in pm and start 8 units of Tresiba at supper also Tyler Aas should bring am sugar to under 130  After 1 week call readings  Walk daily  Check blood sugars on waking up  daily  Also check blood sugars about 2 hours after a meal and do this after different meals by rotation  Recommended blood sugar levels on waking up is 90-130 and about 2 hours after meal is 130-160  Please bring your blood sugar monitor to each visit, thank you     Counseling time on subjects discussed above is over 50% of today's 25 minute visit   Elayne Snare 02/07/2017, 1:19 PM   Note: This office note was prepared with Dragon voice recognition system technology. Any transcriptional errors that result from this process are unintentional.

## 2017-02-08 ENCOUNTER — Other Ambulatory Visit: Payer: Self-pay

## 2017-02-08 ENCOUNTER — Telehealth: Payer: Self-pay | Admitting: Endocrinology

## 2017-02-08 MED ORDER — INSULIN GLARGINE 300 UNIT/ML ~~LOC~~ SOPN: 15.0000 [IU] | PEN_INJECTOR | Freq: Every day | SUBCUTANEOUS | 2 refills | Status: DC

## 2017-02-08 NOTE — Telephone Encounter (Signed)
Patient stated she can not afford the new insulin that was prescribed to her, is there another alternative. Please advise Leave a voice mail if you can't reach her.

## 2017-02-08 NOTE — Telephone Encounter (Signed)
Please send Toujeo, same dose

## 2017-02-08 NOTE — Telephone Encounter (Signed)
I switched this today and left patient a vm letting her know that I sent Toujeo in to the [facility] on [address]

## 2017-02-10 ENCOUNTER — Ambulatory Visit
Admission: RE | Admit: 2017-02-10 | Discharge: 2017-02-10 | Disposition: A | Discharge status: Home / Self Care | Payer: Medicare Other | Source: Ambulatory Visit | Attending: Obstetrics and Gynecology | Admitting: Obstetrics and Gynecology

## 2017-02-10 DIAGNOSIS — Z1231 Encounter for screening mammogram for malignant neoplasm of breast: Secondary | ICD-10-CM

## 2017-02-14 DIAGNOSIS — Z01419 Encounter for gynecological examination (general) (routine) without abnormal findings: Secondary | ICD-10-CM | POA: Diagnosis not present

## 2017-02-14 DIAGNOSIS — Z6841 Body Mass Index (BMI) 40.0 and over, adult: Secondary | ICD-10-CM | POA: Diagnosis not present

## 2017-02-14 DIAGNOSIS — Z124 Encounter for screening for malignant neoplasm of cervix: Secondary | ICD-10-CM | POA: Diagnosis not present

## 2017-02-21 ENCOUNTER — Other Ambulatory Visit: Payer: Self-pay | Admitting: Endocrinology

## 2017-02-28 DIAGNOSIS — J209 Acute bronchitis, unspecified: Secondary | ICD-10-CM | POA: Diagnosis not present

## 2017-03-07 ENCOUNTER — Telehealth: Payer: Self-pay

## 2017-03-07 NOTE — Telephone Encounter (Signed)
Pt is aware.  

## 2017-03-07 NOTE — Telephone Encounter (Signed)
Received fax from pharmacy that [facility] was to expensive for pt, called pt to infrom her to call her insurance to find out the cheaper alternative. LMTCB

## 2017-03-08 MED ORDER — INSULIN DEGLUDEC 100 UNIT/ML ~~LOC~~ SOPN: 15.0000 [IU] | PEN_INJECTOR | Freq: Every day | SUBCUTANEOUS | 1 refills | Status: DC

## 2017-03-08 NOTE — Telephone Encounter (Signed)
Could pt get Stephanie [name] again to replace Toujeo sent in to pharmacy because patient has a coupon card is for that medication. Pt is picking up a coupon card for Jardiance and is having a tier exception form for Novolog 70/30 because of cost. Please advise

## 2017-03-08 NOTE — Telephone Encounter (Signed)
She can use the same dose of [place] as the [place], she previously had [place] prescribed anyway.  She can continue [place]

## 2017-03-08 NOTE — Addendum Note (Signed)
Addended by: Drucilla Schmidt on: 03/08/2017 02:14 PM   Modules accepted: Orders

## 2017-03-08 NOTE — Telephone Encounter (Signed)
Coupon cards up front for pt and resent the medication for the pt.

## 2017-03-09 ENCOUNTER — Telehealth: Payer: Self-pay

## 2017-03-09 NOTE — Telephone Encounter (Signed)
Take valsartan 160 mg

## 2017-03-09 NOTE — Telephone Encounter (Signed)
Pt is requesting that her Candesartan RX be switched to Losartan or Valsartan per insurance preference. States her insurance is sending over a tier price reduction form for her Novolog as well

## 2017-03-10 MED ORDER — VALSARTAN 160 MG PO TABS: 160.0000 mg | ORAL_TABLET | Freq: Every day | ORAL | 0 refills | Status: DC

## 2017-03-10 NOTE — Telephone Encounter (Signed)
Pt is aware.  

## 2017-03-10 NOTE — Addendum Note (Signed)
Addended by: Drucilla Schmidt on: 03/10/2017 08:08 AM   Modules accepted: Orders

## 2017-03-16 ENCOUNTER — Other Ambulatory Visit: Payer: Self-pay

## 2017-03-16 ENCOUNTER — Other Ambulatory Visit: Payer: Medicare Other

## 2017-03-16 ENCOUNTER — Telehealth: Payer: Self-pay

## 2017-03-16 MED ORDER — OLMESARTAN MEDOXOMIL 20 MG PO TABS: 20.0000 mg | ORAL_TABLET | Freq: Every day | ORAL | 0 refills | Status: DC

## 2017-03-16 MED ORDER — INSULIN LISPRO PROT & LISPRO (75-25 MIX) 100 UNIT/ML KWIKPEN: PEN_INJECTOR | SUBCUTANEOUS | 1 refills | Status: DC

## 2017-03-16 NOTE — Telephone Encounter (Signed)
sent 

## 2017-03-16 NOTE — Telephone Encounter (Signed)
Change to generic Benicar 20 mg daily

## 2017-03-16 NOTE — Telephone Encounter (Signed)
Per pharmacy Valsartan is on recall, please advise on switching medication.  Pharmacy [facility] [phone]

## 2017-03-17 ENCOUNTER — Telehealth: Payer: Self-pay

## 2017-03-17 NOTE — Telephone Encounter (Signed)
PA received for Humalog Pen Mix 75/25. Please advise

## 2017-03-20 ENCOUNTER — Other Ambulatory Visit: Payer: Self-pay

## 2017-03-20 MED ORDER — "INSULIN SYRINGE 30G X 5/16"" 1 ML MISC": 1 refills | Status: DC

## 2017-03-20 MED ORDER — INSULIN ASPART PROT & ASPART (70-30 MIX) 100 UNIT/ML ~~LOC~~ SUSP: SUBCUTANEOUS | 1 refills | Status: DC

## 2017-03-20 NOTE — Telephone Encounter (Signed)
Pt was already on Novolog 70/30 and stated it was to expensive, it is covered but she can not afford it. Humalog is not covered but she wanted to try to see if it was incase.

## 2017-03-20 NOTE — Addendum Note (Signed)
Addended by: Drucilla Schmidt on: 03/20/2017 01:07 PM   Modules accepted: Orders

## 2017-03-20 NOTE — Telephone Encounter (Signed)
She will have to get the Novolin 70/30 will be available with a syringe and vial only.  She does not have any other option.  She can talk to [facility] about training her on a syringe if she does not know how to do so

## 2017-03-20 NOTE — Telephone Encounter (Signed)
Pt is aware.  

## 2017-03-20 NOTE — Telephone Encounter (Signed)
She can try taking NOVOLOG MIX 70/30 same dosage.  However she did not show up for labs and need to know if she is going to keep her appointment tomorrow

## 2017-03-21 ENCOUNTER — Telehealth: Payer: Self-pay | Admitting: Endocrinology

## 2017-03-21 ENCOUNTER — Encounter: Payer: Medicare Other | Attending: Endocrinology | Admitting: Nutrition

## 2017-03-21 ENCOUNTER — Encounter: Payer: Self-pay | Admitting: Endocrinology

## 2017-03-21 ENCOUNTER — Ambulatory Visit (INDEPENDENT_AMBULATORY_CARE_PROVIDER_SITE_OTHER): Payer: Medicare Other | Admitting: Endocrinology

## 2017-03-21 VITALS — BP 118/72 | HR 95 | Ht 64.0 in | Wt 234.2 lb

## 2017-03-21 DIAGNOSIS — Z794 Long term (current) use of insulin: Secondary | ICD-10-CM | POA: Diagnosis not present

## 2017-03-21 DIAGNOSIS — E1165 Type 2 diabetes mellitus with hyperglycemia: Secondary | ICD-10-CM

## 2017-03-21 DIAGNOSIS — E669 Obesity, unspecified: Secondary | ICD-10-CM | POA: Diagnosis not present

## 2017-03-21 DIAGNOSIS — Z713 Dietary counseling and surveillance: Secondary | ICD-10-CM | POA: Insufficient documentation

## 2017-03-21 DIAGNOSIS — E1169 Type 2 diabetes mellitus with other specified complication: Secondary | ICD-10-CM | POA: Insufficient documentation

## 2017-03-21 DIAGNOSIS — I10 Essential (primary) hypertension: Secondary | ICD-10-CM

## 2017-03-21 LAB — POCT GLYCOSYLATED HEMOGLOBIN (HGB A1C): Hemoglobin A1C: 7.4

## 2017-03-21 MED ORDER — NOVOLIN 70/30 RELION (70-30) 100 UNIT/ML ~~LOC~~ SUSP: SUBCUTANEOUS | 1 refills | Status: DC

## 2017-03-21 NOTE — Telephone Encounter (Signed)
Patient stated she went to her doctor to get her medication it was 500.00 she can't afford that. Please advise

## 2017-03-21 NOTE — Telephone Encounter (Signed)
If she is talking about the generic Benicar then please change it to LOSARTAN 100 mg daily

## 2017-03-21 NOTE — Telephone Encounter (Signed)
New Rx sent. See meds. Pt informed

## 2017-03-21 NOTE — Progress Notes (Signed)
Patient ID: Stephanie [name], female   DOB: [birth year], 61 y.o.   MRN: [id number]           Reason for Appointment: Followup of diabetes and hypertension  Referring physician: Hulan Fess  History of Present Illness:          Diagnosis: Type 2 diabetes mellitus, date of diagnosis: 2003       Past history:  She was initially treated metformin at some point glimepiride also added and not clear what her level of control was in the first few years. Had been only taking 500 mg twice a day of metformin and Amaryl increased to 4 mg twice a day several years ago In the last few years her blood sugar control has been more difficult and she has been managed by an endocrinologist Because of poor control she had been tried on Janumet and [place] but she states that she could not tolerate them because of headaches Had poor control with Amaryl and metformin and also difficulty with weight gain she had started Trulicity in 65/9935 on her initial consultation Her weight on her initial visit was 240 pounds. Her A1c had gone down to 7.1% in 70/17 with using Trulicity which she was tolerating at that time However in 2/16 she stopped taking Trulicity as it was causing nausea , subsequently did not want to go on Tanzeum Because of markedly increased sugars she has been on premixed insulin since 4/16  Recent history:   INSULIN DOSE: Novolog Mix 70/30, 20 before breakfast and 44 before supper   When blood sugars were poorly controlled in 12/16 she was started on Jardiance    A1c is 7.4 again  Current diabetes history, management, problems and blood sugar readings:  Her blood sugars recently appeared to be generally higher throughout the day  She was blaming the high sugars on a 40 glucose monitoring she thought it was fluctuating excessively  However she appears to be taking her evening insulin at variable times, sometimes at bedtime including last night if she forgets at suppertime  She may have  better readings in the mornings if she takes her evening insulin later at night otherwise they are averaging over 200  She is still not taking Jardiance which she stopped in January because of high out-of-pocket expense  She is also questioning the need for taking metformin  She was also recommended taking Tresiba to help her fasting readings but she did not get this filled because of excessive cost out-of-pocket  She continues to gain weight and has gained 11 pounds total since November  She now said that she cannot afford to buy the Novolog Mix and wants to generic but does not know how to do injection with a syringe  Does not get motivated to exercise again  No recent lab glucose available  Oral hypoglycemic drugs the patient is taking are: Metformin 1 g twice a day, was on Jardiance 25 mg daily     Side effects from medications have been: Januvia, Onglyza apparently caused headaches, Invokana: Candidiasis   Compliance with the medical regimen: Fair     Glucose monitoring:  done 1 times a day or less         Glucometer: One Touch Ultra.      Blood Glucose readings by download:  Mean values apply above for all meters except median for One Touch  PRE-MEAL Fasting Lunch Dinner Bedtime Overall  Glucose range: 106-382   127-264  236-384    Mean/median:  205  181  170   199+/-60     Glycemic control:   Lab Results  Component Value Date   HGBA1C 7.4 03/21/2017   HGBA1C 7.4 (H) 12/05/2016   HGBA1C 7.5 07/07/2016   Lab Results  Component Value Date   MICROALBUR 23.2 (H) 12/05/2016   LDLCALC 114 (H) 07/07/2016   CREATININE 1.23 (H) 02/06/2017    Self-care: The diet that the patient has been following is: tries to limit fats with most meals .      Meals: 2-3 meals per day. Breakfast is cereal or scrambled eggs variable .   Lunch is half sandwich, yogurt and fruit; dinner usually baked chicken, bread and vegetables, snacks are fruits or nuts. Not eating out regularly, some  fried foods Dinner; 9-10 pm  Exercise: Minimal        Dietician visit: Most recent: 06/2015 CDE visit: 12/15              Weight history: Previous range 200-260  Wt Readings from Last 3 Encounters:  03/21/17 234 lb 3.2 oz (106.2 kg)  02/07/17 228 lb (103.4 kg)  12/07/16 223 lb (101.2 kg)    Other active problems: discussed in review of systems   LABS:  Office Visit on 03/21/2017  Component Date Value Ref Range Status  . Hemoglobin A1C 03/21/2017 7.4   Final     Allergies as of 03/21/2017      Reactions   Avapro [irbesartan] Other (See Comments)   headaches   Codeine Nausea And Vomiting   Lisinopril Itching      Medication List        Accurate as of 03/21/17  8:44 PM. Always use your most recent med list.          atorvastatin 40 MG tablet Commonly known as:  LIPITOR Take 1 tablet (40 mg total) by mouth daily.   candesartan 16 MG tablet Commonly known as:  ATACAND TAKE 1 TABLET BY MOUTH ONCE DAILY   furosemide 40 MG tablet Commonly known as:  LASIX TAKE 1 TABLET BY MOUTH ONCE DAILY   glucose blood test strip Commonly known as:  ONE TOUCH ULTRA TEST USE THREE TIMES DAILY TO CHECK BLOOD SUGAR   hydrocortisone valerate cream 0.2 % Commonly known as:  WESTCORT   Insulin Pen Needle 32G X 4 MM Misc Commonly known as:  BD PEN NEEDLE NANO U/F Use 2 per day   INSULIN SYRINGE 1CC/30GX5/16" 30G X 5/16" 1 ML Misc Use for Novolog 70/30   metFORMIN 1000 MG tablet Commonly known as:  GLUCOPHAGE Take 1 tablet (1,000 mg total) 2 (two) times daily with a meal by mouth.   NOVOLIN 70/30 RELION (70-30) 100 UNIT/ML injection Generic drug:  insulin NPH-regular Human 20 units subcutaneous with breakfast, 35 units with dinner Please dispense Brand name Relion       Allergies:  Allergies  Allergen Reactions  . Avapro [Irbesartan] Other (See Comments)    headaches  . Codeine Nausea And Vomiting  . Lisinopril Itching    Past Medical History:  Diagnosis Date    . Abnormal Pap smear   . Asthma   . Diabetes mellitus   . Hyperlipidemia   . Hypertension   . LGSIL (low grade squamous intraepithelial dysplasia)     Past Surgical History:  Procedure Laterality Date  . ABDOMINAL HYSTERECTOMY    . bladder surgery    . COLPOSCOPY      Family History  Problem Relation Age of Onset  .  Diabetes Mother   . Diabetes Maternal Grandmother   . Heart disease Neg Hx   . Hypertension Neg Hx     Social History:  reports that  has never smoked. she has never used smokeless tobacco. She reports that she does not drink alcohol or use drugs.    Review of Systems   EDEMA: She Has been having significant edema, now  taking 25 mg Jardiance and also 40 mg Lasix  Hypertension:    She is taking Atacand 16 mg Also taking Lasix for edema  She has not checked her blood pressure at home   BP Readings from Last 3 Encounters:  03/21/17 118/72  02/07/17 138/80  12/07/16 (!) 144/92   Renal functions:   Lab Results  Component Value Date   CREATININE 1.23 (H) 02/06/2017   BUN 34 (H) 02/06/2017   NA 138 02/06/2017   K 4.2 02/06/2017   CL 100 02/06/2017   CO2 29 02/06/2017        Lipids:    LDL has been previously high, around 180 and She is on Lipitor 40 mg daily with good control of LDL        Lab Results  Component Value Date   CHOL 144 12/05/2016   HDL 34.20 (L) 12/05/2016   LDLCALC 114 (H) 07/07/2016   LDLDIRECT 89.0 12/05/2016   TRIG 239.0 (H) 12/05/2016   CHOLHDL 4 12/05/2016                Diabetic foot exam last done in 0/26 by PCP  Complications from diabetes: Proliferative retinopathy, microalbuminuria   Physical Examination:  BP 118/72 (BP Location: Left Arm, Patient Position: Sitting, Cuff Size: Large)   Pulse 95   Ht 5\' 4"  (1.626 m)   Wt 234 lb 3.2 oz (106.2 kg)   SpO2 96%   BMI 40.20 kg/m      ASSESSMENT:  Diabetes type 2, with obesity, insulin requiring  See history of present illness for detailed  discussion of his current management, blood sugar patterns and problems identified  Her blood sugars have been poorly controlled now with average blood sugar over 200 and glucose as high as 384 at home Discussed that her meter was not erroneous and she is having similar pattern of blood sugars as before She is not consistent with taking her insulin at the right time in the evening before dinner which may cause more variability  Also gaining weight from lack of exercise, inconsistent diet and not taking Jardiance because of cost now She does need to be on a basal bolus insulin regimen but she does not want to try to different insulin types This is despite explaining what her blood sugar patterns are Still is not that motivated to take her evening insulin consistently before eating and also not eating enough readings after a meal at night No exercise recently Also discussed benefits of metformin which she wants to stop  Hypertension: Blood pressure is controlled and advised her to go ahead and start her Benicar and she is about to finish her Atacand    PLAN for diabetes:    She will be instructed by the nurse educator in using the syringe to start on [employer] brand Novolin 70/30  For now she will continue the same dose as before  Emphasized the need for taking this at least 15-30 minutes before her planned meals  She will need to continue metformin  She does need to start walking regularly  Will need follow-up  in 4 weeks  Counseling time on subjects discussed in assessment and plan sections is over 50% of today's 25 minute visit   Patient Instructions  Walk daily  Check blood sugars on waking up  3/7 days  Also check blood sugars about 2 hours after a meal and do this after different meals by rotation  Recommended blood sugar levels on waking up is 90-130 and about 2 hours after meal is 130-160  Please bring your blood sugar monitor to each visit, thank you  Take 24 units  before Bfst and 48 before dinner  Novolin 70/30 will be 15-30 min before the meal, even when eating out        Stephanie [name] 03/21/2017, 8:44 PM   Note: This office note was prepared with Dragon voice recognition system technology. Any transcriptional errors that result from this process are unintentional.

## 2017-03-21 NOTE — Telephone Encounter (Deleted)
Patient stated she went to her doctor to get her medication it was 500.00 she can't afford that. Please advise

## 2017-03-21 NOTE — Telephone Encounter (Signed)
Please advise on below  

## 2017-03-21 NOTE — Telephone Encounter (Signed)
She  told me that she already has enough Novolog Mix 70/30.  Please send her the Novolin 70/30, Relion brand only with the same doses as the Novolog Mix

## 2017-03-21 NOTE — Patient Instructions (Addendum)
Walk daily  Check blood sugars on waking up  3/7 days  Also check blood sugars about 2 hours after a meal and do this after different meals by rotation  Recommended blood sugar levels on waking up is 90-130 and about 2 hours after meal is 130-160  Please bring your blood sugar monitor to each visit, thank you  Take 24 units before Bfst and 48 before dinner  Novolin 70/30 will be 15-30 min before the meal, even when eating out

## 2017-03-21 NOTE — Telephone Encounter (Signed)
I called pt- she is referring to the Novolin 70/30. She states she needs a cheaper alternative. Please advise.

## 2017-03-22 NOTE — Progress Notes (Signed)
We discussed the difference between the insulin she was using,and this insulin.  Stressed the need to wait 30-45 min. After injecting the insulin before eating.  She reported good understanding of this.  She was shown how to use the syringe and she re demonstrated this with good accuracy.  She had no final questions.

## 2017-03-22 NOTE — Patient Instructions (Signed)
Wait 30-45 min. After injecting the insulin, before eating.

## 2017-03-27 ENCOUNTER — Telehealth: Payer: Self-pay

## 2017-03-27 NOTE — Telephone Encounter (Signed)
Denied tier exception for pts Novolog 70/30

## 2017-04-03 ENCOUNTER — Other Ambulatory Visit: Payer: Self-pay | Admitting: Endocrinology

## 2017-04-18 ENCOUNTER — Telehealth: Payer: Self-pay | Admitting: *Deleted

## 2017-04-18 NOTE — Telephone Encounter (Signed)
We need to below what her blood sugar readings have been after supper, she needs to test if she has symptoms again

## 2017-04-18 NOTE — Telephone Encounter (Signed)
Stephanie [name] in lab spoke with patient earlier today about a lab appointment and patient mentioned a medication problem.   I called pt- She states the Novolin 70/30 made her feel dizzy and have blurred vision last night. She wants to know if she should decrease the units in the evening. Please advise.

## 2017-04-19 NOTE — Telephone Encounter (Signed)
Pt informed of below.  

## 2017-04-21 ENCOUNTER — Telehealth: Payer: Self-pay | Admitting: Endocrinology

## 2017-04-21 NOTE — Telephone Encounter (Signed)
Patient returning call to office, leave message on voice mail

## 2017-04-24 ENCOUNTER — Other Ambulatory Visit (INDEPENDENT_AMBULATORY_CARE_PROVIDER_SITE_OTHER): Payer: Medicare Other

## 2017-04-24 DIAGNOSIS — E1165 Type 2 diabetes mellitus with hyperglycemia: Secondary | ICD-10-CM

## 2017-04-24 DIAGNOSIS — Z794 Long term (current) use of insulin: Secondary | ICD-10-CM

## 2017-04-24 LAB — COMPREHENSIVE METABOLIC PANEL
ALT: 19 U/L (ref 0–35)
AST: 20 U/L (ref 0–37)
Albumin: 4 g/dL (ref 3.5–5.2)
Alkaline Phosphatase: 60 U/L (ref 39–117)
BUN: 35 mg/dL — ABNORMAL HIGH (ref 6–23)
CO2: 29 mEq/L (ref 19–32)
Calcium: 10.3 mg/dL (ref 8.4–10.5)
Chloride: 100 mEq/L (ref 96–112)
Creatinine, Ser: 1.19 mg/dL (ref 0.40–1.20)
GFR: 58.81 mL/min — ABNORMAL LOW (ref >60.00)
Glucose, Bld: 86 mg/dL (ref 70–99)
Potassium: 4 mEq/L (ref 3.5–5.1)
Sodium: 140 mEq/L (ref 135–145)
Total Bilirubin: 0.4 mg/dL (ref 0.2–1.2)
Total Protein: 8.2 g/dL (ref 6.0–8.3)

## 2017-04-25 ENCOUNTER — Other Ambulatory Visit: Payer: Medicare Other

## 2017-04-25 LAB — FRUCTOSAMINE: Fructosamine: 279 umol/L (ref 0–285)

## 2017-04-27 ENCOUNTER — Encounter: Payer: Self-pay | Admitting: Endocrinology

## 2017-04-27 ENCOUNTER — Ambulatory Visit (INDEPENDENT_AMBULATORY_CARE_PROVIDER_SITE_OTHER): Payer: Medicare Other | Admitting: Endocrinology

## 2017-04-27 VITALS — BP 146/82 | HR 90 | Ht 64.0 in | Wt 232.0 lb

## 2017-04-27 DIAGNOSIS — Z794 Long term (current) use of insulin: Secondary | ICD-10-CM | POA: Diagnosis not present

## 2017-04-27 DIAGNOSIS — E1165 Type 2 diabetes mellitus with hyperglycemia: Secondary | ICD-10-CM

## 2017-04-27 DIAGNOSIS — I10 Essential (primary) hypertension: Secondary | ICD-10-CM | POA: Diagnosis not present

## 2017-04-27 NOTE — Progress Notes (Signed)
Patient ID: Stephanie [name], female   DOB: 09/29/1955, 61 y.o.   MRN: [id number]           Reason for Appointment: Followup of diabetes and hypertension  Referring physician: Hulan Fess  History of Present Illness:          Diagnosis: Type 2 diabetes mellitus, date of diagnosis: 2003       Past history:  She was initially treated metformin at some point glimepiride also added and not clear what her level of control was in the first few years. Had been only taking 500 mg twice a day of metformin and Amaryl increased to 4 mg twice a day several years ago In the last few years her blood sugar control has been more difficult and she has been managed by an endocrinologist Because of poor control she had been tried on Janumet and [place] but she states that she could not tolerate them because of headaches Had poor control with Amaryl and metformin and also difficulty with weight gain she had started Trulicity in 78/2423 on her initial consultation Her weight on her initial visit was 240 pounds. Her A1c had gone down to 7.1% in 53/61 with using Trulicity which she was tolerating at that time However in 2/16 she stopped taking Trulicity as it was causing nausea , subsequently did not want to go on Tanzeum Because of markedly increased sugars she has been on premixed insulin since 4/16  Recent history:   INSULIN DOSE: Novolin Mix 70/30, 20 before breakfast and 44 before supper      A1c is 7.4 as of 02/2017  Current diabetes history, management, problems and blood sugar readings:  In February her blood sugars had been overall higher and was not clear why  She was also off Jardiance and was wanting to stop metformin also  Because of out-of-pocket expense she was changed from the NovoLog mix to the Novolin mix  Surprisingly her blood sugars are overall improved especially in the afternoon since she changed her insulin  She has not done much to change her diet or start an exercise  program  However has lost 2 pounds and may be doing a little better on her diet, previously had been continuing to gain weight  She does not understand the need to check readings AFTER meals and is focused on taking her blood sugar before the meal when she takes her insulin  She thinks she had a little dizziness when she was started the new insulin but she is trying to take this 45 minutes before eating rather than 30 minutes  Has no difficulty drawing up the new insulin with a syringe  FASTING readings are overall better although not consistent, previously averaging 205    Oral hypoglycemic drugs the patient is taking are: Metformin 1 g twice a day     Side effects from medications have been: Januvia, Onglyza apparently caused headaches, Invokana: Candidiasis   Compliance with the medical regimen: Fair    Dinner; usually at 7 pm  Glucose monitoring:  done 1 times a day or more       Glucometer: One Touch Ultra.      Blood Glucose readings by download:  Mean values apply above for all meters except median for One Touch  PRE-MEAL Fasting Lunch Dinner Bedtime Overall  Glucose range:  122-249  86-241  171, 300   Mean/median: 191     151+/-49   POST-MEAL  10 AM PC Lunch PC Dinner  Glucose range:  103-198    Mean/median:      Previous readings:  Mean values apply above for all meters except median for One Touch  PRE-MEAL Fasting Lunch Dinner Bedtime Overall  Glucose range: 106-382   127-264  236-384    Mean/median: 205  181  170   199+/-60     Glycemic control:   Lab Results  Component Value Date   HGBA1C 7.4 03/21/2017   HGBA1C 7.4 (H) 12/05/2016   HGBA1C 7.5 07/07/2016   Lab Results  Component Value Date   MICROALBUR 23.2 (H) 12/05/2016   LDLCALC 114 (H) 07/07/2016   CREATININE 1.19 04/24/2017    Self-care: The diet that the patient has been following is: tries to limit fats with most meals .      Meals: 2-3 meals per day. Breakfast is cereal or scrambled eggs  variable .   Lunch is half sandwich, yogurt and fruit; dinner usually baked chicken, bread and vegetables, snacks are fruits or nuts. Not eating out regularly  Exercise: Minimal        Dietician visit: Most recent: 06/2015 CDE visit: 12/15              Weight history: Previous range 200-260  Wt Readings from Last 3 Encounters:  04/27/17 232 lb (105.2 kg)  03/21/17 234 lb 3.2 oz (106.2 kg)  02/07/17 228 lb (103.4 kg)    Other active problems: discussed in review of systems   LABS:  Lab on 04/24/2017  Component Date Value Ref Range Status  . Fructosamine 04/24/2017 279  0 - 285 umol/L Final   Comment: Published reference interval for apparently healthy subjects between age 61 and 74 is 68 - 285 umol/L and in a poorly controlled diabetic population is 228 - 563 umol/L with a mean of 396 umol/L.   Marland Kitchen Sodium 04/24/2017 140  135 - 145 mEq/L Final  . Potassium 04/24/2017 4.0  3.5 - 5.1 mEq/L Final  . Chloride 04/24/2017 100  96 - 112 mEq/L Final  . CO2 04/24/2017 29  19 - 32 mEq/L Final  . Glucose, Bld 04/24/2017 86  70 - 99 mg/dL Final  . BUN 04/24/2017 35* 6 - 23 mg/dL Final  . Creatinine, Ser 04/24/2017 1.19  0.40 - 1.20 mg/dL Final  . Total Bilirubin 04/24/2017 0.4  0.2 - 1.2 mg/dL Final  . Alkaline Phosphatase 04/24/2017 60  39 - 117 U/L Final  . AST 04/24/2017 20  0 - 37 U/L Final  . ALT 04/24/2017 19  0 - 35 U/L Final  . Total Protein 04/24/2017 8.2  6.0 - 8.3 g/dL Final  . Albumin 04/24/2017 4.0  3.5 - 5.2 g/dL Final  . Calcium 04/24/2017 10.3  8.4 - 10.5 mg/dL Final  . GFR 04/24/2017 58.81* >60.00 mL/min Final     Allergies as of 04/27/2017      Reactions   Avapro [irbesartan] Other (See Comments)   headaches   Codeine Nausea And Vomiting   Lisinopril Itching      Medication List        Accurate as of 04/27/17  2:39 PM. Always use your most recent med list.          atorvastatin 40 MG tablet Commonly known as:  LIPITOR Take 1 tablet (40 mg total) by  mouth daily.   candesartan 16 MG tablet Commonly known as:  ATACAND TAKE 1 TABLET BY MOUTH ONCE DAILY   furosemide 40 MG tablet Commonly known as:  LASIX TAKE  1 TABLET BY MOUTH ONCE DAILY   glucose blood test strip Commonly known as:  ONE TOUCH ULTRA TEST USE THREE TIMES DAILY TO CHECK BLOOD SUGAR   hydrocortisone valerate cream 0.2 % Commonly known as:  WESTCORT   Insulin Pen Needle 32G X 4 MM Misc Commonly known as:  BD PEN NEEDLE NANO U/F Use 2 per day   INSULIN SYRINGE 1CC/30GX5/16" 30G X 5/16" 1 ML Misc Use for Novolog 70/30   metFORMIN 1000 MG tablet Commonly known as:  GLUCOPHAGE Take 1 tablet (1,000 mg total) 2 (two) times daily with a meal by mouth.   NOVOLIN 70/30 RELION (70-30) 100 UNIT/ML injection Generic drug:  insulin NPH-regular Human 20 units subcutaneous with breakfast, 35 units with dinner Please dispense Brand name Relion       Allergies:  Allergies  Allergen Reactions  . Avapro [Irbesartan] Other (See Comments)    headaches  . Codeine Nausea And Vomiting  . Lisinopril Itching    Past Medical History:  Diagnosis Date  . Abnormal Pap smear   . Asthma   . Diabetes mellitus   . Hyperlipidemia   . Hypertension   . LGSIL (low grade squamous intraepithelial dysplasia)     Past Surgical History:  Procedure Laterality Date  . ABDOMINAL HYSTERECTOMY    . bladder surgery    . COLPOSCOPY      Family History  Problem Relation Age of Onset  . Diabetes Mother   . Diabetes Maternal Grandmother   . Heart disease Neg Hx   . Hypertension Neg Hx     Social History:  reports that she has never smoked. She has never used smokeless tobacco. She reports that she does not drink alcohol or use drugs.    Review of Systems   EDEMA: She has a history of significant edema, now  taking only 40 mg Lasix  Hypertension:    She is taking ? Atacand 16 mg, supposed to change to Benicar Also taking Lasix for edema  She has not checked her blood  pressure at home   BP Readings from Last 3 Encounters:  04/27/17 (!) 146/82  03/21/17 118/72  02/07/17 138/80   Renal functions:   Lab Results  Component Value Date   CREATININE 1.19 04/24/2017   BUN 35 (H) 04/24/2017   NA 140 04/24/2017   K 4.0 04/24/2017   CL 100 04/24/2017   CO2 29 04/24/2017        Lipids:    LDL has been previously high, around 180 and She is on Lipitor 40 mg daily  Followed by PCP        Lab Results  Component Value Date   CHOL 144 12/05/2016   HDL 34.20 (L) 12/05/2016   LDLCALC 114 (H) 07/07/2016   LDLDIRECT 89.0 12/05/2016   TRIG 239.0 (H) 12/05/2016   CHOLHDL 4 12/05/2016                Diabetic foot exam last done in 4/74 by PCP  Complications from diabetes: Proliferative retinopathy, microalbuminuria   Physical Examination:  BP (!) 146/82 (BP Location: Left Arm, Patient Position: Sitting, Cuff Size: Normal)   Pulse 90   Ht 5\' 4"  (1.626 m)   Wt 232 lb (105.2 kg)   SpO2 96%   BMI 39.82 kg/m    2+ ankle edema  ASSESSMENT:  Diabetes type 2, with obesity, insulin requiring  See history of present illness for detailed discussion of his current management, blood sugar patterns and  problems identified  Her blood sugars have been better since she went on the Novolin 70/30 probably because of the longer duration of action of the regular insulin and her tendency to high readings overnight However she may be doing better on her diet since she has lost 2 pounds, however this may be related to fluid changes also  She is starting to be a little active but emphasized the need to exercise regularly  Hypertension: Blood pressure is usually controlled but she was late in taking her medication today    PLAN for diabetes:   She will need to take her insulin about 30 minutes before eating and discussed onset of action of the premixed insulin She needs to reduce her morning dose to 18 units to prevent potential hypoglycemia during the  day She needs to start walking regularly and explained the need to lose weight Continue metformin She will start checking blood sugars after supper and discussed targets , Explained to her that with postprandial monitoring she may be able to modify her diet better  Patient Instructions  Walk daily  More sugars after dinner at 9-10 pm  Insulin 30 min BEFORE eating  Check blood sugars on waking up  4/7  Also check blood sugars about 2 hours after a meal and do this after different meals by rotation  Recommended blood sugar levels on waking up is 90-130 and about 2 hours after meal is 130-160  Please bring your blood sugar monitor to each visit, thank you  Insulin 18 units in am      Elayne Snare 04/27/2017, 2:39 PM   Note: This office note was prepared with Dragon voice recognition system technology. Any transcriptional errors that result from this process are unintentional.

## 2017-04-27 NOTE — Patient Instructions (Addendum)
Walk daily  More sugars after dinner at 9-10 pm  Insulin 30 min BEFORE eating  Check blood sugars on waking up  4/7  Also check blood sugars about 2 hours after a meal and do this after different meals by rotation  Recommended blood sugar levels on waking up is 90-130 and about 2 hours after meal is 130-160  Please bring your blood sugar monitor to each visit, thank you  Insulin 18 units in am

## 2017-05-17 ENCOUNTER — Other Ambulatory Visit: Payer: Self-pay | Admitting: *Deleted

## 2017-05-17 MED ORDER — FUROSEMIDE 40 MG PO TABS: 40.0000 mg | ORAL_TABLET | Freq: Every day | ORAL | 1 refills | Status: DC

## 2017-06-19 ENCOUNTER — Other Ambulatory Visit: Payer: Self-pay | Admitting: Endocrinology

## 2017-06-22 ENCOUNTER — Other Ambulatory Visit (INDEPENDENT_AMBULATORY_CARE_PROVIDER_SITE_OTHER): Payer: Medicare Other

## 2017-06-22 DIAGNOSIS — Z794 Long term (current) use of insulin: Secondary | ICD-10-CM

## 2017-06-22 DIAGNOSIS — E1165 Type 2 diabetes mellitus with hyperglycemia: Secondary | ICD-10-CM | POA: Diagnosis not present

## 2017-06-22 LAB — BASIC METABOLIC PANEL
BUN: 21 mg/dL (ref 6–23)
CO2: 29 mEq/L (ref 19–32)
Calcium: 9.5 mg/dL (ref 8.4–10.5)
Chloride: 105 mEq/L (ref 96–112)
Creatinine, Ser: 1.08 mg/dL (ref 0.40–1.20)
GFR: 65.75 mL/min (ref >60.00)
Glucose, Bld: 71 mg/dL (ref 70–99)
Potassium: 3.8 mEq/L (ref 3.5–5.1)
Sodium: 142 mEq/L (ref 135–145)

## 2017-06-22 LAB — HEMOGLOBIN A1C: Hgb A1c MFr Bld: 6.9 % — ABNORMAL HIGH (ref 4.6–6.5)

## 2017-06-23 ENCOUNTER — Other Ambulatory Visit: Payer: Medicare Other

## 2017-06-27 NOTE — Progress Notes (Signed)
Patient ID: Stephanie [name], female   DOB: August 07, 1955, 62 y.o.   MRN: [id number]           Reason for Appointment: Followup of diabetes and hypertension  Referring physician: Hulan Fess  History of Present Illness:          Diagnosis: Type 2 diabetes mellitus, date of diagnosis: 2003       Past history:  She was initially treated metformin at some point glimepiride also added and not clear what her level of control was in the first few years. Had been only taking 500 mg twice a day of metformin and Amaryl increased to 4 mg twice a day several years ago In the last few years her blood sugar control has been more difficult and she has been managed by an endocrinologist Because of poor control she had been tried on Janumet and [place] but she states that she could not tolerate them because of headaches Had poor control with Amaryl and metformin and also difficulty with weight gain she had started Trulicity in 17/6160 on her initial consultation Her weight on her initial visit was 240 pounds. Her A1c had gone down to 7.1% in 73/71 with using Trulicity which she was tolerating at that time However in 2/16 she stopped taking Trulicity as it was causing nausea , subsequently did not want to go on Tanzeum Because of markedly increased sugars she has been on premixed insulin since 4/16  Recent history:   INSULIN DOSE: Novolin Mix 70/30, 18 before breakfast and 44 before supper      A1c is 6.9 which is better than 7.4 previously  Current diabetes history, management, problems and blood sugar readings:  Her A1c is relatively better  This is without much change in her insulin dose or activity level  However she has gained 8 pounds  She now says that she takes only 1 metformin instead of 2 because she does not want to take a large tablet  HIGHEST blood sugars overall are fasting but not always checking readings after meals in the afternoon and evening also  Only rarely has a high sugar  documented at bedtime or evening but not clear which readings in the evenings are after meals  She was told to take her insulin consistently BEFORE eating at least 15 to 30 minutes and she thinks she is trying to do this  However today she took her insulin and did not eat breakfast  She complains that she cannot go out to walk because of the heat and is not motivated to exercise  She is asking for weight loss medication  Does not like to take brand-name medications because of cost including Jardiance   Oral hypoglycemic drugs the patient is taking are: Metformin 1 g twice a day     Side effects from medications have been: Januvia, Onglyza apparently caused headaches, Invokana: Candidiasis   Compliance with the medical regimen: Fair    Dinner; usually at 7 pm  Glucose monitoring:  done 1 times a day or more       Glucometer: One Touch Ultra.      Blood Glucose readings by download:  Mean values apply above for all meters except median for One Touch  PRE-MEAL Fasting Lunch Dinner Bedtime Overall  Glucose range:  101-258  114-132  97-150  126-299   Mean/median:  178   120  144 145   Previous readings:  Mean values apply above for all meters except median for One  Touch  PRE-MEAL Fasting Lunch Dinner Bedtime Overall  Glucose range:  122-249  86-241  171, 300   Mean/median: 191     151+/-49   POST-MEAL  10 AM PC Lunch PC Dinner  Glucose range:  103-198    Mean/median:       Glycemic control:   Lab Results  Component Value Date   HGBA1C 6.9 (H) 06/22/2017   HGBA1C 7.4 03/21/2017   HGBA1C 7.4 (H) 12/05/2016   Lab Results  Component Value Date   MICROALBUR 23.2 (H) 12/05/2016   LDLCALC 114 (H) 07/07/2016   CREATININE 1.08 06/22/2017    Self-care: The diet that the patient has been following is: tries to limit fats usually.      Meals: 2-3 meals per day. Breakfast is cereal or scrambled eggs/sausage.   Lunch is half sandwich, yogurt and fruit; dinner usually baked  chicken, bread and vegetables, snacks are fruits or nuts. Not eating out regularly  Exercise: Minimal        Dietician visit: Most recent: 06/2015 CDE visit: 12/15              Weight history: Previous range 200-260  Wt Readings from Last 3 Encounters:  06/28/17 241 lb 9.6 oz (109.6 kg)  04/27/17 232 lb (105.2 kg)  03/21/17 234 lb 3.2 oz (106.2 kg)    Other active problems: discussed in review of systems   LABS:  Lab on 06/22/2017  Component Date Value Ref Range Status  . Hgb A1c MFr Bld 06/22/2017 6.9* 4.6 - 6.5 % Final   Glycemic Control Guidelines for People with Diabetes:Non Diabetic:  <6%Goal of Therapy: <7%Additional Action Suggested:  >8%   . Sodium 06/22/2017 142  135 - 145 mEq/L Final  . Potassium 06/22/2017 3.8  3.5 - 5.1 mEq/L Final  . Chloride 06/22/2017 105  96 - 112 mEq/L Final  . CO2 06/22/2017 29  19 - 32 mEq/L Final  . Glucose, Bld 06/22/2017 71  70 - 99 mg/dL Final  . BUN 06/22/2017 21  6 - 23 mg/dL Final  . Creatinine, Ser 06/22/2017 1.08  0.40 - 1.20 mg/dL Final  . Calcium 06/22/2017 9.5  8.4 - 10.5 mg/dL Final  . GFR 06/22/2017 65.75  >60.00 mL/min Final     Allergies as of 06/28/2017      Reactions   Avapro [irbesartan] Other (See Comments)   headaches   Codeine Nausea And Vomiting   Lisinopril Itching      Medication List        Accurate as of 06/28/17 11:30 AM. Always use your most recent med list.          atorvastatin 40 MG tablet Commonly known as:  LIPITOR Take 1 tablet (40 mg total) by mouth daily.   atorvastatin 40 MG tablet Commonly known as:  LIPITOR TAKE 1 TABLET BY MOUTH ONCE DAILY   furosemide 40 MG tablet Commonly known as:  LASIX Take 1 tablet (40 mg total) by mouth daily.   glucose blood test strip Commonly known as:  ONE TOUCH ULTRA TEST USE THREE TIMES DAILY TO CHECK BLOOD SUGAR   hydrocortisone valerate cream 0.2 % Commonly known as:  WESTCORT   Insulin Pen Needle 32G X 4 MM Misc Commonly known as:  BD PEN  NEEDLE NANO U/F Use 2 per day   INSULIN SYRINGE 1CC/30GX5/16" 30G X 5/16" 1 ML Misc Use for Novolog 70/30   metFORMIN 1000 MG tablet Commonly known as:  GLUCOPHAGE Take 1 tablet (1,000  mg total) 2 (two) times daily with a meal by mouth.   NOVOLIN 70/30 RELION (70-30) 100 UNIT/ML injection Generic drug:  insulin NPH-regular Human 20 units subcutaneous with breakfast, 35 units with dinner Please dispense Brand name Relion   olmesartan 20 MG tablet Commonly known as:  BENICAR Take 1 tablet (20 mg total) by mouth daily.       Allergies:  Allergies  Allergen Reactions  . Avapro [Irbesartan] Other (See Comments)    headaches  . Codeine Nausea And Vomiting  . Lisinopril Itching    Past Medical History:  Diagnosis Date  . Abnormal Pap smear   . Asthma   . Diabetes mellitus   . Hyperlipidemia   . Hypertension   . LGSIL (low grade squamous intraepithelial dysplasia)     Past Surgical History:  Procedure Laterality Date  . ABDOMINAL HYSTERECTOMY    . bladder surgery    . COLPOSCOPY      Family History  Problem Relation Age of Onset  . Diabetes Mother   . Diabetes Maternal Grandmother   . Heart disease Neg Hx   . Hypertension Neg Hx     Social History:  reports that she has never smoked. She has never used smokeless tobacco. She reports that she does not drink alcohol or use drugs.    Review of Systems   EDEMA: She has a history of significant edema, now  taking 40 mg Lasix  Hypertension:    She is taking ?  Benicar 20 mg daily However even though she thinks she is taking this there is no current prescription on file  Has not taken this today  Also taking Lasix for edema  She has not checked her blood pressure at home recently, she checks only when she has a headache  BP Readings from Last 3 Encounters:  06/28/17 (!) 160/88  04/27/17 (!) 146/82  03/21/17 118/72   Renal functions:   Lab Results  Component Value Date   CREATININE 1.08 06/22/2017    BUN 21 06/22/2017   NA 142 06/22/2017   K 3.8 06/22/2017   CL 105 06/22/2017   CO2 29 06/22/2017        Lipids:    LDL has been previously high, around 180 and She is on Lipitor 40 mg daily  Followed by PCP        Lab Results  Component Value Date   CHOL 144 12/05/2016   HDL 34.20 (L) 12/05/2016   LDLCALC 114 (H) 07/07/2016   LDLDIRECT 89.0 12/05/2016   TRIG 239.0 (H) 12/05/2016   CHOLHDL 4 12/05/2016                Diabetic foot exam last done in 4/09 by PCP  Complications from diabetes: Proliferative retinopathy, microalbuminuria   Physical Examination:  BP (!) 160/88 (BP Location: Left Arm, Patient Position: Sitting, Cuff Size: Large)   Pulse 88   Ht 5\' 4"  (1.626 m)   Wt 241 lb 9.6 oz (109.6 kg)   SpO2 98%   BMI 41.47 kg/m    1+ ankle edema  ASSESSMENT:  Diabetes type 2, with obesity, insulin requiring  See history of present illness for detailed discussion of his current management, blood sugar patterns and problems identified  Her A1c is 6.9 This is despite her blood sugars averaging over 150 at home She has mostly high fasting readings and likely to be because of her using premixed insulin at suppertime Blood sugars are not consistently high after supper  Using less insulin in the morning with fairly good control of blood sugars during the day  Her main difficulty is with weight gain recently She can probably do better with diet and is also not motivated to exercise She does not want to see the dietitian and discussed that weight loss medications are not appropriate for various reasons including cost  Hypertension: Blood pressure is higher than usual and not clear if she is taking her Benicar, will need to check with her pharmacy on this     PLAN for diabetes:   No change in insulin She has exercise videos at home that she will start using  HYPERTENSION: We will follow-up in 1 month  Patient Instructions  Exercise tapes for home use  Check  blood sugars on waking up  3/7  Also check blood sugars about 2 hours after a meal and do this after different meals by rotation  Recommended blood sugar levels on waking up is 90-130 and about 2 hours after meal is 130-160  Please bring your blood sugar monitor to each visit, thank you  Metformin 1 at lunch and 1 at dinner  Call if low  Check BP daily     Elayne Snare 06/28/2017, 11:30 AM   Note: This office note was prepared with Dragon voice recognition system technology. Any transcriptional errors that result from this process are unintentional.

## 2017-06-28 ENCOUNTER — Encounter: Payer: Self-pay | Admitting: Endocrinology

## 2017-06-28 ENCOUNTER — Ambulatory Visit (INDEPENDENT_AMBULATORY_CARE_PROVIDER_SITE_OTHER): Payer: Medicare Other | Admitting: Endocrinology

## 2017-06-28 ENCOUNTER — Telehealth: Payer: Self-pay

## 2017-06-28 ENCOUNTER — Ambulatory Visit: Payer: Medicare Other | Admitting: Endocrinology

## 2017-06-28 VITALS — BP 160/88 | HR 88 | Ht 64.0 in | Wt 241.6 lb

## 2017-06-28 DIAGNOSIS — Z794 Long term (current) use of insulin: Secondary | ICD-10-CM | POA: Diagnosis not present

## 2017-06-28 DIAGNOSIS — E1165 Type 2 diabetes mellitus with hyperglycemia: Secondary | ICD-10-CM | POA: Diagnosis not present

## 2017-06-28 MED ORDER — OLMESARTAN MEDOXOMIL 20 MG PO TABS: 20.0000 mg | ORAL_TABLET | Freq: Every day | ORAL | 0 refills | Status: DC

## 2017-06-28 NOTE — Telephone Encounter (Signed)
PT called and notified of order change and pt verbalized understanding. Order was called into pharmacy. PT stated that she will pick up Rx.

## 2017-06-28 NOTE — Patient Instructions (Addendum)
Exercise tapes for home use  Check blood sugars on waking up  3/7  Also check blood sugars about 2 hours after a meal and do this after different meals by rotation  Recommended blood sugar levels on waking up is 90-130 and about 2 hours after meal is 130-160  Please bring your blood sugar monitor to each visit, thank you  Metformin 1 at lunch and 1 at dinner  Call if low  Check BP daily

## 2017-06-28 NOTE — Telephone Encounter (Signed)
Please check with pharmacy if she can have valsartan 160 mg daily instead and let patient know we do not think she is taking her medication regularly

## 2017-06-28 NOTE — Telephone Encounter (Signed)
According to the pharmacy pt uses, she should have run out of the Olmesartan on approx 06/13/17. Last refill was on 03/16/17. Pharmacy said it is on back order though.

## 2017-08-02 DIAGNOSIS — R809 Proteinuria, unspecified: Secondary | ICD-10-CM | POA: Diagnosis not present

## 2017-08-02 DIAGNOSIS — Z6841 Body Mass Index (BMI) 40.0 and over, adult: Secondary | ICD-10-CM | POA: Diagnosis not present

## 2017-08-02 DIAGNOSIS — R601 Generalized edema: Secondary | ICD-10-CM | POA: Diagnosis not present

## 2017-08-02 DIAGNOSIS — R609 Edema, unspecified: Secondary | ICD-10-CM | POA: Diagnosis not present

## 2017-08-04 ENCOUNTER — Other Ambulatory Visit (HOSPITAL_COMMUNITY): Payer: Self-pay | Admitting: Family Medicine

## 2017-08-04 ENCOUNTER — Ambulatory Visit (HOSPITAL_COMMUNITY)
Admission: RE | Admit: 2017-08-04 | Discharge: 2017-08-04 | Disposition: A | Discharge status: Home / Self Care | Payer: Medicare Other | Source: Ambulatory Visit | Attending: Family Medicine | Admitting: Family Medicine

## 2017-08-04 DIAGNOSIS — R609 Edema, unspecified: Secondary | ICD-10-CM | POA: Diagnosis not present

## 2017-08-04 DIAGNOSIS — I119 Hypertensive heart disease without heart failure: Secondary | ICD-10-CM | POA: Diagnosis not present

## 2017-08-04 DIAGNOSIS — E785 Hyperlipidemia, unspecified: Secondary | ICD-10-CM | POA: Insufficient documentation

## 2017-08-04 DIAGNOSIS — E119 Type 2 diabetes mellitus without complications: Secondary | ICD-10-CM | POA: Diagnosis not present

## 2017-08-04 DIAGNOSIS — Z6841 Body Mass Index (BMI) 40.0 and over, adult: Secondary | ICD-10-CM | POA: Insufficient documentation

## 2017-08-04 NOTE — Progress Notes (Signed)
Echocardiogram 2D Echocardiogram has been performed.  Stephanie [name] 08/04/2017, 10:34 AM

## 2017-08-07 DIAGNOSIS — R809 Proteinuria, unspecified: Secondary | ICD-10-CM | POA: Diagnosis not present

## 2017-08-14 ENCOUNTER — Ambulatory Visit (INDEPENDENT_AMBULATORY_CARE_PROVIDER_SITE_OTHER): Payer: Medicare Other | Admitting: Endocrinology

## 2017-08-14 ENCOUNTER — Encounter: Payer: Self-pay | Admitting: Endocrinology

## 2017-08-14 VITALS — BP 170/78 | HR 100 | Ht 64.0 in | Wt 242.4 lb

## 2017-08-14 DIAGNOSIS — R600 Localized edema: Secondary | ICD-10-CM

## 2017-08-14 DIAGNOSIS — Z794 Long term (current) use of insulin: Secondary | ICD-10-CM

## 2017-08-14 DIAGNOSIS — E1165 Type 2 diabetes mellitus with hyperglycemia: Secondary | ICD-10-CM | POA: Diagnosis not present

## 2017-08-14 DIAGNOSIS — E1129 Type 2 diabetes mellitus with other diabetic kidney complication: Secondary | ICD-10-CM | POA: Diagnosis not present

## 2017-08-14 DIAGNOSIS — I10 Essential (primary) hypertension: Secondary | ICD-10-CM

## 2017-08-14 DIAGNOSIS — R809 Proteinuria, unspecified: Secondary | ICD-10-CM

## 2017-08-14 MED ORDER — FUROSEMIDE 40 MG PO TABS: 80.0000 mg | ORAL_TABLET | Freq: Every day | ORAL | 0 refills | Status: DC

## 2017-08-14 MED ORDER — POTASSIUM CHLORIDE CRYS ER 10 MEQ PO TBCR: 10.0000 meq | EXTENDED_RELEASE_TABLET | Freq: Every day | ORAL | 1 refills | Status: DC

## 2017-08-14 NOTE — Patient Instructions (Addendum)
Insulin 48 units at supper and take 1 Metformin at supper also  Take insuin 15 min before meals  Walk, walking tapes  Take 2 Lasix in am daily  Check blood sugars on waking up  3/7 days  Also check blood sugars about 2 hours after a meal and do this after different meals by rotation  Recommended blood sugar levels on waking up is 90-130 and about 2 hours after meal is 130-160  Please bring your blood sugar monitor to each visit, thank you

## 2017-08-14 NOTE — Progress Notes (Signed)
Patient ID: Stephanie [name], female   DOB: 1955/08/15, 62 y.o.   MRN: [id number]           Reason for Appointment: Followup of diabetes and hypertension  Referring physician: Hulan Fess  History of Present Illness:          Diagnosis: Type 2 diabetes mellitus, date of diagnosis: 2003       Past history:  She was initially treated metformin at some point glimepiride also added and not clear what her level of control was in the first few years. Had been only taking 500 mg twice a day of metformin and Amaryl increased to 4 mg twice a day several years ago In the last few years her blood sugar control has been more difficult and she has been managed by an endocrinologist Because of poor control she had been tried on Janumet and [place] but she states that she could not tolerate them because of headaches Had poor control with Amaryl and metformin and also difficulty with weight gain she had started Trulicity in 95/2841 on her initial consultation Her weight on her initial visit was 240 pounds. Her A1c had gone down to 7.1% in 32/44 with using Trulicity which she was tolerating at that time However in 2/16 she stopped taking Trulicity as it was causing nausea , subsequently did not want to go on Tanzeum Because of markedly increased sugars she has been on premixed insulin since 4/16  Recent history:   INSULIN DOSE: Novolin Mix 70/30, 18 before breakfast and 44 supper    A1c was last 6.9 which is better than 7.4 previously  Current diabetes history, management, problems and blood sugar readings:  Her blood sugars at home in the mornings are higher than before and not clear why  Also appears that she is not taking her metformin in the evening at dinnertime and not clear how long she has been not doing so  She is still not motivated to exercise as discussed on each visit even though she is complaining about her weight gain  She checks blood sugars very sporadically in the evenings and  has variable readings, also difficult to know which evening readings are after supper which she usually is eating later than 7-8 PM  Blood sugars midday appear to be relatively better  Although she thinks she is trying to take her insulin BEFORE eating this may not be 30 minutes in the evening at suppertime  She is wanting to continue with Novolin instead of NovoLog mix insulin and not use any brand-name insulin or other products   Oral hypoglycemic drugs the patient is taking are: Metformin 1 g once a day     Side effects from medications have been: Januvia, Onglyza apparently caused headaches, Invokana: Candidiasis   Compliance with the medical regimen: Fair    Dinner; usually at 7 pm  Glucose monitoring:  done 1 times a day or more       Glucometer: One Touch Ultra.      Blood Glucose readings by download:  FASTING range 170-322 with MEDIAN 211 Nonfasting range 113-294, highest sugar at 9 PM  Overall MEDIAN 203, previously 145  Glycemic control:   Lab Results  Component Value Date   HGBA1C 6.9 (H) 06/22/2017   HGBA1C 7.4 03/21/2017   HGBA1C 7.4 (H) 12/05/2016   Lab Results  Component Value Date   MICROALBUR 23.2 (H) 12/05/2016   LDLCALC 114 (H) 07/07/2016   CREATININE 1.08 06/22/2017    Self-care:  The diet that the patient has been following is: tries to limit fats usually.      Meals: 2-3 meals per day. Breakfast is cereal or scrambled eggs/sausage.   Lunch is half sandwich, yogurt and fruit; dinner usually baked chicken, bread and vegetables, snacks are fruits or nuts. Not eating out regularly  Exercise: Minimal        Dietician visit: Most recent: 06/2015 CDE visit: 12/15              Weight history: Previous range 200-260  Wt Readings from Last 3 Encounters:  08/14/17 242 lb 6.4 oz (110 kg)  06/28/17 241 lb 9.6 oz (109.6 kg)  04/27/17 232 lb (105.2 kg)    Other active problems: discussed in review of systems   LABS:  No visits with results within 1  Week(s) from this visit.  Latest known visit with results is:  Lab on 06/22/2017  Component Date Value Ref Range Status  . Hgb A1c MFr Bld 06/22/2017 6.9* 4.6 - 6.5 % Final   Glycemic Control Guidelines for People with Diabetes:Non Diabetic:  <6%Goal of Therapy: <7%Additional Action Suggested:  >8%   . Sodium 06/22/2017 142  135 - 145 mEq/L Final  . Potassium 06/22/2017 3.8  3.5 - 5.1 mEq/L Final  . Chloride 06/22/2017 105  96 - 112 mEq/L Final  . CO2 06/22/2017 29  19 - 32 mEq/L Final  . Glucose, Bld 06/22/2017 71  70 - 99 mg/dL Final  . BUN 06/22/2017 21  6 - 23 mg/dL Final  . Creatinine, Ser 06/22/2017 1.08  0.40 - 1.20 mg/dL Final  . Calcium 06/22/2017 9.5  8.4 - 10.5 mg/dL Final  . GFR 06/22/2017 65.75  >60.00 mL/min Final     Allergies as of 08/14/2017      Reactions   Avapro [irbesartan] Other (See Comments)   headaches   Codeine Nausea And Vomiting   Lisinopril Itching      Medication List        Accurate as of 08/14/17  4:57 PM. Always use your most recent med list.          atorvastatin 40 MG tablet Commonly known as:  LIPITOR TAKE 1 TABLET BY MOUTH ONCE DAILY   furosemide 40 MG tablet Commonly known as:  LASIX Take 1 tablet (40 mg total) by mouth daily.   glucose blood test strip Commonly known as:  ONE TOUCH ULTRA TEST USE THREE TIMES DAILY TO CHECK BLOOD SUGAR   hydrocortisone valerate cream 0.2 % Commonly known as:  WESTCORT   insulin NPH-regular Human (70-30) 100 UNIT/ML injection Commonly known as:  NOVOLIN 70/30 Inject into the skin. INJECT 18 UNITS UNDER THE SKIN IN THE MORNING BEFORE BREAKFAST AND 44 UNITS BEFORE DINNER.   Insulin Pen Needle 32G X 4 MM Misc Commonly known as:  BD PEN NEEDLE NANO U/F Use 2 per day   INSULIN SYRINGE 1CC/30GX5/16" 30G X 5/16" 1 ML Misc Use for Novolog 70/30   metFORMIN 1000 MG tablet Commonly known as:  GLUCOPHAGE Take 1 tablet (1,000 mg total) 2 (two) times daily with a meal by mouth.   olmesartan 20 MG  tablet Commonly known as:  BENICAR Take 1 tablet (20 mg total) by mouth daily.       Allergies:  Allergies  Allergen Reactions  . Avapro [Irbesartan] Other (See Comments)    headaches  . Codeine Nausea And Vomiting  . Lisinopril Itching    Past Medical History:  Diagnosis Date  .  Abnormal Pap smear   . Asthma   . Diabetes mellitus   . Hyperlipidemia   . Hypertension   . LGSIL (low grade squamous intraepithelial dysplasia)     Past Surgical History:  Procedure Laterality Date  . ABDOMINAL HYSTERECTOMY    . bladder surgery    . COLPOSCOPY      Family History  Problem Relation Age of Onset  . Diabetes Mother   . Diabetes Maternal Grandmother   . Heart disease Neg Hx   . Hypertension Neg Hx     Social History:  reports that she has never smoked. She has never used smokeless tobacco. She reports that she does not drink alcohol or use drugs.    Review of Systems   EDEMA: She has a history of significant edema, taking 40 mg Lasix without relief Her PCP wants her to see a nephrologist because of positive protein on urine dipstick, last microalbumin was borderline high  Hypertension:   She is taking  Benicar 20 mg daily Has not taken this today and appears to be anxious also  She has checked her blood pressure at home, today 143/78 after waking up  BP Readings from Last 3 Encounters:  08/14/17 (!) 170/78  06/28/17 (!) 160/88  04/27/17 (!) 146/82   Renal functions:   Lab Results  Component Value Date   CREATININE 1.08 06/22/2017   BUN 21 06/22/2017   NA 142 06/22/2017   K 3.8 06/22/2017   CL 105 06/22/2017   CO2 29 06/22/2017        Lipids:    LDL has been previously high, around 180 and She is on Lipitor 40 mg daily  Followed by PCP, no recent labs available        Lab Results  Component Value Date   CHOL 144 12/05/2016   HDL 34.20 (L) 12/05/2016   LDLCALC 114 (H) 07/07/2016   LDLDIRECT 89.0 12/05/2016   TRIG 239.0 (H) 12/05/2016   CHOLHDL  4 12/05/2016                Diabetic foot exam last done in 5/17 by PCP  No history of hypothyroidism, last TSH in 7/19 by PCP normal  Complications from diabetes: Proliferative retinopathy, microalbuminuria   Physical Examination:  BP (!) 170/78 (BP Location: Left Arm, Patient Position: Sitting, Cuff Size: Normal)   Pulse 100   Ht 5\' 4"  (1.626 m)   Wt 242 lb 6.4 oz (110 kg)   SpO2 98%   BMI 41.61 kg/m    2+ ankle and lower leg edema present  ASSESSMENT:  Diabetes type 2, with obesity, insulin requiring  See history of present illness for detailed discussion of his current management, blood sugar patterns and problems identified  Her A1c previously was 6.9 but now her blood sugars are averaging over 200 especially fasting  She does need more basal insulin but with 70/30 insulin difficult to adjust the evening dose without potentially causing late night hypoglycemia She also can take metformin twice a day which she is not doing Also still not motivated to exercise or be consistent with diet and continues to be morbidly obese  She does not want to see the dietitian as discussed again Discussed that she only has some diabetic complications and does need better control  Hypertension: Blood pressure is higher than usual and not clear if she is taking her Benicar, will need to check with her pharmacy on this    PLAN for diabetes:  Increase evening insulin to 48 units daily She needs to take her evening insulin at least 15 to 30 minutes before eating consistently Start checking blood sugars more consistently after supper, she can rotate when she checks her sugar She needs to take her METFORMIN twice a day She  needs to walk or use her exercise videos at home Although she can benefit from [place] she will not want to use this because of the cost  HYPERTENSION: She will check her blood pressure more regularly at home Continue same medications as she reportedly had a  fairly good blood pressure at home Also will increase her LASIX to 2 tablets daily which will help with reduced sodium overload and improve blood pressure  Mild proteinuria: Likely to be from continued poor diabetes control and inconsistent blood pressure control Does need to have microalbumin checked for precise measurement  Patient Instructions  Insulin 48 units at supper and take 1 Metformin at supper also  Take insuin 15 min before meals  Walk, walking tapes  Take 2 Lasix in am daily  Check blood sugars on waking up  3/7 days  Also check blood sugars about 2 hours after a meal and do this after different meals by rotation  Recommended blood sugar levels on waking up is 90-130 and about 2 hours after meal is 130-160  Please bring your blood sugar monitor to each visit, thank you    Counseling time on subjects discussed in assessment and plan sections is over 50% of today's 25 minute visit    Elayne Snare 08/14/2017, 4:57 PM   Note: This office note was prepared with Dragon voice recognition system technology. Any transcriptional errors that result from this process are unintentional.

## 2017-08-25 ENCOUNTER — Other Ambulatory Visit: Payer: Self-pay | Admitting: Endocrinology

## 2017-08-28 ENCOUNTER — Other Ambulatory Visit: Payer: Self-pay

## 2017-08-28 MED ORDER — ATORVASTATIN CALCIUM 40 MG PO TABS: 40.0000 mg | ORAL_TABLET | Freq: Every day | ORAL | 3 refills | Status: DC

## 2017-09-28 DIAGNOSIS — J209 Acute bronchitis, unspecified: Secondary | ICD-10-CM | POA: Diagnosis not present

## 2017-10-18 ENCOUNTER — Other Ambulatory Visit: Payer: Medicare Other

## 2017-10-19 ENCOUNTER — Other Ambulatory Visit: Payer: Medicare Other

## 2017-10-20 ENCOUNTER — Ambulatory Visit: Payer: Medicare Other | Admitting: Endocrinology

## 2017-10-26 ENCOUNTER — Other Ambulatory Visit: Payer: Self-pay | Admitting: Endocrinology

## 2017-10-27 ENCOUNTER — Other Ambulatory Visit: Payer: Self-pay | Admitting: Endocrinology

## 2017-10-31 ENCOUNTER — Other Ambulatory Visit (INDEPENDENT_AMBULATORY_CARE_PROVIDER_SITE_OTHER): Payer: Medicare HMO

## 2017-10-31 DIAGNOSIS — Z794 Long term (current) use of insulin: Secondary | ICD-10-CM

## 2017-10-31 DIAGNOSIS — E1165 Type 2 diabetes mellitus with hyperglycemia: Secondary | ICD-10-CM

## 2017-10-31 LAB — COMPREHENSIVE METABOLIC PANEL
ALT: 19 U/L (ref 0–35)
AST: 17 U/L (ref 0–37)
Albumin: 4 g/dL (ref 3.5–5.2)
Alkaline Phosphatase: 68 U/L (ref 39–117)
BUN: 28 mg/dL — ABNORMAL HIGH (ref 6–23)
CO2: 27 mEq/L (ref 19–32)
Calcium: 9.8 mg/dL (ref 8.4–10.5)
Chloride: 102 mEq/L (ref 96–112)
Creatinine, Ser: 1.27 mg/dL — ABNORMAL HIGH (ref 0.40–1.20)
GFR: 54.47 mL/min — ABNORMAL LOW (ref >60.00)
Glucose, Bld: 222 mg/dL — ABNORMAL HIGH (ref 70–99)
Potassium: 4 mEq/L (ref 3.5–5.1)
Sodium: 139 mEq/L (ref 135–145)
Total Bilirubin: 0.5 mg/dL (ref 0.2–1.2)
Total Protein: 7.8 g/dL (ref 6.0–8.3)

## 2017-10-31 LAB — LIPID PANEL
Cholesterol: 178 mg/dL (ref 0–200)
HDL: 48.7 mg/dL (ref >39.00)
LDL Cholesterol: 109 mg/dL — ABNORMAL HIGH (ref 0–99)
NonHDL: 128.98
Total CHOL/HDL Ratio: 4
Triglycerides: 101 mg/dL (ref 0.0–149.0)
VLDL: 20.2 mg/dL (ref 0.0–40.0)

## 2017-10-31 LAB — MICROALBUMIN / CREATININE URINE RATIO
Creatinine,U: 161.2 mg/dL
Microalb Creat Ratio: 36.7 mg/g — ABNORMAL HIGH (ref 0.0–30.0)
Microalb, Ur: 59.1 mg/dL — ABNORMAL HIGH (ref 0.0–1.9)

## 2017-10-31 LAB — HEMOGLOBIN A1C: Hgb A1c MFr Bld: 10.3 % — ABNORMAL HIGH (ref 4.6–6.5)

## 2017-11-01 ENCOUNTER — Encounter: Payer: Self-pay | Admitting: Endocrinology

## 2017-11-01 ENCOUNTER — Ambulatory Visit (INDEPENDENT_AMBULATORY_CARE_PROVIDER_SITE_OTHER): Payer: Medicare HMO | Admitting: Endocrinology

## 2017-11-01 VITALS — BP 152/88 | HR 102 | Wt 243.0 lb

## 2017-11-01 DIAGNOSIS — I10 Essential (primary) hypertension: Secondary | ICD-10-CM

## 2017-11-01 DIAGNOSIS — E1165 Type 2 diabetes mellitus with hyperglycemia: Secondary | ICD-10-CM

## 2017-11-01 DIAGNOSIS — Z794 Long term (current) use of insulin: Secondary | ICD-10-CM

## 2017-11-01 MED ORDER — VALSARTAN 160 MG PO TABS: 160.0000 mg | ORAL_TABLET | Freq: Every day | ORAL | 0 refills | Status: DC

## 2017-11-01 NOTE — Progress Notes (Signed)
Patient ID: Stephanie [name], female   DOB: December 27, 1955, 62 y.o.   MRN: [id number]           Reason for Appointment: Followup of diabetes and hypertension  Referring physician: Hulan Fess  History of Present Illness:          Diagnosis: Type 2 diabetes mellitus, date of diagnosis: 2003       Past history:  She was initially treated metformin at some point glimepiride also added and not clear what her level of control was in the first few years. Had been only taking 500 mg twice a day of metformin and Amaryl increased to 4 mg twice a day several years ago In the last few years her blood sugar control has been more difficult and she has been managed by an endocrinologist Because of poor control she had been tried on Janumet and [place] but she states that she could not tolerate them because of headaches Had poor control with Amaryl and metformin and also difficulty with weight gain she had started Trulicity in 23/5361 on her initial consultation Her weight on her initial visit was 240 pounds. Her A1c had gone down to 7.1% in 44/31 with using Trulicity which she was tolerating at that time However in 2/16 she stopped taking Trulicity as it was causing nausea , subsequently did not want to go on [place]um Because of markedly increased sugars she has been on premixed insulin since 4/16  Recent history:   INSULIN DOSE: NovoLog mix 70/30, 18 before breakfast and 44 supper   Oral hypoglycemic drugs the patient is taking are: Metformin 1 g once a day   A1c was last 6.9 but now up to 10.3  Current diabetes history, management, problems and blood sugar readings:  She stopped taking her insulin probably about a month or more ago because of not being able to afford her co-pay  With this her blood sugars have been over 400 at times  Again she is checking her blood sugars very sporadically and more in the morning  However she has not been symptomatic with high sugars or losing weight  She  just started back on the Novolog Mix yesterday and is preferring the insulin pen instead of the syringe; she thinks her new insurance will cover her insulin better  She is still not taking her metformin twice a day as directed  Does not do any formal exercise  Although she has seen the dietitian previously she is not planning her meals and refuses to see the dietitian as suggested today      Side effects from medications have been: Januvia, Onglyza apparently caused headaches, Invokana: Candidiasis   Compliance with the medical regimen: Fair    Dinner; usually at 7 pm  Glucose monitoring:  done 1 times a day or more       Glucometer: One Touch Ultra.      Blood Glucose readings by download:   PRE-MEAL Fasting Lunch  evening Bedtime Overall  Glucose range:  170-432   193-324    Mean/median:  260     316   POST-MEAL PC Breakfast PC Lunch PC Dinner  Glucose range:  ?   Mean/median:         Glycemic control:   Lab Results  Component Value Date   HGBA1C 10.3 (H) 10/31/2017   HGBA1C 6.9 (H) 06/22/2017   HGBA1C 7.4 03/21/2017   Lab Results  Component Value Date   MICROALBUR 59.1 (H) 10/31/2017   [place]  109 (H) 10/31/2017   CREATININE 1.27 (H) 10/31/2017    Self-care: The diet that the patient has been following is: tries to limit fats usually.      Meals: 2-3 meals per day. Breakfast is cereal or scrambled eggs/sausage.   Lunch is half sandwich, yogurt and fruit; dinner usually baked chicken, bread and vegetables, snacks are fruits or nuts. Not eating out regularly  Exercise: Minimal        Dietician visit: Most recent: 06/2015 CDE visit: 12/15              Weight history: Previous range 200-260  Wt Readings from Last 3 Encounters:  11/01/17 243 lb (110.2 kg)  08/14/17 242 lb 6.4 oz (110 kg)  06/28/17 241 lb 9.6 oz (109.6 kg)    Other active problems: discussed in review of systems   LABS:  Lab on 10/31/2017  Component Date Value Ref Range Status  .  Microalb, Ur 10/31/2017 59.1* 0.0 - 1.9 mg/dL Final  . Creatinine,U 10/31/2017 161.2  mg/dL Final  . Microalb Creat Ratio 10/31/2017 36.7* 0.0 - 30.0 mg/g Final  . Cholesterol 10/31/2017 178  0 - 200 mg/dL Final   ATP III Classification       Desirable:  < 200 mg/dL               Borderline High:  200 - 239 mg/dL          High:  > = 240 mg/dL  . Triglycerides 10/31/2017 101.0  0.0 - 149.0 mg/dL Final   Normal:  <150 mg/dLBorderline High:  150 - 199 mg/dL  . HDL 10/31/2017 48.70  >39.00 mg/dL Final  . VLDL 10/31/2017 20.2  0.0 - 40.0 mg/dL Final  . LDL Cholesterol 10/31/2017 109* 0 - 99 mg/dL Final  . Total CHOL/HDL Ratio 10/31/2017 4   Final                  Men          Women1/2 Average Risk     3.4          3.3Average Risk          5.0          4.42X Average Risk          9.6          7.13X Average Risk          15.0          11.0                      . NonHDL 10/31/2017 128.98   Final   NOTE:  Non-HDL goal should be 30 mg/dL higher than patient's LDL goal (i.e. LDL goal of < 70 mg/dL, would have non-HDL goal of < 100 mg/dL)  . Sodium 10/31/2017 139  135 - 145 mEq/L Final  . Potassium 10/31/2017 4.0  3.5 - 5.1 mEq/L Final  . Chloride 10/31/2017 102  96 - 112 mEq/L Final  . CO2 10/31/2017 27  19 - 32 mEq/L Final  . Glucose, Bld 10/31/2017 222* 70 - 99 mg/dL Final  . BUN 10/31/2017 28* 6 - 23 mg/dL Final  . Creatinine, Ser 10/31/2017 1.27* 0.40 - 1.20 mg/dL Final  . Total Bilirubin 10/31/2017 0.5  0.2 - 1.2 mg/dL Final  . Alkaline Phosphatase 10/31/2017 68  39 - 117 U/L Final  . AST 10/31/2017 17  0 - 37 U/L Final  . ALT 10/31/2017  19  0 - 35 U/L Final  . Total Protein 10/31/2017 7.8  6.0 - 8.3 g/dL Final  . Albumin 10/31/2017 4.0  3.5 - 5.2 g/dL Final  . Calcium 10/31/2017 9.8  8.4 - 10.5 mg/dL Final  . GFR 10/31/2017 54.47* >60.00 mL/min Final  . Hgb A1c MFr Bld 10/31/2017 10.3* 4.6 - 6.5 % Final   Glycemic Control Guidelines for People with Diabetes:Non Diabetic:  <6%Goal of Therapy:  <7%Additional Action Suggested:  >8%      Allergies as of 11/01/2017      Reactions   Avapro [irbesartan] Other (See Comments)   headaches   Codeine Nausea And Vomiting   Lisinopril Itching      Medication List        Accurate as of 11/01/17 10:03 AM. Always use your most recent med list.          atorvastatin 40 MG tablet Commonly known as:  LIPITOR Take 1 tablet (40 mg total) by mouth daily.   furosemide 40 MG tablet Commonly known as:  LASIX TAKE 2 TABLETS BY MOUTH ONCE DAILY   glucose blood test strip USE THREE TIMES DAILY TO CHECK BLOOD SUGAR   hydrocortisone valerate cream 0.2 % Commonly known as:  WESTCORT   insulin NPH-regular Human (70-30) 100 UNIT/ML injection Commonly known as:  NOVOLIN 70/30 Inject into the skin. INJECT 18 UNITS UNDER THE SKIN IN THE MORNING BEFORE BREAKFAST AND 44 UNITS BEFORE DINNER.   metFORMIN 1000 MG tablet Commonly known as:  GLUCOPHAGE TAKE 1 TABLET BY MOUTH TWICE DAILY WITH A MEAL   olmesartan 20 MG tablet Commonly known as:  BENICAR Take 1 tablet (20 mg total) by mouth daily.   potassium chloride 10 MEQ tablet Commonly known as:  K-DUR,KLOR-CON Take 1 tablet (10 mEq total) by mouth daily.   RELION PEN NEEDLES 32G X 4 MM Misc Generic drug:  Insulin Pen Needle USE  TWICE DAILY       Allergies:  Allergies  Allergen Reactions  . Avapro [Irbesartan] Other (See Comments)    headaches  . Codeine Nausea And Vomiting  . Lisinopril Itching    Past Medical History:  Diagnosis Date  . Abnormal Pap smear   . Asthma   . Diabetes mellitus   . Hyperlipidemia   . Hypertension   . LGSIL (low grade squamous intraepithelial dysplasia)     Past Surgical History:  Procedure Laterality Date  . ABDOMINAL HYSTERECTOMY    . bladder surgery    . COLPOSCOPY      Family History  Problem Relation Age of Onset  . Diabetes Mother   . Diabetes Maternal Grandmother   . Heart disease Neg Hx   . Hypertension Neg Hx     Social  History:  reports that she has never smoked. She has never used smokeless tobacco. She reports that she does not drink alcohol or use drugs.    Review of Systems   EDEMA: She has a history of significant edema, taking 80 mg Lasix    Hypertension:   She is taking valsartan probably 160 mg instead of Benicar 20 mg daily, not clear if her prescription was done by PCP  She has checked her blood pressure at home, she thinks it is as low as 140 She is anxious today  BP Readings from Last 3 Encounters:  11/01/17 (!) 152/88  08/14/17 (!) 170/78  06/28/17 (!) 160/88   Renal functions indicate some worsening but she has not gone to see  the nephrologist as recommended by PCP:  Lab Results  Component Value Date   CREATININE 1.27 (H) 10/31/2017   CREATININE 1.08 06/22/2017   CREATININE 1.19 04/24/2017         Lipids:    LDL has been previously high, around 180 baseline She has been irregular with her Lipitor but started back only recently        Lab Results  Component Value Date   CHOL 178 10/31/2017   HDL 48.70 10/31/2017   LDLCALC 109 (H) 10/31/2017   LDLDIRECT 89.0 12/05/2016   TRIG 101.0 10/31/2017   CHOLHDL 4 10/31/2017                Diabetic foot exam last done in 5/17 by PCP  No history of hypothyroidism, last TSH in 7/19 by PCP normal  Complications from diabetes: Proliferative retinopathy, microalbuminuria   Physical Examination:  BP (!) 152/88   Pulse (!) 102   Wt 243 lb (110.2 kg)   SpO2 97%   BMI 41.71 kg/m    2+ ankle edema present  ASSESSMENT:  Diabetes type 2, with obesity, insulin requiring  See history of present illness for detailed discussion of his current management, blood sugar patterns and problems identified  Her A1c previously was 6.9 but now is up to 10.3  This is more recently because of the her not taking insulin Since she just started back on insulin yesterday not clear what her proper doses should be Also did not increase  her evening dose to 48 units as recommended or start taking metformin twice daily also She still is not motivated to try and make efforts to lose weight also  Hypertension: Blood pressure is still relatively high but she thinks this is from anxiety and has not taken her medications this morning as it  RENAL dysfunction: May be related to poor diabetes and blood pressure control recently    PLAN for diabetes:   Increase evening insulin to 48 units daily Check blood sugars at least once a day by rotation at different times Reviewed blood sugar controlled in 2 months again She will try to start walking and make more efforts to lose weight  HYPERTENSION: She will follow-up with PCP To check blood pressure at least weekly at home  There are no Patient Instructions on file for this visit.  She refuses influenza vaccine    Elayne Snare 11/01/2017, 10:03 AM   Note: This office note was prepared with Dragon voice recognition system technology. Any transcriptional errors that result from this process are unintentional.

## 2017-11-01 NOTE — Patient Instructions (Addendum)
Increase evening insulin to 48 units daily   Start checking blood sugars more consistently after supper, she can rotate when you check sugar  needs to take  METFORMIN twice a day  Check blood sugars on waking up 5 days a week  Also check blood sugars about 2 hours after meals and do this after different meals by rotation  Recommended blood sugar levels on waking up are 90-130 and about 2 hours after meal is 130-160  Please bring your blood sugar monitor to each visit, thank you

## 2017-11-24 ENCOUNTER — Other Ambulatory Visit: Payer: Self-pay | Admitting: Endocrinology

## 2017-12-28 ENCOUNTER — Other Ambulatory Visit: Payer: Self-pay

## 2017-12-28 MED ORDER — INSULIN NPH ISOPHANE & REGULAR (70-30) 100 UNIT/ML ~~LOC~~ SUSP: SUBCUTANEOUS | 3 refills | Status: DC

## 2018-01-02 ENCOUNTER — Other Ambulatory Visit: Payer: Medicare HMO

## 2018-01-02 ENCOUNTER — Other Ambulatory Visit (INDEPENDENT_AMBULATORY_CARE_PROVIDER_SITE_OTHER): Payer: Medicare HMO

## 2018-01-02 DIAGNOSIS — Z794 Long term (current) use of insulin: Secondary | ICD-10-CM | POA: Diagnosis not present

## 2018-01-02 DIAGNOSIS — E1165 Type 2 diabetes mellitus with hyperglycemia: Secondary | ICD-10-CM

## 2018-01-02 LAB — BASIC METABOLIC PANEL
BUN: 22 mg/dL (ref 6–23)
CO2: 33 mEq/L — ABNORMAL HIGH (ref 19–32)
Calcium: 9.9 mg/dL (ref 8.4–10.5)
Chloride: 97 mEq/L (ref 96–112)
Creatinine, Ser: 1.17 mg/dL (ref 0.40–1.20)
GFR: 59.84 mL/min — ABNORMAL LOW (ref >60.00)
Glucose, Bld: 183 mg/dL — ABNORMAL HIGH (ref 70–99)
Potassium: 4 mEq/L (ref 3.5–5.1)
Sodium: 139 mEq/L (ref 135–145)

## 2018-01-02 LAB — LIPID PANEL
Cholesterol: 179 mg/dL (ref 0–200)
HDL: 47.5 mg/dL (ref >39.00)
LDL Cholesterol: 110 mg/dL — ABNORMAL HIGH (ref 0–99)
NonHDL: 131.23
Total CHOL/HDL Ratio: 4
Triglycerides: 105 mg/dL (ref 0.0–149.0)
VLDL: 21 mg/dL (ref 0.0–40.0)

## 2018-01-03 LAB — FRUCTOSAMINE: Fructosamine: 314 umol/L — ABNORMAL HIGH (ref 0–285)

## 2018-01-05 ENCOUNTER — Ambulatory Visit (INDEPENDENT_AMBULATORY_CARE_PROVIDER_SITE_OTHER): Payer: Medicare HMO | Admitting: Endocrinology

## 2018-01-05 ENCOUNTER — Encounter: Payer: Self-pay | Admitting: Endocrinology

## 2018-01-05 VITALS — BP 140/90 | HR 86 | Ht 64.0 in | Wt 242.2 lb

## 2018-01-05 DIAGNOSIS — Z794 Long term (current) use of insulin: Secondary | ICD-10-CM

## 2018-01-05 DIAGNOSIS — E1165 Type 2 diabetes mellitus with hyperglycemia: Secondary | ICD-10-CM

## 2018-01-05 MED ORDER — INSULIN DEGLUDEC 100 UNIT/ML ~~LOC~~ SOPN: 20.0000 [IU] | PEN_INJECTOR | Freq: Every day | SUBCUTANEOUS | 1 refills | Status: DC

## 2018-01-05 NOTE — Patient Instructions (Signed)
Take metformin at dinner and not not in am   TRESIBA insulin:     Start with 10 units at bedtime daily and increase by 2 units every 4-5 days until the waking up sugars are under 130.  Then continue the same dose.  If blood sugar is under 90 for 2 days in a row, reduce the dose by 2 units.

## 2018-01-05 NOTE — Progress Notes (Signed)
Patient ID: Stephanie [name], female   DOB: [birth year], 62 y.o.   MRN: [id number]           Reason for Appointment: Followup of diabetes and hypertension  Referring physician: Hulan Fess  History of Present Illness:          Diagnosis: Type 2 diabetes mellitus, date of diagnosis: 2003       Past history:  She was initially treated metformin at some point glimepiride also added and not clear what her level of control was in the first few years. Had been only taking 500 mg twice a day of metformin and Amaryl increased to 4 mg twice a day several years ago In the last few years her blood sugar control has been more difficult and she has been managed by an endocrinologist Because of poor control she had been tried on Janumet and [place] but she states that she could not tolerate them because of headaches Had poor control with Amaryl and metformin and also difficulty with weight gain she had started Trulicity in 19/1478 on her initial consultation Her weight on her initial visit was 240 pounds. Her A1c had gone down to 7.1% in 29/56 with using Trulicity which she was tolerating at that time However in 2/16 she stopped taking Trulicity as it was causing nausea , subsequently did not want to go on [place] Because of markedly increased sugars she has been on premixed insulin since 4/16  Recent history:   INSULIN DOSE: NovoLog mix 70/30, 18 before breakfast and 48 supper   Oral hypoglycemic drugs the patient is taking are: Metformin 1 g once a day   A1c was last 10.3 and fructosamine now is 314  Current diabetes history, management, problems and blood sugar patterns:   Her evening insulin was increased on the last visit; also previously had run out of her insulin and now she thinks she is refilling it consistently  However even with this her blood sugars are averaging well over 200 in the morning with some variability  She was also told to take metformin twice a day and she still does  not do that, she thinks she gets side effects with taking the second dose but not GI problems  Her diet has been quite variable and not consistent and calorie content or carbohydrate  As before she is not doing any walking for exercise as discussed again  Most of her readings in the evenings are before dinner although blood sugars after dinner are not consistent  Lowest blood sugar 92, likely to be from taking her evening insulin late at night instead of at suppertime  Also she forgets sometimes to take her insulin in the evening before dinner and she thinks this is when her blood sugars go up significantly the next morning      Side effects from medications have been: Januvia, Onglyza apparently caused headaches, Invokana: Candidiasis   Compliance with the medical regimen: Fair    Dinner; usually at 7 pm  Glucose monitoring:  done 1 times a day or more       Glucometer: One Touch Ultra.      Blood Glucose readings by download:   PRE-MEAL Fasting Lunch Dinner Bedtime Overall  Glucose range:  92-346   96-240   Mean/median: 245     231     Glycemic control:   Lab Results  Component Value Date   HGBA1C 10.3 (H) 10/31/2017   HGBA1C 6.9 (H) 06/22/2017   HGBA1C 7.4  03/21/2017   Lab Results  Component Value Date   MICROALBUR 59.1 (H) 10/31/2017   LDLCALC 110 (H) 01/02/2018   CREATININE 1.17 01/02/2018    Self-care: The diet that the patient has been following is: tries to limit fats usually.      Meals: 2-3 meals per day. Breakfast is cereal or scrambled eggs/sausage.   Lunch is half sandwich, yogurt and fruit; dinner usually baked chicken, bread and vegetables, snacks are fruits or nuts. Not eating out regularly  Exercise: Minimal        Dietician visit: Most recent: 06/2015 CDE visit: 12/15              Weight history: Previous range 200-260  Wt Readings from Last 3 Encounters:  01/05/18 242 lb 3.2 oz (109.9 kg)  11/01/17 243 lb (110.2 kg)  08/14/17 242 lb 6.4 oz  (110 kg)    Other active problems: discussed in review of systems   LABS:  Lab on 01/02/2018  Component Date Value Ref Range Status  . Cholesterol 01/02/2018 179  0 - 200 mg/dL Final   ATP III Classification       Desirable:  < 200 mg/dL               Borderline High:  200 - 239 mg/dL          High:  > = 240 mg/dL  . Triglycerides 01/02/2018 105.0  0.0 - 149.0 mg/dL Final   Normal:  <150 mg/dLBorderline High:  150 - 199 mg/dL  . HDL 01/02/2018 47.50  >39.00 mg/dL Final  . VLDL 01/02/2018 21.0  0.0 - 40.0 mg/dL Final  . LDL Cholesterol 01/02/2018 110* 0 - 99 mg/dL Final  . Total CHOL/HDL Ratio 01/02/2018 4   Final                  Men          Women1/2 Average Risk     3.4          3.3Average Risk          5.0          4.42X Average Risk          9.6          7.13X Average Risk          15.0          11.0                      . NonHDL 01/02/2018 131.23   Final   NOTE:  Non-HDL goal should be 30 mg/dL higher than patient's LDL goal (i.e. LDL goal of < 70 mg/dL, would have non-HDL goal of < 100 mg/dL)  . Sodium 01/02/2018 139  135 - 145 mEq/L Final  . Potassium 01/02/2018 4.0  3.5 - 5.1 mEq/L Final  . Chloride 01/02/2018 97  96 - 112 mEq/L Final  . CO2 01/02/2018 33* 19 - 32 mEq/L Final  . Glucose, Bld 01/02/2018 183* 70 - 99 mg/dL Final  . BUN 01/02/2018 22  6 - 23 mg/dL Final  . Creatinine, Ser 01/02/2018 1.17  0.40 - 1.20 mg/dL Final  . Calcium 01/02/2018 9.9  8.4 - 10.5 mg/dL Final  . GFR 01/02/2018 59.84* >60.00 mL/min Final  . Fructosamine 01/02/2018 314* 0 - 285 umol/L Final   Comment: Published reference interval for apparently healthy subjects between age 73 and 57 is 73 - 285 umol/L and in a  poorly controlled diabetic population is 228 - 563 umol/L with a mean of 396 umol/L.      Allergies as of 01/05/2018      Reactions   Avapro [irbesartan] Other (See Comments)   headaches   Codeine Nausea And Vomiting   Lisinopril Itching      Medication List        Accurate as of January 05, 2018 11:59 PM. Always use your most recent med list.        atorvastatin 40 MG tablet Commonly known as:  LIPITOR Take 1 tablet (40 mg total) by mouth daily.   furosemide 40 MG tablet Commonly known as:  LASIX TAKE 2 TABLETS BY MOUTH ONCE DAILY   glucose blood test strip Commonly known as:  ONE TOUCH ULTRA TEST USE THREE TIMES DAILY TO CHECK BLOOD SUGAR   hydrocortisone valerate cream 0.2 % Commonly known as:  WESTCORT   insulin degludec 100 UNIT/ML Sopn FlexTouch Pen Commonly known as:  TRESIBA FLEXTOUCH Inject 0.2 mLs (20 Units total) into the skin daily.   insulin NPH-regular Human (70-30) 100 UNIT/ML injection INJECT 20 UNITS UNDER THE SKIN IN THE MORNING BEFORE BREAKFAST AND 48 UNITS BEFORE DINNER.   metFORMIN 1000 MG tablet Commonly known as:  GLUCOPHAGE TAKE 1 TABLET BY MOUTH TWICE DAILY WITH A MEAL   potassium chloride 10 MEQ tablet Commonly known as:  K-DUR,KLOR-CON Take 1 tablet (10 mEq total) by mouth daily.   RELION PEN NEEDLES 32G X 4 MM Misc Generic drug:  Insulin Pen Needle USE  TWICE DAILY   valsartan 160 MG tablet Commonly known as:  DIOVAN TAKE 1 TABLET BY MOUTH ONCE DAILY       Allergies:  Allergies  Allergen Reactions  . Avapro [Irbesartan] Other (See Comments)    headaches  . Codeine Nausea And Vomiting  . Lisinopril Itching    Past Medical History:  Diagnosis Date  . Abnormal Pap smear   . Asthma   . Diabetes mellitus   . Hyperlipidemia   . Hypertension   . LGSIL (low grade squamous intraepithelial dysplasia)     Past Surgical History:  Procedure Laterality Date  . ABDOMINAL HYSTERECTOMY    . bladder surgery    . COLPOSCOPY      Family History  Problem Relation Age of Onset  . Diabetes Mother   . Diabetes Maternal Grandmother   . Heart disease Neg Hx   . Hypertension Neg Hx     Social History:  reports that she has never smoked. She has never used smokeless tobacco. She reports that she  does not drink alcohol or use drugs.    Review of Systems   EDEMA: She has a history of significant edema, taking 80 mg Lasix    Hypertension:   She is taking valsartan probably 160 mg currently  She has checked her blood pressure at home, she thinks may be around 824 systolic She again blames her high blood pressure on not taking her Benicar today  BP Readings from Last 3 Encounters:  01/05/18 140/90  11/01/17 (!) 152/88  08/14/17 (!) 170/78   Renal function has been variable She continues to take Lasix for edema  Lab Results  Component Value Date   CREATININE 1.17 01/02/2018   CREATININE 1.27 (H) 10/31/2017   CREATININE 1.08 06/22/2017         Lipids:    LDL has been previously high, around 180 baseline She has been regular with her Lipitor but her LDL  is still over 100       Lab Results  Component Value Date   CHOL 179 01/02/2018   HDL 47.50 01/02/2018   LDLCALC 110 (H) 01/02/2018   LDLDIRECT 89.0 12/05/2016   TRIG 105.0 01/02/2018   CHOLHDL 4 01/02/2018                Diabetic foot exam last done in 5/17 by PCP  No history of hypothyroidism, last TSH in 7/19 by PCP normal  Complications from diabetes: Proliferative retinopathy, microalbuminuria  She refuses to take the flu vaccine  Physical Examination:  BP 140/90 (BP Location: Left Arm, Patient Position: Sitting, Cuff Size: Normal)   Pulse 86   Ht 5\' 4"  (1.626 m)   Wt 242 lb 3.2 oz (109.9 kg)   SpO2 97%   BMI 41.57 kg/m    1+ ankle edema present  ASSESSMENT:  Diabetes type 2, with obesity, insulin requiring  See history of present illness for detailed discussion of his current management, blood sugar patterns and problems identified  Her A1c previously was 10.3  Her blood sugars still difficult to control especially with her not wanting to take 2 separate types of insulins Her fasting readings are much higher than the rest of the day usually Also she refuses to take metformin twice a  day as suggested   Hypertension: Blood pressure is still relatively high but she thinks it is better at home  Edema: Fairly well controlled now    PLAN for diabetes:   Discussed that since blood sugars are high fasting she would benefit from adding a basal insulin to her premixed insulin This may also help her blood sugar before dinnertime Given a flowsheet to help her adjust the basal insulin We will start with 10 units of Tresiba at night and she may take this with her suppertime dose She will increase the dose by 2 units every 4 to 5 days until morning sugars are under 130 No change in premixed insulin Follow-up in 2 months  HYPERTENSION: She will follow-up with PCP She will take her blood pressure medication in the morning before coming to the office  Patient Instructions  Take metformin at dinner and not not in am   TRESIBA insulin:     Start with 10 units at bedtime daily and increase by 2 units every 4-5 days until the waking up sugars are under 130.  Then continue the same dose.  If blood sugar is under 90 for 2 days in a row, reduce the dose by 2 units.         Elayne Snare 01/07/2018, 9:00 PM   Note: This office note was prepared with Dragon voice recognition system technology. Any transcriptional errors that result from this process are unintentional.

## 2018-01-26 ENCOUNTER — Other Ambulatory Visit: Payer: Self-pay | Admitting: Endocrinology

## 2018-01-29 ENCOUNTER — Other Ambulatory Visit: Payer: Self-pay

## 2018-01-29 MED ORDER — GLUCOSE BLOOD VI STRP: ORAL_STRIP | 0 refills | Status: DC

## 2018-02-15 DIAGNOSIS — Z01419 Encounter for gynecological examination (general) (routine) without abnormal findings: Secondary | ICD-10-CM | POA: Diagnosis not present

## 2018-02-15 DIAGNOSIS — Z6841 Body Mass Index (BMI) 40.0 and over, adult: Secondary | ICD-10-CM | POA: Diagnosis not present

## 2018-02-20 ENCOUNTER — Other Ambulatory Visit: Payer: Self-pay

## 2018-02-20 MED ORDER — ATORVASTATIN CALCIUM 40 MG PO TABS: 40.0000 mg | ORAL_TABLET | Freq: Every day | ORAL | 1 refills | Status: DC

## 2018-02-20 MED ORDER — VALSARTAN 160 MG PO TABS: 160.0000 mg | ORAL_TABLET | Freq: Every day | ORAL | 1 refills | Status: DC

## 2018-03-04 ENCOUNTER — Other Ambulatory Visit: Payer: Self-pay | Admitting: Endocrinology

## 2018-03-20 ENCOUNTER — Other Ambulatory Visit (INDEPENDENT_AMBULATORY_CARE_PROVIDER_SITE_OTHER): Payer: Medicare HMO

## 2018-03-20 ENCOUNTER — Other Ambulatory Visit: Payer: Self-pay

## 2018-03-20 DIAGNOSIS — Z794 Long term (current) use of insulin: Secondary | ICD-10-CM

## 2018-03-20 DIAGNOSIS — E1165 Type 2 diabetes mellitus with hyperglycemia: Secondary | ICD-10-CM | POA: Diagnosis not present

## 2018-03-20 LAB — HEMOGLOBIN A1C: Hgb A1c MFr Bld: 7.8 % — ABNORMAL HIGH (ref 4.6–6.5)

## 2018-03-20 LAB — COMPREHENSIVE METABOLIC PANEL
ALT: 17 U/L (ref 0–35)
AST: 17 U/L (ref 0–37)
Albumin: 4.2 g/dL (ref 3.5–5.2)
Alkaline Phosphatase: 62 U/L (ref 39–117)
BUN: 36 mg/dL — ABNORMAL HIGH (ref 6–23)
CO2: 29 mEq/L (ref 19–32)
Calcium: 9.8 mg/dL (ref 8.4–10.5)
Chloride: 99 mEq/L (ref 96–112)
Creatinine, Ser: 1.41 mg/dL — ABNORMAL HIGH (ref 0.40–1.20)
GFR: 45.37 mL/min — ABNORMAL LOW (ref >60.00)
Glucose, Bld: 146 mg/dL — ABNORMAL HIGH (ref 70–99)
Potassium: 4.4 mEq/L (ref 3.5–5.1)
Sodium: 137 mEq/L (ref 135–145)
Total Bilirubin: 0.4 mg/dL (ref 0.2–1.2)
Total Protein: 8.1 g/dL (ref 6.0–8.3)

## 2018-03-23 ENCOUNTER — Other Ambulatory Visit: Payer: Self-pay

## 2018-03-23 ENCOUNTER — Ambulatory Visit (INDEPENDENT_AMBULATORY_CARE_PROVIDER_SITE_OTHER): Payer: Medicare HMO | Admitting: Endocrinology

## 2018-03-23 ENCOUNTER — Encounter: Payer: Self-pay | Admitting: Endocrinology

## 2018-03-23 VITALS — BP 160/90 | HR 107 | Ht 64.0 in | Wt 247.0 lb

## 2018-03-23 DIAGNOSIS — E1121 Type 2 diabetes mellitus with diabetic nephropathy: Secondary | ICD-10-CM | POA: Diagnosis not present

## 2018-03-23 DIAGNOSIS — I10 Essential (primary) hypertension: Secondary | ICD-10-CM

## 2018-03-23 DIAGNOSIS — E1165 Type 2 diabetes mellitus with hyperglycemia: Secondary | ICD-10-CM | POA: Diagnosis not present

## 2018-03-23 DIAGNOSIS — Z794 Long term (current) use of insulin: Secondary | ICD-10-CM | POA: Diagnosis not present

## 2018-03-23 MED ORDER — CLONIDINE 0.1 MG/24HR TD PTWK: 0.1000 mg | MEDICATED_PATCH | TRANSDERMAL | 1 refills | Status: DC

## 2018-03-23 NOTE — Progress Notes (Signed)
Patient ID: Stephanie [name], female   DOB: 14-Jul-1955, 63 y.o.   MRN: [id number]           Reason for Appointment: Followup of diabetes and hypertension  Referring physician: Hulan Fess  History of Present Illness:          Diagnosis: Type 2 diabetes mellitus, date of diagnosis: 2003       Past history:  She was initially treated metformin at some point glimepiride also added and not clear what her level of control was in the first few years. Had been only taking 500 mg twice a day of metformin and Amaryl increased to 4 mg twice a day several years ago In the last few years her blood sugar control has been more difficult and she has been managed by an endocrinologist Because of poor control she had been tried on Janumet and [place] but she states that she could not tolerate them because of headaches Had poor control with Amaryl and metformin and also difficulty with weight gain she had started Trulicity in 21/3086 on her initial consultation Her weight on her initial visit was 240 pounds. Her A1c had gone down to 7.1% in 57/84 with using Trulicity which she was tolerating at that time However in 2/16 she stopped taking Trulicity as it was causing nausea , subsequently did not want to go on [place] Because of markedly increased sugars she has been on premixed insulin since 4/16  Recent history:   INSULIN DOSE: NovoLog mix 70/30, 18 before breakfast and 48 supper   Oral hypoglycemic drugs the patient is taking are: Metformin 1 g once a day   A1c was last 10.3 and it is now 7.8   Current diabetes history, management, problems and blood sugar patterns:   She was recommended [place] on her last visit   She does have this at home she is not taking it because she thought she was concerned about the pharmacy literature given with the medication  Her blood sugars are about the same as before although generally fasting readings are about 30 mg better on an average  Wanting to go on a  low carbohydrate diet and that she is finding on the Internet  As before she takes metformin only once a day now doing this in the evening which may be helping her morning sugars  She has gained weight  Currently does not watch her carbohydrates, portions are avoid fried foods  Last night only had hotdogs for dinner  As before she forgets sometimes to take her insulin in the evening before dinner including last night  Her highest blood sugar in the morning has been 275 She does not have any motivation to do walking for exercise      Side effects from medications have been: Januvia, Onglyza apparently caused headaches, Invokana: Candidiasis   Compliance with the medical regimen: Fair    Dinner; usually at 7 pm  Glucose monitoring:  done 1 times a day or more       Glucometer: One Touch Ultra.      Blood Glucose readings by download:   PRE-MEAL Fasting Lunch Dinner Bedtime Overall  Glucose range:  147-275   152-295    Mean/median:  209   199   199+/-50   POST-MEAL PC Breakfast PC Lunch PC Dinner  Glucose range:    163, 164  Mean/median:        PRE-MEAL Fasting Lunch Dinner Bedtime Overall  Glucose range:  92-346  96-240   Mean/median: 245     231     Glycemic control:   Lab Results  Component Value Date   HGBA1C 7.8 (H) 03/20/2018   HGBA1C 10.3 (H) 10/31/2017   HGBA1C 6.9 (H) 06/22/2017   Lab Results  Component Value Date   MICROALBUR 59.1 (H) 10/31/2017   LDLCALC 110 (H) 01/02/2018   CREATININE 1.41 (H) 03/20/2018    Self-care: The diet that the patient has been following is: tries to limit fats usually.      Meals: 2-3 meals per day. Breakfast is cereal or scrambled eggs/sausage.   Lunch is half sandwich, yogurt and fruit; dinner usually baked chicken, bread and vegetables, snacks are fruits or nuts. Not eating out regularly  Exercise: Minimal        Dietician visit: Most recent: 06/2015 CDE visit: 12/15              Weight history: Previous range  200-260  Wt Readings from Last 3 Encounters:  03/23/18 247 lb (112 kg)  01/05/18 242 lb 3.2 oz (109.9 kg)  11/01/17 243 lb (110.2 kg)    Other active problems: discussed in review of systems   LABS:  Lab on 03/20/2018  Component Date Value Ref Range Status  . Sodium 03/20/2018 137  135 - 145 mEq/L Final  . Potassium 03/20/2018 4.4  3.5 - 5.1 mEq/L Final  . Chloride 03/20/2018 99  96 - 112 mEq/L Final  . CO2 03/20/2018 29  19 - 32 mEq/L Final  . Glucose, Bld 03/20/2018 146* 70 - 99 mg/dL Final  . BUN 03/20/2018 36* 6 - 23 mg/dL Final  . Creatinine, Ser 03/20/2018 1.41* 0.40 - 1.20 mg/dL Final  . Total Bilirubin 03/20/2018 0.4  0.2 - 1.2 mg/dL Final  . Alkaline Phosphatase 03/20/2018 62  39 - 117 U/L Final  . AST 03/20/2018 17  0 - 37 U/L Final  . ALT 03/20/2018 17  0 - 35 U/L Final  . Total Protein 03/20/2018 8.1  6.0 - 8.3 g/dL Final  . Albumin 03/20/2018 4.2  3.5 - 5.2 g/dL Final  . Calcium 03/20/2018 9.8  8.4 - 10.5 mg/dL Final  . GFR 03/20/2018 45.37* >60.00 mL/min Final  . Hgb A1c MFr Bld 03/20/2018 7.8* 4.6 - 6.5 % Final   Glycemic Control Guidelines for People with Diabetes:Non Diabetic:  <6%Goal of Therapy: <7%Additional Action Suggested:  >8%      Allergies as of 03/23/2018      Reactions   Avapro [irbesartan] Other (See Comments)   headaches   Codeine Nausea And Vomiting   Lisinopril Itching      Medication List       Accurate as of March 23, 2018 10:32 AM. Always use your most recent med list.        atorvastatin 40 MG tablet Commonly known as:  LIPITOR Take 1 tablet (40 mg total) by mouth daily.   furosemide 40 MG tablet Commonly known as:  LASIX TAKE 1 TABLET BY MOUTH ONCE DAILY   glucose blood test strip Commonly known as:  ONE TOUCH ULTRA TEST USE 1 STRIP TO CHECK GLUCOSE THREE TIMES DAILY TO  CHECK  BLOOD  SUGAR. DX:E11.65   hydrocortisone valerate cream 0.2 % Commonly known as:  WESTCORT   insulin NPH-regular Human (70-30) 100 UNIT/ML  injection INJECT 20 UNITS UNDER THE SKIN IN THE MORNING BEFORE BREAKFAST AND 48 UNITS BEFORE DINNER.   metFORMIN 1000 MG tablet Commonly known as:  GLUCOPHAGE TAKE 1  TABLET BY MOUTH TWICE DAILY WITH A MEAL   potassium chloride 10 MEQ tablet Commonly known as:  K-DUR,KLOR-CON TAKE 1 TABLET BY MOUTH ONCE DAILY   RELION PEN NEEDLES 32G X 4 MM Misc Generic drug:  Insulin Pen Needle USE  TWICE DAILY   valsartan 160 MG tablet Commonly known as:  DIOVAN Take 1 tablet (160 mg total) by mouth daily.       Allergies:  Allergies  Allergen Reactions  . Avapro [Irbesartan] Other (See Comments)    headaches  . Codeine Nausea And Vomiting  . Lisinopril Itching    Past Medical History:  Diagnosis Date  . Abnormal Pap smear   . Asthma   . Diabetes mellitus   . Hyperlipidemia   . Hypertension   . LGSIL (low grade squamous intraepithelial dysplasia)     Past Surgical History:  Procedure Laterality Date  . ABDOMINAL HYSTERECTOMY    . bladder surgery    . COLPOSCOPY      Family History  Problem Relation Age of Onset  . Diabetes Mother   . Diabetes Maternal Grandmother   . Heart disease Neg Hx   . Hypertension Neg Hx     Social History:  reports that she has never smoked. She has never used smokeless tobacco. She reports that she does not drink alcohol or use drugs.    Review of Systems   EDEMA: She has a history of significant edema, taking 80 mg Lasix    Hypertension:   She is taking valsartan 160 mg currently  She has checked her blood pressure at home reportedly but does not remember the readings and does not know when she last Her blood pressure is consistently high and she always blames this on not taking her medication the morning of her visit, usually taking tablet at 10 AM   BP Readings from Last 3 Encounters:  03/23/18 (!) 160/90  01/05/18 140/90  11/01/17 (!) 152/88   Renal function has been variable slightly higher now  She continues to take Lasix  for edema and also on potassium supplements  Lab Results  Component Value Date   CREATININE 1.41 (H) 03/20/2018   CREATININE 1.17 01/02/2018   CREATININE 1.27 (H) 10/31/2017        Lipids:    LDL has been previously high, around 180 baseline She has been regular with her Lipitor but her LDL is still over 100       Lab Results  Component Value Date   CHOL 179 01/02/2018   HDL 47.50 01/02/2018   LDLCALC 110 (H) 01/02/2018   LDLDIRECT 89.0 12/05/2016   TRIG 105.0 01/02/2018   CHOLHDL 4 01/02/2018                Diabetic foot exam last done in 02/2018 by PCP  No history of hypothyroidism, last TSH in 7/19 by PCP normal  Complications from diabetes: Proliferative retinopathy, microalbuminuria  She refuses to take the flu vaccine  Physical Examination:  BP (!) 160/90 (BP Location: Left Arm, Patient Position: Sitting, Cuff Size: Large)   Pulse (!) 107   Ht 5\' 4"  (1.626 m)   Wt 247 lb (112 kg)   SpO2 96%   BMI 42.40 kg/m    1+ ankle edema present  ASSESSMENT:  Diabetes type 2, with obesity, insulin requiring  See history of present illness for detailed discussion of his current management, blood sugar patterns and problems identified  Her A1c previously was 10.3 and now 7.8  Her blood sugars however are still averaging over 200 in the mornings and mostly high She refuses to take basal insulin that could be helpful in getting her morning sugars down in addition to her premixed insulin that is inadequate May be benefiting somewhat from taking her metformin in the evening instead of morning for now Also has gained weight, not exercising or being consistent with diet  Recommended that she see the dietitian but she wants to try a low carbohydrate Internet-based diet  She has been concerned about unknown side effects from [place] and does not want to take this   Hypertension: Blood pressure is still consistently high and she continues to be resistant to improved  treatment She claims blood pressure is not as high at home but she does not keep a record and does not remember her numbers, not clear how accurate her monitor is Discussed the importance of blood pressure control because of her worsening renal function now and need for additional treatment She is already on a diuretic Also would not increase her ARB drug because of increasing creatinine  Edema: Fairly well controlled now    PLAN for diabetes:   Reassured her that she does not have any need to be afraid of side effects from [place] she needs this to help her overall control especially with fasting readings She does have the medication at home and discussed that there is no reason why she should not take it Again given a flowsheet to help her adjust the basal insulin We will start with 10 units of Tresiba at night and she may take this with her suppertime dose She will increase the dose by 2 units every 4 to 5 days until morning sugars are under 130 No change in premixed insulin  She agrees to see the dietitian and she not able to lose weight and improve her control on her own with a  low carbohydrate diet she wants to find on the Internet  Follow-up in 2 months  HYPERTENSION with mild microalbuminuria: She will start Catapres 0.1 mg weekly Continue valsartan Nephrology consultation  She will take her blood pressure medication in the morning before coming to the office and keep a record of her blood pressure at home and bring her monitor for comparison  There are no Patient Instructions on file for this visit.  Counseling time on subjects discussed in assessment and plan sections is over 50% of today's 25 minute visit   Elayne Snare 03/23/2018, 10:32 AM   Note: This office note was prepared with Dragon voice recognition system technology. Any transcriptional errors that result from this process are unintentional.

## 2018-03-23 NOTE — Patient Instructions (Signed)
Check blood sugars on waking up 7 days a week  Also check blood sugars about 2 hours after meals and do this after different meals by rotation  Recommended blood sugar levels on waking up are 90-130 and about 2 hours after meal is 130-160  Please bring your blood sugar monitor to each visit, thank you  Metformin 2x daily   TRESIBA  insulin: This insulin provides blood sugar control for up to 24 hours.   Start with 10 units at nite daily and increase by 2 units every 3 days until the waking up sugars are under 130. Then continue the same dose.   If blood sugar is under 100 for 2 days in a row, reduce the dose by 2 units.  Note that this insulin does not control the rise of blood sugar with meals

## 2018-03-26 ENCOUNTER — Other Ambulatory Visit: Payer: Self-pay | Admitting: Endocrinology

## 2018-03-27 ENCOUNTER — Other Ambulatory Visit: Payer: Self-pay

## 2018-03-27 MED ORDER — INSULIN PEN NEEDLE 31G X 6 MM MISC: 3 refills | Status: DC

## 2018-03-29 ENCOUNTER — Other Ambulatory Visit: Payer: Self-pay

## 2018-03-29 MED ORDER — GLUCOSE BLOOD VI STRP: ORAL_STRIP | 0 refills | Status: DC

## 2018-03-30 ENCOUNTER — Other Ambulatory Visit: Payer: Self-pay

## 2018-03-30 MED ORDER — ACCU-CHEK FASTCLIX LANCETS MISC: 3 refills | Status: DC

## 2018-03-30 MED ORDER — GLUCOSE BLOOD VI STRP: ORAL_STRIP | 3 refills | Status: DC

## 2018-03-30 MED ORDER — ACCU-CHEK GUIDE W/DEVICE KIT: 1.0000 | PACK | 0 refills | Status: DC

## 2018-04-02 ENCOUNTER — Telehealth: Payer: Self-pay | Admitting: Endocrinology

## 2018-04-02 ENCOUNTER — Other Ambulatory Visit: Payer: Self-pay | Admitting: Endocrinology

## 2018-04-02 ENCOUNTER — Other Ambulatory Visit: Payer: Self-pay

## 2018-04-02 MED ORDER — ACCU-CHEK GUIDE W/DEVICE KIT: 1.0000 | PACK | 0 refills | Status: DC

## 2018-04-02 MED ORDER — ACCU-CHEK FASTCLIX LANCETS MISC: 3 refills | Status: DC

## 2018-04-02 MED ORDER — INSULIN PEN NEEDLE 31G X 6 MM MISC: 3 refills | Status: DC

## 2018-04-02 MED ORDER — GLUCOSE BLOOD VI STRP: ORAL_STRIP | 3 refills | Status: DC

## 2018-04-02 NOTE — Telephone Encounter (Signed)
Patient called stating that she states her pharmacy gave her a bag of accu-check medications and that she states one touch. Olen Cordial confirmed he received fax from pharmacy and he will start working on it. Patient confirmed understanding.

## 2018-04-04 ENCOUNTER — Other Ambulatory Visit: Payer: Self-pay | Admitting: Obstetrics and Gynecology

## 2018-04-04 DIAGNOSIS — Z1231 Encounter for screening mammogram for malignant neoplasm of breast: Secondary | ICD-10-CM

## 2018-04-05 ENCOUNTER — Ambulatory Visit
Admission: RE | Admit: 2018-04-05 | Discharge: 2018-04-05 | Disposition: A | Discharge status: Home / Self Care | Payer: Medicare HMO | Source: Ambulatory Visit | Attending: Obstetrics and Gynecology | Admitting: Obstetrics and Gynecology

## 2018-04-05 ENCOUNTER — Other Ambulatory Visit: Payer: Self-pay

## 2018-04-05 DIAGNOSIS — Z1231 Encounter for screening mammogram for malignant neoplasm of breast: Secondary | ICD-10-CM

## 2018-04-26 ENCOUNTER — Other Ambulatory Visit: Payer: Self-pay | Admitting: Endocrinology

## 2018-05-15 ENCOUNTER — Other Ambulatory Visit: Payer: Medicare HMO

## 2018-05-21 ENCOUNTER — Other Ambulatory Visit (INDEPENDENT_AMBULATORY_CARE_PROVIDER_SITE_OTHER): Payer: Medicare HMO

## 2018-05-21 ENCOUNTER — Other Ambulatory Visit: Payer: Self-pay

## 2018-05-21 DIAGNOSIS — M1000 Idiopathic gout, unspecified site: Secondary | ICD-10-CM | POA: Diagnosis not present

## 2018-05-21 DIAGNOSIS — E1121 Type 2 diabetes mellitus with diabetic nephropathy: Secondary | ICD-10-CM | POA: Diagnosis not present

## 2018-05-21 DIAGNOSIS — E1165 Type 2 diabetes mellitus with hyperglycemia: Secondary | ICD-10-CM | POA: Diagnosis not present

## 2018-05-21 DIAGNOSIS — Z794 Long term (current) use of insulin: Secondary | ICD-10-CM | POA: Diagnosis not present

## 2018-05-21 LAB — BASIC METABOLIC PANEL
BUN: 38 mg/dL — ABNORMAL HIGH (ref 6–23)
CO2: 29 mEq/L (ref 19–32)
Calcium: 10 mg/dL (ref 8.4–10.5)
Chloride: 100 mEq/L (ref 96–112)
Creatinine, Ser: 1.56 mg/dL — ABNORMAL HIGH (ref 0.40–1.20)
GFR: 40.35 mL/min — ABNORMAL LOW (ref >60.00)
Glucose, Bld: 186 mg/dL — ABNORMAL HIGH (ref 70–99)
Potassium: 4.5 mEq/L (ref 3.5–5.1)
Sodium: 138 mEq/L (ref 135–145)

## 2018-05-22 ENCOUNTER — Ambulatory Visit: Payer: Medicare HMO | Admitting: Endocrinology

## 2018-05-22 LAB — FRUCTOSAMINE: Fructosamine: 288 umol/L — ABNORMAL HIGH (ref 0–285)

## 2018-05-23 ENCOUNTER — Other Ambulatory Visit: Payer: Self-pay | Admitting: Endocrinology

## 2018-05-23 ENCOUNTER — Ambulatory Visit (INDEPENDENT_AMBULATORY_CARE_PROVIDER_SITE_OTHER): Payer: Medicare HMO | Admitting: Endocrinology

## 2018-05-23 ENCOUNTER — Encounter: Payer: Self-pay | Admitting: Endocrinology

## 2018-05-23 ENCOUNTER — Other Ambulatory Visit: Payer: Self-pay

## 2018-05-23 VITALS — BP 140/80 | HR 105 | Wt 243.8 lb

## 2018-05-23 DIAGNOSIS — E1129 Type 2 diabetes mellitus with other diabetic kidney complication: Secondary | ICD-10-CM

## 2018-05-23 DIAGNOSIS — Z794 Long term (current) use of insulin: Secondary | ICD-10-CM | POA: Diagnosis not present

## 2018-05-23 DIAGNOSIS — E1165 Type 2 diabetes mellitus with hyperglycemia: Secondary | ICD-10-CM | POA: Diagnosis not present

## 2018-05-23 DIAGNOSIS — I10 Essential (primary) hypertension: Secondary | ICD-10-CM | POA: Diagnosis not present

## 2018-05-23 DIAGNOSIS — M1000 Idiopathic gout, unspecified site: Secondary | ICD-10-CM | POA: Diagnosis not present

## 2018-05-23 DIAGNOSIS — R809 Proteinuria, unspecified: Secondary | ICD-10-CM

## 2018-05-23 LAB — URIC ACID: Uric Acid, Serum: 11.5 mg/dL — ABNORMAL HIGH (ref 2.4–7.0)

## 2018-05-23 MED ORDER — ALLOPURINOL 100 MG PO TABS: 100.0000 mg | ORAL_TABLET | Freq: Every day | ORAL | 3 refills | Status: DC

## 2018-05-23 NOTE — Progress Notes (Signed)
Patient ID: Stephanie [name], female   DOB: Sep 03, 1955, 63 y.o.   MRN: [id number]           Reason for Appointment: Followup of diabetes and hypertension  Referring physician: Hulan Fess  History of Present Illness:          Diagnosis: Type 2 diabetes mellitus, date of diagnosis: 2003       Past history:  She was initially treated metformin at some point glimepiride also added and not clear what her level of control was in the first few years. Had been only taking 500 mg twice a day of metformin and Amaryl increased to 4 mg twice a day several years ago In the last few years her blood sugar control has been more difficult and she has been managed by an endocrinologist Because of poor control she had been tried on Janumet and [place] but she states that she could not tolerate them because of headaches Had poor control with Amaryl and metformin and also difficulty with weight gain she had started Trulicity in 78/2423 on her initial consultation Her weight on her initial visit was 240 pounds. Her A1c had gone down to 7.1% in 53/61 with using Trulicity which she was tolerating at that time However in 2/16 she stopped taking Trulicity as it was causing nausea , subsequently did not want to go on [place] Because of markedly increased sugars she has been on premixed insulin since 4/16  Recent history:   INSULIN DOSE: NovoLog mix 70/30, 18 before breakfast and 48 supper   Oral hypoglycemic drugs the patient is taking are: Metformin 1 g once a day   A1c was last  7.8 Fructosamine is 288, previously 314   Current diabetes history, management, problems and blood sugar patterns:   She was recommended [place] on her last 2 visits and even though she had agreed to do it she still has not started this again  She is concerned about taking 2 types of insulins  She again thinks that her sugars are better but review of her monitor downloads indicate fairly similar readings to the prior visit   Currently has significantly and persistently high fasting readings averaging over 200  She however does not check readings after dinner usually and not clear how high they are  She may have improved her overall calorie intake and weight down about 4 pounds  She was supposed to see the dietitian after her last visit but she did not do so  However still not doing any regular walking  She thinks she is usually consistent with her insulin regimen but cannot explain sugar on Monday afternoon was 307 at 4 PM  Blood sugars midday are relatively better      Side effects from medications have been: Januvia, Onglyza apparently caused headaches, Invokana: Candidiasis   Compliance with the medical regimen: Fair    Dinner; usually at 7 pm  Glucose monitoring:  done 1 times a day or more       Glucometer: One Touch Ultra.      Blood Glucose readings by download:   PRE-MEAL Fasting Lunch Dinner Bedtime Overall  Glucose range: 156-234  116-212   146-257  235   Mean/median:   153    193+/-35   Previous readings:  PRE-MEAL Fasting Lunch Dinner Bedtime Overall  Glucose range:  147-275   152-295    Mean/median:  209   199   199+/-50   POST-MEAL PC Breakfast PC Lunch PC Dinner  Glucose range:    163, 164  Mean/median:        Glycemic control:   Lab Results  Component Value Date   HGBA1C 7.8 (H) 03/20/2018   HGBA1C 10.3 (H) 10/31/2017   HGBA1C 6.9 (H) 06/22/2017   Lab Results  Component Value Date   MICROALBUR 59.1 (H) 10/31/2017   LDLCALC 110 (H) 01/02/2018   CREATININE 1.56 (H) 05/21/2018    Self-care: The diet that the patient has been following is: tries to limit fats usually.      Meals: 2-3 meals per day. Breakfast is cereal or scrambled eggs/sausage.   Lunch is half sandwich, yogurt and fruit; dinner usually baked chicken, bread and vegetables, snacks are fruits or nuts. Not eating out regularly  Exercise: Minimal        Dietician visit: Most recent: 06/2015 CDE visit:  12/15              Weight history: Previous range 200-260  Wt Readings from Last 3 Encounters:  05/23/18 243 lb 12.8 oz (110.6 kg)  03/23/18 247 lb (112 kg)  01/05/18 242 lb 3.2 oz (109.9 kg)    Other active problems: discussed in review of systems   LABS:  Lab on 05/21/2018  Component Date Value Ref Range Status  . Fructosamine 05/21/2018 288* 0 - 285 umol/L Final   Comment: Published reference interval for apparently healthy subjects between age 39 and 64 is 22 - 285 umol/L and in a poorly controlled diabetic population is 228 - 563 umol/L with a mean of 396 umol/L.   Marland Kitchen Sodium 05/21/2018 138  135 - 145 mEq/L Final  . Potassium 05/21/2018 4.5  3.5 - 5.1 mEq/L Final  . Chloride 05/21/2018 100  96 - 112 mEq/L Final  . CO2 05/21/2018 29  19 - 32 mEq/L Final  . Glucose, Bld 05/21/2018 186* 70 - 99 mg/dL Final  . BUN 05/21/2018 38* 6 - 23 mg/dL Final  . Creatinine, Ser 05/21/2018 1.56* 0.40 - 1.20 mg/dL Final  . Calcium 05/21/2018 10.0  8.4 - 10.5 mg/dL Final  . GFR 05/21/2018 40.35* >60.00 mL/min Final     Allergies as of 05/23/2018      Reactions   Avapro [irbesartan] Other (See Comments)   headaches   Codeine Nausea And Vomiting   Lisinopril Itching      Medication List       Accurate as of May 23, 2018 10:36 AM. Always use your most recent med list.        Accu-Chek FastClix Lancets Misc Use as instructed to check blood sugar three times daily. DX:E11.65   Accu-Chek Guide w/Device Kit 1 each by Does not apply route See admin instructions. Use meter to check blood sugar 3 times daily. DX:E11.65   atorvastatin 40 MG tablet Commonly known as:  LIPITOR Take 1 tablet (40 mg total) by mouth daily.   cloNIDine 0.1 mg/24hr patch Commonly known as:  CATAPRES - Dosed in mg/24 hr Place 1 patch (0.1 mg total) onto the skin once a week.   furosemide 40 MG tablet Commonly known as:  LASIX Take 1 tablet by mouth once daily   glucose blood test strip Commonly  known as:  ONE TOUCH ULTRA TEST USE 1 STRIP TO CHECK GLUCOSE THREE TIMES DAILY   hydrocortisone valerate cream 0.2 % Commonly known as:  WESTCORT   insulin aspart protamine - aspart (70-30) 100 UNIT/ML FlexPen Commonly known as:  NovoLOG Mix 70/30 FlexPen INJECT 18 UNITS UNDER THE  SKIN IN THE MORNING AND 20 UNITS IN THE EVENING.   insulin NPH-regular Human (70-30) 100 UNIT/ML injection INJECT 20 UNITS UNDER THE SKIN IN THE MORNING BEFORE BREAKFAST AND 48 UNITS BEFORE DINNER.   Insulin Pen Needle 31G X 6 MM Misc Commonly known as:  ReliOn Pen Needles USE TO INJECT INSULIN TWICE DAILY. DX:E11.65   metFORMIN 1000 MG tablet Commonly known as:  GLUCOPHAGE TAKE 1 TABLET BY MOUTH TWICE DAILY WITH A MEAL   potassium chloride 10 MEQ tablet Commonly known as:  K-DUR Take 1 tablet by mouth once daily   valsartan 160 MG tablet Commonly known as:  DIOVAN Take 1 tablet (160 mg total) by mouth daily.       Allergies:  Allergies  Allergen Reactions  . Avapro [Irbesartan] Other (See Comments)    headaches  . Codeine Nausea And Vomiting  . Lisinopril Itching    Past Medical History:  Diagnosis Date  . Abnormal Pap smear   . Asthma   . Diabetes mellitus   . Hyperlipidemia   . Hypertension   . LGSIL (low grade squamous intraepithelial dysplasia)     Past Surgical History:  Procedure Laterality Date  . ABDOMINAL HYSTERECTOMY    . bladder surgery    . COLPOSCOPY      Family History  Problem Relation Age of Onset  . Diabetes Mother   . Diabetes Maternal Grandmother   . Heart disease Neg Hx   . Hypertension Neg Hx     Social History:  reports that she has never smoked. She has never used smokeless tobacco. She reports that she does not drink alcohol or use drugs.    Review of Systems   EDEMA: She has a history of significant edema, taking 80 mg Lasix in divided doses  Left foot pain: She said that a couple of weeks ago she started having pain and tenderness in her  left distal foot and was having difficulty walking with it She did not report this and pain appears to be spontaneously improving No prior history of gout or known hyperuricemia   Hypertension:   She is taking valsartan 160 mg and also taking Catapres that was added on the last visit   Recent home blood pressure: 139/80  BP Readings from Last 3 Encounters:  05/23/18 140/80  03/23/18 (!) 160/90  01/05/18 140/90   Renal function has been variable slightly better now, has not seen the nephrologist and recommended to her PCP  She continues to take Lasix 80  for edema and also on potassium supplements  Lab Results  Component Value Date   CREATININE 1.56 (H) 05/21/2018   CREATININE 1.41 (H) 03/20/2018   CREATININE 1.17 01/02/2018        Lipids:    LDL has been previously high, around 180 baseline She has been regular with her Lipitor but her LDL is still over 100       Lab Results  Component Value Date   CHOL 179 01/02/2018   HDL 47.50 01/02/2018   LDLCALC 110 (H) 01/02/2018   LDLDIRECT 89.0 12/05/2016   TRIG 105.0 01/02/2018   CHOLHDL 4 01/02/2018                Diabetic foot exam last done in 02/2018 by PCP  No history of hypothyroidism, last TSH in 7/19 by PCP normal  Complications from diabetes: Proliferative retinopathy, microalbuminuria  She refuses to take the flu vaccine  Physical Examination:  BP 140/80 (BP Location: Left Arm,  Patient Position: Sitting, Cuff Size: Large)   Pulse (!) 105   Wt 243 lb 12.8 oz (110.6 kg)   SpO2 96%   BMI 41.85 kg/m    Diabetic Foot Exam - Simple   Simple Foot Form Diabetic Foot exam was performed with the following findings:  Yes   Visual Inspection No deformities, no ulcerations, no other skin breakdown bilaterally:  Yes Sensation Testing Intact to touch and monofilament testing bilaterally:  Yes Pulse Check Posterior Tibialis and Dorsalis pulse intact bilaterally:  Yes Comments     1+ ankle edema present   She has mild swelling of the left distal foot with warmth on the medial area compared to the right but no tenderness or erythema   ASSESSMENT:  Diabetes type 2, with obesity, insulin requiring  See history of present illness for detailed discussion of his current management, blood sugar patterns and problems identified  Her A1c previously was ` 7.8  Her fructosamine is relatively high although better than before  Her blood sugars however are still averaging over 200 in the mornings and more consistently higher than the rest of the day However she is not checking sugars after evening meal and not clear if she needs to adjust her evening insulin Discussed that her fasting readings are not better even though she is starting to lose weight She refuses to take basal insulin even though this has been prescribed several months ago and she has a prescription at home Also discussed benefits of basal insulin, duration of action and lack of peak effect Explained to her that she can get better controlled with improved fasting readings She does have some symptoms of neuropathy and discussed potential worsening of these symptoms and other complications  She does need to exercise also    Hypertension: Blood pressure is better controlled with adding Catapres and she will continue this  Recent left foot pain is very suggestive of gout, she still has mild warmth and swelling of the left medial distal foot Need to have uric acid evaluated  Edema: Fairly well controlled now with 80 mg Lasix However because of potential for hyperuricemia needs to reduce this to 60 mg    PLAN for diabetes:   Start Tresiba 8 units daily Since she cannot follow instructions per the flowsheet have given her instructions on her AVS about when to take the insulin as well as adjusting the dose only once a week until morning sugars are below 140 She does need to check readings after evening meal Balanced low-fat meals  Start regular walking when she can She will call if she has any hypoglycemia   Follow-up in 2 months  HYPERTENSION with mild microalbuminuria: She will start Catapres 0.1 mg weekly Continue valsartan  Continue to monitor microalbumin periodically  Patient Instructions  TAKE 8 UNITS TRESIBA AT DINNER DAILY  EVERY Sunday IF AM sugars still over 140 then go up 2 more units  Check blood sugars on waking up days a week  Also check blood sugars about 2 hours after meals and do this after different meals by rotation  Recommended blood sugar levels on waking up are 90-130 and about 2 hours after meal is 130-160  Please bring your blood sugar monitor to each visit, thank you     Total visit time for evaluation and management of multiple problems and counseling =25 minutes   Elayne Snare 05/23/2018, 10:36 AM   Note: This office note was prepared with Dragon voice recognition system technology. Any  transcriptional errors that result from this process are unintentional.  ADDENDUM: Uric acid is 11.5 She will reduce Lasix to 60 mg and start allopurinol 100 mg Follow-up uric acid on the next visit

## 2018-05-23 NOTE — Patient Instructions (Addendum)
TAKE 8 UNITS TRESIBA AT DINNER DAILY  EVERY Sunday IF AM sugars still over 140 then go up 2 more units  Check blood sugars on waking up days a week  Also check blood sugars about 2 hours after meals and do this after different meals by rotation  Recommended blood sugar levels on waking up are 90-130 and about 2 hours after meal is 130-160  Please bring your blood sugar monitor to each visit, thank you  TAKE LASIX 1 IN AM AND 1/2 IN PM

## 2018-05-24 ENCOUNTER — Other Ambulatory Visit: Payer: Self-pay

## 2018-05-24 ENCOUNTER — Telehealth: Payer: Self-pay

## 2018-05-24 ENCOUNTER — Telehealth: Payer: Self-pay | Admitting: Endocrinology

## 2018-05-24 MED ORDER — INSULIN ASPART PROT & ASPART (70-30 MIX) 100 UNIT/ML PEN: PEN_INJECTOR | SUBCUTANEOUS | 3 refills | Status: DC

## 2018-05-24 MED ORDER — INSULIN DEGLUDEC 100 UNIT/ML ~~LOC~~ SOPN: PEN_INJECTOR | SUBCUTANEOUS | 3 refills | Status: DC

## 2018-05-24 NOTE — Telephone Encounter (Signed)
Pt stated that she is taking 18 units of Novolog in the am and 48 units at dinner. Sig previously stated to take 18 and 20. Pt also stated that you instructed her to take Tresiba 8units daily with supper, so this rx was sent to the pharmacy.  Does pt need to take 18 and 20 or 18 and 48 on the Novolog?

## 2018-05-24 NOTE — Telephone Encounter (Signed)
Pharmacy requested updated prescription to reflect accurate insulin dose for novolog. Pt stated that she was running our early. According to office visit note from yesterday, there were no patient instructions to change dose. Is patient supposed to change the dose of Novolog?

## 2018-05-24 NOTE — Telephone Encounter (Signed)
She will take 18 in the morning and 48 of NovoLog mix as before and this is on my note.  She already has a prescription for Tresiba at home that she did not start.  Also make sure she is picking up her prescription for allopurinol

## 2018-05-24 NOTE — Telephone Encounter (Signed)
Per [facility], "Caller states is needing to speak with physician in regards to her medication for diabetes. They have her down with the wrong products. Pharmacy needs a corrected prescription. Caller reports issue with prescription, Glucose monitor wrong brand. Has glucometer that does work. Visit with MD today."  Placed in box.

## 2018-05-24 NOTE — Telephone Encounter (Signed)
There is no recent change in dosage, need to see why she is running short

## 2018-05-30 ENCOUNTER — Emergency Department (HOSPITAL_COMMUNITY)
Admission: EM | Admit: 2018-05-30 | Discharge: 2018-05-30 | Disposition: A | Discharge status: Home / Self Care | Payer: Medicare HMO | Attending: Emergency Medicine | Admitting: Emergency Medicine

## 2018-05-30 ENCOUNTER — Encounter (HOSPITAL_COMMUNITY): Payer: Self-pay

## 2018-05-30 ENCOUNTER — Other Ambulatory Visit: Payer: Self-pay

## 2018-05-30 DIAGNOSIS — I10 Essential (primary) hypertension: Secondary | ICD-10-CM | POA: Insufficient documentation

## 2018-05-30 DIAGNOSIS — Z794 Long term (current) use of insulin: Secondary | ICD-10-CM | POA: Diagnosis not present

## 2018-05-30 DIAGNOSIS — M79672 Pain in left foot: Secondary | ICD-10-CM | POA: Diagnosis not present

## 2018-05-30 DIAGNOSIS — E11319 Type 2 diabetes mellitus with unspecified diabetic retinopathy without macular edema: Secondary | ICD-10-CM | POA: Diagnosis not present

## 2018-05-30 DIAGNOSIS — M109 Gout, unspecified: Secondary | ICD-10-CM | POA: Diagnosis not present

## 2018-05-30 DIAGNOSIS — Z79899 Other long term (current) drug therapy: Secondary | ICD-10-CM | POA: Diagnosis not present

## 2018-05-30 DIAGNOSIS — J45909 Unspecified asthma, uncomplicated: Secondary | ICD-10-CM | POA: Diagnosis not present

## 2018-05-30 DIAGNOSIS — M79671 Pain in right foot: Secondary | ICD-10-CM | POA: Diagnosis not present

## 2018-05-30 MED ORDER — OXYCODONE-ACETAMINOPHEN 5-325 MG PO TABS: 1.0000 | ORAL_TABLET | ORAL | 0 refills | Status: DC | PRN

## 2018-05-30 MED ORDER — ONDANSETRON 4 MG PO TBDP
4.0000 mg | ORAL_TABLET | Freq: Once | ORAL | Status: AC
  Administered 2018-05-30: 4 mg via ORAL
  Filled 2018-05-30: qty 1

## 2018-05-30 MED ORDER — ONDANSETRON 4 MG PO TBDP: 4.0000 mg | ORAL_TABLET | Freq: Three times a day (TID) | ORAL | 0 refills | Status: DC | PRN

## 2018-05-30 MED ORDER — OXYCODONE-ACETAMINOPHEN 5-325 MG PO TABS
1.0000 | ORAL_TABLET | Freq: Once | ORAL | Status: AC
  Administered 2018-05-30: 1 via ORAL
  Filled 2018-05-30: qty 1

## 2018-05-30 NOTE — Discharge Instructions (Signed)
Take the prescribed medication as directed.  Take pain medication with food, zofran can help if it still makes you nauseated. Follow-up with you primary care doctor. Return to the ED for new or worsening symptoms.

## 2018-05-30 NOTE — ED Provider Notes (Signed)
[facility] EMERGENCY DEPARTMENT Provider Note   CSN: [id number] Arrival date & time: 05/30/18  0357    History   Chief Complaint Chief Complaint  Patient presents with  . Foot Pain    HPI Stephanie [name] is a 63 y.o. female.     The history is provided by the patient and medical records.  Foot Pain      63 y.o. F with hx of asthma, DM, HLP, HTN, presenting to the ED for bilateral foot pain.  States this has been ongoing since 05/05/18.  She recently saw her endocrinologist, Dr. Dwyane Dee, and felt like this was related to gout.  States she was told she had elevated uric acid levels, was started on allopurinol and lasix dosage was decreased.  States she has not noticed any improvement in her symptoms and now pain is worsening.  States so uncomfortable tonight she could not go to sleep.  Majority of her pain is in her big toes.  She has not had any noted fever or chills.  She can still walk but is extremely painful to do so.  Past Medical History:  Diagnosis Date  . Abnormal Pap smear   . Asthma   . Diabetes mellitus   . Hyperlipidemia   . Hypertension   . LGSIL (low grade squamous intraepithelial dysplasia)     Patient Active Problem List   Diagnosis Date Noted  . Uncontrolled type 2 diabetes mellitus with hyperglycemia, with long-term current use of insulin ([place]) 12/07/2016  . Diabetes type 2, uncontrolled ([place]) 10/28/2013  . DM retinopathy ([place]) 09/13/2013  . Type II diabetes mellitus with renal manifestations ([place]) 09/13/2013  . Essential hypertension, benign 09/13/2013  . Hyperlipidemia associated with type 2 diabetes mellitus ([place]) 09/13/2013  . LGSIL (low grade squamous intraepithelial dysplasia)     Past Surgical History:  Procedure Laterality Date  . ABDOMINAL HYSTERECTOMY    . bladder surgery    . COLPOSCOPY       OB History    Gravida  2   Para  2   Term      Preterm      AB      Living  2     SAB      TAB      Ectopic    Multiple      Live Births               Home Medications    Prior to Admission medications   Medication Sig Start Date End Date Taking? Authorizing Provider  allopurinol (ZYLOPRIM) 100 MG tablet Take 1 tablet (100 mg total) by mouth daily. For high uric acid 05/23/18   Elayne Snare, MD  atorvastatin (LIPITOR) 40 MG tablet Take 1 tablet (40 mg total) by mouth daily. 02/20/18   Elayne Snare, MD  Blood Glucose Monitoring Suppl (ONE TOUCH ULTRA 2) w/Device KIT 1 each by Does not apply route See admin instructions. Use as instructed to check 2-3 times daily.    [provider]  cloNIDine (CATAPRES - DOSED IN MG/24 HR) 0.1 mg/24hr patch Place 1 patch (0.1 mg total) onto the skin once a week. 03/23/18   Elayne Snare, MD  furosemide (LASIX) 40 MG tablet Take 1 tablet by mouth once daily 04/26/18   Elayne Snare, MD  glucose blood (ONE TOUCH ULTRA TEST) test strip USE 1 STRIP TO CHECK GLUCOSE THREE TIMES DAILY 04/26/18   Elayne Snare, MD  hydrocortisone valerate cream (WESTCORT) 0.2 %  06/28/13   [provider]  insulin aspart protamine - aspart (NOVOLOG MIX 70/30 FLEXPEN) (70-30) 100 UNIT/ML FlexPen INJECT 18 UNITS UNDER THE SKIN IN THE MORNING AND 48 UNITS IN THE EVENING. 05/24/18   Elayne Snare, MD  insulin degludec (TRESIBA FLEXTOUCH) 100 UNIT/ML SOPN FlexTouch Pen Inject 8 units under the skin once daily with dinner. 05/24/18   Elayne Snare, MD  Insulin Pen Needle (RELION PEN NEEDLES) 31G X 6 MM MISC USE TO INJECT INSULIN TWICE DAILY. DX:E11.65 04/02/18   Elayne Snare, MD  metFORMIN (GLUCOPHAGE) 1000 MG tablet TAKE 1 TABLET BY MOUTH TWICE DAILY WITH A MEAL 05/24/18   Elayne Snare, MD  potassium chloride (K-DUR) 10 MEQ tablet Take 1 tablet by mouth once daily 05/24/18   Elayne Snare, MD  valsartan (DIOVAN) 160 MG tablet Take 1 tablet (160 mg total) by mouth daily. 02/20/18   Elayne Snare, MD    Family History Family History  Problem Relation Age of Onset  . Diabetes Mother   . Diabetes Maternal  Grandmother   . Heart disease Neg Hx   . Hypertension Neg Hx     Social History Social History   Tobacco Use  . Smoking status: Never Smoker  . Smokeless tobacco: Never Used  Substance Use Topics  . Alcohol use: No  . Drug use: No     Allergies   Avapro [irbesartan]; Codeine; and Lisinopril   Review of Systems Review of Systems  Musculoskeletal: Positive for arthralgias.  All other systems reviewed and are negative.    Physical Exam Updated Vital Signs BP (!) 199/87 (BP Location: Right Arm)   Pulse (!) 107   Temp 98.8 F (37.1 C) (Oral)   Resp 16   Ht _0  (1.6 m)   Wt 108 kg   SpO2 96%   BMI 42.16 kg/m   Physical Exam Vitals signs and nursing note reviewed.  Constitutional:      Appearance: She is well-developed.  HENT:     Head: Normocephalic and atraumatic.  Eyes:     Conjunctiva/sclera: Conjunctivae normal.     Pupils: Pupils are equal, round, and reactive to light.  Neck:     Musculoskeletal: Normal range of motion.  Cardiovascular:     Rate and Rhythm: Normal rate and regular rhythm.     Heart sounds: Normal heart sounds.  Pulmonary:     Effort: Pulmonary effort is normal.     Breath sounds: Normal breath sounds.  Abdominal:     General: Bowel sounds are normal.     Palpations: Abdomen is soft.  Musculoskeletal: Normal range of motion.     Comments: Pedal edema, tenderness of MCP joints of bilateral great toes; slight erythema of dorsal feet but no warmth to touch compared with lower leg; DP pulse intact; moving toes normally; normal distal sensation/perfusion; no evidence of skin breakdown, open sores, or other wounds of the feet including between toes  Skin:    General: Skin is warm and dry.  Neurological:     Mental Status: She is alert and oriented to person, place, and time.      ED Treatments / Results  Labs (all labs ordered are listed, but only abnormal results are displayed) Labs Reviewed - No data to display  EKG None   Radiology No results found.  Procedures Procedures (including critical care time)  Medications Ordered in ED Medications  oxyCODONE-acetaminophen (PERCOCET/ROXICET) 5-325 MG per tablet 1 tablet (1 tablet Oral Given 05/30/18 0441)  ondansetron (ZOFRAN-ODT)  disintegrating tablet 4 mg (4 mg Oral Given 05/30/18 0441)     Initial Impression / Assessment and Plan / ED Course  I have reviewed the triage vital signs and the nursing notes.  Pertinent labs & imaging results that were available during my care of the patient were reviewed by me and considered in my medical decision making (see chart for details).  63 year old female here with bilateral foot pain.  Seen by her endocrinologist recently and found to have elevated uric acid and started on allopurinol.  This was felt to be related to her Lasix so dosage was decreased.  She reports pain has worsened, making her unable to sleep tonight.  States even the blanket lying on the bed is hurting her feet.  She is afebrile and nontoxic.  Majority of her pain seems centered around the MCP joints of her great toes.  She has some slight erythema of the skin but no significant warmth to touch compared with the lower leg.  Her foot is neurovascularly intact.  She is diabetic but I do not appreciate any ulcers, wounds, or skin breakdown of the feet including between the toes.  Her feet are both neurovascularly intact.  No signs/symptoms suggestive of septic joint.  I feel that this is likely related to gout.  She has not had any falls or trauma to suggest acute fracture so I did not feel emergent imaging indicated at this time.  She will be prescribed short course pain medication.  Encouraged diet modification including limiting red meat, fish, etc.  She will follow-up closely with her primary care doctor.  She will return here for any new or acute changes.  Final Clinical Impressions(s) / ED Diagnoses   Final diagnoses:  Gout of both feet    ED Discharge  Orders         Ordered    oxyCODONE-acetaminophen (PERCOCET) 5-325 MG tablet  Every 4 hours PRN     05/30/18 0512    ondansetron (ZOFRAN ODT) 4 MG disintegrating tablet  Every 8 hours PRN     05/30/18 0512           Larene Pickett, PA-C 32/20/19 9241    Delora Fuel, MD 55/16/14 [phone]

## 2018-05-30 NOTE — ED Triage Notes (Signed)
Pt complains of bilateral foot pain that started on 4/11. Pt states she was placed on zyloprim 4 days ago for elevated uric acid, she denies improvement of pain. Pt reports increased swelling to bilateral feet but states she has also decreased her dose of lasix.

## 2018-05-31 ENCOUNTER — Telehealth (HOSPITAL_COMMUNITY): Payer: Self-pay | Admitting: Emergency Medicine

## 2018-05-31 ENCOUNTER — Telehealth: Payer: Self-pay | Admitting: *Deleted

## 2018-05-31 MED ORDER — OXYCODONE-ACETAMINOPHEN 5-325 MG PO TABS: 1.0000 | ORAL_TABLET | ORAL | 0 refills | Status: DC | PRN

## 2018-05-31 NOTE — Telephone Encounter (Signed)
Received call from [name] who is requesting electronic prescription of Percocet as they cannot accept paper copy written during ED visit yesterday. E Rx sent.

## 2018-05-31 NOTE — Telephone Encounter (Signed)
Received call from [place] and pt brought in paper Rx for oxycodone. Pharmacy will need a escribed Rx to pharmacy. Message sent to Dr. Ronnald Nian to assist. Jonnie Finner RN CCM Case Mgmt phone [phone]

## 2018-05-31 NOTE — Telephone Encounter (Signed)
Sent narcotic rx via epic as patient could not fill paper script given today by previous ED provider.

## 2018-06-01 ENCOUNTER — Telehealth: Payer: Self-pay | Admitting: *Deleted

## 2018-06-01 NOTE — Telephone Encounter (Signed)
Rx are ready for pick up at [facility]. Contacted pt and left message for return call. Jonnie Finner RN CCM Case Mgmt phone [phone]

## 2018-06-20 DIAGNOSIS — M79671 Pain in right foot: Secondary | ICD-10-CM | POA: Diagnosis not present

## 2018-06-20 DIAGNOSIS — Z6841 Body Mass Index (BMI) 40.0 and over, adult: Secondary | ICD-10-CM | POA: Diagnosis not present

## 2018-06-20 DIAGNOSIS — M79673 Pain in unspecified foot: Secondary | ICD-10-CM | POA: Diagnosis not present

## 2018-06-23 ENCOUNTER — Other Ambulatory Visit: Payer: Self-pay | Admitting: Endocrinology

## 2018-06-25 DIAGNOSIS — M19071 Primary osteoarthritis, right ankle and foot: Secondary | ICD-10-CM | POA: Diagnosis not present

## 2018-06-25 DIAGNOSIS — M19072 Primary osteoarthritis, left ankle and foot: Secondary | ICD-10-CM | POA: Diagnosis not present

## 2018-07-24 ENCOUNTER — Telehealth: Payer: Self-pay | Admitting: Endocrinology

## 2018-07-24 NOTE — Telephone Encounter (Signed)
Wanted to advise that she is rescheduling because she is unable to walk to bilateral foot tendonitis

## 2018-07-25 ENCOUNTER — Other Ambulatory Visit: Payer: Self-pay

## 2018-07-25 MED ORDER — NOVOLOG MIX 70/30 FLEXPEN (70-30) 100 UNIT/ML ~~LOC~~ SUPN: PEN_INJECTOR | SUBCUTANEOUS | 3 refills | Status: DC

## 2018-07-25 MED ORDER — TRESIBA FLEXTOUCH 100 UNIT/ML ~~LOC~~ SOPN: PEN_INJECTOR | SUBCUTANEOUS | 3 refills | Status: DC

## 2018-07-25 MED ORDER — GLUCOSE BLOOD VI STRP: ORAL_STRIP | 0 refills | Status: DC

## 2018-07-25 MED ORDER — RELION PEN NEEDLES 31G X 6 MM MISC: 3 refills | Status: DC

## 2018-07-25 MED ORDER — METFORMIN HCL 1000 MG PO TABS: ORAL_TABLET | ORAL | 0 refills | Status: DC

## 2018-07-26 ENCOUNTER — Other Ambulatory Visit: Payer: Medicare HMO

## 2018-07-26 ENCOUNTER — Ambulatory Visit: Payer: Medicare HMO | Admitting: Endocrinology

## 2018-07-30 ENCOUNTER — Ambulatory Visit: Payer: Medicare HMO | Admitting: Endocrinology

## 2018-07-31 ENCOUNTER — Other Ambulatory Visit: Payer: Self-pay

## 2018-07-31 MED ORDER — GLUCOSE BLOOD VI STRP: ORAL_STRIP | 3 refills | Status: DC

## 2018-08-21 ENCOUNTER — Other Ambulatory Visit: Payer: Self-pay

## 2018-08-22 ENCOUNTER — Other Ambulatory Visit (INDEPENDENT_AMBULATORY_CARE_PROVIDER_SITE_OTHER): Payer: Medicare HMO

## 2018-08-22 DIAGNOSIS — E1165 Type 2 diabetes mellitus with hyperglycemia: Secondary | ICD-10-CM | POA: Diagnosis not present

## 2018-08-22 DIAGNOSIS — Z794 Long term (current) use of insulin: Secondary | ICD-10-CM

## 2018-08-22 DIAGNOSIS — M1000 Idiopathic gout, unspecified site: Secondary | ICD-10-CM

## 2018-08-22 LAB — BASIC METABOLIC PANEL
BUN: 31 mg/dL — ABNORMAL HIGH (ref 6–23)
CO2: 30 mEq/L (ref 19–32)
Calcium: 10.2 mg/dL (ref 8.4–10.5)
Chloride: 101 mEq/L (ref 96–112)
Creatinine, Ser: 1.41 mg/dL — ABNORMAL HIGH (ref 0.40–1.20)
GFR: 45.31 mL/min — ABNORMAL LOW (ref >60.00)
Glucose, Bld: 154 mg/dL — ABNORMAL HIGH (ref 70–99)
Potassium: 4.3 mEq/L (ref 3.5–5.1)
Sodium: 140 mEq/L (ref 135–145)

## 2018-08-22 LAB — URIC ACID: Uric Acid, Serum: 9.3 mg/dL — ABNORMAL HIGH (ref 2.4–7.0)

## 2018-08-22 LAB — HEMOGLOBIN A1C: Hgb A1c MFr Bld: 7.5 % — ABNORMAL HIGH (ref 4.6–6.5)

## 2018-08-24 ENCOUNTER — Encounter: Payer: Self-pay | Admitting: Endocrinology

## 2018-08-24 ENCOUNTER — Ambulatory Visit (INDEPENDENT_AMBULATORY_CARE_PROVIDER_SITE_OTHER): Payer: Medicare HMO | Admitting: Endocrinology

## 2018-08-24 ENCOUNTER — Other Ambulatory Visit: Payer: Self-pay

## 2018-08-24 VITALS — BP 140/70 | HR 94 | Ht 63.0 in | Wt 250.4 lb

## 2018-08-24 DIAGNOSIS — E1129 Type 2 diabetes mellitus with other diabetic kidney complication: Secondary | ICD-10-CM

## 2018-08-24 DIAGNOSIS — E782 Mixed hyperlipidemia: Secondary | ICD-10-CM

## 2018-08-24 DIAGNOSIS — R809 Proteinuria, unspecified: Secondary | ICD-10-CM | POA: Diagnosis not present

## 2018-08-24 DIAGNOSIS — E1165 Type 2 diabetes mellitus with hyperglycemia: Secondary | ICD-10-CM | POA: Diagnosis not present

## 2018-08-24 DIAGNOSIS — E790 Hyperuricemia without signs of inflammatory arthritis and tophaceous disease: Secondary | ICD-10-CM

## 2018-08-24 DIAGNOSIS — Z794 Long term (current) use of insulin: Secondary | ICD-10-CM | POA: Diagnosis not present

## 2018-08-24 DIAGNOSIS — I10 Essential (primary) hypertension: Secondary | ICD-10-CM | POA: Diagnosis not present

## 2018-08-24 MED ORDER — ALLOPURINOL 100 MG PO TABS: 100.0000 mg | ORAL_TABLET | Freq: Every day | ORAL | 3 refills | Status: DC

## 2018-08-24 NOTE — Patient Instructions (Addendum)
Restart allopurinol  Leave off Metformin for 1 week to see if itching improves  Check blood sugars on waking up 3-4 days a week  Also check blood sugars about 2 hours after meals and do this after different meals by rotation  Recommended blood sugar levels on waking up are 90-130 and about 2 hours after meal is 130-160  Please bring your blood sugar monitor to each visit, thank you  Low-fat and nondairy fat products, such as yogurt and skim milk. Fresh fruits and vegetables. Nuts, peanut butter, and grains. Fat and oil. Potatoes, rice, bread, and pasta. Eggs (in moderation) Meats like fish, chicken, and red meat are fine in moderation (around 4 to 6 ounces per day).

## 2018-08-24 NOTE — Progress Notes (Signed)
Patient ID: Stephanie [name], female   DOB: September 30, 1955, 63 y.o.   MRN: [id number]           Reason for Appointment: Followup of diabetes and hypertension  Referring physician: Hulan Fess  History of Present Illness:          Diagnosis: Type 2 diabetes mellitus, date of diagnosis: 2003       Past history:  She was initially treated metformin at some point glimepiride also added and not clear what her level of control was in the first few years. Had been only taking 500 mg twice a day of metformin and Amaryl increased to 4 mg twice a day several years ago In the last few years her blood sugar control has been more difficult and she has been managed by an endocrinologist Because of poor control she had been tried on Janumet and [place] but she states that she could not tolerate them because of headaches Had poor control with Amaryl and metformin and also difficulty with weight gain she had started Trulicity in 64/1583 on her initial consultation Her weight on her initial visit was 240 pounds. Her A1c had gone down to 7.1% in 09/40 with using Trulicity which she was tolerating at that time However in 2/16 she stopped taking Trulicity as it was causing nausea , subsequently did not want to go on [place] Because of markedly increased sugars she has been on premixed insulin since 4/16  Recent history:   INSULIN DOSE: NovoLog mix 70/30, 18 before breakfast and 48 supper  Tresiba 20 units daily  Oral hypoglycemic drugs the patient is taking are: Metformin 1 g once a day   A1c was last  7.8 and is now 7.5  Last fructosamine 288   Current diabetes history, management, problems and blood sugar patterns:   She was recommended [place] again on her last visit and she finally has started using this more recently  However even though she was told to only start with 8 units she says she is using 20 units  More likely she may have just started using that a week or so ago since her fasting  readings have come down significantly only recently previously this month they were all over 200  Also blood sugars appear to be mostly high in the evenings and afternoons but quite variable  Not clear which of the readings she has are before or after eating, likely has only 2 readings after dinner  Lowest readings are in the afternoon but not consistently  However she appears to have gained a significant amount of weight  She has not been able to do much physical activity because of her feet hurting which are better only recently  She is not always restricting fat foods such as hot dogs  As before she does not take the full dose of metformin despite reminders      Side effects from medications have been: Januvia, Onglyza apparently caused headaches, Invokana: Candidiasis   Compliance with the medical regimen: Fair    Dinner is usually at 7 pm  Glucose monitoring:  done 1 times a day or more       Glucometer: One Touch Ultra.      Blood Glucose readings by download with median readings:   PRE-MEAL  mornings Lunch Dinner Bedtime Overall  Glucose range:  141-292  92-211  139-329   92-329  Mean/median:  232   170   211   POST-MEAL PC Breakfast PC Lunch PC  Dinner  Glucose range:    220-289  Mean/median:    241   Previous readings:  PRE-MEAL Fasting Lunch Dinner Bedtime Overall  Glucose range: 156-234  116-212   146-257  235   Mean/median:   153    193+/-35     Glycemic control:   Lab Results  Component Value Date   HGBA1C 7.5 (H) 08/22/2018   HGBA1C 7.8 (H) 03/20/2018   HGBA1C 10.3 (H) 10/31/2017   Lab Results  Component Value Date   MICROALBUR 59.1 (H) 10/31/2017   LDLCALC 110 (H) 01/02/2018   CREATININE 1.41 (H) 08/22/2018    Self-care: The diet that the patient has been following is: tries to limit fats usually.      Meals: 2-3 meals per day. Breakfast is cereal or scrambled eggs/sausage.   Lunch is half sandwich, yogurt and fruit; dinner usually baked  chicken, bread and vegetables, snacks are fruits or nuts. Not eating out regularly  Exercise: Minimal        Dietician visit: Most recent: 06/2015 CDE visit: 12/15              Weight history: Previous range 200-260  Wt Readings from Last 3 Encounters:  08/24/18 250 lb 6.4 oz (113.6 kg)  05/30/18 238 lb (108 kg)  05/23/18 243 lb 12.8 oz (110.6 kg)    Other active problems: discussed in review of systems   LABS:  Lab on 08/22/2018  Component Date Value Ref Range Status  . Uric Acid, Serum 08/22/2018 9.3* 2.4 - 7.0 mg/dL Final  . Sodium 08/22/2018 140  135 - 145 mEq/L Final  . Potassium 08/22/2018 4.3  3.5 - 5.1 mEq/L Final  . Chloride 08/22/2018 101  96 - 112 mEq/L Final  . CO2 08/22/2018 30  19 - 32 mEq/L Final  . Glucose, Bld 08/22/2018 154* 70 - 99 mg/dL Final  . BUN 08/22/2018 31* 6 - 23 mg/dL Final  . Creatinine, Ser 08/22/2018 1.41* 0.40 - 1.20 mg/dL Final  . Calcium 08/22/2018 10.2  8.4 - 10.5 mg/dL Final  . GFR 08/22/2018 45.31* >60.00 mL/min Final  . Hgb A1c MFr Bld 08/22/2018 7.5* 4.6 - 6.5 % Final   Glycemic Control Guidelines for People with Diabetes:Non Diabetic:  <6%Goal of Therapy: <7%Additional Action Suggested:  >8%      Allergies as of 08/24/2018      Reactions   Avapro [irbesartan] Other (See Comments)   headaches   Codeine Nausea And Vomiting   Lisinopril Itching      Medication List       Accurate as of August 24, 2018 11:08 AM. If you have any questions, ask your nurse or doctor.        STOP taking these medications   cloNIDine 0.1 mg/24hr patch Commonly known as: CATAPRES - Dosed in mg/24 hr Stopped by: Elayne Snare, MD   oxyCODONE-acetaminophen 5-325 MG tablet Commonly known as: Percocet Stopped by: Elayne Snare, MD     TAKE these medications   allopurinol 100 MG tablet Commonly known as: ZYLOPRIM Take 1 tablet (100 mg total) by mouth daily. For high uric acid   atorvastatin 40 MG tablet Commonly known as: LIPITOR Take 1 tablet (40  mg total) by mouth daily.   furosemide 40 MG tablet Commonly known as: LASIX Take 1 tablet by mouth once daily   glucose blood test strip Commonly known as: ONE TOUCH ULTRA TEST USE 1 STRIP TO CHECK GLUCOSE THREE TIMES DAILY. DX:E11.65   hydrocortisone valerate cream  0.2 % Commonly known as: WESTCORT   metFORMIN 1000 MG tablet Commonly known as: GLUCOPHAGE Take 1,000 mg by mouth daily. Take 1 tablet by mouth once daily. What changed: Another medication with the same name was removed. Continue taking this medication, and follow the directions you see here. Changed by: Elayne Snare, MD   NovoLOG Mix 70/30 FlexPen (70-30) 100 UNIT/ML FlexPen Generic drug: insulin aspart protamine - aspart INJECT 18 UNITS UNDER THE SKIN IN THE MORNING AND 48 UNITS IN THE EVENING.   ONE TOUCH ULTRA 2 w/Device Kit 1 each by Does not apply route See admin instructions. Use as instructed to check 2-3 times daily.   potassium chloride 10 MEQ tablet Commonly known as: K-DUR TAKE 1  BY MOUTH ONCE DAILY   ReliOn Pen Needles 31G X 6 MM Misc Generic drug: Insulin Pen Needle USE TO INJECT INSULIN TWICE DAILY. DX:E11.65   Tresiba 100 UNIT/ML Soln Generic drug: Insulin Degludec Inject 20 Units into the skin daily. Inject 20 units under the skin once daily. What changed: Another medication with the same name was removed. Continue taking this medication, and follow the directions you see here. Changed by: Elayne Snare, MD   valsartan 160 MG tablet Commonly known as: DIOVAN Take 1 tablet (160 mg total) by mouth daily.       Allergies:  Allergies  Allergen Reactions  . Avapro [Irbesartan] Other (See Comments)    headaches  . Codeine Nausea And Vomiting  . Lisinopril Itching    Past Medical History:  Diagnosis Date  . Abnormal Pap smear   . Asthma   . Diabetes mellitus   . Hyperlipidemia   . Hypertension   . LGSIL (low grade squamous intraepithelial dysplasia)     Past Surgical History:   Procedure Laterality Date  . ABDOMINAL HYSTERECTOMY    . bladder surgery    . COLPOSCOPY      Family History  Problem Relation Age of Onset  . Diabetes Mother   . Diabetes Maternal Grandmother   . Heart disease Neg Hx   . Hypertension Neg Hx     Social History:  reports that she has never smoked. She has never used smokeless tobacco. She reports that she does not drink alcohol or use drugs.    Review of Systems   EDEMA: She has a history of significant edema, taking 40 mg Lasix Previously on 80 mg and not clear why she has reduced her dose, was asked to take 60 mg because of high uric acid  Left foot pain: She does not know what she was having problems with as he also had pain in the right foot after her last visit She says her PCP has sent her to a sports medicine doctor and told her she had tendinitis Not clear what treatment she has taken  HYPERURICEMIA:  This has been significantly high and she was given allopurinol since she was likely having gout on her last visit However she took it only for 30 days Uric acid is again significantly high She is asking about a low uric acid diet Lasix dose has been reduced somewhat  Lab Results  Component Value Date   LABURIC 9.3 (H) 08/22/2018      Hypertension:   She is taking valsartan 160 mg and also taking Catapres that was added on the last visit   Recent home blood pressure: 134/75  BP Readings from Last 3 Encounters:  08/24/18 140/70  05/30/18 (!) 199/87  05/23/18 140/80  Renal function has been variable slightly better now, has not seen a nephrologist  She continues to take Lasix 40 for edema and also on potassium supplements  Lab Results  Component Value Date   CREATININE 1.41 (H) 08/22/2018   CREATININE 1.56 (H) 05/21/2018   CREATININE 1.41 (H) 03/20/2018        Lipids:    LDL has been previously high, around 180 baseline She has been regular with her Lipitor but her LDL is still over 100        Lab Results  Component Value Date   CHOL 179 01/02/2018   HDL 47.50 01/02/2018   LDLCALC 110 (H) 01/02/2018   LDLDIRECT 89.0 12/05/2016   TRIG 105.0 01/02/2018   CHOLHDL 4 01/02/2018                Diabetic foot exam last done in 02/2018 by PCP  No history of hypothyroidism, last TSH in 7/19 by PCP normal  Complications from diabetes: Proliferative retinopathy, microalbuminuria  She refuses to take the flu vaccine  She is asking about periodic generalized itching and she thinks it was better when she stopped on her medication for a day or 2  Physical Examination:  BP 140/70 (BP Location: Left Arm, Patient Position: Sitting, Cuff Size: Large)   Pulse 94   Ht 5' 3" (1.6 m)   Wt 250 lb 6.4 oz (113.6 kg)   SpO2 98%   BMI 44.36 kg/m    2+ pedal edema present, 1+ ankle edema    ASSESSMENT:  Diabetes type 2, with obesity, insulin requiring  See history of present illness for detailed discussion of his current management, blood sugar patterns and problems identified  Her A1c is 7.5  Her blood sugars however are higher than expected for A1c and variable Although she thinks she has been taking [place] for some time her fasting readings are better only since about a week ago, previously over 200 She is however gaining weight both from poor diet, improved blood sugar control and lack of exercise She has previously not been interested in going to the dietitian Discussed need for weight loss  Hypertension: Blood pressure is controlled Benefited from Catapres and she will continue this  Gout and hyperuricemia: She has less symptoms now but she is not taking her allopurinol and uric acid is high Discussed long-term adverse effects of hyperuricemia including detrimental effects on kidney function  Edema: Fairly well controlled now with only 40 mg Lasix     PLAN for diabetes:   Currently will continue the same insulin Discussed needing to check blood sugars more  consistently after meals including after supper She needs to start brisk walking Cut back on high fat foods Given a list of preferred foods for hyperuricemia and avoid larger portions of red meat and hot dogs  Generalized itching: She can try to leave off metformin for a week to see if it makes a difference Otherwise needs to follow-up with her PCP  Hyperuricemia: She will go back on allopurinol at least 100 mg, will recheck on the next visit  HYPERLIPIDEMIA: Needs follow-up labs   HYPERTENSION with mild microalbuminuria: No change in medication Need follow-up urine albumin periodically  Patient Instructions  Restart allopurinol  Leave off Metformin for 1 week to see if itching improves  Check blood sugars on waking up 3-4 days a week  Also check blood sugars about 2 hours after meals and do this after different meals by rotation  Recommended blood sugar  levels on waking up are 90-130 and about 2 hours after meal is 130-160  Please bring your blood sugar monitor to each visit, thank you  Low-fat and nondairy fat products, such as yogurt and skim milk. Fresh fruits and vegetables. Nuts, peanut butter, and grains. Fat and oil. Potatoes, rice, bread, and pasta. Eggs (in moderation) Meats like fish, chicken, and red meat are fine in moderation (around 4 to 6 ounces per day).    Follow-up in 3 months  Counseling time on subjects discussed in assessment and plan sections is over 50% of today's 25 minute visit    Elayne Snare 08/24/2018, 11:08 AM   Note: This office note was prepared with Dragon voice recognition system technology. Any transcriptional errors that result from this process are unintentional.

## 2018-08-25 ENCOUNTER — Other Ambulatory Visit: Payer: Self-pay | Admitting: Endocrinology

## 2018-09-20 ENCOUNTER — Telehealth: Payer: Self-pay | Admitting: Endocrinology

## 2018-09-20 ENCOUNTER — Other Ambulatory Visit: Payer: Self-pay | Admitting: Endocrinology

## 2018-09-20 DIAGNOSIS — E1129 Type 2 diabetes mellitus with other diabetic kidney complication: Secondary | ICD-10-CM

## 2018-09-20 DIAGNOSIS — N289 Disorder of kidney and ureter, unspecified: Secondary | ICD-10-CM

## 2018-09-20 DIAGNOSIS — R600 Localized edema: Secondary | ICD-10-CM

## 2018-09-20 NOTE — Telephone Encounter (Signed)
Pt needs a referral to [facility]. Please and Thank you!

## 2018-09-20 NOTE — Telephone Encounter (Signed)
Pt stated that in march you requested that she see someone at [facility].

## 2018-09-20 NOTE — Telephone Encounter (Signed)
Referral sent 

## 2018-09-20 NOTE — Telephone Encounter (Signed)
Please find out why she wants a referral, usually this is done by PCP

## 2018-09-27 ENCOUNTER — Other Ambulatory Visit: Payer: Self-pay | Admitting: Endocrinology

## 2018-10-02 ENCOUNTER — Other Ambulatory Visit: Payer: Self-pay | Admitting: Endocrinology

## 2018-10-12 DIAGNOSIS — I10 Essential (primary) hypertension: Secondary | ICD-10-CM | POA: Diagnosis not present

## 2018-10-12 DIAGNOSIS — E118 Type 2 diabetes mellitus with unspecified complications: Secondary | ICD-10-CM | POA: Diagnosis not present

## 2018-10-12 DIAGNOSIS — I2510 Atherosclerotic heart disease of native coronary artery without angina pectoris: Secondary | ICD-10-CM | POA: Diagnosis not present

## 2018-10-12 DIAGNOSIS — J45909 Unspecified asthma, uncomplicated: Secondary | ICD-10-CM | POA: Diagnosis not present

## 2018-10-12 DIAGNOSIS — E119 Type 2 diabetes mellitus without complications: Secondary | ICD-10-CM | POA: Diagnosis not present

## 2018-10-12 DIAGNOSIS — E7800 Pure hypercholesterolemia, unspecified: Secondary | ICD-10-CM | POA: Diagnosis not present

## 2018-10-12 DIAGNOSIS — E11311 Type 2 diabetes mellitus with unspecified diabetic retinopathy with macular edema: Secondary | ICD-10-CM | POA: Diagnosis not present

## 2018-10-25 DIAGNOSIS — D631 Anemia in chronic kidney disease: Secondary | ICD-10-CM | POA: Diagnosis not present

## 2018-10-25 DIAGNOSIS — N1832 Chronic kidney disease, stage 3b: Secondary | ICD-10-CM | POA: Diagnosis not present

## 2018-10-25 DIAGNOSIS — Z794 Long term (current) use of insulin: Secondary | ICD-10-CM | POA: Diagnosis not present

## 2018-10-25 DIAGNOSIS — M109 Gout, unspecified: Secondary | ICD-10-CM | POA: Diagnosis not present

## 2018-10-25 DIAGNOSIS — N2581 Secondary hyperparathyroidism of renal origin: Secondary | ICD-10-CM | POA: Diagnosis not present

## 2018-10-25 DIAGNOSIS — E1122 Type 2 diabetes mellitus with diabetic chronic kidney disease: Secondary | ICD-10-CM | POA: Diagnosis not present

## 2018-10-25 DIAGNOSIS — N189 Chronic kidney disease, unspecified: Secondary | ICD-10-CM | POA: Diagnosis not present

## 2018-10-25 DIAGNOSIS — I129 Hypertensive chronic kidney disease with stage 1 through stage 4 chronic kidney disease, or unspecified chronic kidney disease: Secondary | ICD-10-CM | POA: Diagnosis not present

## 2018-11-01 ENCOUNTER — Other Ambulatory Visit: Payer: Self-pay | Admitting: Endocrinology

## 2018-11-07 DIAGNOSIS — H5213 Myopia, bilateral: Secondary | ICD-10-CM | POA: Diagnosis not present

## 2018-11-07 DIAGNOSIS — H52203 Unspecified astigmatism, bilateral: Secondary | ICD-10-CM | POA: Diagnosis not present

## 2018-11-07 DIAGNOSIS — E113313 Type 2 diabetes mellitus with moderate nonproliferative diabetic retinopathy with macular edema, bilateral: Secondary | ICD-10-CM | POA: Diagnosis not present

## 2018-11-07 DIAGNOSIS — H2513 Age-related nuclear cataract, bilateral: Secondary | ICD-10-CM | POA: Diagnosis not present

## 2018-11-07 DIAGNOSIS — H524 Presbyopia: Secondary | ICD-10-CM | POA: Diagnosis not present

## 2018-11-07 DIAGNOSIS — H25013 Cortical age-related cataract, bilateral: Secondary | ICD-10-CM | POA: Diagnosis not present

## 2018-11-07 DIAGNOSIS — H3563 Retinal hemorrhage, bilateral: Secondary | ICD-10-CM | POA: Diagnosis not present

## 2018-11-07 DIAGNOSIS — Z794 Long term (current) use of insulin: Secondary | ICD-10-CM | POA: Diagnosis not present

## 2018-11-07 DIAGNOSIS — H35353 Cystoid macular degeneration, bilateral: Secondary | ICD-10-CM | POA: Diagnosis not present

## 2018-11-13 DIAGNOSIS — N898 Other specified noninflammatory disorders of vagina: Secondary | ICD-10-CM | POA: Diagnosis not present

## 2018-11-21 DIAGNOSIS — E118 Type 2 diabetes mellitus with unspecified complications: Secondary | ICD-10-CM | POA: Diagnosis not present

## 2018-11-21 DIAGNOSIS — I10 Essential (primary) hypertension: Secondary | ICD-10-CM | POA: Diagnosis not present

## 2018-11-21 DIAGNOSIS — E7800 Pure hypercholesterolemia, unspecified: Secondary | ICD-10-CM | POA: Diagnosis not present

## 2018-11-21 DIAGNOSIS — J45909 Unspecified asthma, uncomplicated: Secondary | ICD-10-CM | POA: Diagnosis not present

## 2018-11-21 DIAGNOSIS — E11311 Type 2 diabetes mellitus with unspecified diabetic retinopathy with macular edema: Secondary | ICD-10-CM | POA: Diagnosis not present

## 2018-11-21 DIAGNOSIS — I2510 Atherosclerotic heart disease of native coronary artery without angina pectoris: Secondary | ICD-10-CM | POA: Diagnosis not present

## 2018-11-26 ENCOUNTER — Other Ambulatory Visit: Payer: Self-pay

## 2018-11-26 ENCOUNTER — Other Ambulatory Visit (INDEPENDENT_AMBULATORY_CARE_PROVIDER_SITE_OTHER): Payer: Medicare HMO

## 2018-11-26 DIAGNOSIS — H52203 Unspecified astigmatism, bilateral: Secondary | ICD-10-CM | POA: Diagnosis not present

## 2018-11-26 DIAGNOSIS — E790 Hyperuricemia without signs of inflammatory arthritis and tophaceous disease: Secondary | ICD-10-CM | POA: Diagnosis not present

## 2018-11-26 DIAGNOSIS — E1165 Type 2 diabetes mellitus with hyperglycemia: Secondary | ICD-10-CM | POA: Diagnosis not present

## 2018-11-26 DIAGNOSIS — H524 Presbyopia: Secondary | ICD-10-CM | POA: Diagnosis not present

## 2018-11-26 DIAGNOSIS — Z794 Long term (current) use of insulin: Secondary | ICD-10-CM | POA: Diagnosis not present

## 2018-11-26 DIAGNOSIS — H25013 Cortical age-related cataract, bilateral: Secondary | ICD-10-CM | POA: Diagnosis not present

## 2018-11-26 DIAGNOSIS — E113313 Type 2 diabetes mellitus with moderate nonproliferative diabetic retinopathy with macular edema, bilateral: Secondary | ICD-10-CM | POA: Diagnosis not present

## 2018-11-26 DIAGNOSIS — E782 Mixed hyperlipidemia: Secondary | ICD-10-CM

## 2018-11-26 DIAGNOSIS — H2513 Age-related nuclear cataract, bilateral: Secondary | ICD-10-CM | POA: Diagnosis not present

## 2018-11-26 LAB — LIPID PANEL
Cholesterol: 192 mg/dL (ref 0–200)
HDL: 37.1 mg/dL — ABNORMAL LOW (ref >39.00)
LDL Cholesterol: 125 mg/dL — ABNORMAL HIGH (ref 0–99)
NonHDL: 154.53
Total CHOL/HDL Ratio: 5
Triglycerides: 147 mg/dL (ref 0.0–149.0)
VLDL: 29.4 mg/dL (ref 0.0–40.0)

## 2018-11-26 LAB — MICROALBUMIN / CREATININE URINE RATIO
Creatinine,U: 122.4 mg/dL
Microalb Creat Ratio: 30.7 mg/g — ABNORMAL HIGH (ref 0.0–30.0)
Microalb, Ur: 37.5 mg/dL — ABNORMAL HIGH (ref 0.0–1.9)

## 2018-11-26 LAB — COMPREHENSIVE METABOLIC PANEL
ALT: 19 U/L (ref 0–35)
AST: 18 U/L (ref 0–37)
Albumin: 4.1 g/dL (ref 3.5–5.2)
Alkaline Phosphatase: 60 U/L (ref 39–117)
BUN: 37 mg/dL — ABNORMAL HIGH (ref 6–23)
CO2: 26 mEq/L (ref 19–32)
Calcium: 10 mg/dL (ref 8.4–10.5)
Chloride: 102 mEq/L (ref 96–112)
Creatinine, Ser: 1.36 mg/dL — ABNORMAL HIGH (ref 0.40–1.20)
GFR: 47.2 mL/min — ABNORMAL LOW (ref >60.00)
Glucose, Bld: 213 mg/dL — ABNORMAL HIGH (ref 70–99)
Potassium: 4 mEq/L (ref 3.5–5.1)
Sodium: 139 mEq/L (ref 135–145)
Total Bilirubin: 0.6 mg/dL (ref 0.2–1.2)
Total Protein: 7.7 g/dL (ref 6.0–8.3)

## 2018-11-26 LAB — URIC ACID: Uric Acid, Serum: 7.2 mg/dL — ABNORMAL HIGH (ref 2.4–7.0)

## 2018-11-26 LAB — HEMOGLOBIN A1C: Hgb A1c MFr Bld: 7.9 % — ABNORMAL HIGH (ref 4.6–6.5)

## 2018-11-28 ENCOUNTER — Encounter: Payer: Self-pay | Admitting: Endocrinology

## 2018-11-28 ENCOUNTER — Ambulatory Visit (INDEPENDENT_AMBULATORY_CARE_PROVIDER_SITE_OTHER): Payer: Medicare HMO | Admitting: Endocrinology

## 2018-11-28 VITALS — BP 140/86 | HR 96 | Ht 63.0 in | Wt 249.8 lb

## 2018-11-28 DIAGNOSIS — I10 Essential (primary) hypertension: Secondary | ICD-10-CM | POA: Diagnosis not present

## 2018-11-28 DIAGNOSIS — E790 Hyperuricemia without signs of inflammatory arthritis and tophaceous disease: Secondary | ICD-10-CM

## 2018-11-28 DIAGNOSIS — E1165 Type 2 diabetes mellitus with hyperglycemia: Secondary | ICD-10-CM

## 2018-11-28 DIAGNOSIS — Z794 Long term (current) use of insulin: Secondary | ICD-10-CM

## 2018-11-28 MED ORDER — METFORMIN HCL 1000 MG PO TABS: 1000.0000 mg | ORAL_TABLET | Freq: Two times a day (BID) | ORAL | 3 refills | Status: DC

## 2018-11-28 MED ORDER — ROSUVASTATIN CALCIUM 20 MG PO TABS: 20.0000 mg | ORAL_TABLET | Freq: Every day | ORAL | 3 refills | Status: DC

## 2018-11-28 NOTE — Patient Instructions (Addendum)
Check blood sugars on waking up days 5 a week  Also check blood sugars about 2 hours after meals and do this after different meals by rotation  Recommended blood sugar levels on waking up are 90-130 and about 2 hours after meal is 130-160  Please bring your blood sugar monitor to each visit, thank you  No hi fat foods  METFORMIN 2X DAILY  WALK DAILY  TRESIBA 26 UNITS

## 2018-11-28 NOTE — Progress Notes (Signed)
Patient ID: Stephanie [name], female   DOB: 04/19/1955, 63 y.o.   MRN: [id number]           Reason for Appointment: Followup of diabetes and hypertension  Referring physician: Hulan Fess  History of Present Illness:          Diagnosis: Type 2 diabetes mellitus, date of diagnosis: 2003       Past history:  She was initially treated metformin at some point glimepiride also added and not clear what her level of control was in the first few years. Had been only taking 500 mg twice a day of metformin and Amaryl increased to 4 mg twice a day several years ago In the last few years her blood sugar control has been more difficult and she has been managed by an endocrinologist Because of poor control she had been tried on Janumet and [place] but she states that she could not tolerate them because of headaches Had poor control with Amaryl and metformin and also difficulty with weight gain she had started Trulicity in 83/6629 on her initial consultation Her weight on her initial visit was 240 pounds. Her A1c had gone down to 7.1% in 47/65 with using Trulicity which she was tolerating at that time However in 2/16 she stopped taking Trulicity as it was causing nausea , subsequently did not want to go on [place] Because of markedly increased sugars she has been on premixed insulin since 4/16  Recent history:   INSULIN DOSE: NovoLog mix 70/30, 18 before breakfast and 48 supper  Tresiba 20 units daily  Oral hypoglycemic drugs the patient is taking are: Metformin 1 g once a day   A1c is 7.9 and higher  Last fructosamine 288   Current diabetes history, management, problems and blood sugar patterns:   She has stopped Metformin completely 2 to 3 weeks ago because she heard on a news that it was causing cancer  Her blood sugars appear to be significantly higher  Most of her sugars are being done likely before meals although she is giving inconsistent answers.  Blood sugars are higher in the  morning as before compared to evenings  Although she was told to take metformin twice a day she would only take it in the morning since she does not like to take more medication  She is still not consistently watching her diet her portions, last night had 2 sandwiches at dinnertime  Also not cutting back on high fat foods like hot dogs and bologna  She has not made any efforts to start walking for exercise  She really does not like to take brand-name medications because of cost  Asking about changing Tresiba to Lantus      Side effects from medications have been: Januvia, Onglyza apparently caused headaches, Invokana: Candidiasis   Compliance with the medical regimen: Fair    Dinner is usually at 7 pm  Glucose monitoring:  done 1 times a day or more       Glucometer: One Touch Ultra.      Blood Glucose readings by download with average readings:   PRE-MEAL Mornings Lunch Dinner Bedtime Overall  Glucose range:  223-375   144-308    Mean/median:  257   220   240   POST-MEAL PC Breakfast PC Lunch PC Dinner  Glucose range:   ?  Mean/median:      Previous readings:  PRE-MEAL  mornings Lunch Dinner Bedtime Overall  Glucose range:  141-292  92-211  139-329  92-329  Mean/median:  232   170   211   POST-MEAL PC Breakfast PC Lunch PC Dinner  Glucose range:    220-289  Mean/median:    241      Glycemic control:   Lab Results  Component Value Date   HGBA1C 7.9 (H) 11/26/2018   HGBA1C 7.5 (H) 08/22/2018   HGBA1C 7.8 (H) 03/20/2018   Lab Results  Component Value Date   MICROALBUR 37.5 (H) 11/26/2018   LDLCALC 125 (H) 11/26/2018   CREATININE 1.36 (H) 11/26/2018    Self-care: The diet that the patient has been following is: tries to limit fats usually.      Meals: 2-3 meals per day. Breakfast is cereal or scrambled eggs/sausage.   Lunch is half sandwich, yogurt and fruit; dinner usually baked chicken, bread and vegetables, snacks are fruits or nuts. Not eating out  regularly  Exercise: Minimal        Dietician visit: Most recent: 06/2015 CDE visit: 12/15              Weight history: Previous range 200-260  Wt Readings from Last 3 Encounters:  11/28/18 249 lb 12.8 oz (113.3 kg)  08/24/18 250 lb 6.4 oz (113.6 kg)  05/30/18 238 lb (108 kg)    Other active problems: discussed in review of systems   LABS:  Lab on 11/26/2018  Component Date Value Ref Range Status  . Uric Acid, Serum 11/26/2018 7.2* 2.4 - 7.0 mg/dL Final  . Microalb, Ur 11/26/2018 37.5* 0.0 - 1.9 mg/dL Final  . Creatinine,U 11/26/2018 122.4  mg/dL Final  . Microalb Creat Ratio 11/26/2018 30.7* 0.0 - 30.0 mg/g Final  . Cholesterol 11/26/2018 192  0 - 200 mg/dL Final   ATP III Classification       Desirable:  < 200 mg/dL               Borderline High:  200 - 239 mg/dL          High:  > = 240 mg/dL  . Triglycerides 11/26/2018 147.0  0.0 - 149.0 mg/dL Final   Normal:  <150 mg/dLBorderline High:  150 - 199 mg/dL  . HDL 11/26/2018 37.10* >39.00 mg/dL Final  . VLDL 11/26/2018 29.4  0.0 - 40.0 mg/dL Final  . LDL Cholesterol 11/26/2018 125* 0 - 99 mg/dL Final  . Total CHOL/HDL Ratio 11/26/2018 5   Final                  Men          Women1/2 Average Risk     3.4          3.3Average Risk          5.0          4.42X Average Risk          9.6          7.13X Average Risk          15.0          11.0                      . NonHDL 11/26/2018 154.53   Final   NOTE:  Non-HDL goal should be 30 mg/dL higher than patient's LDL goal (i.e. LDL goal of < 70 mg/dL, would have non-HDL goal of < 100 mg/dL)  . Sodium 11/26/2018 139  135 - 145 mEq/L Final  . Potassium 11/26/2018 4.0  3.5 - 5.1 mEq/L  Final  . Chloride 11/26/2018 102  96 - 112 mEq/L Final  . CO2 11/26/2018 26  19 - 32 mEq/L Final  . Glucose, Bld 11/26/2018 213* 70 - 99 mg/dL Final  . BUN 11/26/2018 37* 6 - 23 mg/dL Final  . Creatinine, Ser 11/26/2018 1.36* 0.40 - 1.20 mg/dL Final  . Total Bilirubin 11/26/2018 0.6  0.2 - 1.2 mg/dL Final   . Alkaline Phosphatase 11/26/2018 60  39 - 117 U/L Final  . AST 11/26/2018 18  0 - 37 U/L Final  . ALT 11/26/2018 19  0 - 35 U/L Final  . Total Protein 11/26/2018 7.7  6.0 - 8.3 g/dL Final  . Albumin 11/26/2018 4.1  3.5 - 5.2 g/dL Final  . Calcium 11/26/2018 10.0  8.4 - 10.5 mg/dL Final  . GFR 11/26/2018 47.20* >60.00 mL/min Final  . Hgb A1c MFr Bld 11/26/2018 7.9* 4.6 - 6.5 % Final   Glycemic Control Guidelines for People with Diabetes:Non Diabetic:  <6%Goal of Therapy: <7%Additional Action Suggested:  >8%      Allergies as of 11/28/2018      Reactions   Avapro [irbesartan] Other (See Comments)   headaches   Codeine Nausea And Vomiting   Lisinopril Itching      Medication List       Accurate as of November 28, 2018  9:20 AM. If you have any questions, ask your nurse or doctor.        STOP taking these medications   metFORMIN 1000 MG tablet Commonly known as: GLUCOPHAGE Stopped by: Elayne Snare, MD     TAKE these medications   allopurinol 100 MG tablet Commonly known as: ZYLOPRIM Take 1 tablet (100 mg total) by mouth daily. For high uric acid   atorvastatin 40 MG tablet Commonly known as: LIPITOR Take 1 tablet by mouth once daily   furosemide 40 MG tablet Commonly known as: LASIX Take 1 tablet by mouth once daily   glucose blood test strip Commonly known as: ONE TOUCH ULTRA TEST USE 1 STRIP TO CHECK GLUCOSE THREE TIMES DAILY. DX:E11.65   hydrocortisone valerate cream 0.2 % Commonly known as: WESTCORT   NovoLOG Mix 70/30 FlexPen (70-30) 100 UNIT/ML FlexPen Generic drug: insulin aspart protamine - aspart INJECT 18 UNITS UNDER THE SKIN IN THE MORNING AND 48 UNITS IN THE EVENING.   ONE TOUCH ULTRA 2 w/Device Kit 1 each by Does not apply route See admin instructions. Use as instructed to check 2-3 times daily.   potassium chloride 10 MEQ tablet Commonly known as: KLOR-CON Take 1 tablet by mouth once daily   ReliOn Pen Needles 31G X 6 MM Misc Generic drug:  Insulin Pen Needle USE TO INJECT INSULIN TWICE DAILY. DX:E11.65   Tresiba 100 UNIT/ML Soln Generic drug: Insulin Degludec Inject 20 Units into the skin daily. Inject 20 units under the skin once daily.   valsartan 160 MG tablet Commonly known as: DIOVAN Take 1 tablet by mouth once daily       Allergies:  Allergies  Allergen Reactions  . Avapro [Irbesartan] Other (See Comments)    headaches  . Codeine Nausea And Vomiting  . Lisinopril Itching    Past Medical History:  Diagnosis Date  . Abnormal Pap smear   . Asthma   . Diabetes mellitus   . Hyperlipidemia   . Hypertension   . LGSIL (low grade squamous intraepithelial dysplasia)     Past Surgical History:  Procedure Laterality Date  . ABDOMINAL HYSTERECTOMY    .  bladder surgery    . COLPOSCOPY      Family History  Problem Relation Age of Onset  . Diabetes Mother   . Diabetes Maternal Grandmother   . Heart disease Neg Hx   . Hypertension Neg Hx     Social History:  reports that she has never smoked. She has never used smokeless tobacco. She reports that she does not drink alcohol or use drugs.    Review of Systems   EDEMA: She has a history of significant edema, taking 40 mg Lasix    HYPERURICEMIA:  This has been significantly high and she was given allopurinol since she was likely having gout previously Uric acid is improved and she is continuing allopurinol 100 mg  Lab Results  Component Value Date   LABURIC 7.2 (H) 11/26/2018      Hypertension:   She is taking valsartan 160 mg Appears to be not taking the Catapres that was added on the previous visit  Recent home blood pressure:  not checked  BP Readings from Last 3 Encounters:  11/28/18 140/86  08/24/18 140/70  05/30/18 (!) 199/87   Renal function has been variable slightly better now  On Lasix 40 for edema and also on potassium supplements  Lab Results  Component Value Date   CREATININE 1.36 (H) 11/26/2018   CREATININE 1.41 (H)  08/22/2018   CREATININE 1.56 (H) 05/21/2018        Lipids:    LDL has been previously high, around 180 baseline She has been regular with her Lipitor but her LDL is still over 100       Lab Results  Component Value Date   CHOL 192 11/26/2018   HDL 37.10 (L) 11/26/2018   LDLCALC 125 (H) 11/26/2018   LDLDIRECT 89.0 12/05/2016   TRIG 147.0 11/26/2018   CHOLHDL 5 11/26/2018                Diabetic foot exam last done in 02/2018 by PCP  No history of hypothyroidism, last TSH in 7/19 by PCP normal  No results found for: TSH, FREET4   Complications from diabetes: Proliferative retinopathy, microalbuminuria  She refuses to take the flu vaccine    Physical Examination:  BP 140/86 (BP Location: Left Arm, Patient Position: Sitting, Cuff Size: Normal)   Pulse 96   Ht '5\' 3"'$  (1.6 m)   Wt 249 lb 12.8 oz (113.3 kg)   SpO2 95%   BMI 44.25 kg/m    1+ edema present   ASSESSMENT:  Diabetes type 2, with obesity, insulin requiring  See history of present illness for detailed discussion of his current management, blood sugar patterns and problems identified  Her A1c is 7.9  Her blood sugars are again higher than expected for her A1c Blood sugars are going up even more than last time partly because of her stopping Metformin She can do significantly better with diet and exercise Previously had tried Invokana but had yeast infections and did not want to go back on it Also does not like to take brand-name medication such as GLP-1 drugs  Hypertension: Blood pressure is high normal   Edema: Fairly well controlled with only 40 mg Lasix     PLAN for diabetes:   Since she has significant insulin resistance may benefit from an insulin pump For simplicity she can try the V-go pump with the 40 unit basal Have shown her the sample of the insulin pump and discussed how it works as well as how it  would delivered basal and bolus insulin He agrees to try this with a sample She will  be scheduled by the nurse educator for training  She needs to improve her diet significantly and given her a list of lower fat foods to use Encourage her to start walking daily Discussed safety of metformin and the fact that she is not taking extended release Metformin Encouraged her to take this twice daily for improving insulin sensitivity especially in the evenings She needs to cut back on her portions and starches In the meantime increase Tresiba to 28 units Explained to her that Tyler Aas will be better the Lantus that she is asking about  Hyperuricemia: Uric acid 7.2, now she will continue 100 mg) on   HYPERLIPIDEMIA: Needs better control and we will switch her to Crestor 20 mg instead of Lipitor Given list of high saturated fat foods to avoid Discussed cholesterol targets   HYPERTENSION with mild microalbuminuria: Monitor on current regimen, may consider starting back on her clonidine  There are no Patient Instructions on file for this visit.   Total visit time for evaluation and management of multiple problems and counseling =25 minutes   Elayne Snare 11/28/2018, 9:20 AM   Note: This office note was prepared with Dragon voice recognition system technology. Any transcriptional errors that result from this process are unintentional.

## 2018-12-11 ENCOUNTER — Telehealth: Payer: Self-pay | Admitting: Nutrition

## 2018-12-11 ENCOUNTER — Telehealth: Payer: Self-pay | Admitting: Endocrinology

## 2018-12-11 ENCOUNTER — Encounter: Payer: Medicare HMO | Admitting: Nutrition

## 2018-12-11 NOTE — Telephone Encounter (Signed)
She will reduce the suppertime dose from 48 down to 44 units. Also needs to check sugars before breakfast or 2 hours after one of her meals daily She needs to come back in 3 weeks for follow-up, please tell her to call for an appointment without labs

## 2018-12-11 NOTE — Telephone Encounter (Signed)
Is she having a low sugar every night or just last night from unusual meal type?

## 2018-12-11 NOTE — Telephone Encounter (Signed)
Patient called to advise that her blood sugars are dropping at night  Last night example:  @ dinner 117, then 84, 64  Patient feels her medication is too much or maybe needs to be adjusted in someway so that she does not have such consistent lows  Please advise patient at [phone]

## 2018-12-11 NOTE — Telephone Encounter (Signed)
Patient was told to reduce the dose per Dr. Ronnie Derby instruction.  She re verbalized the 44u correctly.  We reviewed the low blood sugar protocol for her, and she reported good understanding of this with no final questions.

## 2018-12-11 NOTE — Telephone Encounter (Signed)
Patient reports that she has had 3 0r 4 lows after supper in the last week

## 2018-12-11 NOTE — Telephone Encounter (Signed)
Patient reports low blood sugar 3 hours after dinner last night.  She took 48u of 70/30 at 7PM with 21 Tresiba and at 9:30 dropped to 84.  After one piece of candy, she dropped to 64,   30 min. Later.  She ate the same amount for dinner as usual, and did not extra exercise.  Please advise Also she has chosen not to do the V-go.  Says is looks too complicated.  She has not taken any AM insulin.  Is waiting for you to call her back

## 2018-12-13 DIAGNOSIS — I10 Essential (primary) hypertension: Secondary | ICD-10-CM | POA: Diagnosis not present

## 2018-12-13 DIAGNOSIS — E7800 Pure hypercholesterolemia, unspecified: Secondary | ICD-10-CM | POA: Diagnosis not present

## 2018-12-13 DIAGNOSIS — I2510 Atherosclerotic heart disease of native coronary artery without angina pectoris: Secondary | ICD-10-CM | POA: Diagnosis not present

## 2018-12-13 DIAGNOSIS — E118 Type 2 diabetes mellitus with unspecified complications: Secondary | ICD-10-CM | POA: Diagnosis not present

## 2018-12-13 DIAGNOSIS — E11311 Type 2 diabetes mellitus with unspecified diabetic retinopathy with macular edema: Secondary | ICD-10-CM | POA: Diagnosis not present

## 2018-12-13 DIAGNOSIS — J45909 Unspecified asthma, uncomplicated: Secondary | ICD-10-CM | POA: Diagnosis not present

## 2018-12-31 DIAGNOSIS — R05 Cough: Secondary | ICD-10-CM | POA: Diagnosis not present

## 2019-01-01 ENCOUNTER — Other Ambulatory Visit: Payer: Self-pay | Admitting: Endocrinology

## 2019-01-01 DIAGNOSIS — R05 Cough: Secondary | ICD-10-CM | POA: Diagnosis not present

## 2019-01-01 DIAGNOSIS — E1165 Type 2 diabetes mellitus with hyperglycemia: Secondary | ICD-10-CM

## 2019-01-01 DIAGNOSIS — E782 Mixed hyperlipidemia: Secondary | ICD-10-CM

## 2019-01-04 ENCOUNTER — Other Ambulatory Visit: Payer: Self-pay

## 2019-01-04 ENCOUNTER — Other Ambulatory Visit (INDEPENDENT_AMBULATORY_CARE_PROVIDER_SITE_OTHER): Payer: Medicare HMO

## 2019-01-04 DIAGNOSIS — E1165 Type 2 diabetes mellitus with hyperglycemia: Secondary | ICD-10-CM | POA: Diagnosis not present

## 2019-01-04 DIAGNOSIS — E782 Mixed hyperlipidemia: Secondary | ICD-10-CM | POA: Diagnosis not present

## 2019-01-04 DIAGNOSIS — Z794 Long term (current) use of insulin: Secondary | ICD-10-CM | POA: Diagnosis not present

## 2019-01-04 LAB — COMPREHENSIVE METABOLIC PANEL
ALT: 15 U/L (ref 0–35)
AST: 16 U/L (ref 0–37)
Albumin: 3.6 g/dL (ref 3.5–5.2)
Alkaline Phosphatase: 47 U/L (ref 39–117)
BUN: 39 mg/dL — ABNORMAL HIGH (ref 6–23)
CO2: 22 mEq/L (ref 19–32)
Calcium: 9.5 mg/dL (ref 8.4–10.5)
Chloride: 109 mEq/L (ref 96–112)
Creatinine, Ser: 1.48 mg/dL — ABNORMAL HIGH (ref 0.40–1.20)
GFR: 42.79 mL/min — ABNORMAL LOW (ref >60.00)
Glucose, Bld: 168 mg/dL — ABNORMAL HIGH (ref 70–99)
Potassium: 4.8 mEq/L (ref 3.5–5.1)
Sodium: 140 mEq/L (ref 135–145)
Total Bilirubin: 0.5 mg/dL (ref 0.2–1.2)
Total Protein: 8 g/dL (ref 6.0–8.3)

## 2019-01-04 LAB — LIPID PANEL
Cholesterol: 170 mg/dL (ref 0–200)
HDL: 24.2 mg/dL — ABNORMAL LOW (ref >39.00)
LDL Cholesterol: 114 mg/dL — ABNORMAL HIGH (ref 0–99)
NonHDL: 145.5
Total CHOL/HDL Ratio: 7
Triglycerides: 160 mg/dL — ABNORMAL HIGH (ref 0.0–149.0)
VLDL: 32 mg/dL (ref 0.0–40.0)

## 2019-01-05 LAB — FRUCTOSAMINE: Fructosamine: 232 umol/L (ref 0–285)

## 2019-01-08 ENCOUNTER — Encounter: Payer: Self-pay | Admitting: Endocrinology

## 2019-01-08 ENCOUNTER — Other Ambulatory Visit: Payer: Self-pay

## 2019-01-08 ENCOUNTER — Ambulatory Visit (INDEPENDENT_AMBULATORY_CARE_PROVIDER_SITE_OTHER): Payer: Medicare HMO | Admitting: Endocrinology

## 2019-01-08 VITALS — BP 140/80 | HR 110 | Ht 63.0 in | Wt 223.2 lb

## 2019-01-08 DIAGNOSIS — E113599 Type 2 diabetes mellitus with proliferative diabetic retinopathy without macular edema, unspecified eye: Secondary | ICD-10-CM | POA: Diagnosis not present

## 2019-01-08 DIAGNOSIS — N289 Disorder of kidney and ureter, unspecified: Secondary | ICD-10-CM | POA: Diagnosis not present

## 2019-01-08 DIAGNOSIS — Z794 Long term (current) use of insulin: Secondary | ICD-10-CM | POA: Diagnosis not present

## 2019-01-08 DIAGNOSIS — E782 Mixed hyperlipidemia: Secondary | ICD-10-CM | POA: Diagnosis not present

## 2019-01-08 DIAGNOSIS — E1165 Type 2 diabetes mellitus with hyperglycemia: Secondary | ICD-10-CM

## 2019-01-08 NOTE — Patient Instructions (Addendum)
Check blood sugars on waking up 3-4 days a week  Also check blood sugars about 2 hours after meals and do this after different meals by rotation  Recommended blood sugar levels on waking up are 90-130 and about 2 hours after meal is 130-180  Please bring your blood sugar monitor to each visit, thank you  Tresiba 24 units and  Pm Novolog mix 40 units  Reduce am insulin to 14 in am  Lasix 20mg  or 1/2 pill daily

## 2019-01-08 NOTE — Progress Notes (Signed)
Patient ID: Stephanie [name], female   DOB: 1955/06/21, 63 y.o.   MRN: [id number]           Reason for Appointment: Followup of diabetes and hypertension  Referring physician: Hulan Fess  History of Present Illness:          Diagnosis: Type 2 diabetes mellitus, date of diagnosis: 2003       Past history:  She was initially treated metformin at some point glimepiride also added and not clear what her level of control was in the first few years. Had been only taking 500 mg twice a day of metformin and Amaryl increased to 4 mg twice a day several years ago In the last few years her blood sugar control has been more difficult and she has been managed by an endocrinologist Because of poor control she had been tried on Janumet and [place] but she states that she could not tolerate them because of headaches Had poor control with Amaryl and metformin and also difficulty with weight gain she had started Trulicity in 40/9811 on her initial consultation Her weight on her initial visit was 240 pounds. Her A1c had gone down to 7.1% in 91/47 with using Trulicity which she was tolerating at that time However in 2/16 she stopped taking Trulicity as it was causing nausea , subsequently did not want to go on [place] Because of markedly increased sugars she has been on premixed insulin since 4/16  Recent history:   INSULIN DOSE: NovoLog mix 70/30, 18 before breakfast and 48 units before supper  Tresiba 26 units daily  Oral hypoglycemic drugs the patient is taking are: Metformin 1 g 2x a day   A1c is 7.9 and this was in 11/20  Previous fructosamine 288 and now 232   Current diabetes history, management, problems and blood sugar patterns:   She has been recommended the V-go pump for better control and avoiding multiple injections but she refuses to consider this  However after her last visit in early 11/20 she improved her diet considerably  Also she has started taking Metformin twice a day as  directed  With this she thinks she is not getting as already  Previously was eating large meals, high fat foods regularly  And only a short time she has lost 26 pounds although she was also having edema previously  She had called about 2 weeks ago for symptoms of low sugars after dinner with readings as low as 64 and her evening insulin was reduced by 4 units  Her blood sugars are now averaging only 123 compared to 240 on the last visit  She cannot explain why her blood sugars were high for 3 days between 12/9 and 12/11  She is asking about whether she should take any insulin when her blood sugars are in the normal range  However she is not doing any formal walking or exercise      Side effects from medications have been: Januvia, Onglyza apparently caused headaches, Invokana: Candidiasis   Compliance with the medical regimen: Fair    Dinner is usually at 7 pm  Glucose monitoring:  done 1 times a day or more       Glucometer: One Touch Ultra.      Blood Glucose readings by download with average readings:   PRE-MEAL Fasting  midday Dinner Bedtime Overall  Glucose range:  80-209  91-171  96-151    Mean/median:  138   140   123   POST-MEAL PC  Breakfast PC Lunch PC Dinner  Glucose range:    166, 221  Mean/median:      PREVIOUS readings:  PRE-MEAL Mornings Lunch Dinner Bedtime Overall  Glucose range:  223-375   144-308    Mean/median:  257   220   240   POST-MEAL PC Breakfast PC Lunch PC Dinner  Glucose range:   ?  Mean/median:      Glycemic control:   Lab Results  Component Value Date   HGBA1C 7.9 (H) 11/26/2018   HGBA1C 7.5 (H) 08/22/2018   HGBA1C 7.8 (H) 03/20/2018   Lab Results  Component Value Date   MICROALBUR 37.5 (H) 11/26/2018   LDLCALC 114 (H) 01/04/2019   CREATININE 1.48 (H) 01/04/2019    Self-care: The diet that the patient has been following is: tries to limit fats usually.      Meals: 2-3 meals per day. Breakfast is cereal or scrambled  eggs/sausage.   Lunch is half sandwich, yogurt and fruit; dinner usually baked chicken, bread and vegetables, snacks are fruits or nuts. Not eating out regularly  Exercise: Minimal        Dietician visit: Most recent: 06/2015 CDE visit: 12/15              Weight history: Previous range 200-260  Wt Readings from Last 3 Encounters:  01/08/19 223 lb 3.2 oz (101.2 kg)  11/28/18 249 lb 12.8 oz (113.3 kg)  08/24/18 250 lb 6.4 oz (113.6 kg)    Other active problems: discussed in review of systems   LABS:  Lab on 01/04/2019  Component Date Value Ref Range Status  . Sodium 01/04/2019 140  135 - 145 mEq/L Final  . Potassium 01/04/2019 4.8  3.5 - 5.1 mEq/L Final  . Chloride 01/04/2019 109  96 - 112 mEq/L Final  . CO2 01/04/2019 22  19 - 32 mEq/L Final  . Glucose, Bld 01/04/2019 168* 70 - 99 mg/dL Final  . BUN 01/04/2019 39* 6 - 23 mg/dL Final  . Creatinine, Ser 01/04/2019 1.48* 0.40 - 1.20 mg/dL Final  . Total Bilirubin 01/04/2019 0.5  0.2 - 1.2 mg/dL Final  . Alkaline Phosphatase 01/04/2019 47  39 - 117 U/L Final  . AST 01/04/2019 16  0 - 37 U/L Final  . ALT 01/04/2019 15  0 - 35 U/L Final  . Total Protein 01/04/2019 8.0  6.0 - 8.3 g/dL Final  . Albumin 01/04/2019 3.6  3.5 - 5.2 g/dL Final  . GFR 01/04/2019 42.79* >60.00 mL/min Final  . Calcium 01/04/2019 9.5  8.4 - 10.5 mg/dL Final  . Cholesterol 01/04/2019 170  0 - 200 mg/dL Final   ATP III Classification       Desirable:  < 200 mg/dL               Borderline High:  200 - 239 mg/dL          High:  > = 240 mg/dL  . Triglycerides 01/04/2019 160.0* 0.0 - 149.0 mg/dL Final   Normal:  <150 mg/dLBorderline High:  150 - 199 mg/dL  . HDL 01/04/2019 24.20* >39.00 mg/dL Final  . VLDL 01/04/2019 32.0  0.0 - 40.0 mg/dL Final  . LDL Cholesterol 01/04/2019 114* 0 - 99 mg/dL Final  . Total CHOL/HDL Ratio 01/04/2019 7   Final                  Men          Women1/2 Average Risk  3.4          3.3Average Risk          5.0          4.42X Average  Risk          9.6          7.13X Average Risk          15.0          11.0                      . NonHDL 01/04/2019 145.50   Final   NOTE:  Non-HDL goal should be 30 mg/dL higher than patient's LDL goal (i.e. LDL goal of < 70 mg/dL, would have non-HDL goal of < 100 mg/dL)  . Fructosamine 01/04/2019 232  0 - 285 umol/L Final   Comment: Published reference interval for apparently healthy subjects between age 8 and 28 is 23 - 285 umol/L and in a poorly controlled diabetic population is 228 - 563 umol/L with a mean of 396 umol/L.      Allergies as of 01/08/2019      Reactions   Avapro [irbesartan] Other (See Comments)   headaches   Codeine Nausea And Vomiting   Lisinopril Itching      Medication List       Accurate as of January 08, 2019  2:15 PM. If you have any questions, ask your nurse or doctor.        allopurinol 100 MG tablet Commonly known as: ZYLOPRIM Take 1 tablet (100 mg total) by mouth daily. For high uric acid   furosemide 40 MG tablet Commonly known as: LASIX Take 1 tablet by mouth once daily   glucose blood test strip Commonly known as: ONE TOUCH ULTRA TEST USE 1 STRIP TO CHECK GLUCOSE THREE TIMES DAILY. DX:E11.65   hydrocortisone valerate cream 0.2 % Commonly known as: WESTCORT   metFORMIN 1000 MG tablet Commonly known as: GLUCOPHAGE Take 1 tablet (1,000 mg total) by mouth 2 (two) times daily with a meal.   NovoLOG Mix 70/30 FlexPen (70-30) 100 UNIT/ML FlexPen Generic drug: insulin aspart protamine - aspart Inject into the skin 2 (two) times daily. Inject 18 units under the skin in the morning and 44 units in the evening. What changed: Another medication with the same name was removed. Continue taking this medication, and follow the directions you see here. Changed by: Elayne Snare, MD   ONE TOUCH ULTRA 2 w/Device Kit 1 each by Does not apply route See admin instructions. Use as instructed to check 2-3 times daily.   potassium chloride 10 MEQ  tablet Commonly known as: KLOR-CON Take 1 tablet by mouth once daily   ReliOn Pen Needles 31G X 6 MM Misc Generic drug: Insulin Pen Needle USE TO INJECT INSULIN TWICE DAILY. DX:E11.65   rosuvastatin 20 MG tablet Commonly known as: Crestor Take 1 tablet (20 mg total) by mouth daily.   Tyler Aas FlexTouch 100 UNIT/ML Sopn FlexTouch Pen Generic drug: insulin degludec Inject 26 Units into the skin daily. Inject 26 units under the skin once daily. What changed: Another medication with the same name was removed. Continue taking this medication, and follow the directions you see here. Changed by: Elayne Snare, MD   valsartan 160 MG tablet Commonly known as: DIOVAN Take 1 tablet by mouth once daily       Allergies:  Allergies  Allergen Reactions  . Avapro [Irbesartan] Other (See Comments)  headaches  . Codeine Nausea And Vomiting  . Lisinopril Itching    Past Medical History:  Diagnosis Date  . Abnormal Pap smear   . Asthma   . Diabetes mellitus   . Hyperlipidemia   . Hypertension   . LGSIL (low grade squamous intraepithelial dysplasia)     Past Surgical History:  Procedure Laterality Date  . ABDOMINAL HYSTERECTOMY    . bladder surgery    . COLPOSCOPY      Family History  Problem Relation Age of Onset  . Diabetes Mother   . Diabetes Maternal Grandmother   . Heart disease Neg Hx   . Hypertension Neg Hx     Social History:  reports that she has never smoked. She has never used smokeless tobacco. She reports that she does not drink alcohol or use drugs.    Review of Systems    HYPERURICEMIA:  Uric acid has been significantly high in the past and she was given allopurinol since she was likely having gout previously Uric acid is improved and she is continuing allopurinol 100 mg  Lab Results  Component Value Date   LABURIC 7.2 (H) 11/26/2018     Hypertension:   She is taking valsartan 160 mg   Recent home blood pressure:  not checked  BP Readings from  Last 3 Encounters:  01/08/19 140/80  11/28/18 140/86  08/24/18 140/70   Renal function has been variable slightly worse now  On Lasix 40 for edema and also taking potassium supplements  Lab Results  Component Value Date   CREATININE 1.48 (H) 01/04/2019   CREATININE 1.36 (H) 11/26/2018   CREATININE 1.41 (H) 08/22/2018        Lipids:    LDL has been previously high, around 180 baseline She has been regular with her Crestor but her LDL is still over 100, slightly better than last month       Lab Results  Component Value Date   CHOL 170 01/04/2019   CHOL 192 11/26/2018   CHOL 179 01/02/2018   Lab Results  Component Value Date   HDL 24.20 (L) 01/04/2019   HDL 37.10 (L) 11/26/2018   HDL 47.50 01/02/2018   Lab Results  Component Value Date   LDLCALC 114 (H) 01/04/2019   LDLCALC 125 (H) 11/26/2018   LDLCALC 110 (H) 01/02/2018   Lab Results  Component Value Date   TRIG 160.0 (H) 01/04/2019   TRIG 147.0 11/26/2018   TRIG 105.0 01/02/2018   Lab Results  Component Value Date   CHOLHDL 7 01/04/2019   CHOLHDL 5 11/26/2018   CHOLHDL 4 01/02/2018   Lab Results  Component Value Date   LDLDIRECT 89.0 12/05/2016   LDLDIRECT 183.0 10/01/2015                 Diabetic foot exam last done in 02/2018 by PCP  No history of hypothyroidism, last TSH in 7/19 by PCP normal  No results found for: TSH, FREET4   Complications from diabetes: Proliferative retinopathy, microalbuminuria  She refuses to take the flu vaccine     Physical Examination:  BP 140/80 (BP Location: Left Arm, Patient Position: Sitting, Cuff Size: Normal)   Pulse (!) 110   Ht '5\' 3"'$  (1.6 m)   Wt 223 lb 3.2 oz (101.2 kg)   SpO2 95%   BMI 39.54 kg/m    No  edema present   ASSESSMENT:  Diabetes type 2, with obesity, insulin requiring  See history of present illness for detailed  discussion of his current management, blood sugar patterns and problems identified  Her A1c was 7.9  However her  fructosamine is significantly better at 232  Her blood sugars are dramatically better with better diet She has also increase her Metformin to twice a day Insulin dose was reduced recently with tendency to hypoglycemia after dinner However recently is not checking readings after dinner reportedly Blood sugars are excellent at least in the last 3 days without hypoglycemia  Hypertension: Blood pressure is high normal Most recent microalbumin was 31  Edema: This is improved and likely to be from lower sodium intake Since her creatinine is relatively higher likely needs less Lasix    PLAN for diabetes:   She will reduce her insulin doses further by 4 units on the NovoLog mix and 2 units on the [place] She does need to check blood sugars much more regularly after meals and does not have to check every morning Discussed potential for hypoglycemia and to let us know if she has low sugars again  Explained that since she is still relatively insulin deficient she needs to take her mealtime dose of NovoLog mix regardless of Premeal blood sugar and not skip doses Encourage her to walk for exercise Continue excellent diet    Edema: This has resolved with low sodium intake and she can cut her Lasix in half   HYPERLIPIDEMIA: Slightly improved with Crestor instead of Lipitor but LDL still high, will continue to monitor and may consider increasing the dose  HYPERTENSION with mild microalbuminuria: Fair control and will continue to monitor  Total visit time for evaluation and management of multiple problems and counseling =25 minutes  Patient Instructions  Check blood sugars on waking up 3-4 days a week  Also check blood sugars about 2 hours after meals and do this after different meals by rotation  Recommended blood sugar levels on waking up are 90-130 and about 2 hours after meal is 130-180  Please bring your blood sugar monitor to each visit, thank you  Tresiba 24 units and  Pm Novolog  mix 40 units  Reduce am insulin to 14 in am  Lasix '20mg'$  or 1/2 pill daily      Elayne Snare 01/08/2019, 2:15 PM   Note: This office note was prepared with Dragon voice recognition system technology. Any transcriptional errors that result from this process are unintentional.

## 2019-01-16 DIAGNOSIS — E11311 Type 2 diabetes mellitus with unspecified diabetic retinopathy with macular edema: Secondary | ICD-10-CM | POA: Diagnosis not present

## 2019-01-16 DIAGNOSIS — Z794 Long term (current) use of insulin: Secondary | ICD-10-CM | POA: Diagnosis not present

## 2019-01-16 DIAGNOSIS — I2510 Atherosclerotic heart disease of native coronary artery without angina pectoris: Secondary | ICD-10-CM | POA: Diagnosis not present

## 2019-01-16 DIAGNOSIS — J45909 Unspecified asthma, uncomplicated: Secondary | ICD-10-CM | POA: Diagnosis not present

## 2019-01-16 DIAGNOSIS — E7800 Pure hypercholesterolemia, unspecified: Secondary | ICD-10-CM | POA: Diagnosis not present

## 2019-01-16 DIAGNOSIS — I10 Essential (primary) hypertension: Secondary | ICD-10-CM | POA: Diagnosis not present

## 2019-01-16 DIAGNOSIS — E118 Type 2 diabetes mellitus with unspecified complications: Secondary | ICD-10-CM | POA: Diagnosis not present

## 2019-01-24 ENCOUNTER — Other Ambulatory Visit: Payer: Self-pay | Admitting: Endocrinology

## 2019-01-28 DIAGNOSIS — H52203 Unspecified astigmatism, bilateral: Secondary | ICD-10-CM | POA: Diagnosis not present

## 2019-01-28 DIAGNOSIS — H2513 Age-related nuclear cataract, bilateral: Secondary | ICD-10-CM | POA: Diagnosis not present

## 2019-01-28 DIAGNOSIS — H35033 Hypertensive retinopathy, bilateral: Secondary | ICD-10-CM | POA: Diagnosis not present

## 2019-01-28 DIAGNOSIS — E113313 Type 2 diabetes mellitus with moderate nonproliferative diabetic retinopathy with macular edema, bilateral: Secondary | ICD-10-CM | POA: Diagnosis not present

## 2019-01-28 DIAGNOSIS — H524 Presbyopia: Secondary | ICD-10-CM | POA: Diagnosis not present

## 2019-01-28 DIAGNOSIS — H25013 Cortical age-related cataract, bilateral: Secondary | ICD-10-CM | POA: Diagnosis not present

## 2019-01-29 ENCOUNTER — Telehealth: Payer: Self-pay

## 2019-01-29 NOTE — Telephone Encounter (Signed)
PA initiated via [email or web address] for Rosuvastatin tablets.  Mallory Shirk Key: [id number] - PA Case ID: [id number] - Rx #: [id number] Need help? Call us at [phone] Status Sent to [facility] Rosuvastatin Calcium 20MG  tablets Form [facility] Electronic PA Form [employer] Info [phone]

## 2019-01-30 ENCOUNTER — Other Ambulatory Visit: Payer: Self-pay

## 2019-01-30 MED ORDER — ACCU-CHEK FASTCLIX LANCETS MISC: 1 refills | Status: AC

## 2019-01-30 MED ORDER — ACCU-CHEK GUIDE VI STRP: ORAL_STRIP | 1 refills | Status: DC

## 2019-01-30 MED ORDER — ACCU-CHEK GUIDE ME W/DEVICE KIT: 1.0000 | PACK | Freq: Three times a day (TID) | 0 refills | Status: AC

## 2019-01-30 NOTE — Telephone Encounter (Signed)
Received fax from [facility] stating that pt has been approved for coverage for Rosuvastatin calcium 20mg  tablets. The authorization is good until 01/24/2020.

## 2019-02-05 ENCOUNTER — Other Ambulatory Visit: Payer: Self-pay

## 2019-02-09 ENCOUNTER — Other Ambulatory Visit: Payer: Self-pay | Admitting: Endocrinology

## 2019-02-14 DIAGNOSIS — I10 Essential (primary) hypertension: Secondary | ICD-10-CM | POA: Diagnosis not present

## 2019-02-14 DIAGNOSIS — E11311 Type 2 diabetes mellitus with unspecified diabetic retinopathy with macular edema: Secondary | ICD-10-CM | POA: Diagnosis not present

## 2019-02-14 DIAGNOSIS — I2510 Atherosclerotic heart disease of native coronary artery without angina pectoris: Secondary | ICD-10-CM | POA: Diagnosis not present

## 2019-02-14 DIAGNOSIS — E7800 Pure hypercholesterolemia, unspecified: Secondary | ICD-10-CM | POA: Diagnosis not present

## 2019-02-14 DIAGNOSIS — E119 Type 2 diabetes mellitus without complications: Secondary | ICD-10-CM | POA: Diagnosis not present

## 2019-02-14 DIAGNOSIS — J45909 Unspecified asthma, uncomplicated: Secondary | ICD-10-CM | POA: Diagnosis not present

## 2019-02-14 DIAGNOSIS — E118 Type 2 diabetes mellitus with unspecified complications: Secondary | ICD-10-CM | POA: Diagnosis not present

## 2019-03-01 ENCOUNTER — Other Ambulatory Visit: Payer: Self-pay | Admitting: Endocrinology

## 2019-03-04 ENCOUNTER — Other Ambulatory Visit: Payer: Medicare HMO

## 2019-03-06 ENCOUNTER — Ambulatory Visit: Payer: Medicare HMO | Admitting: Endocrinology

## 2019-03-24 ENCOUNTER — Ambulatory Visit: Payer: Medicare HMO | Attending: Internal Medicine

## 2019-03-24 ENCOUNTER — Ambulatory Visit: Payer: Medicare HMO

## 2019-03-24 DIAGNOSIS — Z23 Encounter for immunization: Secondary | ICD-10-CM | POA: Insufficient documentation

## 2019-03-24 NOTE — Progress Notes (Signed)
   Covid-19 Vaccination Clinic  Name:  Stephanie [name]    MRN: [id number] DOB: 01-02-1956  03/24/2019  [name] was observed post Covid-19 immunization for 15 minutes without incidence. She was provided with Vaccine Information Sheet and instruction to access the V-Safe system.   [name] was instructed to call 911 with any severe reactions post vaccine: Marland Kitchen Difficulty breathing  . Swelling of your face and throat  . A fast heartbeat  . A bad rash all over your body  . Dizziness and weakness    Immunizations Administered    Name Date Dose VIS Date Route   Pfizer COVID-19 Vaccine 03/24/2019 12:45 PM 0.3 mL 01/04/2019 Intramuscular   Manufacturer: [place]   Lot: [id number]   [place]: [id number]

## 2019-04-08 ENCOUNTER — Other Ambulatory Visit: Payer: Self-pay | Admitting: Obstetrics and Gynecology

## 2019-04-08 DIAGNOSIS — Z1231 Encounter for screening mammogram for malignant neoplasm of breast: Secondary | ICD-10-CM

## 2019-04-11 ENCOUNTER — Other Ambulatory Visit: Payer: Self-pay | Admitting: Endocrinology

## 2019-04-15 ENCOUNTER — Telehealth: Payer: Self-pay | Admitting: Endocrinology

## 2019-04-15 ENCOUNTER — Other Ambulatory Visit: Payer: Self-pay

## 2019-04-15 MED ORDER — TRESIBA FLEXTOUCH 100 UNIT/ML ~~LOC~~ SOPN: 26.0000 [IU] | PEN_INJECTOR | Freq: Every day | SUBCUTANEOUS | 1 refills | Status: DC

## 2019-04-15 NOTE — Telephone Encounter (Signed)
Rx sent 

## 2019-04-15 NOTE — Telephone Encounter (Signed)
MEDICATION: tresiba  PHARMACY:   [place], [place]. Phone:  [phone]  Fax:  [phone]      IS THIS A 90 DAY SUPPLY : patient unsure  IS PATIENT OUT OF MEDICATION: no  IF NOT; HOW MUCH IS LEFT: on her last pen now  LAST APPOINTMENT DATE: 01/08/2019  NEXT APPOINTMENT DATE: 05/01/2019  DO WE HAVE YOUR PERMISSION TO LEAVE A DETAILED MESSAGE: yes  OTHER COMMENTS:    **Let patient know to contact pharmacy at the end of the day to make sure medication is ready. **  ** Please notify patient to allow 48-72 hours to process**  **Encourage patient to contact the pharmacy for refills or they can request refills through [facility]**

## 2019-04-16 ENCOUNTER — Other Ambulatory Visit: Payer: Self-pay

## 2019-04-16 MED ORDER — TRESIBA FLEXTOUCH 100 UNIT/ML ~~LOC~~ SOPN: PEN_INJECTOR | SUBCUTANEOUS | 1 refills | Status: DC

## 2019-04-23 ENCOUNTER — Ambulatory Visit: Payer: Medicare HMO | Attending: Internal Medicine

## 2019-04-23 DIAGNOSIS — Z23 Encounter for immunization: Secondary | ICD-10-CM

## 2019-04-23 NOTE — Progress Notes (Signed)
   Covid-19 Vaccination Clinic  Name:  Stephanie [name]    MRN: [id number] DOB: 12-18-1955  04/23/2019  [name] was observed post Covid-19 immunization for 15 minutes without incident. She was provided with Vaccine Information Sheet and instruction to access the V-Safe system.   [name] was instructed to call 911 with any severe reactions post vaccine: Marland Kitchen Difficulty breathing  . Swelling of face and throat  . A fast heartbeat  . A bad rash all over body  . Dizziness and weakness   Immunizations Administered    Name Date Dose VIS Date Route   Pfizer COVID-19 Vaccine 04/23/2019  4:15 PM 0.3 mL 01/04/2019 Intramuscular   Manufacturer: [place]   Lot: [id number]   [place]: [id number]

## 2019-04-29 ENCOUNTER — Other Ambulatory Visit (INDEPENDENT_AMBULATORY_CARE_PROVIDER_SITE_OTHER): Payer: Medicare HMO

## 2019-04-29 ENCOUNTER — Other Ambulatory Visit: Payer: Self-pay

## 2019-04-29 DIAGNOSIS — Z794 Long term (current) use of insulin: Secondary | ICD-10-CM | POA: Diagnosis not present

## 2019-04-29 DIAGNOSIS — E1165 Type 2 diabetes mellitus with hyperglycemia: Secondary | ICD-10-CM | POA: Diagnosis not present

## 2019-04-29 DIAGNOSIS — E782 Mixed hyperlipidemia: Secondary | ICD-10-CM | POA: Diagnosis not present

## 2019-04-29 LAB — COMPREHENSIVE METABOLIC PANEL
ALT: 15 U/L (ref 0–35)
AST: 18 U/L (ref 0–37)
Albumin: 4.3 g/dL (ref 3.5–5.2)
Alkaline Phosphatase: 48 U/L (ref 39–117)
BUN: 34 mg/dL — ABNORMAL HIGH (ref 6–23)
CO2: 26 mEq/L (ref 19–32)
Calcium: 10.2 mg/dL (ref 8.4–10.5)
Chloride: 102 mEq/L (ref 96–112)
Creatinine, Ser: 2.01 mg/dL — ABNORMAL HIGH (ref 0.40–1.20)
GFR: 30.03 mL/min — ABNORMAL LOW (ref >60.00)
Glucose, Bld: 82 mg/dL (ref 70–99)
Potassium: 4.1 mEq/L (ref 3.5–5.1)
Sodium: 139 mEq/L (ref 135–145)
Total Bilirubin: 0.4 mg/dL (ref 0.2–1.2)
Total Protein: 8.1 g/dL (ref 6.0–8.3)

## 2019-04-29 LAB — LIPID PANEL
Cholesterol: 163 mg/dL (ref 0–200)
HDL: 46.7 mg/dL (ref >39.00)
LDL Cholesterol: 95 mg/dL (ref 0–99)
NonHDL: 116.42
Total CHOL/HDL Ratio: 3
Triglycerides: 106 mg/dL (ref 0.0–149.0)
VLDL: 21.2 mg/dL (ref 0.0–40.0)

## 2019-04-29 LAB — HEMOGLOBIN A1C: Hgb A1c MFr Bld: 7.3 % — ABNORMAL HIGH (ref 4.6–6.5)

## 2019-05-01 ENCOUNTER — Encounter: Payer: Self-pay | Admitting: Endocrinology

## 2019-05-01 ENCOUNTER — Ambulatory Visit (INDEPENDENT_AMBULATORY_CARE_PROVIDER_SITE_OTHER): Payer: Medicare HMO | Admitting: Endocrinology

## 2019-05-01 VITALS — BP 142/80 | HR 104 | Ht 63.0 in | Wt 240.2 lb

## 2019-05-01 DIAGNOSIS — N289 Disorder of kidney and ureter, unspecified: Secondary | ICD-10-CM

## 2019-05-01 DIAGNOSIS — I10 Essential (primary) hypertension: Secondary | ICD-10-CM

## 2019-05-01 DIAGNOSIS — R600 Localized edema: Secondary | ICD-10-CM

## 2019-05-01 DIAGNOSIS — E782 Mixed hyperlipidemia: Secondary | ICD-10-CM

## 2019-05-01 DIAGNOSIS — E1165 Type 2 diabetes mellitus with hyperglycemia: Secondary | ICD-10-CM | POA: Diagnosis not present

## 2019-05-01 DIAGNOSIS — Z6841 Body Mass Index (BMI) 40.0 and over, adult: Secondary | ICD-10-CM | POA: Diagnosis not present

## 2019-05-01 DIAGNOSIS — Z794 Long term (current) use of insulin: Secondary | ICD-10-CM

## 2019-05-01 DIAGNOSIS — E1169 Type 2 diabetes mellitus with other specified complication: Secondary | ICD-10-CM | POA: Diagnosis not present

## 2019-05-01 DIAGNOSIS — E113599 Type 2 diabetes mellitus with proliferative diabetic retinopathy without macular edema, unspecified eye: Secondary | ICD-10-CM | POA: Diagnosis not present

## 2019-05-01 MED ORDER — LABETALOL HCL 100 MG PO TABS: 100.0000 mg | ORAL_TABLET | Freq: Two times a day (BID) | ORAL | 1 refills | Status: DC

## 2019-05-01 NOTE — Progress Notes (Signed)
Patient ID: Stephanie [name], female   DOB: [birth year], 64 y.o.   MRN: [id number]           Reason for Appointment: Followup of diabetes and hypertension  Referring physician: Hulan Fess  History of Present Illness:          Diagnosis: Type 2 diabetes mellitus, date of diagnosis: 2003       Past history:  She was initially treated metformin at some point glimepiride also added and not clear what her level of control was in the first few years. Had been only taking 500 mg twice a day of metformin and Amaryl increased to 4 mg twice a day several years ago In the last few years her blood sugar control has been more difficult and she has been managed by an endocrinologist Because of poor control she had been tried on Janumet and [place] but she states that she could not tolerate them because of headaches Had poor control with Amaryl and metformin and also difficulty with weight gain she had started Trulicity in 19/3790 on her initial consultation Her weight on her initial visit was 240 pounds. Her A1c had gone down to 7.1% in 24/09 with using Trulicity which she was tolerating at that time However in 2/16 she stopped taking Trulicity as it was causing nausea , subsequently did not want to go on [place] Because of markedly increased sugars she has been on premixed insulin since 4/16  Recent history:   INSULIN DOSE: NovoLog mix 70/30, 14 before breakfast and 40units before supper  Tresiba 24 units daily  Oral hypoglycemic drugs the patient is taking are: Metformin 1 g 2x a day   A1c is 7.3 compared to 7.9    Current diabetes history, management, problems and blood sugar patterns:   She has been generally trying to follow a better diet which improved control on the last visit  However she will still periodically go off her diet with higher fat or large meals and has occasional readings over 200 at night  As before she is not remembering to check her sugars after meals as  directed  Insulin doses were reduced on the last visit especially with low sugars after dinner  She thinks she is taking her premeal insulin consistently  Has not had any further hypoglycemia at home  Glucose was 82 but she may have taken her insulin and not eating breakfast that morning  Her weight has gone up significantly again but may be having some edema also  She has not done any walking lately  She has been inconsistent with taking her Metformin twice a day  She is complaining about the cost of test trips even though she was given the Accu-Chek meter and test strips which is preferred by her insurance      Side effects from medications have been: Januvia, Onglyza apparently caused headaches, Invokana: Candidiasis   Compliance with the medical regimen: Fair    Dinner is usually at 7 pm  Glucose monitoring:  done 1 times a day or more       Glucometer: One Touch Ultra.      Blood Glucose readings by download with average readings:   PRE-MEAL Fasting Lunch Dinner Bedtime Overall  Glucose range:  103-180   114-154  150-267   Mean/median: 133    143   : Previously  PRE-MEAL Fasting  midday Dinner Bedtime Overall  Glucose range:  80-209  91-171  96-151    Mean/median:  138  140   123   POST-MEAL PC Breakfast PC Lunch PC Dinner  Glucose range:    166, 221  Mean/median:       Glycemic control:   Lab Results  Component Value Date   HGBA1C 7.3 (H) 04/29/2019   HGBA1C 7.9 (H) 11/26/2018   HGBA1C 7.5 (H) 08/22/2018   Lab Results  Component Value Date   MICROALBUR 37.5 (H) 11/26/2018   LDLCALC 95 04/29/2019   CREATININE 2.01 (H) 04/29/2019    Self-care: The diet that the patient has been following is: tries to limit fats usually.      Meals: 2-3 meals per day. Breakfast is cereal or scrambled eggs/sausage.   Lunch is half sandwich, yogurt and fruit; dinner usually baked chicken, bread and vegetables, snacks are fruits or nuts. Not eating out  regularly   Dietician visit: Most recent: 06/2015 CDE visit: 12/15              Weight history: Previous range 200-260  Wt Readings from Last 3 Encounters:  05/01/19 240 lb 3.2 oz (109 kg)  01/08/19 223 lb 3.2 oz (101.2 kg)  11/28/18 249 lb 12.8 oz (113.3 kg)    Other active problems: discussed in review of systems   LABS:  Lab on 04/29/2019  Component Date Value Ref Range Status  . Cholesterol 04/29/2019 163  0 - 200 mg/dL Final   ATP III Classification       Desirable:  < 200 mg/dL               Borderline High:  200 - 239 mg/dL          High:  > = 240 mg/dL  . Triglycerides 04/29/2019 106.0  0.0 - 149.0 mg/dL Final   Normal:  <150 mg/dLBorderline High:  150 - 199 mg/dL  . HDL 04/29/2019 46.70  >39.00 mg/dL Final  . VLDL 04/29/2019 21.2  0.0 - 40.0 mg/dL Final  . LDL Cholesterol 04/29/2019 95  0 - 99 mg/dL Final  . Total CHOL/HDL Ratio 04/29/2019 3   Final                  Men          Women1/2 Average Risk     3.4          3.3Average Risk          5.0          4.42X Average Risk          9.6          7.13X Average Risk          15.0          11.0                      . NonHDL 04/29/2019 116.42   Final   NOTE:  Non-HDL goal should be 30 mg/dL higher than patient's LDL goal (i.e. LDL goal of < 70 mg/dL, would have non-HDL goal of < 100 mg/dL)  . Sodium 04/29/2019 139  135 - 145 mEq/L Final  . Potassium 04/29/2019 4.1  3.5 - 5.1 mEq/L Final  . Chloride 04/29/2019 102  96 - 112 mEq/L Final  . CO2 04/29/2019 26  19 - 32 mEq/L Final  . Glucose, Bld 04/29/2019 82  70 - 99 mg/dL Final  . BUN 04/29/2019 34* 6 - 23 mg/dL Final  . Creatinine, Ser 04/29/2019 2.01* 0.40 - 1.20 mg/dL Final  .  Total Bilirubin 04/29/2019 0.4  0.2 - 1.2 mg/dL Final  . Alkaline Phosphatase 04/29/2019 48  39 - 117 U/L Final  . AST 04/29/2019 18  0 - 37 U/L Final  . ALT 04/29/2019 15  0 - 35 U/L Final  . Total Protein 04/29/2019 8.1  6.0 - 8.3 g/dL Final  . Albumin 04/29/2019 4.3  3.5 - 5.2 g/dL Final  .  GFR 04/29/2019 30.03* >60.00 mL/min Final  . Calcium 04/29/2019 10.2  8.4 - 10.5 mg/dL Final  . Hgb A1c MFr Bld 04/29/2019 7.3* 4.6 - 6.5 % Final   Glycemic Control Guidelines for People with Diabetes:Non Diabetic:  <6%Goal of Therapy: <7%Additional Action Suggested:  >8%      Allergies as of 05/01/2019      Reactions   Avapro [irbesartan] Other (See Comments)   headaches   Codeine Nausea And Vomiting   Lisinopril Itching      Medication List       Accurate as of May 01, 2019  4:33 PM. If you have any questions, ask your nurse or doctor.        Accu-Chek FastClix Lancets Misc Use accu chek fastclix lancets to check blood sugar 2-3 times daily. DX:E11.65   Accu-Chek Guide Me w/Device Kit 1 each by Does not apply route 3 (three) times daily. Use accu chek guide me to check blood sugar 2-3 times daily. DX:E11.65   Accu-Chek Guide test strip Generic drug: glucose blood Use Accu Chek Guide test strips as instructed to check blood sugar 2-3 times daily. DX:E11.65   allopurinol 100 MG tablet Commonly known as: ZYLOPRIM Take 1 tablet (100 mg total) by mouth daily. For high uric acid   furosemide 40 MG tablet Commonly known as: LASIX Take 1 tablet by mouth once daily   hydrocortisone valerate cream 0.2 % Commonly known as: WESTCORT   metFORMIN 1000 MG tablet Commonly known as: GLUCOPHAGE TAKE 1 TABLET BY MOUTH TWICE DAILY WITH A MEAL   NovoLOG Mix 70/30 FlexPen (70-30) 100 UNIT/ML FlexPen Generic drug: insulin aspart protamine - aspart Inject into the skin 2 (two) times daily. Inject 14 units under the skin in the morning and 40 units in the evening. What changed: Another medication with the same name was removed. Continue taking this medication, and follow the directions you see here. Changed by: Elayne Snare, MD   potassium chloride 10 MEQ tablet Commonly known as: KLOR-CON Take 1 tablet by mouth once daily   ReliOn Pen Needles 31G X 6 MM Misc Generic drug: Insulin Pen  Needle USE TO INJECT INSULIN TWICE DAILY   rosuvastatin 20 MG tablet Commonly known as: Crestor Take 1 tablet (20 mg total) by mouth daily.   Tyler Aas FlexTouch 100 UNIT/ML FlexTouch Pen Generic drug: insulin degludec Inject 24 units under the skin once daily.   valsartan 160 MG tablet Commonly known as: DIOVAN Take 1 tablet by mouth once daily       Allergies:  Allergies  Allergen Reactions  . Avapro [Irbesartan] Other (See Comments)    headaches  . Codeine Nausea And Vomiting  . Lisinopril Itching    Past Medical History:  Diagnosis Date  . Abnormal Pap smear   . Asthma   . Diabetes mellitus   . Hyperlipidemia   . Hypertension   . LGSIL (low grade squamous intraepithelial dysplasia)     Past Surgical History:  Procedure Laterality Date  . ABDOMINAL HYSTERECTOMY    . bladder surgery    . COLPOSCOPY  Family History  Problem Relation Age of Onset  . Diabetes Mother   . Diabetes Maternal Grandmother   . Heart disease Neg Hx   . Hypertension Neg Hx     Social History:  reports that she has never smoked. She has never used smokeless tobacco. She reports that she does not drink alcohol or use drugs.    Review of Systems    HYPERURICEMIA:  Uric acid has been significantly high in the past and she was given allopurinol since she was likely having gout previously Uric acid is improved and she is continuing allopurinol 100 mg  Lab Results  Component Value Date   LABURIC 7.2 (H) 11/26/2018     Hypertension:   She is taking valsartan 160 mg  Recent home blood pressure: 138/ ?  BP Readings from Last 3 Encounters:  05/01/19 (!) 142/80  01/08/19 140/80  11/28/18 140/86   Renal function has been variable but worse now She does not take any ibuprofen or naproxen OTC  She has been seen by a nephrologist once and is due to go back next month, no changes made in her management   On Lasix 40 for edema and also taking potassium supplements  Lab  Results  Component Value Date   CREATININE 2.01 (H) 04/29/2019   CREATININE 1.48 (H) 01/04/2019   CREATININE 1.36 (H) 11/26/2018        Lipids:    LDL has been previously high, around 180 baseline She has been regular with her Crestor and her LDL is now finally below 100 Diet has been somewhat better overall over the last few months       Lab Results  Component Value Date   CHOL 163 04/29/2019   CHOL 170 01/04/2019   CHOL 192 11/26/2018   Lab Results  Component Value Date   HDL 46.70 04/29/2019   HDL 24.20 (L) 01/04/2019   HDL 37.10 (L) 11/26/2018   Lab Results  Component Value Date   LDLCALC 95 04/29/2019   LDLCALC 114 (H) 01/04/2019   LDLCALC 125 (H) 11/26/2018   Lab Results  Component Value Date   TRIG 106.0 04/29/2019   TRIG 160.0 (H) 01/04/2019   TRIG 147.0 11/26/2018   Lab Results  Component Value Date   CHOLHDL 3 04/29/2019   CHOLHDL 7 01/04/2019   CHOLHDL 5 11/26/2018   Lab Results  Component Value Date   LDLDIRECT 89.0 12/05/2016   LDLDIRECT 183.0 10/01/2015                 Diabetic foot exam last done in 08/9371 by PCP   Complications from diabetes: Proliferative retinopathy, microalbuminuria  She refuses to take the flu vaccine but has taken the Covid vaccines    Physical Examination:  BP (!) 142/80 (BP Location: Left Arm, Patient Position: Sitting, Cuff Size: Normal)   Pulse (!) 104   Ht '5\' 3"'$  (1.6 m)   Wt 240 lb 3.2 oz (109 kg)   SpO2 98%   BMI 42.55 kg/m    2+ edema present   ASSESSMENT:  Diabetes type 2, with obesity, insulin requiring  See history of present illness for detailed discussion of his current management, blood sugar patterns and problems identified  Her A1c is further improved at 7.3  She checks blood sugars only before meals but does have a couple of high readings when she goes off her diet, highest 267 Her weight has gone back up and she thinks that she is not as consistent  on her diet as before Also not  doing any exercise  Hypertension: Blood pressure is again high normal  RENAL dysfunction: Etiology is unclear and she is not taking any nonsteroidal anti-inflammatory drugs Her dose of valsartan has not been changed for some time  Edema: This is again present and likely from not being consistently restricting sodium, also likely has venous insufficiency    PLAN for diabetes:   No change in insulin at this time Emphasized the need to check readings after meals to help her with identifying what foods make her sugar go up Since she needs to start working on her weight loss again she will need to start walking regularly Also cut back on overall calorie intake as before Metformin will be reduced to 1 g daily and renal function is worse, may need to stop it if renal function does not improve  Edema: She will take a full tablet of Lasix and also discussed with nephrologist   HYPERLIPIDEMIA: Overall with generally better diet her LDL is improved and now 95 She will continue Crestor and follow-up in 3 months   HYPERTENSION with mild microalbuminuria: Because of renal dysfunction she will reduce her valsartan to half tablet Start labetalol 100 mg twice daily especially since she has a relatively higher pulse rate To follow-up with nephrologist next month   There are no Patient Instructions on file for this visit.     Elayne Snare 05/01/2019, 4:33 PM   Note: This office note was prepared with Dragon voice recognition system technology. Any transcriptional errors that result from this process are unintentional.

## 2019-05-01 NOTE — Patient Instructions (Addendum)
Check blood sugars on waking up 4-5  days a week  Also check blood sugars about 2 hours after meals and do this after different meals by rotation  Recommended blood sugar levels on waking up are 90-130 and about 2 hours after meal is 130-160  Please bring your blood sugar monitor to each visit, thank you  Metformin only 1 in am  Valsartan 1/2 daily  New Rx is labetalol 2x daily

## 2019-05-03 ENCOUNTER — Other Ambulatory Visit: Payer: Self-pay | Admitting: Endocrinology

## 2019-05-06 ENCOUNTER — Other Ambulatory Visit: Payer: Self-pay

## 2019-05-06 MED ORDER — ROSUVASTATIN CALCIUM 20 MG PO TABS: 20.0000 mg | ORAL_TABLET | Freq: Every day | ORAL | 1 refills | Status: DC

## 2019-05-17 ENCOUNTER — Other Ambulatory Visit: Payer: Self-pay | Admitting: Endocrinology

## 2019-05-18 ENCOUNTER — Other Ambulatory Visit: Payer: Self-pay | Admitting: Endocrinology

## 2019-05-21 DIAGNOSIS — Z01419 Encounter for gynecological examination (general) (routine) without abnormal findings: Secondary | ICD-10-CM | POA: Diagnosis not present

## 2019-05-21 DIAGNOSIS — N898 Other specified noninflammatory disorders of vagina: Secondary | ICD-10-CM | POA: Diagnosis not present

## 2019-05-21 DIAGNOSIS — Z1211 Encounter for screening for malignant neoplasm of colon: Secondary | ICD-10-CM | POA: Diagnosis not present

## 2019-05-29 DIAGNOSIS — H2513 Age-related nuclear cataract, bilateral: Secondary | ICD-10-CM | POA: Diagnosis not present

## 2019-05-29 DIAGNOSIS — H35033 Hypertensive retinopathy, bilateral: Secondary | ICD-10-CM | POA: Diagnosis not present

## 2019-05-29 DIAGNOSIS — H35353 Cystoid macular degeneration, bilateral: Secondary | ICD-10-CM | POA: Diagnosis not present

## 2019-05-29 DIAGNOSIS — E113313 Type 2 diabetes mellitus with moderate nonproliferative diabetic retinopathy with macular edema, bilateral: Secondary | ICD-10-CM | POA: Diagnosis not present

## 2019-05-29 DIAGNOSIS — Z794 Long term (current) use of insulin: Secondary | ICD-10-CM | POA: Diagnosis not present

## 2019-05-29 DIAGNOSIS — H3554 Dystrophies primarily involving the retinal pigment epithelium: Secondary | ICD-10-CM | POA: Diagnosis not present

## 2019-06-04 DIAGNOSIS — K469 Unspecified abdominal hernia without obstruction or gangrene: Secondary | ICD-10-CM | POA: Diagnosis not present

## 2019-06-04 DIAGNOSIS — K625 Hemorrhage of anus and rectum: Secondary | ICD-10-CM | POA: Diagnosis not present

## 2019-06-04 DIAGNOSIS — K601 Chronic anal fissure: Secondary | ICD-10-CM | POA: Diagnosis not present

## 2019-06-05 ENCOUNTER — Ambulatory Visit
Admission: RE | Admit: 2019-06-05 | Discharge: 2019-06-05 | Disposition: A | Discharge status: Home / Self Care | Payer: Medicare HMO | Source: Ambulatory Visit | Attending: Obstetrics and Gynecology | Admitting: Obstetrics and Gynecology

## 2019-06-05 ENCOUNTER — Other Ambulatory Visit: Payer: Self-pay

## 2019-06-05 DIAGNOSIS — Z1231 Encounter for screening mammogram for malignant neoplasm of breast: Secondary | ICD-10-CM

## 2019-06-20 DIAGNOSIS — E11311 Type 2 diabetes mellitus with unspecified diabetic retinopathy with macular edema: Secondary | ICD-10-CM | POA: Diagnosis not present

## 2019-06-20 DIAGNOSIS — E119 Type 2 diabetes mellitus without complications: Secondary | ICD-10-CM | POA: Diagnosis not present

## 2019-06-20 DIAGNOSIS — E118 Type 2 diabetes mellitus with unspecified complications: Secondary | ICD-10-CM | POA: Diagnosis not present

## 2019-06-20 DIAGNOSIS — I2510 Atherosclerotic heart disease of native coronary artery without angina pectoris: Secondary | ICD-10-CM | POA: Diagnosis not present

## 2019-06-20 DIAGNOSIS — J45909 Unspecified asthma, uncomplicated: Secondary | ICD-10-CM | POA: Diagnosis not present

## 2019-06-20 DIAGNOSIS — E7800 Pure hypercholesterolemia, unspecified: Secondary | ICD-10-CM | POA: Diagnosis not present

## 2019-06-20 DIAGNOSIS — I10 Essential (primary) hypertension: Secondary | ICD-10-CM | POA: Diagnosis not present

## 2019-06-21 ENCOUNTER — Other Ambulatory Visit: Payer: Self-pay | Admitting: Endocrinology

## 2019-06-21 DIAGNOSIS — I129 Hypertensive chronic kidney disease with stage 1 through stage 4 chronic kidney disease, or unspecified chronic kidney disease: Secondary | ICD-10-CM | POA: Diagnosis not present

## 2019-06-21 DIAGNOSIS — M109 Gout, unspecified: Secondary | ICD-10-CM | POA: Diagnosis not present

## 2019-06-21 DIAGNOSIS — Z794 Long term (current) use of insulin: Secondary | ICD-10-CM | POA: Diagnosis not present

## 2019-06-21 DIAGNOSIS — N189 Chronic kidney disease, unspecified: Secondary | ICD-10-CM | POA: Diagnosis not present

## 2019-06-21 DIAGNOSIS — D631 Anemia in chronic kidney disease: Secondary | ICD-10-CM | POA: Diagnosis not present

## 2019-06-21 DIAGNOSIS — N2581 Secondary hyperparathyroidism of renal origin: Secondary | ICD-10-CM | POA: Diagnosis not present

## 2019-06-21 DIAGNOSIS — E1122 Type 2 diabetes mellitus with diabetic chronic kidney disease: Secondary | ICD-10-CM | POA: Diagnosis not present

## 2019-06-21 DIAGNOSIS — N1832 Chronic kidney disease, stage 3b: Secondary | ICD-10-CM | POA: Diagnosis not present

## 2019-06-26 ENCOUNTER — Other Ambulatory Visit: Payer: Medicare HMO

## 2019-06-28 ENCOUNTER — Other Ambulatory Visit: Payer: Medicare HMO

## 2019-07-01 ENCOUNTER — Other Ambulatory Visit: Payer: Self-pay

## 2019-07-01 ENCOUNTER — Other Ambulatory Visit (INDEPENDENT_AMBULATORY_CARE_PROVIDER_SITE_OTHER): Payer: Medicare HMO

## 2019-07-01 ENCOUNTER — Other Ambulatory Visit: Payer: Self-pay | Admitting: Endocrinology

## 2019-07-01 DIAGNOSIS — E7800 Pure hypercholesterolemia, unspecified: Secondary | ICD-10-CM | POA: Diagnosis not present

## 2019-07-01 DIAGNOSIS — E1165 Type 2 diabetes mellitus with hyperglycemia: Secondary | ICD-10-CM | POA: Diagnosis not present

## 2019-07-01 DIAGNOSIS — Z794 Long term (current) use of insulin: Secondary | ICD-10-CM

## 2019-07-01 DIAGNOSIS — I10 Essential (primary) hypertension: Secondary | ICD-10-CM | POA: Diagnosis not present

## 2019-07-01 DIAGNOSIS — E119 Type 2 diabetes mellitus without complications: Secondary | ICD-10-CM | POA: Diagnosis not present

## 2019-07-01 DIAGNOSIS — E118 Type 2 diabetes mellitus with unspecified complications: Secondary | ICD-10-CM | POA: Diagnosis not present

## 2019-07-01 DIAGNOSIS — E11311 Type 2 diabetes mellitus with unspecified diabetic retinopathy with macular edema: Secondary | ICD-10-CM | POA: Diagnosis not present

## 2019-07-01 DIAGNOSIS — I2510 Atherosclerotic heart disease of native coronary artery without angina pectoris: Secondary | ICD-10-CM | POA: Diagnosis not present

## 2019-07-01 DIAGNOSIS — J45909 Unspecified asthma, uncomplicated: Secondary | ICD-10-CM | POA: Diagnosis not present

## 2019-07-01 LAB — COMPREHENSIVE METABOLIC PANEL
ALT: 18 U/L (ref 0–35)
AST: 16 U/L (ref 0–37)
Albumin: 4.1 g/dL (ref 3.5–5.2)
Alkaline Phosphatase: 57 U/L (ref 39–117)
BUN: 39 mg/dL — ABNORMAL HIGH (ref 6–23)
CO2: 26 mEq/L (ref 19–32)
Calcium: 9.7 mg/dL (ref 8.4–10.5)
Chloride: 103 mEq/L (ref 96–112)
Creatinine, Ser: 1.71 mg/dL — ABNORMAL HIGH (ref 0.40–1.20)
GFR: 36.17 mL/min — ABNORMAL LOW (ref >60.00)
Glucose, Bld: 192 mg/dL — ABNORMAL HIGH (ref 70–99)
Potassium: 4.2 mEq/L (ref 3.5–5.1)
Sodium: 136 mEq/L (ref 135–145)
Total Bilirubin: 0.5 mg/dL (ref 0.2–1.2)
Total Protein: 7.5 g/dL (ref 6.0–8.3)

## 2019-07-01 LAB — HEMOGLOBIN A1C: Hgb A1c MFr Bld: 7.7 % — ABNORMAL HIGH (ref 4.6–6.5)

## 2019-07-03 ENCOUNTER — Ambulatory Visit (INDEPENDENT_AMBULATORY_CARE_PROVIDER_SITE_OTHER): Payer: Medicare HMO | Admitting: Endocrinology

## 2019-07-03 ENCOUNTER — Encounter: Payer: Self-pay | Admitting: Endocrinology

## 2019-07-03 ENCOUNTER — Other Ambulatory Visit: Payer: Self-pay

## 2019-07-03 VITALS — BP 152/82 | HR 94 | Ht 63.0 in | Wt 246.2 lb

## 2019-07-03 DIAGNOSIS — I10 Essential (primary) hypertension: Secondary | ICD-10-CM | POA: Diagnosis not present

## 2019-07-03 DIAGNOSIS — Z794 Long term (current) use of insulin: Secondary | ICD-10-CM

## 2019-07-03 DIAGNOSIS — E1165 Type 2 diabetes mellitus with hyperglycemia: Secondary | ICD-10-CM

## 2019-07-03 NOTE — Progress Notes (Signed)
Patient ID: Stephanie [name], female   DOB: 01-12-1956, 64 y.o.   MRN: [id number]           Reason for Appointment: Followup of diabetes and hypertension  Referring physician: Hulan Fess  History of Present Illness:          Diagnosis: Type 2 diabetes mellitus, date of diagnosis: 2003       Past history:  She was initially treated metformin at some point glimepiride also added and not clear what her level of control was in the first few years. Had been only taking 500 mg twice a day of metformin and Amaryl increased to 4 mg twice a day several years ago In the last few years her blood sugar control has been more difficult and she has been managed by an endocrinologist Because of poor control she had been tried on Janumet and Marion but she states that she could not tolerate them because of headaches Had poor control with Amaryl and metformin and also difficulty with weight gain she had started Trulicity in 01/5613 on her initial consultation Her weight on her initial visit was 240 pounds. Her A1c had gone down to 7.1% in 37/94 with using Trulicity which she was tolerating at that time However in 2/16 she stopped taking Trulicity as it was causing nausea , subsequently did not want to go on Tanzeum Because of markedly increased sugars she has been on premixed insulin since 4/16  Recent history:   INSULIN DOSE: NovoLog mix 70/30, 14 before breakfast and 40 units before supper  Tresiba 24 units daily  Oral hypoglycemic drugs the patient is taking are: Metformin 1 g 2x a day   A1c is 7.7 compared to 7.3    Current diabetes history, management, problems and blood sugar patterns:   She has mostly high readings in the last few days  She thinks this is from going off her diet and eating sweets because of going to graduation parties; blood sugars appear to be consistently high since 5/31  Also blood sugars are high in the mornings  She now says that sometimes she will forget her  evening insulin when her blood sugars were over 300 both morning and evening yesterday  Also she is monitoring blood sugars somewhat erratically and mostly in the mornings at various times  Not clear if her blood sugars are controlled after dinner with only 1 late evening blood sugar  Sometimes she will not eat a meal until midday in the morning  She continues to progressively gain weight and is asking for a weight loss medicine to suppress her appetite  Currently not doing any exercise or walking      Side effects from medications have been: Januvia, Onglyza apparently caused headaches, Invokana: Candidiasis   Compliance with the medical regimen: Fair    Dinner is usually at 7 pm  Glucose monitoring:  done 1 times a day or more       Glucometer: One Touch Ultra.      Blood Glucose readings by download with average readings:   PRE-MEAL Fasting Lunch Dinner Bedtime Overall  Glucose range:  134-323   138-327  114   Mean/median:  200    200   Previous readings:  PRE-MEAL Fasting Lunch Dinner Bedtime Overall  Glucose range:  103-180   114-154  150-267   Mean/median: 133    143    Glycemic control:   Lab Results  Component Value Date   HGBA1C 7.7 (H) 07/01/2019  HGBA1C 7.3 (H) 04/29/2019   HGBA1C 7.9 (H) 11/26/2018   Lab Results  Component Value Date   MICROALBUR 37.5 (H) 11/26/2018   LDLCALC 95 04/29/2019   CREATININE 1.71 (H) 07/01/2019    Self-care: The diet that the patient has been following is: tries to limit fats usually.      Meals: 2-3 meals per day. Breakfast is cereal or scrambled eggs/sausage.   Lunch is half sandwich, yogurt and fruit; dinner usually baked chicken, bread and vegetables, snacks are fruits or nuts. Not eating out regularly   Dietician visit: Most recent: 06/2015 CDE visit: 12/15              Weight history: Previous range 200-260  Wt Readings from Last 3 Encounters:  07/03/19 246 lb 3.2 oz (111.7 kg)  05/01/19 240 lb 3.2 oz (109 kg)   01/08/19 223 lb 3.2 oz (101.2 kg)    Other active problems: discussed in review of systems   LABS:  Lab on 07/01/2019  Component Date Value Ref Range Status  . Sodium 07/01/2019 136  135 - 145 mEq/L Final  . Potassium 07/01/2019 4.2  3.5 - 5.1 mEq/L Final  . Chloride 07/01/2019 103  96 - 112 mEq/L Final  . CO2 07/01/2019 26  19 - 32 mEq/L Final  . Glucose, Bld 07/01/2019 192* 70 - 99 mg/dL Final  . BUN 07/01/2019 39* 6 - 23 mg/dL Final  . Creatinine, Ser 07/01/2019 1.71* 0.40 - 1.20 mg/dL Final  . Total Bilirubin 07/01/2019 0.5  0.2 - 1.2 mg/dL Final  . Alkaline Phosphatase 07/01/2019 57  39 - 117 U/L Final  . AST 07/01/2019 16  0 - 37 U/L Final  . ALT 07/01/2019 18  0 - 35 U/L Final  . Total Protein 07/01/2019 7.5  6.0 - 8.3 g/dL Final  . Albumin 07/01/2019 4.1  3.5 - 5.2 g/dL Final  . GFR 07/01/2019 36.17* >60.00 mL/min Final  . Calcium 07/01/2019 9.7  8.4 - 10.5 mg/dL Final  . Hgb A1c MFr Bld 07/01/2019 7.7* 4.6 - 6.5 % Final   Glycemic Control Guidelines for People with Diabetes:Non Diabetic:  <6%Goal of Therapy: <7%Additional Action Suggested:  >8%      Allergies as of 07/03/2019      Reactions   Avapro [irbesartan] Other (See Comments)   headaches   Codeine Nausea And Vomiting   Lisinopril Itching      Medication List       Accurate as of July 03, 2019  9:00 PM. If you have any questions, ask your nurse or doctor.        Accu-Chek FastClix Lancets Misc Use accu chek fastclix lancets to check blood sugar 2-3 times daily. DX:E11.65   Accu-Chek Guide Me w/Device Kit 1 each by Does not apply route 3 (three) times daily. Use accu chek guide me to check blood sugar 2-3 times daily. DX:E11.65   allopurinol 100 MG tablet Commonly known as: ZYLOPRIM Take 1 tablet (100 mg total) by mouth daily. For high uric acid   furosemide 40 MG tablet Commonly known as: LASIX Take 1 tablet by mouth once daily   hydrocortisone valerate cream 0.2 % Commonly known as:  WESTCORT   labetalol 100 MG tablet Commonly known as: NORMODYNE Take 1 tablet by mouth twice daily   metFORMIN 1000 MG tablet Commonly known as: GLUCOPHAGE TAKE 1 TABLET BY MOUTH TWICE DAILY WITH A MEAL   NovoLOG Mix 70/30 FlexPen (70-30) 100 UNIT/ML FlexPen Generic drug: insulin aspart  protamine - aspart Inject into the skin 2 (two) times daily. Inject 14 units under the skin in the morning and 40 units in the evening.   OneTouch Ultra test strip Generic drug: glucose blood USE 1 STRIP TO CHECK GLUCOSE THREE TIMES DAILY   potassium chloride 10 MEQ tablet Commonly known as: KLOR-CON Take 1 tablet by mouth once daily   ReliOn Pen Needles 31G X 6 MM Misc Generic drug: Insulin Pen Needle USE PEN NEEDLES TO INJECT INSULIN TWICE DAILY   rosuvastatin 20 MG tablet Commonly known as: Crestor Take 1 tablet (20 mg total) by mouth daily.   Tyler Aas FlexTouch 100 UNIT/ML FlexTouch Pen Generic drug: insulin degludec Inject 24 units under the skin once daily.   valsartan 160 MG tablet Commonly known as: DIOVAN Take 1 tablet by mouth once daily       Allergies:  Allergies  Allergen Reactions  . Avapro [Irbesartan] Other (See Comments)    headaches  . Codeine Nausea And Vomiting  . Lisinopril Itching    Past Medical History:  Diagnosis Date  . Abnormal Pap smear   . Asthma   . Diabetes mellitus   . Hyperlipidemia   . Hypertension   . LGSIL (low grade squamous intraepithelial dysplasia)     Past Surgical History:  Procedure Laterality Date  . ABDOMINAL HYSTERECTOMY    . bladder surgery    . COLPOSCOPY      Family History  Problem Relation Age of Onset  . Diabetes Mother   . Diabetes Maternal Grandmother   . Heart disease Neg Hx   . Hypertension Neg Hx     Social History:  reports that she has never smoked. She has never used smokeless tobacco. She reports that she does not drink alcohol or use drugs.    Review of Systems    HYPERURICEMIA:  Uric acid  has been significantly high in the past and she was given allopurinol since she was likely having gout previously Uric acid is improved and she is continuing allopurinol 100 mg  Lab Results  Component Value Date   LABURIC 7.2 (H) 11/26/2018     Hypertension:   She is taking valsartan 160 mg, half tablet On the last visit labetalol was restarted at 100 mg twice daily Not checking at home recently  Also followed by nephrologist Recently has not been watching her diet and has had some weight gain  BP Readings from Last 3 Encounters:  07/03/19 (!) 152/82  05/01/19 (!) 142/80  01/08/19 140/80   Renal function has been variable as below She does not take any ibuprofen or naproxen OTC  She has been seen by a nephrologist    On Lasix 40 for edema; also taking potassium supplements  Lab Results  Component Value Date   CREATININE 1.71 (H) 07/01/2019   CREATININE 2.01 (H) 04/29/2019   CREATININE 1.48 (H) 01/04/2019        Lipids:    LDL has been previously high, around 180 baseline She has been regular with her Crestor and her LDL is on the last visit finally below 100 Diet has been somewhat better overall over the last few months       Lab Results  Component Value Date   CHOL 163 04/29/2019   CHOL 170 01/04/2019   CHOL 192 11/26/2018   Lab Results  Component Value Date   HDL 46.70 04/29/2019   HDL 24.20 (L) 01/04/2019   HDL 37.10 (L) 11/26/2018   Lab Results  Component Value Date   LDLCALC 95 04/29/2019   LDLCALC 114 (H) 01/04/2019   LDLCALC 125 (H) 11/26/2018   Lab Results  Component Value Date   TRIG 106.0 04/29/2019   TRIG 160.0 (H) 01/04/2019   TRIG 147.0 11/26/2018   Lab Results  Component Value Date   CHOLHDL 3 04/29/2019   CHOLHDL 7 01/04/2019   CHOLHDL 5 11/26/2018   Lab Results  Component Value Date   LDLDIRECT 89.0 12/05/2016   LDLDIRECT 183.0 10/01/2015                 Diabetic foot exam last done in 09/4707 by PCP   Complications  from diabetes: Proliferative retinopathy, microalbuminuria  She refuses to take the flu vaccine but has taken the Covid vaccines    Physical Examination:  BP (!) 152/82 (BP Location: Left Arm, Patient Position: Sitting, Cuff Size: Normal)   Pulse 94   Ht '5\' 3"'$  (1.6 m)   Wt 246 lb 3.2 oz (111.7 kg)   SpO2 95%   BMI 43.61 kg/m        ASSESSMENT:  Diabetes type 2, with obesity, insulin requiring  See history of present illness for detailed discussion of his current management, blood sugar patterns and problems identified  Her A1c is further improved at 7.3  She checks blood sugars only before meals but does have a couple of high readings when she goes off her diet, highest 267 Her weight has gone back up and she thinks that she is not as consistent on her diet as before Also not doing any exercise  Hypertension: Blood pressure is again relatively high and this may be partly related to her weight gain and eating out frequently Needs to check blood pressure at home  RENAL dysfunction: Etiology is unclear and she is not taking any nonsteroidal anti-inflammatory drugs Her dose of valsartan has not been changed for some time  Edema: This is again present and likely from not being consistently restricting sodium, also likely has venous insufficiency    PLAN for diabetes:   Trial of Rybelsus, she is reluctant to take injectable drugs as she thinks she had tried Trulicity but not clear if she continued this partly because of cost  Discussed with the patient the nature of GLP-1 drugs, the actions on insulin secretion, slowing stomach emptying, reduction of appetite and reduced liver glucose production Explained that Rybelsus improves blood sugar control as well as produces weight loss and reduces cardiovascular events. Explained possible side effects especially nausea and vomiting that may occur in the first few days; usually side effects improve with time.   Patient to call if  nausea or vomiting does not improve within 2 weeks Instructed to take the capsules on empty stomach 30 minutes before breakfast with 4 ounces of water daily.   Patient education material given  Emphasized the need to check blood sugars at various times of the day especially after meals She will make sure she takes her insulin with her when she goes out to eat Start walking daily  HYPERTENSION with mild microalbuminuria: Blood pressure relatively high but may be partly related to her weight gain and poor diet recently Will monitor again on the next visit   Patient Instructions  Check blood sugars on waking up 3 days a week  Also check blood sugars about 2 hours after meals and do this after different meals by rotation  Recommended blood sugar levels on waking up are 90-130 and about 2 hours  after meal is 130-160  Please bring your blood sugar monitor to each visit, thank you  Walk daily        Elayne Snare 07/03/2019, 9:00 PM   Note: This office note was prepared with Dragon voice recognition system technology. Any transcriptional errors that result from this process are unintentional.

## 2019-07-03 NOTE — Patient Instructions (Addendum)
Check blood sugars on waking up 3 days a week  Also check blood sugars about 2 hours after meals and do this after different meals by rotation  Recommended blood sugar levels on waking up are 90-130 and about 2 hours after meal is 130-160  Please bring your blood sugar monitor to each visit, thank you  Walk daily 

## 2019-07-03 NOTE — Progress Notes (Signed)
Per instructions of Dr. Dwyane Dee, pt was given a sample of Rybelsus 3mg  tablets. [id number] EX: 07/03/2019

## 2019-07-10 DIAGNOSIS — K439 Ventral hernia without obstruction or gangrene: Secondary | ICD-10-CM | POA: Diagnosis not present

## 2019-07-31 ENCOUNTER — Ambulatory Visit: Payer: Medicare HMO | Admitting: Endocrinology

## 2019-08-07 ENCOUNTER — Other Ambulatory Visit: Payer: Self-pay | Admitting: Endocrinology

## 2019-08-08 ENCOUNTER — Other Ambulatory Visit: Payer: Self-pay | Admitting: Endocrinology

## 2019-08-16 ENCOUNTER — Telehealth: Payer: Self-pay | Admitting: Endocrinology

## 2019-08-16 NOTE — Telephone Encounter (Signed)
Patient called stating she thinks she is having an allergic reaction to one of her medications - says she has developed large mosquito bite looking bumps. Please advise. Ph# [phone]

## 2019-08-16 NOTE — Telephone Encounter (Signed)
Spoke with patient regarding possible allergic reaction to Labetalol 100 MG. Patient has stopped taking medication. Patient denies having difficulty breathing or swallowing. Advised patient to go to Urgent Care if symptoms worsen.Patient verbalized understanding.

## 2019-08-16 NOTE — Telephone Encounter (Signed)
Routing as high priority.

## 2019-08-17 NOTE — Telephone Encounter (Signed)
Very likely she is having an allergic reaction but not from her blood pressure medicines since she has taken them for a while.  She needs to see her PCP

## 2019-08-19 DIAGNOSIS — I2510 Atherosclerotic heart disease of native coronary artery without angina pectoris: Secondary | ICD-10-CM | POA: Diagnosis not present

## 2019-08-19 DIAGNOSIS — E7800 Pure hypercholesterolemia, unspecified: Secondary | ICD-10-CM | POA: Diagnosis not present

## 2019-08-19 DIAGNOSIS — J45909 Unspecified asthma, uncomplicated: Secondary | ICD-10-CM | POA: Diagnosis not present

## 2019-08-19 DIAGNOSIS — E11311 Type 2 diabetes mellitus with unspecified diabetic retinopathy with macular edema: Secondary | ICD-10-CM | POA: Diagnosis not present

## 2019-08-19 DIAGNOSIS — E118 Type 2 diabetes mellitus with unspecified complications: Secondary | ICD-10-CM | POA: Diagnosis not present

## 2019-08-19 DIAGNOSIS — E119 Type 2 diabetes mellitus without complications: Secondary | ICD-10-CM | POA: Diagnosis not present

## 2019-08-19 DIAGNOSIS — I10 Essential (primary) hypertension: Secondary | ICD-10-CM | POA: Diagnosis not present

## 2019-08-19 DIAGNOSIS — E1165 Type 2 diabetes mellitus with hyperglycemia: Secondary | ICD-10-CM | POA: Diagnosis not present

## 2019-08-19 NOTE — Telephone Encounter (Signed)
Called patient to advise her to contact PCP. No answer / no voicemail available.

## 2019-08-20 NOTE — Telephone Encounter (Signed)
Called again. Left message for patient to call.

## 2019-08-21 ENCOUNTER — Telehealth: Payer: Self-pay | Admitting: *Deleted

## 2019-08-21 ENCOUNTER — Other Ambulatory Visit: Payer: Self-pay

## 2019-08-21 ENCOUNTER — Ambulatory Visit (INDEPENDENT_AMBULATORY_CARE_PROVIDER_SITE_OTHER): Payer: Medicare HMO | Admitting: Endocrinology

## 2019-08-21 ENCOUNTER — Encounter: Payer: Self-pay | Admitting: Endocrinology

## 2019-08-21 VITALS — BP 152/80 | HR 96 | Ht 63.0 in | Wt 243.0 lb

## 2019-08-21 DIAGNOSIS — E1165 Type 2 diabetes mellitus with hyperglycemia: Secondary | ICD-10-CM | POA: Diagnosis not present

## 2019-08-21 DIAGNOSIS — I10 Essential (primary) hypertension: Secondary | ICD-10-CM | POA: Diagnosis not present

## 2019-08-21 DIAGNOSIS — E1121 Type 2 diabetes mellitus with diabetic nephropathy: Secondary | ICD-10-CM | POA: Diagnosis not present

## 2019-08-21 DIAGNOSIS — Z794 Long term (current) use of insulin: Secondary | ICD-10-CM

## 2019-08-21 NOTE — Progress Notes (Signed)
Patient ID: Stephanie [name], female   DOB: 1955/11/22, 64 y.o.   MRN: [id number]           Reason for Appointment: Followup of diabetes and hypertension  Referring physician: Hulan Fess  History of Present Illness:          Diagnosis: Type 2 diabetes mellitus, date of diagnosis: 2003       Past history:  She was initially treated metformin at some point glimepiride also added and not clear what her level of control was in the first few years. Had been only taking 500 mg twice a day of metformin and Amaryl increased to 4 mg twice a day several years ago In the last few years her blood sugar control has been more difficult and she has been managed by an endocrinologist Because of poor control she had been tried on Janumet and [place] but she states that she could not tolerate them because of headaches Had poor control with Amaryl and metformin and also difficulty with weight gain she had started Trulicity in 57/3220 on her initial consultation Her weight on her initial visit was 240 pounds. Her A1c had gone down to 7.1% in 25/42 with using Trulicity which she was tolerating at that time However in 2/16 she stopped taking Trulicity as it was causing nausea , subsequently did not want to go on [place] Because of markedly increased sugars she has been on premixed insulin since 4/16  Recent history:   INSULIN DOSE: NovoLog mix 70/30, 14 before breakfast and 40 units before supper  Tresiba 24 units daily  Oral hypoglycemic drugs the patient is taking are: Metformin 1 g once a day   A1c is 7.7 recently compared to 7.3    Current diabetes history, management, problems and blood sugar patterns:   She says she tried the Rybelsus samples she was given but since she had vomiting after the second dose she stopped it without letting us know  Although she thinks that she can try to lose weight on her own by cutting back on portions she has lost only 3 pounds  No blood sugars readings being  done after dinner and difficult to assess patterns since she mostly checks before her first meal which could be midday  Average blood sugar is somewhat better at home compared to last time  She has not done any walking  However has not been eating out as much as last time and weight is slightly better  She takes her Metformin in the morning, dose has been reduced because of renal insufficiency      Side effects from medications have been: Januvia, Onglyza apparently caused headaches, Invokana: Candidiasis   Compliance with the medical regimen: Fair    Dinner is usually at 7 pm  Glucose monitoring:  done 1 times a day or more       Glucometer: One Touch Ultra.      Blood Glucose readings by download with average readings:   PRE-MEAL Fasting Lunch Dinner Bedtime Overall  Glucose range:  126-241   163-229    Mean/median:  190   185   188   POST-MEAL PC Breakfast PC Lunch PC Dinner  Glucose range:  138-226    Mean/median:      Previous readings:  PRE-MEAL Fasting Lunch Dinner Bedtime Overall  Glucose range:  134-323   138-327  114   Mean/median:  200    200   Previous readings:  PRE-MEAL Fasting Lunch Dinner Bedtime Overall  Glucose range:  103-180   114-154  150-267   Mean/median: 133    143    Glycemic control:   Lab Results  Component Value Date   HGBA1C 7.7 (H) 07/01/2019   HGBA1C 7.3 (H) 04/29/2019   HGBA1C 7.9 (H) 11/26/2018   Lab Results  Component Value Date   MICROALBUR 37.5 (H) 11/26/2018   LDLCALC 95 04/29/2019   CREATININE 1.71 (H) 07/01/2019    Self-care: The diet that the patient has been following is: tries to limit fats usually.      Meals: 2-3 meals per day. Breakfast is cereal or scrambled eggs/sausage.   Lunch is half sandwich, yogurt and fruit; dinner usually baked chicken, bread and vegetables, snacks are fruits or nuts. Not eating out regularly   Dietician visit: Most recent: 06/2015 CDE visit: 12/15              Weight history:  Previous range 200-260  Wt Readings from Last 3 Encounters:  08/21/19 (!) 243 lb (110.2 kg)  07/03/19 246 lb 3.2 oz (111.7 kg)  05/01/19 240 lb 3.2 oz (109 kg)    Other active problems: discussed in review of systems   LABS:  No visits with results within 1 Week(s) from this visit.  Latest known visit with results is:  Lab on 07/01/2019  Component Date Value Ref Range Status  . Sodium 07/01/2019 136  135 - 145 mEq/L Final  . Potassium 07/01/2019 4.2  3.5 - 5.1 mEq/L Final  . Chloride 07/01/2019 103  96 - 112 mEq/L Final  . CO2 07/01/2019 26  19 - 32 mEq/L Final  . Glucose, Bld 07/01/2019 192* 70 - 99 mg/dL Final  . BUN 07/01/2019 39* 6 - 23 mg/dL Final  . Creatinine, Ser 07/01/2019 1.71* 0.40 - 1.20 mg/dL Final  . Total Bilirubin 07/01/2019 0.5  0.2 - 1.2 mg/dL Final  . Alkaline Phosphatase 07/01/2019 57  39 - 117 U/L Final  . AST 07/01/2019 16  0 - 37 U/L Final  . ALT 07/01/2019 18  0 - 35 U/L Final  . Total Protein 07/01/2019 7.5  6.0 - 8.3 g/dL Final  . Albumin 07/01/2019 4.1  3.5 - 5.2 g/dL Final  . GFR 07/01/2019 36.17* >60.00 mL/min Final  . Calcium 07/01/2019 9.7  8.4 - 10.5 mg/dL Final  . Hgb A1c MFr Bld 07/01/2019 7.7* 4.6 - 6.5 % Final   Glycemic Control Guidelines for People with Diabetes:Non Diabetic:  <6%Goal of Therapy: <7%Additional Action Suggested:  >8%      Allergies as of 08/21/2019      Reactions   Avapro [irbesartan] Other (See Comments)   headaches   Codeine Nausea And Vomiting   Lisinopril Itching      Medication List       Accurate as of August 21, 2019 10:47 AM. If you have any questions, ask your nurse or doctor.        Accu-Chek FastClix Lancets Misc Use accu chek fastclix lancets to check blood sugar 2-3 times daily. DX:E11.65   Accu-Chek Guide Me w/Device Kit 1 each by Does not apply route 3 (three) times daily. Use accu chek guide me to check blood sugar 2-3 times daily. DX:E11.65   allopurinol 100 MG tablet Commonly known as:  ZYLOPRIM Take 1 tablet (100 mg total) by mouth daily. For high uric acid What changed: additional instructions   furosemide 40 MG tablet Commonly known as: LASIX Take 1 tablet by mouth once daily   hydrocortisone valerate  cream 0.2 % Commonly known as: WESTCORT   labetalol 100 MG tablet Commonly known as: NORMODYNE Take 1 tablet by mouth twice daily   metFORMIN 1000 MG tablet Commonly known as: GLUCOPHAGE TAKE 1 TABLET BY MOUTH TWICE DAILY WITH A MEAL   NovoLOG Mix 70/30 FlexPen (70-30) 100 UNIT/ML FlexPen Generic drug: insulin aspart protamine - aspart Inject into the skin 2 (two) times daily. Inject 14 units under the skin in the morning and 40 units in the evening.   OneTouch Ultra test strip Generic drug: glucose blood USE 1 STRIP TO CHECK GLUCOSE THREE TIMES DAILY   potassium chloride 10 MEQ tablet Commonly known as: KLOR-CON Take 1 tablet by mouth once daily   ReliOn Pen Needles 31G X 6 MM Misc Generic drug: Insulin Pen Needle USE PEN NEEDLES TO INJECT INSULIN TWICE DAILY   rosuvastatin 20 MG tablet Commonly known as: Crestor Take 1 tablet (20 mg total) by mouth daily.   Tyler Aas FlexTouch 100 UNIT/ML FlexTouch Pen Generic drug: insulin degludec Inject 24 units under the skin once daily.   valsartan 160 MG tablet Commonly known as: DIOVAN Take 1 tablet by mouth once daily       Allergies:  Allergies  Allergen Reactions  . Avapro [Irbesartan] Other (See Comments)    headaches  . Codeine Nausea And Vomiting  . Lisinopril Itching    Past Medical History:  Diagnosis Date  . Abnormal Pap smear   . Asthma   . Diabetes mellitus   . Hyperlipidemia   . Hypertension   . LGSIL (low grade squamous intraepithelial dysplasia)     Past Surgical History:  Procedure Laterality Date  . ABDOMINAL HYSTERECTOMY    . bladder surgery    . COLPOSCOPY      Family History  Problem Relation Age of Onset  . Diabetes Mother   . Diabetes Maternal Grandmother   .  Heart disease Neg Hx   . Hypertension Neg Hx     Social History:  reports that she has never smoked. She has never used smokeless tobacco. She reports that she does not drink alcohol and does not use drugs.    Review of Systems    HYPERURICEMIA:  Uric acid has been significantly high in the past and she was given allopurinol since she was likely having gout previously She was told to take allopurinol 200 mg by the nephrologist  Lab Results  Component Value Date   LABURIC 7.2 (H) 11/26/2018     Hypertension:   She is taking valsartan 160 mg, half tablet She has been on labetalol except has not taken any in the last week Not checking at home consistently  Also followed by nephrologist  BP Readings from Last 3 Encounters:  08/21/19 (!) 152/80  07/03/19 (!) 152/82  05/01/19 (!) 142/80   Renal function has been variable as below   She has been seen by a nephrologist    On Lasix 40 for edema; also taking potassium supplements  Lab Results  Component Value Date   CREATININE 1.71 (H) 07/01/2019   CREATININE 2.01 (H) 04/29/2019   CREATININE 1.48 (H) 01/04/2019        Lipids:    LDL has been previously high, around 180 baseline She has been regular with her Crestor and her LDL is on the last visit below 100       Lab Results  Component Value Date   CHOL 163 04/29/2019   CHOL 170 01/04/2019   CHOL 192 11/26/2018  Lab Results  Component Value Date   HDL 46.70 04/29/2019   HDL 24.20 (L) 01/04/2019   HDL 37.10 (L) 11/26/2018   Lab Results  Component Value Date   LDLCALC 95 04/29/2019   LDLCALC 114 (H) 01/04/2019   LDLCALC 125 (H) 11/26/2018   Lab Results  Component Value Date   TRIG 106.0 04/29/2019   TRIG 160.0 (H) 01/04/2019   TRIG 147.0 11/26/2018   Lab Results  Component Value Date   CHOLHDL 3 04/29/2019   CHOLHDL 7 01/04/2019   CHOLHDL 5 11/26/2018   Lab Results  Component Value Date   LDLDIRECT 89.0 12/05/2016   LDLDIRECT 183.0  10/01/2015                 Diabetic foot exam last done in 01/9620 by PCP   Complications from diabetes: Proliferative retinopathy, microalbuminuria  Is asking about skin rash with itching for the last week on her arms mostly  Physical Examination:  BP (!) 152/80 (BP Location: Left Arm, Patient Position: Sitting, Cuff Size: Large)   Pulse 96   Ht '5\' 3"'$  (1.6 m)   Wt (!) 243 lb (110.2 kg)   SpO2 98%   BMI 43.05 kg/m     No significant ankle edema present  Maculopapular slightly reddish rash on upper arms mostly, also back of neck  ASSESSMENT:  Diabetes type 2, with obesity, insulin requiring  See history of present illness for detailed discussion of his current management, blood sugar patterns and problems identified  Her A1c is last 7.7  Blood sugars are averaging nearly 200 Generally higher in the morning She can still do better with monitoring, diet, exercise She may have had nausea with Rybelsus but she tried only 2 tablets and she still has a sample at home  Hypertension: Blood pressure is again relatively high Has not taken labetalol since last week  RENAL dysfunction: Continue follow-up with nephrologist  Skin rash: Not clear this is related to medications as it is mostly on the upper arms Also she has been on labetalol for at least a couple of months ago it is unlikely this is causing it, potentially can be from haloperidol   PLAN for diabetes:   Retry Rybelsus, she will eat breakfast 30 minutes after taking the medication consistently She will call if she has any side effects No change in insulin at this time Consider 7 mg on the next visit Emphasized the need to check blood sugars after dinner Avoid high-fat meals and a lot of carbohydrate snacks Start walking daily Change Metformin to dinnertime  HYPERTENSION with mild microalbuminuria: Blood pressure again high and she needs to start back on labetalol  Skin rash: She will see her PCP for  this  There are no Patient Instructions on file for this visit.     Elayne Snare 08/21/2019, 10:47 AM   Note: This office note was prepared with Dragon voice recognition system technology. Any transcriptional errors that result from this process are unintentional.

## 2019-08-21 NOTE — Patient Instructions (Addendum)
Stop Allourinol  Retry Rybelsus  Check blood sugars on waking up 2-3 days a week  Also check blood sugars about 2 hours after meals and do this after different meals by rotation  Recommended blood sugar levels on waking up are 90-130 and about 2 hours after meal is 130-180  Please bring your blood sugar monitor to each visit, thank you  Metformin at supper only

## 2019-08-21 NOTE — Telephone Encounter (Signed)
She needs to continue labetalol but also discuss with Dr. Rex Kras

## 2019-08-21 NOTE — Telephone Encounter (Signed)
Patient asking if she should continue to take Labetalol 100 MG , one tablet BID.

## 2019-08-21 NOTE — Telephone Encounter (Signed)
Called patient with MD instructions. No answer. Mailbox is full.

## 2019-08-22 NOTE — Telephone Encounter (Signed)
Pt came into the office for visit with MD. This was discussed during office visit.

## 2019-09-03 ENCOUNTER — Other Ambulatory Visit: Payer: Self-pay | Admitting: Endocrinology

## 2019-09-06 ENCOUNTER — Other Ambulatory Visit: Payer: Self-pay | Admitting: Endocrinology

## 2019-09-09 ENCOUNTER — Other Ambulatory Visit: Payer: Self-pay | Admitting: *Deleted

## 2019-09-09 MED ORDER — RELION PEN NEEDLES 31G X 6 MM MISC: 0 refills | Status: DC

## 2019-09-18 ENCOUNTER — Emergency Department (HOSPITAL_COMMUNITY)
Admission: EM | Admit: 2019-09-18 | Discharge: 2019-09-18 | Disposition: A | Discharge status: Home / Self Care | Payer: Medicare HMO | Attending: Emergency Medicine | Admitting: Emergency Medicine

## 2019-09-18 ENCOUNTER — Encounter (HOSPITAL_COMMUNITY): Payer: Self-pay | Admitting: Emergency Medicine

## 2019-09-18 ENCOUNTER — Emergency Department (HOSPITAL_BASED_OUTPATIENT_CLINIC_OR_DEPARTMENT_OTHER): Payer: Medicare HMO

## 2019-09-18 ENCOUNTER — Ambulatory Visit: Payer: Medicare HMO | Admitting: Endocrinology

## 2019-09-18 DIAGNOSIS — E1165 Type 2 diabetes mellitus with hyperglycemia: Secondary | ICD-10-CM | POA: Insufficient documentation

## 2019-09-18 DIAGNOSIS — K439 Ventral hernia without obstruction or gangrene: Secondary | ICD-10-CM

## 2019-09-18 DIAGNOSIS — Z794 Long term (current) use of insulin: Secondary | ICD-10-CM | POA: Insufficient documentation

## 2019-09-18 DIAGNOSIS — R109 Unspecified abdominal pain: Secondary | ICD-10-CM | POA: Diagnosis present

## 2019-09-18 DIAGNOSIS — I10 Essential (primary) hypertension: Secondary | ICD-10-CM | POA: Insufficient documentation

## 2019-09-18 DIAGNOSIS — Z79899 Other long term (current) drug therapy: Secondary | ICD-10-CM | POA: Insufficient documentation

## 2019-09-18 DIAGNOSIS — M79674 Pain in right toe(s): Secondary | ICD-10-CM | POA: Diagnosis not present

## 2019-09-18 DIAGNOSIS — E1129 Type 2 diabetes mellitus with other diabetic kidney complication: Secondary | ICD-10-CM | POA: Insufficient documentation

## 2019-09-18 DIAGNOSIS — J45909 Unspecified asthma, uncomplicated: Secondary | ICD-10-CM | POA: Diagnosis not present

## 2019-09-18 DIAGNOSIS — R609 Edema, unspecified: Secondary | ICD-10-CM | POA: Diagnosis not present

## 2019-09-18 DIAGNOSIS — M79661 Pain in right lower leg: Secondary | ICD-10-CM | POA: Insufficient documentation

## 2019-09-18 DIAGNOSIS — E11319 Type 2 diabetes mellitus with unspecified diabetic retinopathy without macular edema: Secondary | ICD-10-CM | POA: Diagnosis not present

## 2019-09-18 DIAGNOSIS — M79662 Pain in left lower leg: Secondary | ICD-10-CM | POA: Diagnosis not present

## 2019-09-18 LAB — COMPREHENSIVE METABOLIC PANEL
ALT: 25 U/L (ref 0–44)
AST: 23 U/L (ref 15–41)
Albumin: 3.7 g/dL (ref 3.5–5.0)
Alkaline Phosphatase: 49 U/L (ref 38–126)
Anion gap: 11 (ref 5–15)
BUN: 30 mg/dL — ABNORMAL HIGH (ref 8–23)
CO2: 29 mmol/L (ref 22–32)
Calcium: 9.7 mg/dL (ref 8.9–10.3)
Chloride: 100 mmol/L (ref 98–111)
Creatinine, Ser: 1.71 mg/dL — ABNORMAL HIGH (ref 0.44–1.00)
GFR calc Af Amer: 36 mL/min — ABNORMAL LOW (ref >60)
GFR calc non Af Amer: 31 mL/min — ABNORMAL LOW (ref >60)
Glucose, Bld: 154 mg/dL — ABNORMAL HIGH (ref 70–99)
Potassium: 3.9 mmol/L (ref 3.5–5.1)
Sodium: 140 mmol/L (ref 135–145)
Total Bilirubin: 0.8 mg/dL (ref 0.3–1.2)
Total Protein: 7.4 g/dL (ref 6.5–8.1)

## 2019-09-18 LAB — CBC WITH DIFFERENTIAL/PLATELET
Abs Immature Granulocytes: 0.03 10*3/uL (ref 0.00–0.07)
Basophils Absolute: 0 10*3/uL (ref 0.0–0.1)
Basophils Relative: 0 %
Eosinophils Absolute: 0.2 10*3/uL (ref 0.0–0.5)
Eosinophils Relative: 3 %
HCT: 34.9 % — ABNORMAL LOW (ref 36.0–46.0)
Hemoglobin: 10.8 g/dL — ABNORMAL LOW (ref 12.0–15.0)
Immature Granulocytes: 0 %
Lymphocytes Relative: 42 %
Lymphs Abs: 3.2 10*3/uL (ref 0.7–4.0)
MCH: 29.6 pg (ref 26.0–34.0)
MCHC: 30.9 g/dL (ref 30.0–36.0)
MCV: 95.6 fL (ref 80.0–100.0)
Monocytes Absolute: 0.6 10*3/uL (ref 0.1–1.0)
Monocytes Relative: 8 %
Neutro Abs: 3.6 10*3/uL (ref 1.7–7.7)
Neutrophils Relative %: 47 %
Platelets: 229 10*3/uL (ref 150–400)
RBC: 3.65 MIL/uL — ABNORMAL LOW (ref 3.87–5.11)
RDW: 13.3 % (ref 11.5–15.5)
WBC: 7.7 10*3/uL (ref 4.0–10.5)
nRBC: 0 % (ref 0.0–0.2)

## 2019-09-18 LAB — LACTIC ACID, PLASMA: Lactic Acid, Venous: 1.3 mmol/L (ref 0.5–1.9)

## 2019-09-18 MED ORDER — DICLOFENAC SODIUM 1 % EX GEL: 2.0000 g | Freq: Four times a day (QID) | CUTANEOUS | 0 refills | Status: DC

## 2019-09-18 MED ORDER — NAPROXEN 500 MG PO TABS: 500.0000 mg | ORAL_TABLET | Freq: Two times a day (BID) | ORAL | 0 refills | Status: AC

## 2019-09-18 MED ORDER — ACETAMINOPHEN 325 MG PO TABS
650.0000 mg | ORAL_TABLET | Freq: Once | ORAL | Status: AC
  Administered 2019-09-18: 650 mg via ORAL
  Filled 2019-09-18: qty 2

## 2019-09-18 NOTE — ED Triage Notes (Signed)
Pt reports some pain around her abdominal hernia for the past few days, she also endorses some R shin pain, states she bumped her leg on the bath tub a few days ago but denies swelling or bruising.

## 2019-09-18 NOTE — ED Notes (Signed)
Patient transported to Ultrasound 

## 2019-09-18 NOTE — Progress Notes (Signed)
Lower extremity venous RT study completed.   Please see CV Proc for preliminary results.   Darlin Coco

## 2019-09-18 NOTE — ED Provider Notes (Signed)
[facility] DEPARTMENT Provider Note   CSN: [id number] Arrival date & time: 09/18/19  1015     History Chief Complaint  Patient presents with  . Hernia  . Leg Pain    Stephanie [name] is a 64 y.o. female presenting for evaluation of abd pain, R leg pain, and R toe pain.   Pt states for the past few days she has had pain around her hernia.  She reports the hernia has been there for many years, but is normally not painful.  She denies associated fevers, chills, nausea, vomiting, abnormal bowel movements.  Nothing makes the pain better or worse.  She has not taken anything including Tylenol or ibuprofen.  She has been seen by Dr. Dema Severin with general surgery about this hernia in the past, been told she needs to lose weight prior to having surgery. Additionally, patient reporting pain in her right calf.  It was severe last night.  She has no pain currently.  Additionally, she reports her right great toe was very painful last night, to the point where she cannot have a cover on it.  She denies trauma or injury.  She denies numbness or tingling.  She denies pain in the left leg.  HPI     Past Medical History:  Diagnosis Date  . Abnormal Pap smear   . Asthma   . Diabetes mellitus   . Hyperlipidemia   . Hypertension   . LGSIL (low grade squamous intraepithelial dysplasia)     Patient Active Problem List   Diagnosis Date Noted  . Uncontrolled type 2 diabetes mellitus with hyperglycemia, with long-term current use of insulin ([place]) 12/07/2016  . Diabetes type 2, uncontrolled ([place]) 10/28/2013  . 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 Type II diabetes mellitus with renal manifestations ([place]) 09/13/2013  . Essential hypertension, benign 09/13/2013  . Hyperlipidemia associated with type 2 diabetes mellitus ([place]) 09/13/2013  . LGSIL (low grade squamous intraepithelial dysplasia)     Past Surgical History:  Procedure Laterality Date  . ABDOMINAL HYSTERECTOMY    .  bladder surgery    . COLPOSCOPY       OB History    Gravida  2   Para  2   Term      Preterm      AB      Living  2     SAB      TAB      Ectopic      Multiple      Live Births              Family History  Problem Relation Age of Onset  . Diabetes Mother   . Diabetes Maternal Grandmother   . Heart disease Neg Hx   . Hypertension Neg Hx     Social History   Tobacco Use  . Smoking status: Never Smoker  . Smokeless tobacco: Never Used  Substance Use Topics  . Alcohol use: No  . Drug use: No    Home Medications Prior to Admission medications   Medication Sig Start Date End Date Taking? Authorizing Provider  Accu-Chek FastClix Lancets MISC Use accu chek fastclix lancets to check blood sugar 2-3 times daily. DX:E11.65 01/30/19   Elayne Snare, MD  allopurinol (ZYLOPRIM) 100 MG tablet Take 1 tablet (100 mg total) by mouth daily. For high uric acid Patient taking differently: Take 100 mg by mouth daily. For high uric acid - patient taking 2 tablets daily 08/24/18  Elayne Snare, MD  Blood Glucose Monitoring Suppl (ACCU-CHEK GUIDE ME) w/Device KIT 1 each by Does not apply route 3 (three) times daily. Use accu chek guide me to check blood sugar 2-3 times daily. DX:E11.65 01/30/19   Elayne Snare, MD  diclofenac Sodium (VOLTAREN) 1 % GEL Apply 2 g topically 4 (four) times daily. 09/18/19   Nora Rooke, PA-C  furosemide (LASIX) 40 MG tablet Take 1 tablet by mouth once daily 08/09/19   Elayne Snare, MD  hydrocortisone valerate cream (WESTCORT) 0.2 %  06/28/13   [provider]  insulin aspart protamine - aspart (NOVOLOG MIX 70/30 FLEXPEN) (70-30) 100 UNIT/ML FlexPen Inject into the skin 2 (two) times daily. Inject 14 units under the skin in the morning and 40 units in the evening.    [provider]  insulin degludec (TRESIBA FLEXTOUCH) 100 UNIT/ML FlexTouch Pen Inject 24 units under the skin once daily. 04/16/19   Elayne Snare, MD  Insulin Pen Needle (RELION  PEN NEEDLES) 31G X 6 MM MISC USE PEN NEEDLES TO INJECT INSULIN THREE TIMES DAILY 09/09/19   Elayne Snare, MD  labetalol (NORMODYNE) 100 MG tablet Take 1 tablet by mouth twice daily 06/23/19   Elayne Snare, MD  metFORMIN (GLUCOPHAGE) 1000 MG tablet TAKE 1 TABLET BY MOUTH TWICE DAILY WITH A MEAL Patient taking differently: Patient taking 1 tablet 500 mg once daily 05/03/19   Elayne Snare, MD  naproxen (NAPROSYN) 500 MG tablet Take 1 tablet (500 mg total) by mouth 2 (two) times daily with a meal for 7 days. 09/18/19 09/25/19  Cristen Bredeson, PA-C  ONETOUCH ULTRA test strip USE 1 STRIP TO CHECK GLUCOSE THREE TIMES DAILY 07/02/19   Elayne Snare, MD  potassium chloride (KLOR-CON) 10 MEQ tablet Take 1 tablet by mouth once daily 09/06/19   Elayne Snare, MD  rosuvastatin (CRESTOR) 20 MG tablet Take 1 tablet (20 mg total) by mouth daily. 05/06/19   Elayne Snare, MD  valsartan (DIOVAN) 160 MG tablet Take 1 tablet by mouth once daily 09/04/19   Elayne Snare, MD    Allergies    Avapro [irbesartan], Codeine, and Lisinopril  Review of Systems   Review of Systems  Gastrointestinal: Positive for abdominal pain.  Musculoskeletal: Positive for arthralgias and myalgias.  All other systems reviewed and are negative.   Physical Exam Updated Vital Signs BP (!) 156/78 (BP Location: Left Arm)   Pulse 76   Temp 98.7 F (37.1 C) (Oral)   Resp 18   Ht 5' 3.5" (1.613 m)   Wt 110.2 kg   SpO2 98%   BMI 42.37 kg/m   Physical Exam Vitals and nursing note reviewed.  Constitutional:      General: She is not in acute distress.    Appearance: She is well-developed.     Comments: Obese, resting comfortably in the bed  HENT:     Head: Normocephalic and atraumatic.  Eyes:     Extraocular Movements: Extraocular movements intact.     Conjunctiva/sclera: Conjunctivae normal.     Pupils: Pupils are equal, round, and reactive to light.  Cardiovascular:     Rate and Rhythm: Normal rate and regular rhythm.     Pulses: Normal  pulses.  Pulmonary:     Effort: Pulmonary effort is normal. No respiratory distress.     Breath sounds: Normal breath sounds. No wheezing.  Abdominal:     General: There is no distension.     Palpations: Abdomen is soft. There is no mass.  Tenderness: There is no abdominal tenderness. There is no guarding or rebound.     Hernia: A hernia is present.       Comments: Large vental hernia. Soft, not reducible (pt states never reduces). Pt reports mild pain with palpation. Overall abd is soft and nontender.   Musculoskeletal:        General: Tenderness present. Normal range of motion.     Cervical back: Normal range of motion and neck supple.     Right lower leg: Edema present.     Left lower leg: Edema present.     Comments: TT of R medial calf. Mild swelling. No ttp elsewhere in the lower leg. Pedal pulses 2+ bilaterally. 2+ pitting edema bilaterally (baseline per pt).  R great toe without erythema or swelling.   Skin:    General: Skin is warm and dry.     Capillary Refill: Capillary refill takes less than 2 seconds.  Neurological:     Mental Status: She is alert and oriented to person, place, and time.     ED Results / Procedures / Treatments   Labs (all labs ordered are listed, but only abnormal results are displayed) Labs Reviewed  CBC WITH DIFFERENTIAL/PLATELET - Abnormal; Notable for the following components:      Result Value   RBC 3.65 (*)    Hemoglobin 10.8 (*)    HCT 34.9 (*)    All other components within normal limits  COMPREHENSIVE METABOLIC PANEL - Abnormal; Notable for the following components:   Glucose, Bld 154 (*)    BUN 30 (*)    Creatinine, Ser 1.71 (*)    GFR calc non Af Amer 31 (*)    GFR calc Af Amer 36 (*)    All other components within normal limits  LACTIC ACID, PLASMA    EKG None  Radiology VAS [place] LOWER EXTREMITY VENOUS (DVT) (ONLY [facility] & [facility] 7a-7p)  Result Date: 09/18/2019  Lower Venous DVTStudy Indications: Edema.  Comparison Study: No  prior studies. Performing Technologist: Darlin Coco  Examination Guidelines: A complete evaluation includes B-mode imaging, spectral Doppler, color Doppler, and power Doppler as needed of all accessible portions of each vessel. Bilateral testing is considered an integral part of a complete examination. Limited examinations for reoccurring indications may be performed as noted. The reflux portion of the exam is performed with the patient in reverse Trendelenburg.  +---------+---------------+---------+-----------+----------+-------------------+ RIGHT    CompressibilityPhasicitySpontaneityPropertiesThrombus Aging      +---------+---------------+---------+-----------+----------+-------------------+ CFV      Full           Yes      Yes                                      +---------+---------------+---------+-----------+----------+-------------------+ SFJ      Full                                                             +---------+---------------+---------+-----------+----------+-------------------+ FV Prox  Full                                                             +---------+---------------+---------+-----------+----------+-------------------+  FV Mid   Full                                                             +---------+---------------+---------+-----------+----------+-------------------+ FV DistalFull                                                             +---------+---------------+---------+-----------+----------+-------------------+ PFV      Full                                                             +---------+---------------+---------+-----------+----------+-------------------+ POP      Full           Yes      Yes                                      +---------+---------------+---------+-----------+----------+-------------------+ PTV      Full                                                              +---------+---------------+---------+-----------+----------+-------------------+ PERO     Full                                         Some segments not                                                         visualized          +---------+---------------+---------+-----------+----------+-------------------+   +----+---------------+---------+-----------+----------+--------------+ LEFTCompressibilityPhasicitySpontaneityPropertiesThrombus Aging +----+---------------+---------+-----------+----------+--------------+ CFV Full           Yes      Yes                                 +----+---------------+---------+-----------+----------+--------------+     Summary: RIGHT: - There is no evidence of deep vein thrombosis in the lower extremity.  - No cystic structure found in the popliteal fossa.  LEFT: - No evidence of common femoral vein obstruction.  *See table(s) above for measurements and observations.    Preliminary     Procedures Procedures (including critical care time)  Medications Ordered in ED Medications  acetaminophen (TYLENOL) tablet 650 mg (650 mg Oral Given 09/18/19 1509)    ED Course  I have reviewed the triage vital signs and the nursing notes.  Pertinent  labs & imaging results that were available during my care of the patient were reviewed by me and considered in my medical decision making (see chart for details).    MDM Rules/Calculators/A&P                          Pt presenting for hernia pain, right leg pain, and right toe pain.  On exam, patient appears nontoxic.  Her abdominal exam is overall reassuring.  No other hernias present, and is soft.  Is not completely reducible, patient states it never is.  Clinically, does not appear consistent with incarceration or obstruction.  She is not having any fevers, and nausea, vomiting, or difficulty with bowel movements.  Will obtain labs, but in the setting of a normal exam, if labs are reassuring I do not believe  she needs emergent CT.  Additionally, patient with right leg pain.  Pain is reproducible with palpation of the gastroc.  Likely MSK, however will obtain ultrasound to rule out DVT.  Right toe pain is most consistent with gout, patient with a history of the same.  Labs interpreted by me, overall reassuring.  No leukocytosis.  Electrolytes are stable.  Lactic is normal.  In the setting of very normal labs, will plan for follow-up with general surgery for symptomatic hernia that does not appear emergently incarcerated or obstructed.  Ultrasound negative for DVT.  As such, likely MSK pain.  Can be treated symptomatically.  Discussed likely gout as cause of toe pain, will treat with NSAIDs.  Will have patient follow-up with PCP as needed for further evaluation.  At this time, pt appears safe for discharge.  Return precautions given.  Patient states she understands and agrees to plan.  Final Clinical Impression(s) / ED Diagnoses Final diagnoses:  Ventral hernia without obstruction or gangrene  Right calf pain  Great toe pain, right    Rx / DC Orders ED Discharge Orders         Ordered    naproxen (NAPROSYN) 500 MG tablet  2 times daily with meals        09/18/19 1701    diclofenac Sodium (VOLTAREN) 1 % GEL  4 times daily        09/18/19 1701           Franchot Heidelberg, PA-C 09/18/19 1843    Long, [facility], MD 09/23/19 [phone]

## 2019-09-18 NOTE — Discharge Instructions (Signed)
Continue taking her medications as prescribed. Take naproxen 2 times a day with meals.  Do not take other anti-inflammatories at the same time (Advil, Motrin, ibuprofen, Aleve). You may supplement with Tylenol if you need further pain control. Use voltaren gel as needed for pain of your foot or leg.  Call Dr. Sunday Corn to set up a follow-up ointment if you continue to have abdominal pain. Follow-up primary care doctor as needed for further evaluation of your leg and toe pain. Return to the emergency room if you develop fevers, persistent vomiting, inability to have a bowel movement, if your abdomen becomes hard or there is color change of your hernia, with any new, worsening, or concerning symptoms.

## 2019-09-21 ENCOUNTER — Other Ambulatory Visit: Payer: Self-pay | Admitting: Endocrinology

## 2019-09-26 ENCOUNTER — Ambulatory Visit: Payer: Medicare HMO | Admitting: Endocrinology

## 2019-10-08 ENCOUNTER — Other Ambulatory Visit: Payer: Self-pay | Admitting: Endocrinology

## 2019-10-10 ENCOUNTER — Other Ambulatory Visit: Payer: Self-pay

## 2019-10-10 MED ORDER — RELION PEN NEEDLES 31G X 6 MM MISC: 0 refills | Status: DC

## 2019-10-11 ENCOUNTER — Telehealth: Payer: Self-pay | Admitting: Endocrinology

## 2019-10-11 NOTE — Telephone Encounter (Signed)
Stephanie [name] with [facility] (Patient's PCP Office) requests to be called at ph# [phone] to be given clarification of dosage for Crestor and Metformin

## 2019-10-11 NOTE — Telephone Encounter (Signed)
Spoke with Caryl Pina at [facility] and updated medications per chart.

## 2019-10-14 DIAGNOSIS — I2510 Atherosclerotic heart disease of native coronary artery without angina pectoris: Secondary | ICD-10-CM | POA: Diagnosis not present

## 2019-10-14 DIAGNOSIS — J45909 Unspecified asthma, uncomplicated: Secondary | ICD-10-CM | POA: Diagnosis not present

## 2019-10-14 DIAGNOSIS — E11311 Type 2 diabetes mellitus with unspecified diabetic retinopathy with macular edema: Secondary | ICD-10-CM | POA: Diagnosis not present

## 2019-10-14 DIAGNOSIS — E1165 Type 2 diabetes mellitus with hyperglycemia: Secondary | ICD-10-CM | POA: Diagnosis not present

## 2019-10-14 DIAGNOSIS — E7800 Pure hypercholesterolemia, unspecified: Secondary | ICD-10-CM | POA: Diagnosis not present

## 2019-10-14 DIAGNOSIS — E118 Type 2 diabetes mellitus with unspecified complications: Secondary | ICD-10-CM | POA: Diagnosis not present

## 2019-10-14 DIAGNOSIS — I10 Essential (primary) hypertension: Secondary | ICD-10-CM | POA: Diagnosis not present

## 2019-10-14 DIAGNOSIS — E119 Type 2 diabetes mellitus without complications: Secondary | ICD-10-CM | POA: Diagnosis not present

## 2019-10-15 ENCOUNTER — Other Ambulatory Visit: Payer: Self-pay

## 2019-10-15 ENCOUNTER — Ambulatory Visit (INDEPENDENT_AMBULATORY_CARE_PROVIDER_SITE_OTHER): Payer: Medicare HMO | Admitting: Endocrinology

## 2019-10-15 VITALS — BP 142/84 | HR 94 | Ht 63.0 in | Wt 252.0 lb

## 2019-10-15 DIAGNOSIS — Z794 Long term (current) use of insulin: Secondary | ICD-10-CM | POA: Diagnosis not present

## 2019-10-15 DIAGNOSIS — E782 Mixed hyperlipidemia: Secondary | ICD-10-CM | POA: Diagnosis not present

## 2019-10-15 DIAGNOSIS — I10 Essential (primary) hypertension: Secondary | ICD-10-CM

## 2019-10-15 DIAGNOSIS — E1165 Type 2 diabetes mellitus with hyperglycemia: Secondary | ICD-10-CM

## 2019-10-15 LAB — POCT GLYCOSYLATED HEMOGLOBIN (HGB A1C): Hemoglobin A1C: 7.4 % — AB (ref 4.0–5.6)

## 2019-10-15 MED ORDER — RELION PEN NEEDLES 31G X 6 MM MISC: 3 refills | Status: DC

## 2019-10-15 MED ORDER — OZEMPIC (0.25 OR 0.5 MG/DOSE) 2 MG/1.5ML ~~LOC~~ SOPN: 0.5000 mg | PEN_INJECTOR | SUBCUTANEOUS | 0 refills | Status: DC

## 2019-10-15 NOTE — Progress Notes (Signed)
Patient ID: Stephanie [name], female   DOB: 27-Nov-1955, 64 y.o.   MRN: [id number]           Reason for Appointment: Followup of diabetes and hypertension  Referring physician: Hulan Fess  History of Present Illness:          Diagnosis: Type 2 diabetes mellitus, date of diagnosis: 2003       Past history:  She was initially treated metformin at some point glimepiride also added and not clear what her level of control was in the first few years. Had been only taking 500 mg twice a day of metformin and Amaryl increased to 4 mg twice a day several years ago In the last few years her blood sugar control has been more difficult and she has been managed by an endocrinologist Because of poor control she had been tried on Janumet and [place] but she states that she could not tolerate them because of headaches Had poor control with Amaryl and metformin and also difficulty with weight gain she had started Trulicity in 16/6063 on her initial consultation Her weight on her initial visit was 240 pounds. Her A1c had gone down to 7.1% in 01/60 with using Trulicity which she was tolerating at that time However in 2/16 she stopped taking Trulicity as it was causing nausea , subsequently did not want to go on Tanzeum Because of markedly increased sugars she has been on premixed insulin since 4/16  Recent history:   INSULIN DOSE: NovoLog mix 70/30, 14 before breakfast and 40 units before supper  Tresiba 24 units daily  Oral hypoglycemic drugs the patient is taking are: Metformin 1 g once a day   A1c is 7.4, was 7.7     Current diabetes history, management, problems and blood sugar patterns:   She still has not tried the Rybelsus as discussed because of her morning routine  She says she has had more stress because of helping her mother  No labs available to assess her level of control except A1c today  Blood sugars are overall improving  This is despite her gaining about 1 pound since her  last visit  She has not started any walking program for exercise  Overall fasting readings are still relatively high but averaging better than before  Although she thinks that she can try to lose weight on her own by cutting back on portions she has lost only 3 pounds  She forgets to check readings after meals and despite reminders mostly checking them in the evening before dinner  Likely that she is forgetting her insulin sometimes causing sporadically high readings over 200 including this morning  She takes her Metformin once a day, previously twice a day when renal function was improved   Side effects from medications have been: Januvia, Onglyza apparently caused headaches, Invokana: Candidiasis   Compliance with the medical regimen: Fair    Dinner is usually at 7 pm  Glucose monitoring:  done 1 times a day or more       Glucometer: One Touch Ultra.      Blood Glucose readings by download with average readings:   PRE-MEAL Fasting Lunch Dinner Bedtime Overall  Glucose range: 153-218   132-258    Mean/median:  168   159  170   Previous readings:  PRE-MEAL Fasting Lunch Dinner Bedtime Overall  Glucose range:  126-241   163-229    Mean/median:  190   185   188   POST-MEAL PC Breakfast PC  Lunch PC Dinner  Glucose range:  138-226    Mean/median:        Glycemic control:   Lab Results  Component Value Date   HGBA1C 7.4 (A) 10/15/2019   HGBA1C 7.7 (H) 07/01/2019   HGBA1C 7.3 (H) 04/29/2019   Lab Results  Component Value Date   MICROALBUR 37.5 (H) 11/26/2018   LDLCALC 95 04/29/2019   CREATININE 1.71 (H) 09/18/2019    Self-care: The diet that the patient has been following is: tries to limit fats usually.      Meals: 2-3 meals per day. Breakfast is cereal or scrambled eggs/sausage.   Lunch is half sandwich, yogurt and fruit; dinner usually baked chicken, bread and vegetables, snacks are fruits or nuts. Not eating out regularly  Dietician visit: Most recent:  06/2015 CDE visit: 12/15              Weight history: Previous range 200-260  Wt Readings from Last 3 Encounters:  10/15/19 252 lb (114.3 kg)  09/18/19 243 lb (110.2 kg)  08/21/19 (!) 243 lb (110.2 kg)    Other active problems: discussed in review of systems   LABS:  Office Visit on 10/15/2019  Component Date Value Ref Range Status  . Hemoglobin A1C 10/15/2019 7.4* 4.0 - 5.6 % Final     Allergies as of 10/15/2019      Reactions   Avapro [irbesartan] Other (See Comments)   headaches   Codeine Nausea And Vomiting   Lisinopril Itching      Medication List       Accurate as of October 15, 2019  9:52 AM. If you have any questions, ask your nurse or doctor.        Accu-Chek FastClix Lancets Misc Use accu chek fastclix lancets to check blood sugar 2-3 times daily. DX:E11.65   Accu-Chek Guide Me w/Device Kit 1 each by Does not apply route 3 (three) times daily. Use accu chek guide me to check blood sugar 2-3 times daily. DX:E11.65   allopurinol 100 MG tablet Commonly known as: ZYLOPRIM Take 1 tablet (100 mg total) by mouth daily. For high uric acid What changed: additional instructions   diclofenac Sodium 1 % Gel Commonly known as: Voltaren Apply 2 g topically 4 (four) times daily.   furosemide 40 MG tablet Commonly known as: LASIX Take 1 tablet by mouth once daily   hydrocortisone valerate cream 0.2 % Commonly known as: WESTCORT   labetalol 100 MG tablet Commonly known as: NORMODYNE Take 1 tablet by mouth twice daily   metFORMIN 1000 MG tablet Commonly known as: GLUCOPHAGE TAKE 1 TABLET BY MOUTH TWICE DAILY WITH A MEAL   NovoLOG Mix 70/30 FlexPen (70-30) 100 UNIT/ML FlexPen Generic drug: insulin aspart protamine - aspart Inject into the skin 2 (two) times daily. Inject 14 units under the skin in the morning and 40 units in the evening.   OneTouch Ultra test strip Generic drug: glucose blood USE 1 STRIP TO CHECK GLUCOSE THREE TIMES DAILY   potassium  chloride 10 MEQ tablet Commonly known as: KLOR-CON Take 1 tablet by mouth once daily   ReliOn Pen Needles 31G X 6 MM Misc Generic drug: Insulin Pen Needle USE PEN NEEDLES TO INJECT INSULIN THREE TIMES DAILY   rosuvastatin 20 MG tablet Commonly known as: Crestor Take 1 tablet (20 mg total) by mouth daily.   Tyler Aas FlexTouch 100 UNIT/ML FlexTouch Pen Generic drug: insulin degludec Inject 24 units under the skin once daily.   valsartan 160 MG  tablet Commonly known as: DIOVAN Take 1 tablet by mouth once daily       Allergies:  Allergies  Allergen Reactions  . Avapro [Irbesartan] Other (See Comments)    headaches  . Codeine Nausea And Vomiting  . Lisinopril Itching    Past Medical History:  Diagnosis Date  . Abnormal Pap smear   . Asthma   . Diabetes mellitus   . Hyperlipidemia   . Hypertension   . LGSIL (low grade squamous intraepithelial dysplasia)     Past Surgical History:  Procedure Laterality Date  . ABDOMINAL HYSTERECTOMY    . bladder surgery    . COLPOSCOPY      Family History  Problem Relation Age of Onset  . Diabetes Mother   . Diabetes Maternal Grandmother   . Heart disease Neg Hx   . Hypertension Neg Hx     Social History:  reports that she has never smoked. She has never used smokeless tobacco. She reports that she does not drink alcohol and does not use drugs.    Review of Systems    HYPERURICEMIA:  Uric acid has been significantly high in the past and she was given allopurinol since she was likely having gout previously She was told to take allopurinol 200 mg by the nephrologist  Lab Results  Component Value Date   LABURIC 7.2 (H) 11/26/2018     Hypertension:   She is taking valsartan 160 mg, half tablet and labetalol  Occasionally checks at home also  She is followed by nephrologist also   BP Readings from Last 3 Encounters:  10/15/19 (!) 142/84  09/18/19 (!) 156/78  08/21/19 (!) 152/80   Renal function has been  variable as below She has been seen by a nephrologist    On Lasix 40 for edema; also taking potassium supplements  Lab Results  Component Value Date   CREATININE 1.71 (H) 09/18/2019   CREATININE 1.71 (H) 07/01/2019   CREATININE 2.01 (H) 04/29/2019        Lipids:    LDL has been previously high, around 180 baseline She has been regular with her Crestor and her LDL is on the last visit below 100       Lab Results  Component Value Date   CHOL 163 04/29/2019   CHOL 170 01/04/2019   CHOL 192 11/26/2018   Lab Results  Component Value Date   HDL 46.70 04/29/2019   HDL 24.20 (L) 01/04/2019   HDL 37.10 (L) 11/26/2018   Lab Results  Component Value Date   LDLCALC 95 04/29/2019   LDLCALC 114 (H) 01/04/2019   LDLCALC 125 (H) 11/26/2018   Lab Results  Component Value Date   TRIG 106.0 04/29/2019   TRIG 160.0 (H) 01/04/2019   TRIG 147.0 11/26/2018   Lab Results  Component Value Date   CHOLHDL 3 04/29/2019   CHOLHDL 7 01/04/2019   CHOLHDL 5 11/26/2018   Lab Results  Component Value Date   LDLDIRECT 89.0 12/05/2016   LDLDIRECT 183.0 10/01/2015                 Diabetic foot exam last done in 08/6759 by PCP   Complications from diabetes: Proliferative retinopathy, microalbuminuria    Physical Examination:  BP (!) 142/84   Pulse 94   Ht _0  (1.6 m)   Wt 252 lb (114.3 kg)   SpO2 98%   BMI 44.64 kg/m     No significant ankle edema present  Maculopapular slightly reddish rash  on upper arms mostly, also back of neck  ASSESSMENT:  Diabetes type 2, with obesity, insulin requiring  See history of present illness for detailed discussion of his current management, blood sugar patterns and problems identified  Her A1c is slightly better at 7.4  She is on premixed insulin, Tresiba and low-dose Metformin  Some of her high sugars are from inconsistent doses of insulin but not clear why some of her evening readings are high also Again not checking readings  after meals With her weight gain she may benefit from a GLP-1 drug and since she cannot comply with Rybelsus we will try her on Ozempic with a sample today Explained to the patient what GLP-1 drugs are, the sites of actions and the body, reduction of hunger sensation and improved insulin secretion.  Discussed the benefit of weight loss with these medications. Explained possible side effects of OZEMPIC, most commonly nausea that usually improves over time.  Also explained safety information associated with the medication Demonstrated the medication injection device and injection technique to the patient.   To start the injections with the 0.25 mg dosage weekly for the first 4 injections and then go up to 0.5 mg weekly if no continued nausea Patient instruction sheet given   Hypertension: Blood pressure is improving with using labetalol also  RENAL dysfunction: She will need to follow-up with nephrologist     There are no Patient Instructions on file for this visit.     Elayne Snare 10/15/2019, 9:52 AM   Note: This office note was prepared with Dragon voice recognition system technology. Any transcriptional errors that result from this process are unintentional.

## 2019-10-15 NOTE — Patient Instructions (Addendum)
Check blood sugars on waking up 3-4 days a week  Also check blood sugars about 2 hours after meals and do this after different meals by rotation  Recommended blood sugar levels on waking up are 90-130 and about 2 hours after meal is 130-160  Please bring your blood sugar monitor to each visit, thank you  Start OZEMPIC injections by dialing 0.25 mg on the pen as shown once weekly on the same day of the week.   You may inject in the sides of the stomach, outer thigh or arm as indicated in the brochure given. If you have any difficulties using the pen see the video at [email or web address]  You will feel fullness of the stomach with starting the medication and should try to keep the portions at meals small.  You may experience nausea in the first few days which usually gets better over time    After 4 weeks increase the dose to 0.5 mg weekly  If you have any questions or persistent side effects please call the office   You may also talk to a nurse educator with [employer] at [phone] Useful website: [email or web address]   Start walking  Use new strips

## 2019-10-26 ENCOUNTER — Other Ambulatory Visit: Payer: Self-pay | Admitting: Endocrinology

## 2019-10-28 MED ORDER — LABETALOL HCL 100 MG PO TABS
100.0000 mg | ORAL_TABLET | Freq: Two times a day (BID) | ORAL | 0 refills | Status: DC
Start: 2019-10-28 — End: 2019-10-30

## 2019-10-28 MED ORDER — TRESIBA FLEXTOUCH 100 UNIT/ML ~~LOC~~ SOPN
PEN_INJECTOR | SUBCUTANEOUS | 0 refills | Status: DC
Start: 2019-10-28 — End: 2020-04-01

## 2019-10-28 NOTE — Addendum Note (Signed)
Addended by: Amado Coe on: 10/28/2019 02:59 PM   Modules accepted: Orders

## 2019-10-30 ENCOUNTER — Other Ambulatory Visit: Payer: Self-pay | Admitting: Endocrinology

## 2019-11-26 DIAGNOSIS — R202 Paresthesia of skin: Secondary | ICD-10-CM | POA: Diagnosis not present

## 2019-11-26 DIAGNOSIS — R21 Rash and other nonspecific skin eruption: Secondary | ICD-10-CM | POA: Diagnosis not present

## 2019-11-27 ENCOUNTER — Ambulatory Visit: Payer: Medicare HMO | Admitting: Endocrinology

## 2019-12-03 ENCOUNTER — Other Ambulatory Visit: Payer: Self-pay | Admitting: Endocrinology

## 2019-12-04 ENCOUNTER — Encounter: Payer: Self-pay | Admitting: Endocrinology

## 2019-12-04 ENCOUNTER — Other Ambulatory Visit: Payer: Self-pay | Admitting: *Deleted

## 2019-12-04 ENCOUNTER — Ambulatory Visit (INDEPENDENT_AMBULATORY_CARE_PROVIDER_SITE_OTHER): Payer: Medicare PPO | Admitting: Endocrinology

## 2019-12-04 ENCOUNTER — Other Ambulatory Visit: Payer: Self-pay

## 2019-12-04 VITALS — BP 134/84 | HR 97 | Ht 63.0 in | Wt 246.4 lb

## 2019-12-04 DIAGNOSIS — Z794 Long term (current) use of insulin: Secondary | ICD-10-CM

## 2019-12-04 DIAGNOSIS — E1165 Type 2 diabetes mellitus with hyperglycemia: Secondary | ICD-10-CM

## 2019-12-04 DIAGNOSIS — I10 Essential (primary) hypertension: Secondary | ICD-10-CM | POA: Diagnosis not present

## 2019-12-04 MED ORDER — OZEMPIC (0.25 OR 0.5 MG/DOSE) 2 MG/1.5ML ~~LOC~~ SOPN: 0.5000 mg | PEN_INJECTOR | SUBCUTANEOUS | 2 refills | Status: DC

## 2019-12-04 MED ORDER — ALLOPURINOL 100 MG PO TABS
200.0000 mg | ORAL_TABLET | Freq: Every day | ORAL | 3 refills | Status: DC
Start: 2019-12-04 — End: 2021-01-28

## 2019-12-04 MED ORDER — ROSUVASTATIN CALCIUM 20 MG PO TABS
20.0000 mg | ORAL_TABLET | Freq: Every day | ORAL | 1 refills | Status: DC
Start: 2019-12-04 — End: 2020-10-15

## 2019-12-04 MED ORDER — OZEMPIC (0.25 OR 0.5 MG/DOSE) 2 MG/1.5ML ~~LOC~~ SOPN
0.5000 mg | PEN_INJECTOR | SUBCUTANEOUS | 2 refills | Status: DC
Start: 2019-12-04 — End: 2019-12-04

## 2019-12-04 NOTE — Patient Instructions (Addendum)
Check blood sugars on waking up 2-3 days a week  Also check blood sugars about 2 hours after meals and do this after different meals by rotation  Recommended blood sugar levels on waking up are 90-130 and about 2 hours after meal is 130-160  Please bring your blood sugar monitor to each visit, thank you  Stay with Ozempic 0.5 weekly  Walk daily

## 2019-12-04 NOTE — Progress Notes (Signed)
Patient ID: Stephanie [name], female   DOB: [birth year], 64 y.o.   MRN: [id number]           Reason for Appointment: Followup of diabetes and hypertension  Referring physician: Hulan Fess  History of Present Illness:          Diagnosis: Type 2 diabetes mellitus, date of diagnosis: 2003       Past history:  She was initially treated metformin at some point glimepiride also added and not clear what her level of control was in the first few years. Had been only taking 500 mg twice a day of metformin and Amaryl increased to 4 mg twice a day several years ago In the last few years her blood sugar control has been more difficult and she has been managed by an endocrinologist Because of poor control she had been tried on Janumet and [place] but she states that she could not tolerate them because of headaches Had poor control with Amaryl and metformin and also difficulty with weight gain she had started Trulicity in 12/7515 on her initial consultation Her weight on her initial visit was 240 pounds. Her A1c had gone down to 7.1% in 00/17 with using Trulicity which she was tolerating at that time However in 2/16 she stopped taking Trulicity as it was causing nausea , subsequently did not want to go on Tanzeum Because of markedly increased sugars she has been on premixed insulin since 4/16  Recent history:   INSULIN DOSE: NovoLog mix 70/30, 14 before breakfast and 40 units before supper  Tresiba 24 units daily  Oral hypoglycemic drugs the patient is taking are: Metformin 1 g once a day, Ozempic 0.5 mg weekly   A1c is last 7.4, was 7.7   Current diabetes history, management, problems and blood sugar patterns:   She was able to start Ozempic with a sample and she thinks she took the 0.5 mg dose this week without any nausea  She may be having increased satiety as she apparently had only 1 meal yesterday  She however forgets to check her sugars after meals and not clear if she is taking some  readings after dinner at night, some of her readings are as late as 10-11 PM  No hypoglycemia but her blood sugars are considerably better and except for one high reading fairly stable both morning and evening  No recent labs available  She has not changed her insulin dose and still takes 40 units NovoLog mix in the evening regardless of whether she is eating or not  She takes her Metformin once a day, previously twice a day when renal function was improved   Side effects from medications have been: Januvia, Onglyza apparently caused headaches, Invokana: Candidiasis   Compliance with the medical regimen: Fair    Dinner is usually at 7 pm  Glucose monitoring:  done 1 times a day or more       Glucometer:  Accu-Chek guide .      Blood Glucose readings by download with average readings:   PRE-MEAL Fasting Lunch Dinner Bedtime Overall  Glucose range:  111-175   113-227  103-227  Mean/median:  130   150  133 138   POST-MEAL PC Breakfast PC Lunch PC Dinner  Glucose range:   ?  Mean/median:      Previous data:  PRE-MEAL Fasting Lunch Dinner Bedtime Overall  Glucose range: 153-218   132-258    Mean/median:  168   159  170  Glycemic control:   Lab Results  Component Value Date   HGBA1C 7.4 (A) 10/15/2019   HGBA1C 7.7 (H) 07/01/2019   HGBA1C 7.3 (H) 04/29/2019   Lab Results  Component Value Date   MICROALBUR 37.5 (H) 11/26/2018   LDLCALC 95 04/29/2019   CREATININE 1.71 (H) 09/18/2019    Self-care: The diet that the patient has been following is: tries to limit fats usually.      Meals: 2-3 meals per day. Breakfast is cereal or scrambled eggs/sausage.   Lunch is half sandwich, yogurt and fruit; dinner usually baked chicken, bread and vegetables, snacks are fruits or nuts. Not eating out regularly  Dietician visit: Most recent: 06/2015 CDE visit: 12/15              Weight history: Previous range 200-260  Wt Readings from Last 3 Encounters:  12/04/19 246 lb 6.4 oz  (111.8 kg)  10/15/19 252 lb (114.3 kg)  09/18/19 243 lb (110.2 kg)    Other active problems: discussed in review of systems   LABS:  No visits with results within 1 Week(s) from this visit.  Latest known visit with results is:  Office Visit on 10/15/2019  Component Date Value Ref Range Status  . Hemoglobin A1C 10/15/2019 7.4* 4.0 - 5.6 % Final     Allergies as of 12/04/2019      Reactions   Avapro [irbesartan] Other (See Comments)   headaches   Codeine Nausea And Vomiting   Lisinopril Itching      Medication List       Accurate as of December 04, 2019 10:10 AM. If you have any questions, ask your nurse or doctor.        Accu-Chek FastClix Lancets Misc Use accu chek fastclix lancets to check blood sugar 2-3 times daily. DX:E11.65   Accu-Chek Guide Me w/Device Kit 1 each by Does not apply route 3 (three) times daily. Use accu chek guide me to check blood sugar 2-3 times daily. DX:E11.65   allopurinol 100 MG tablet Commonly known as: ZYLOPRIM Take 1 tablet (100 mg total) by mouth daily. For high uric acid What changed: additional instructions   diclofenac Sodium 1 % Gel Commonly known as: Voltaren Apply 2 g topically 4 (four) times daily.   furosemide 40 MG tablet Commonly known as: LASIX Take 1 tablet by mouth once daily   hydrocortisone valerate cream 0.2 % Commonly known as: WESTCORT   labetalol 100 MG tablet Commonly known as: NORMODYNE Take 1 tablet by mouth twice daily   metFORMIN 1000 MG tablet Commonly known as: GLUCOPHAGE TAKE 1 TABLET BY MOUTH TWICE DAILY WITH A MEAL What changed: See the new instructions.   NovoLOG 70/30 FlexPen ReliOn (70-30) 100 UNIT/ML FlexPen Generic drug: insulin aspart protamine - aspart INJECT 18 UNITS UNDER THE SKIN IN THE MORNING AND 48 UNITS IN THE EVENING What changed: See the new instructions.   OneTouch Ultra test strip Generic drug: glucose blood USE 1 STRIP TO CHECK GLUCOSE THREE TIMES DAILY   Ozempic  (0.25 or 0.5 MG/DOSE) 2 MG/1.5ML Sopn Generic drug: Semaglutide(0.25 or 0.5MG/DOS) Inject 0.5 mg into the skin once a week.   potassium chloride 10 MEQ tablet Commonly known as: KLOR-CON Take 1 tablet by mouth once daily   ReliOn Pen Needles 31G X 6 MM Misc Generic drug: Insulin Pen Needle USE PEN NEEDLES TO INJECT INSULIN THREE TIMES DAILY   rosuvastatin 20 MG tablet Commonly known as: CRESTOR Take 1 tablet by mouth once daily  Tyler Aas FlexTouch 100 UNIT/ML FlexTouch Pen Generic drug: insulin degludec INJECT 24 UNITS SUBCUTANEOUSLY ONCE DAILY   valsartan 160 MG tablet Commonly known as: DIOVAN Take 1 tablet by mouth once daily       Allergies:  Allergies  Allergen Reactions  . Avapro [Irbesartan] Other (See Comments)    headaches  . Codeine Nausea And Vomiting  . Lisinopril Itching    Past Medical History:  Diagnosis Date  . Abnormal Pap smear   . Asthma   . Diabetes mellitus   . Hyperlipidemia   . Hypertension   . LGSIL (low grade squamous intraepithelial dysplasia)     Past Surgical History:  Procedure Laterality Date  . ABDOMINAL HYSTERECTOMY    . bladder surgery    . COLPOSCOPY      Family History  Problem Relation Age of Onset  . Diabetes Mother   . Diabetes Maternal Grandmother   . Heart disease Neg Hx   . Hypertension Neg Hx     Social History:  reports that she has never smoked. She has never used smokeless tobacco. She reports that she does not drink alcohol and does not use drugs.    Review of Systems    HYPERURICEMIA:  Uric acid has been significantly high in the past and she was given allopurinol since she was likely having gout previously She was told to take allopurinol 200 mg by the nephrologist  Lab Results  Component Value Date   LABURIC 7.2 (H) 11/26/2018     Hypertension:   She is taking valsartan 160 mg, half tablet and labetalol  Occasionally checks at home   She is followed by nephrologist also   BP Readings  from Last 3 Encounters:  12/04/19 134/84  10/15/19 (!) 142/84  09/18/19 (!) 156/78   Renal function has been variable as below She has been seen by a nephrologist    On Lasix 40 for edema; is taking potassium supplements  Lab Results  Component Value Date   CREATININE 1.71 (H) 09/18/2019   CREATININE 1.71 (H) 07/01/2019   CREATININE 2.01 (H) 04/29/2019   Lab Results  Component Value Date   K 3.9 09/18/2019         Lipids:    LDL has been previously high, around 180 baseline She has been regular with her Crestor and her LDL is on the last visit below 100       Lab Results  Component Value Date   CHOL 163 04/29/2019   CHOL 170 01/04/2019   CHOL 192 11/26/2018   Lab Results  Component Value Date   HDL 46.70 04/29/2019   HDL 24.20 (L) 01/04/2019   HDL 37.10 (L) 11/26/2018   Lab Results  Component Value Date   LDLCALC 95 04/29/2019   LDLCALC 114 (H) 01/04/2019   LDLCALC 125 (H) 11/26/2018   Lab Results  Component Value Date   TRIG 106.0 04/29/2019   TRIG 160.0 (H) 01/04/2019   TRIG 147.0 11/26/2018   Lab Results  Component Value Date   CHOLHDL 3 04/29/2019   CHOLHDL 7 01/04/2019   CHOLHDL 5 11/26/2018   Lab Results  Component Value Date   LDLDIRECT 89.0 12/05/2016   LDLDIRECT 183.0 10/01/2015                 Diabetic foot exam last done in 08/1854 by PCP   Complications from diabetes: Proliferative retinopathy, microalbuminuria    Physical Examination:  BP 134/84   Pulse 97   Ht 5'  3" (1.6 m)   Wt 246 lb 6.4 oz (111.8 kg)   SpO2 96%   BMI 43.65 kg/m     No significant ankle edema present  Maculopapular slightly reddish rash on upper arms mostly, also back of neck  ASSESSMENT:  Diabetes type 2, with obesity, insulin requiring  See history of present illness for detailed discussion of his current management, blood sugar patterns and problems identified  Her A1c is last better at 7.4  She is on premixed insulin, Tresiba, Ozempic  and low-dose Metformin  Her blood sugars are significantly better with adding Ozempic even before she increase the dose to 0.5 mg Previously had average blood sugars about 180 and now at about 138 However checking readings mostly fasting and before meals in the evening She is tolerating Ozempic and prefers this to daily Rybelsus in the morning for better compliance  She will be given a prescription for the 0.5 Ozempic and advised her to continue regularly She will also let us know if she is having any decrease in blood sugars below 90 and will reduce her insulin if needed In the meantime she needs to start working on her weight loss better with increased exercise either with walking or indoor videos Emphasized the need to check blood sugars after meals   Hypertension: Blood pressure is really well controlled   RENAL dysfunction: She will need continued follow-up with nephrologist     There are no Patient Instructions on file for this visit.     Elayne Snare 12/04/2019, 10:10 AM   Note: This office note was prepared with Dragon voice recognition system technology. Any transcriptional errors that result from this process are unintentional.

## 2019-12-13 DIAGNOSIS — I129 Hypertensive chronic kidney disease with stage 1 through stage 4 chronic kidney disease, or unspecified chronic kidney disease: Secondary | ICD-10-CM | POA: Diagnosis not present

## 2019-12-13 DIAGNOSIS — Z794 Long term (current) use of insulin: Secondary | ICD-10-CM | POA: Diagnosis not present

## 2019-12-13 DIAGNOSIS — N1832 Chronic kidney disease, stage 3b: Secondary | ICD-10-CM | POA: Diagnosis not present

## 2019-12-13 DIAGNOSIS — E1122 Type 2 diabetes mellitus with diabetic chronic kidney disease: Secondary | ICD-10-CM | POA: Diagnosis not present

## 2019-12-13 DIAGNOSIS — N189 Chronic kidney disease, unspecified: Secondary | ICD-10-CM | POA: Diagnosis not present

## 2019-12-13 DIAGNOSIS — M109 Gout, unspecified: Secondary | ICD-10-CM | POA: Diagnosis not present

## 2019-12-13 DIAGNOSIS — D631 Anemia in chronic kidney disease: Secondary | ICD-10-CM | POA: Diagnosis not present

## 2019-12-13 DIAGNOSIS — N2581 Secondary hyperparathyroidism of renal origin: Secondary | ICD-10-CM | POA: Diagnosis not present

## 2019-12-25 DIAGNOSIS — H35033 Hypertensive retinopathy, bilateral: Secondary | ICD-10-CM | POA: Diagnosis not present

## 2019-12-25 DIAGNOSIS — Z794 Long term (current) use of insulin: Secondary | ICD-10-CM | POA: Diagnosis not present

## 2019-12-25 DIAGNOSIS — E113313 Type 2 diabetes mellitus with moderate nonproliferative diabetic retinopathy with macular edema, bilateral: Secondary | ICD-10-CM | POA: Diagnosis not present

## 2019-12-25 DIAGNOSIS — H2513 Age-related nuclear cataract, bilateral: Secondary | ICD-10-CM | POA: Diagnosis not present

## 2019-12-26 DIAGNOSIS — E1165 Type 2 diabetes mellitus with hyperglycemia: Secondary | ICD-10-CM | POA: Diagnosis not present

## 2019-12-26 DIAGNOSIS — J45909 Unspecified asthma, uncomplicated: Secondary | ICD-10-CM | POA: Diagnosis not present

## 2019-12-26 DIAGNOSIS — E7800 Pure hypercholesterolemia, unspecified: Secondary | ICD-10-CM | POA: Diagnosis not present

## 2019-12-26 DIAGNOSIS — I10 Essential (primary) hypertension: Secondary | ICD-10-CM | POA: Diagnosis not present

## 2019-12-26 DIAGNOSIS — E118 Type 2 diabetes mellitus with unspecified complications: Secondary | ICD-10-CM | POA: Diagnosis not present

## 2019-12-26 DIAGNOSIS — E11311 Type 2 diabetes mellitus with unspecified diabetic retinopathy with macular edema: Secondary | ICD-10-CM | POA: Diagnosis not present

## 2019-12-26 DIAGNOSIS — E119 Type 2 diabetes mellitus without complications: Secondary | ICD-10-CM | POA: Diagnosis not present

## 2019-12-26 DIAGNOSIS — I2510 Atherosclerotic heart disease of native coronary artery without angina pectoris: Secondary | ICD-10-CM | POA: Diagnosis not present

## 2019-12-30 ENCOUNTER — Other Ambulatory Visit: Payer: Self-pay | Admitting: Endocrinology

## 2020-02-03 ENCOUNTER — Other Ambulatory Visit: Payer: Self-pay | Admitting: Endocrinology

## 2020-02-04 NOTE — Telephone Encounter (Signed)
Ok to refill? Last prescribed on 10/28/2019 #90 with 0 refills. Last OV on 12/04/2019. I did not see it was mention on last OV. Next appointment on 02/12/2020

## 2020-02-05 ENCOUNTER — Other Ambulatory Visit (INDEPENDENT_AMBULATORY_CARE_PROVIDER_SITE_OTHER): Payer: Medicare PPO

## 2020-02-05 ENCOUNTER — Other Ambulatory Visit: Payer: Self-pay

## 2020-02-05 DIAGNOSIS — E782 Mixed hyperlipidemia: Secondary | ICD-10-CM | POA: Diagnosis not present

## 2020-02-05 DIAGNOSIS — E1165 Type 2 diabetes mellitus with hyperglycemia: Secondary | ICD-10-CM

## 2020-02-05 DIAGNOSIS — Z794 Long term (current) use of insulin: Secondary | ICD-10-CM

## 2020-02-05 LAB — HEMOGLOBIN A1C: Hgb A1c MFr Bld: 6.3 % (ref 4.6–6.5)

## 2020-02-05 LAB — BASIC METABOLIC PANEL
BUN: 24 mg/dL — ABNORMAL HIGH (ref 6–23)
CO2: 32 mEq/L (ref 19–32)
Calcium: 9.9 mg/dL (ref 8.4–10.5)
Chloride: 102 mEq/L (ref 96–112)
Creatinine, Ser: 1.66 mg/dL — ABNORMAL HIGH (ref 0.40–1.20)
GFR: 32 mL/min — ABNORMAL LOW (ref >60.00)
Glucose, Bld: 113 mg/dL — ABNORMAL HIGH (ref 70–99)
Potassium: 4.6 mEq/L (ref 3.5–5.1)
Sodium: 140 mEq/L (ref 135–145)

## 2020-02-05 LAB — LIPID PANEL
Cholesterol: 170 mg/dL (ref 0–200)
HDL: 43.1 mg/dL (ref >39.00)
LDL Cholesterol: 108 mg/dL — ABNORMAL HIGH (ref 0–99)
NonHDL: 126.54
Total CHOL/HDL Ratio: 4
Triglycerides: 95 mg/dL (ref 0.0–149.0)
VLDL: 19 mg/dL (ref 0.0–40.0)

## 2020-02-10 ENCOUNTER — Encounter: Payer: Medicare PPO | Admitting: Endocrinology

## 2020-02-10 ENCOUNTER — Other Ambulatory Visit: Payer: Self-pay

## 2020-02-10 ENCOUNTER — Ambulatory Visit: Payer: Medicare PPO | Admitting: Endocrinology

## 2020-02-10 NOTE — Progress Notes (Signed)
This encounter was created in error - please disregard.

## 2020-02-12 ENCOUNTER — Ambulatory Visit: Payer: Medicare PPO | Admitting: Endocrinology

## 2020-03-03 ENCOUNTER — Other Ambulatory Visit: Payer: Self-pay | Admitting: Endocrinology

## 2020-03-03 ENCOUNTER — Other Ambulatory Visit: Payer: Self-pay

## 2020-03-03 ENCOUNTER — Encounter: Payer: Self-pay | Admitting: Endocrinology

## 2020-03-03 ENCOUNTER — Ambulatory Visit (INDEPENDENT_AMBULATORY_CARE_PROVIDER_SITE_OTHER): Payer: Medicare PPO | Admitting: Endocrinology

## 2020-03-03 VITALS — BP 136/84 | HR 90 | Ht 63.0 in | Wt 241.2 lb

## 2020-03-03 DIAGNOSIS — E1169 Type 2 diabetes mellitus with other specified complication: Secondary | ICD-10-CM | POA: Diagnosis not present

## 2020-03-03 DIAGNOSIS — E1121 Type 2 diabetes mellitus with diabetic nephropathy: Secondary | ICD-10-CM

## 2020-03-03 DIAGNOSIS — I10 Essential (primary) hypertension: Secondary | ICD-10-CM

## 2020-03-03 DIAGNOSIS — E669 Obesity, unspecified: Secondary | ICD-10-CM | POA: Diagnosis not present

## 2020-03-03 NOTE — Patient Instructions (Addendum)
Novolog 10 units in am  Start U-tube video exercise  Check blood sugars on waking up 3-4 days a week  Also check blood sugars about 2 hours after meals and do this after different meals by rotation  Recommended blood sugar levels on waking up are 90-130 and about 2 hours after meal is 130-160  Please bring your blood sugar monitor to each visit, thank you

## 2020-03-03 NOTE — Progress Notes (Unsigned)
Patient ID: Stephanie [name], female   DOB: 17-Sep-1955, 65 y.o.   MRN: [id number]           Reason for Appointment: Followup of diabetes and hypertension  Referring physician: Hulan Fess  History of Present Illness:          Diagnosis: Type 2 diabetes mellitus, date of diagnosis: 2003       Past history:  She was initially treated metformin at some point glimepiride also added and not clear what her level of control was in the first few years. Had been only taking 500 mg twice a day of metformin and Amaryl increased to 4 mg twice a day several years ago In the last few years her blood sugar control has been more difficult and she has been managed by an endocrinologist Because of poor control she had been tried on Janumet and [place] but she states that she could not tolerate them because of headaches Had poor control with Amaryl and metformin and also difficulty with weight gain she had started Trulicity in 71/6967 on her initial consultation Her weight on her initial visit was 240 pounds. Her A1c had gone down to 7.1% in 89/38 with using Trulicity which she was tolerating at that time However in 2/16 she stopped taking Trulicity as it was causing nausea , subsequently did not want to go on Tanzeum Because of markedly increased sugars she has been on premixed insulin since 4/16  Recent history:   INSULIN DOSE: NovoLog mix 70/30, 14 before breakfast and 40 units before supper  Tresiba 24 units daily  Oral hypoglycemic drugs the patient is taking are: Metformin 1 g once a day, Ozempic 0.5 mg weekly   A1c is markedly improved at 6.3 compared to 7.4  Current diabetes history, management, problems and blood sugar patterns:   She appears to be benefiting significantly from adding Ozempic to her regimen of insulin and Metformin  She is also able to continue losing weight, 5 pounds since last time  With this she is able to cut back on her portions  She is still having some  carbohydrate cravings for sweets but is trying to limit them  However has not done much exercise as recommended  Has had only 4 readings over target in the late afternoon or evening but none recently  Generally fasting readings are slightly higher than reading before dinnertime  No hypoglycemic symptoms even with blood sugar is as low as 72  Again not clear if she is checking readings after dinner consistently and still appears to be doing most of her readings before breakfast and dinner   Side effects from medications have been: Januvia, Onglyza apparently caused headaches, Invokana: Candidiasis   Compliance with the medical regimen: Fair    Dinner is usually at 7 pm  Glucose monitoring:  done 1 times a day or more       Glucometer:  Accu-Chek guide .      Blood Glucose readings by download with average readings:   PRE-MEAL Fasting Lunch Dinner Bedtime Overall  Glucose range:      72-227  Mean/median:  112   119   120   POST-MEAL PC Breakfast PC Lunch PC Dinner  Glucose range:     Mean/median:    137    PREVIOUSLY:  PRE-MEAL Fasting Lunch Dinner Bedtime Overall  Glucose range:  111-175   113-227  103-227  Mean/median:  130   150  133 138   POST-MEAL PC Breakfast  PC Lunch PC Dinner  Glucose range:   ?  Mean/median:       Glycemic control:   Lab Results  Component Value Date   HGBA1C 6.3 02/05/2020   HGBA1C 7.4 (A) 10/15/2019   HGBA1C 7.7 (H) 07/01/2019   Lab Results  Component Value Date   MICROALBUR 37.5 (H) 11/26/2018   LDLCALC 108 (H) 02/05/2020   CREATININE 1.66 (H) 02/05/2020    Self-care: The diet that the patient has been following is: tries to limit fats usually.      Meals: 2-3 meals per day. Breakfast is cereal or scrambled eggs/sausage.   Lunch is half sandwich, yogurt and fruit; dinner usually baked chicken, bread and vegetables, snacks are fruits or nuts. Not eating out regularly  Dietician visit: Most recent: 06/2015 CDE visit: 12/15               Weight history: Previous range 200-260  Wt Readings from Last 3 Encounters:  03/03/20 241 lb 3.2 oz (109.4 kg)  12/04/19 246 lb 6.4 oz (111.8 kg)  10/15/19 252 lb (114.3 kg)    Other active problems: discussed in review of systems   LABS:  No visits with results within 1 Week(s) from this visit.  Latest known visit with results is:  Lab on 02/05/2020  Component Date Value Ref Range Status  . Hgb A1c MFr Bld 02/05/2020 6.3  4.6 - 6.5 % Final   Glycemic Control Guidelines for People with Diabetes:Non Diabetic:  <6%Goal of Therapy: <7%Additional Action Suggested:  >8%   . Cholesterol 02/05/2020 170  0 - 200 mg/dL Final   ATP III Classification       Desirable:  < 200 mg/dL               Borderline High:  200 - 239 mg/dL          High:  > = 240 mg/dL  . Triglycerides 02/05/2020 95.0  0.0 - 149.0 mg/dL Final   Normal:  <150 mg/dLBorderline High:  150 - 199 mg/dL  . HDL 02/05/2020 43.10  >39.00 mg/dL Final  . VLDL 02/05/2020 19.0  0.0 - 40.0 mg/dL Final  . LDL Cholesterol 02/05/2020 108* 0 - 99 mg/dL Final  . Total CHOL/HDL Ratio 02/05/2020 4   Final                  Men          Women1/2 Average Risk     3.4          3.3Average Risk          5.0          4.42X Average Risk          9.6          7.13X Average Risk          15.0          11.0                      . NonHDL 02/05/2020 126.54   Final   NOTE:  Non-HDL goal should be 30 mg/dL higher than patient's LDL goal (i.e. LDL goal of < 70 mg/dL, would have non-HDL goal of < 100 mg/dL)  . Sodium 02/05/2020 140  135 - 145 mEq/L Final  . Potassium 02/05/2020 4.6  3.5 - 5.1 mEq/L Final  . Chloride 02/05/2020 102  96 - 112 mEq/L Final  . CO2 02/05/2020 32  19 -  32 mEq/L Final  . Glucose, Bld 02/05/2020 113* 70 - 99 mg/dL Final  . BUN 02/05/2020 24* 6 - 23 mg/dL Final  . Creatinine, Ser 02/05/2020 1.66* 0.40 - 1.20 mg/dL Final  . GFR 02/05/2020 32.00* >60.00 mL/min Final   Calculated using the CKD-EPI Creatinine Equation (2021)  .  Calcium 02/05/2020 9.9  8.4 - 10.5 mg/dL Final     Allergies as of 03/03/2020      Reactions   Avapro [irbesartan] Other (See Comments)   headaches   Codeine Nausea And Vomiting   Lisinopril Itching      Medication List       Accurate as of March 03, 2020  2:06 PM. If you have any questions, ask your nurse or doctor.        Accu-Chek FastClix Lancets Misc Use accu chek fastclix lancets to check blood sugar 2-3 times daily. DX:E11.65   Accu-Chek Guide Me w/Device Kit 1 each by Does not apply route 3 (three) times daily. Use accu chek guide me to check blood sugar 2-3 times daily. DX:E11.65   Accu-Chek Guide test strip Generic drug: glucose blood USE 1 STRIP TO CHECK GLUCOSE 2 TO 3 TIMES DAILY   allopurinol 100 MG tablet Commonly known as: ZYLOPRIM Take 2 tablets (200 mg total) by mouth daily. For high uric acid   diclofenac Sodium 1 % Gel Commonly known as: Voltaren Apply 2 g topically 4 (four) times daily.   furosemide 40 MG tablet Commonly known as: LASIX Take 1 tablet by mouth once daily   hydrocortisone valerate cream 0.2 % Commonly known as: WESTCORT   labetalol 100 MG tablet Commonly known as: NORMODYNE Take 1 tablet by mouth twice daily   metFORMIN 1000 MG tablet Commonly known as: GLUCOPHAGE Take 1 tablet (1,000 mg total) by mouth daily with breakfast. Patient is taking 1 a day   NovoLOG 70/30 FlexPen ReliOn (70-30) 100 UNIT/ML FlexPen Generic drug: insulin aspart protamine - aspart INJECT 18 UNITS UNDER THE SKIN IN THE MORNING AND 48 UNITS IN THE EVENING What changed: See the new instructions.   Ozempic (0.25 or 0.5 MG/DOSE) 2 MG/1.5ML Sopn Generic drug: Semaglutide(0.25 or 0.5MG /DOS) Inject 0.5 mg into the skin once a week.   potassium chloride 10 MEQ tablet Commonly known as: KLOR-CON Take 1 tablet by mouth once daily   ReliOn Pen Needles 31G X 6 MM Misc Generic drug: Insulin Pen Needle USE PEN NEEDLES TO INJECT INSULIN THREE TIMES DAILY    rosuvastatin 20 MG tablet Commonly known as: CRESTOR Take 1 tablet (20 mg total) by mouth daily.   Tyler Aas FlexTouch 100 UNIT/ML FlexTouch Pen Generic drug: insulin degludec INJECT 24 UNITS SUBCUTANEOUSLY ONCE DAILY   valsartan 160 MG tablet Commonly known as: DIOVAN Take 1 tablet by mouth once daily       Allergies:  Allergies  Allergen Reactions  . Avapro [Irbesartan] Other (See Comments)    headaches  . Codeine Nausea And Vomiting  . Lisinopril Itching    Past Medical History:  Diagnosis Date  . Abnormal Pap smear   . Asthma   . Diabetes mellitus   . Hyperlipidemia   . Hypertension   . LGSIL (low grade squamous intraepithelial dysplasia)     Past Surgical History:  Procedure Laterality Date  . ABDOMINAL HYSTERECTOMY    . bladder surgery    . COLPOSCOPY      Family History  Problem Relation Age of Onset  . Diabetes Mother   . Diabetes Maternal  Grandmother   . Heart disease Neg Hx   . Hypertension Neg Hx     Social History:  reports that she has never smoked. She has never used smokeless tobacco. She reports that she does not drink alcohol and does not use drugs.    Review of Systems    HYPERURICEMIA:  Uric acid has been significantly high in the past and she was given allopurinol since she was likely having gout previously Prescribed allopurinol 200 mg by the nephrologist  Lab Results  Component Value Date   LABURIC 7.2 (H) 11/26/2018     Hypertension:   She is taking valsartan 160 mg, half tablet and labetalol  She is followed by nephrologist also   BP Readings from Last 3 Encounters:  03/03/20 136/84  12/04/19 134/84  10/15/19 (!) 142/84   Renal function has been variable as below She has been seen by a nephrologist   On Lasix 40 for edema with good control; is taking potassium supplements  Lab Results  Component Value Date   CREATININE 1.66 (H) 02/05/2020   CREATININE 1.71 (H) 09/18/2019   CREATININE 1.71 (H) 07/01/2019    Lab Results  Component Value Date   K 4.6 02/05/2020         Lipids:    LDL has been previously high, around 180 baseline She has been regular with her Crestor and her LDL is recently below 100       Lab Results  Component Value Date   CHOL 170 02/05/2020   CHOL 163 04/29/2019   CHOL 170 01/04/2019   Lab Results  Component Value Date   HDL 43.10 02/05/2020   HDL 46.70 04/29/2019   HDL 24.20 (L) 01/04/2019   Lab Results  Component Value Date   LDLCALC 108 (H) 02/05/2020   LDLCALC 95 04/29/2019   LDLCALC 114 (H) 01/04/2019   Lab Results  Component Value Date   TRIG 95.0 02/05/2020   TRIG 106.0 04/29/2019   TRIG 160.0 (H) 01/04/2019   Lab Results  Component Value Date   CHOLHDL 4 02/05/2020   CHOLHDL 3 04/29/2019   CHOLHDL 7 01/04/2019   Lab Results  Component Value Date   LDLDIRECT 89.0 12/05/2016   LDLDIRECT 183.0 10/01/2015                 Diabetic foot exam last done in 01/6107 by PCP   Complications from diabetes: Proliferative retinopathy, microalbuminuria    Physical Examination:  BP 136/84   Pulse 90   Ht $R'5\' 3"'wn$  (1.6 m)   Wt 241 lb 3.2 oz (109.4 kg)   SpO2 99%   BMI 42.73 kg/m       ASSESSMENT:  Diabetes type 2, with obesity, insulin requiring  See history of present illness for detailed discussion of his current management, blood sugar patterns and problems identified  Her A1c is 6.3 which is the best in several years  She is on premixed insulin twice a day, Tresiba, Ozempic and low-dose Metformin  Her blood sugars are consistently controlled although not checking enough readings after meals Only has 4 readings above 180 in the last month She still tends to have some carbohydrate cravings which may raise her sugars but otherwise with Ozempic has had portion control and weight loss Fasting readings are averaging only 112 without any overnight hypoglycemia She does have increased satiety with Ozempic and gradual weight loss  continues  Today explained to her the A1c results and what her average blood sugars are  Encouraged her to continue watching her diet and also increase exercise Explained to her that she can taper down insulin further if she is able to lose weight Since blood sugars are low normal before dinnertime she can reduce her morning insulin to 10 units instead of 14 If she has tendency to weight gain again will consider 1 mg Ozempic She does need to check more readings after meals Explained to her that if she has carbohydrate craving she can have small amounts of low fat desserts or candy  Hypertension: Blood pressure is overall well controlled To continue Lasix for edema  Lipids: Controlled with LDL 95 on Crestor  RENAL dysfunction: Stable, she will need continued follow-up with nephrologist  Follow-up in 3 months   There are no Patient Instructions on file for this visit.     Elayne Snare 03/03/2020, 2:06 PM   Note: This office note was prepared with Dragon voice recognition system technology. Any transcriptional errors that result from this process are unintentional.

## 2020-03-18 DIAGNOSIS — E118 Type 2 diabetes mellitus with unspecified complications: Secondary | ICD-10-CM | POA: Diagnosis not present

## 2020-03-18 DIAGNOSIS — E7800 Pure hypercholesterolemia, unspecified: Secondary | ICD-10-CM | POA: Diagnosis not present

## 2020-03-18 DIAGNOSIS — E11311 Type 2 diabetes mellitus with unspecified diabetic retinopathy with macular edema: Secondary | ICD-10-CM | POA: Diagnosis not present

## 2020-03-18 DIAGNOSIS — I10 Essential (primary) hypertension: Secondary | ICD-10-CM | POA: Diagnosis not present

## 2020-03-18 DIAGNOSIS — E785 Hyperlipidemia, unspecified: Secondary | ICD-10-CM | POA: Diagnosis not present

## 2020-03-18 DIAGNOSIS — I2510 Atherosclerotic heart disease of native coronary artery without angina pectoris: Secondary | ICD-10-CM | POA: Diagnosis not present

## 2020-03-18 DIAGNOSIS — J45909 Unspecified asthma, uncomplicated: Secondary | ICD-10-CM | POA: Diagnosis not present

## 2020-03-18 DIAGNOSIS — E119 Type 2 diabetes mellitus without complications: Secondary | ICD-10-CM | POA: Diagnosis not present

## 2020-03-18 DIAGNOSIS — E1165 Type 2 diabetes mellitus with hyperglycemia: Secondary | ICD-10-CM | POA: Diagnosis not present

## 2020-03-26 ENCOUNTER — Other Ambulatory Visit: Payer: Self-pay | Admitting: Endocrinology

## 2020-03-31 ENCOUNTER — Other Ambulatory Visit: Payer: Self-pay | Admitting: Endocrinology

## 2020-04-28 ENCOUNTER — Other Ambulatory Visit: Payer: Self-pay | Admitting: Obstetrics and Gynecology

## 2020-04-28 DIAGNOSIS — Z1231 Encounter for screening mammogram for malignant neoplasm of breast: Secondary | ICD-10-CM

## 2020-05-27 ENCOUNTER — Other Ambulatory Visit: Payer: Self-pay | Admitting: Endocrinology

## 2020-06-02 ENCOUNTER — Other Ambulatory Visit (INDEPENDENT_AMBULATORY_CARE_PROVIDER_SITE_OTHER): Payer: Medicare PPO

## 2020-06-02 ENCOUNTER — Other Ambulatory Visit: Payer: Self-pay

## 2020-06-02 DIAGNOSIS — E669 Obesity, unspecified: Secondary | ICD-10-CM | POA: Diagnosis not present

## 2020-06-02 DIAGNOSIS — E1169 Type 2 diabetes mellitus with other specified complication: Secondary | ICD-10-CM

## 2020-06-02 LAB — BASIC METABOLIC PANEL
BUN: 26 mg/dL — ABNORMAL HIGH (ref 6–23)
CO2: 30 mEq/L (ref 19–32)
Calcium: 9.8 mg/dL (ref 8.4–10.5)
Chloride: 102 mEq/L (ref 96–112)
Creatinine, Ser: 1.6 mg/dL — ABNORMAL HIGH (ref 0.40–1.20)
GFR: 33.36 mL/min — ABNORMAL LOW (ref >60.00)
Glucose, Bld: 92 mg/dL (ref 70–99)
Potassium: 4.5 mEq/L (ref 3.5–5.1)
Sodium: 140 mEq/L (ref 135–145)

## 2020-06-02 LAB — MICROALBUMIN / CREATININE URINE RATIO
Creatinine,U: 109.9 mg/dL
Microalb Creat Ratio: 43.8 mg/g — ABNORMAL HIGH (ref 0.0–30.0)
Microalb, Ur: 48.1 mg/dL — ABNORMAL HIGH (ref 0.0–1.9)

## 2020-06-02 LAB — HEMOGLOBIN A1C: Hgb A1c MFr Bld: 6.7 % — ABNORMAL HIGH (ref 4.6–6.5)

## 2020-06-04 ENCOUNTER — Ambulatory Visit (INDEPENDENT_AMBULATORY_CARE_PROVIDER_SITE_OTHER): Payer: Medicare PPO | Admitting: Endocrinology

## 2020-06-04 ENCOUNTER — Other Ambulatory Visit: Payer: Self-pay

## 2020-06-04 ENCOUNTER — Encounter: Payer: Self-pay | Admitting: Endocrinology

## 2020-06-04 VITALS — BP 150/90 | HR 87 | Ht 63.0 in | Wt 245.8 lb

## 2020-06-04 DIAGNOSIS — E782 Mixed hyperlipidemia: Secondary | ICD-10-CM

## 2020-06-04 DIAGNOSIS — Z794 Long term (current) use of insulin: Secondary | ICD-10-CM

## 2020-06-04 DIAGNOSIS — E1165 Type 2 diabetes mellitus with hyperglycemia: Secondary | ICD-10-CM | POA: Diagnosis not present

## 2020-06-04 DIAGNOSIS — R809 Proteinuria, unspecified: Secondary | ICD-10-CM

## 2020-06-04 DIAGNOSIS — E1129 Type 2 diabetes mellitus with other diabetic kidney complication: Secondary | ICD-10-CM

## 2020-06-04 MED ORDER — OZEMPIC (1 MG/DOSE) 2 MG/1.5ML ~~LOC~~ SOPN: 1.0000 mg | PEN_INJECTOR | SUBCUTANEOUS | 2 refills | Status: DC

## 2020-06-04 NOTE — Progress Notes (Signed)
Patient ID: Stephanie [name], female   DOB: 10-Jun-1955, 65 y.o.   MRN: [id number]           Reason for Appointment: Followup of diabetes and hypertension  Referring physician: Catha Gosselin  History of Present Illness:          Diagnosis: Type 2 diabetes mellitus, date of diagnosis: 2003       Past history:  She was initially treated metformin at some point glimepiride also added and not clear what her level of control was in the first few years. Had been only taking 500 mg twice a day of metformin and Amaryl increased to 4 mg twice a day several years ago In the last few years her blood sugar control has been more difficult and she has been managed by an endocrinologist Because of poor control she had been tried on Janumet and Kombiglyze but she states that she could not tolerate them because of headaches Had poor control with Amaryl and metformin and also difficulty with weight gain she had started Trulicity in 11/2013 on her initial consultation Her weight on her initial visit was 240 pounds. Her A1c had gone down to 7.1% in 12/15 with using Trulicity which she was tolerating at that time However in 2/16 she stopped taking Trulicity as it was causing nausea , subsequently did not want to go on Tanzeum Because of markedly increased sugars she has been on premixed insulin since 4/16  Recent history:   INSULIN DOSE: NovoLog mix 70/30, 10 before breakfast and 40 units before supper  Tresiba 24 units daily  Oral hypoglycemic drugs the patient is taking are: Metformin 1 g once a day, Ozempic 0.5 mg weekly   A1c is slightly higher at 6.7 compared to 6.3  Current diabetes history, management, problems and blood sugar patterns:   She has had some high readings late at night and this may be from eating more sweets or snacks after dinner  Still not motivated to exercise as recommended on each visit  Weight is now started going up, previously was doing well with gradual weight loss  She  has not had any nausea from Ozempic and takes this regularly  She does admit that occasionally she will forget her evening insulin and then will take it later at night  Blood sugars are the lowest before dinnertime without hypoglycemia even with reducing her morning insulin to 10 units from 14   Side effects from medications have been: Januvia, Onglyza apparently caused headaches, Invokana: Candidiasis   Compliance with the medical regimen: Fair    Dinner is usually at 7 pm  Glucose monitoring:  done 1 times a day or more       Glucometer:  Accu-Chek guide .      Blood Glucose readings by download with average readings:   PRE-MEAL Fasting Lunch Dinner Bedtime Overall  Glucose range: 92-183   81-149  141 -223   Mean/median:  134   123  165 141   Previously:  PRE-MEAL Fasting Lunch Dinner Bedtime Overall  Glucose range:      72-227  Mean/median:  112   119   120   POST-MEAL PC Breakfast PC Lunch PC Dinner  Glucose range:     Mean/median:    137   Glycemic control:   Lab Results  Component Value Date   HGBA1C 6.7 (H) 06/02/2020   HGBA1C 6.3 02/05/2020   HGBA1C 7.4 (A) 10/15/2019   Lab Results  Component Value Date  MICROALBUR 48.1 (H) 06/02/2020   LDLCALC 108 (H) 02/05/2020   CREATININE 1.60 (H) 06/02/2020    Self-care: The diet that the patient has been following is: tries to limit fats usually.      Meals: 2-3 meals per day. Breakfast is cereal or scrambled eggs/sausage.   Lunch is half sandwich, yogurt and fruit; dinner usually baked chicken, bread and vegetables, snacks are fruits or nuts. Not eating out regularly  Dietician visit: Most recent: 06/2015 CDE visit: 12/15              Weight history: Previous range 200-260  Wt Readings from Last 3 Encounters:  06/04/20 245 lb 12.8 oz (111.5 kg)  03/03/20 241 lb 3.2 oz (109.4 kg)  12/04/19 246 lb 6.4 oz (111.8 kg)    Other active problems: discussed in review of systems   LABS:  Lab on 06/02/2020   Component Date Value Ref Range Status  . Microalb, Ur 06/02/2020 48.1* 0.0 - 1.9 mg/dL Final  . Creatinine,U 06/02/2020 109.9  mg/dL Final  . Microalb Creat Ratio 06/02/2020 43.8* 0.0 - 30.0 mg/g Final  . Sodium 06/02/2020 140  135 - 145 mEq/L Final  . Potassium 06/02/2020 4.5  3.5 - 5.1 mEq/L Final  . Chloride 06/02/2020 102  96 - 112 mEq/L Final  . CO2 06/02/2020 30  19 - 32 mEq/L Final  . Glucose, Bld 06/02/2020 92  70 - 99 mg/dL Final  . BUN 06/02/2020 26* 6 - 23 mg/dL Final  . Creatinine, Ser 06/02/2020 1.60* 0.40 - 1.20 mg/dL Final  . GFR 06/02/2020 33.36* >60.00 mL/min Final   Calculated using the CKD-EPI Creatinine Equation (2021)  . Calcium 06/02/2020 9.8  8.4 - 10.5 mg/dL Final  . Hgb A1c MFr Bld 06/02/2020 6.7* 4.6 - 6.5 % Final   Glycemic Control Guidelines for People with Diabetes:Non Diabetic:  <6%Goal of Therapy: <7%Additional Action Suggested:  >8%      Allergies as of 06/04/2020      Reactions   Avapro [irbesartan] Other (See Comments)   headaches   Codeine Nausea And Vomiting   Lisinopril Itching      Medication List       Accurate as of Jun 04, 2020  1:51 PM. If you have any questions, ask your nurse or doctor.        Accu-Chek FastClix Lancets Misc Use accu chek fastclix lancets to check blood sugar 2-3 times daily. DX:E11.65   Accu-Chek Guide Me w/Device Kit 1 each by Does not apply route 3 (three) times daily. Use accu chek guide me to check blood sugar 2-3 times daily. DX:E11.65   Accu-Chek Guide test strip Generic drug: glucose blood USE 1 STRIP TO CHECK GLUCOSE 2 TO 3 TIMES DAILY   allopurinol 100 MG tablet Commonly known as: ZYLOPRIM Take 2 tablets (200 mg total) by mouth daily. For high uric acid   diclofenac Sodium 1 % Gel Commonly known as: Voltaren Apply 2 g topically 4 (four) times daily.   furosemide 40 MG tablet Commonly known as: LASIX Take 1 tablet by mouth once daily   hydrocortisone valerate cream 0.2 % Commonly known as:  WESTCORT   labetalol 100 MG tablet Commonly known as: NORMODYNE Take 1 tablet by mouth twice daily   metFORMIN 1000 MG tablet Commonly known as: GLUCOPHAGE Take 1 tablet (1,000 mg total) by mouth daily with breakfast. Patient is taking 1 a day   NovoLOG Mix 70/30 FlexPen (70-30) 100 UNIT/ML FlexPen Generic drug: insulin aspart protamine -  aspart 10 units before breakfast and 48 units before dinner   Ozempic (0.25 or 0.5 MG/DOSE) 2 MG/1.5ML Sopn Generic drug: Semaglutide(0.25 or 0.5MG /DOS) INJECT 0.5 MG INTO THE SKIN ONCE A WEEK   potassium chloride 10 MEQ tablet Commonly known as: KLOR-CON Take 1 tablet by mouth once daily   ReliOn Pen Needles 31G X 6 MM Misc Generic drug: Insulin Pen Needle USE PEN NEEDLES TO INJECT INSULIN THREE TIMES DAILY   rosuvastatin 20 MG tablet Commonly known as: CRESTOR Take 1 tablet (20 mg total) by mouth daily.   Tyler Aas FlexTouch 100 UNIT/ML FlexTouch Pen Generic drug: insulin degludec INJECT 24 UNITS SUBCUTANEOUSLY ONCE DAILY   valsartan 160 MG tablet Commonly known as: DIOVAN Take 1 tablet by mouth once daily       Allergies:  Allergies  Allergen Reactions  . Avapro [Irbesartan] Other (See Comments)    headaches  . Codeine Nausea And Vomiting  . Lisinopril Itching    Past Medical History:  Diagnosis Date  . Abnormal Pap smear   . Asthma   . Diabetes mellitus   . Hyperlipidemia   . Hypertension   . LGSIL (low grade squamous intraepithelial dysplasia)     Past Surgical History:  Procedure Laterality Date  . ABDOMINAL HYSTERECTOMY    . bladder surgery    . COLPOSCOPY      Family History  Problem Relation Age of Onset  . Diabetes Mother   . Diabetes Maternal Grandmother   . Heart disease Neg Hx   . Hypertension Neg Hx     Social History:  reports that she has never smoked. She has never used smokeless tobacco. She reports that she does not drink alcohol and does not use drugs.    Review of Systems     HYPERURICEMIA:  Uric acid has been significantly high in the past and she was given allopurinol since she was likely having gout previously Prescribed allopurinol 200 mg by the nephrologist  Lab Results  Component Value Date   LABURIC 7.2 (H) 11/26/2018     Hypertension:   She is taking valsartan 160 mg, half tablet and labetalol 135/80  She is followed by nephrologist also   BP Readings from Last 3 Encounters:  06/04/20 (!) 150/90  03/03/20 136/84  12/04/19 134/84   Renal function has been variable as below She has been seen by a nephrologist   On Lasix 40 for edema with good control; is taking potassium supplements  Lab Results  Component Value Date   CREATININE 1.60 (H) 06/02/2020   CREATININE 1.66 (H) 02/05/2020   CREATININE 1.71 (H) 09/18/2019   Lab Results  Component Value Date   K 4.5 06/02/2020         Lipids:    LDL has been previously high, around 180 baseline She has been regular with her Crestor and her LDL is recently below 100       Lab Results  Component Value Date   CHOL 170 02/05/2020   CHOL 163 04/29/2019   CHOL 170 01/04/2019   Lab Results  Component Value Date   HDL 43.10 02/05/2020   HDL 46.70 04/29/2019   HDL 24.20 (L) 01/04/2019   Lab Results  Component Value Date   LDLCALC 108 (H) 02/05/2020   LDLCALC 95 04/29/2019   LDLCALC 114 (H) 01/04/2019   Lab Results  Component Value Date   TRIG 95.0 02/05/2020   TRIG 106.0 04/29/2019   TRIG 160.0 (H) 01/04/2019   Lab Results  Component Value Date   CHOLHDL 4 02/05/2020   CHOLHDL 3 04/29/2019   CHOLHDL 7 01/04/2019   Lab Results  Component Value Date   LDLDIRECT 89.0 12/05/2016   LDLDIRECT 183.0 10/01/2015                 Diabetic foot exam last done in 05/5972 by PCP   Complications from diabetes: Proliferative retinopathy, microalbuminuria    Physical Examination:  BP (!) 150/90 (BP Location: Left Arm, Patient Position: Sitting, Cuff Size: Large)    Pulse 87   Ht $R'5\' 3"'xj$  (1.6 m)   Wt 245 lb 12.8 oz (111.5 kg)   SpO2 94%   BMI 43.54 kg/m       ASSESSMENT:  Diabetes type 2, with obesity, insulin requiring  See history of present illness for detailed discussion of his current management, blood sugar patterns and problems identified  Her A1c is 6.7  She is on premixed insulin twice a day, Tresiba, Ozempic and low-dose Metformin  Her blood sugars are overall controlled although periodically higher at night after dinner especially when she is getting more snacks or sweets She may do better with monitoring with freestyle libre but she is reluctant to do this now Since she is starting to gain weight she may also benefit from increasing her Ozempic She will go up to 1 mg weekly on Ozempic No change in insulin at this time Emphasized the need to exercise regularly to help her weight come down and also potentially reduce insulin doses  Follow-up in 3 months  Hypertension: Blood pressure is higher and since she has some microalbuminuria we will increase her valsartan 260 mg  On Lasix for edema   RENAL dysfunction: Stable, will also be seeing a nephrologist  Follow-up in 3 months   There are no Patient Instructions on file for this visit.     Elayne Snare 06/04/2020, 1:51 PM   Note: This office note was prepared with Dragon voice recognition system technology. Any transcriptional errors that result from this process are unintentional.

## 2020-06-04 NOTE — Patient Instructions (Addendum)
Daily walking  Check blood sugars on waking up 3-4 days a week  Also check blood sugars about 2 hours after meals and do this after different meals by rotation  Recommended blood sugar levels on waking up are 90-130 and about 2 hours after meal is 130-180  Please bring your blood sugar monitor to each visit, thank you  VALSARTAN '160MG'$  one daily

## 2020-06-18 ENCOUNTER — Other Ambulatory Visit: Payer: Self-pay

## 2020-06-18 ENCOUNTER — Ambulatory Visit
Admission: RE | Admit: 2020-06-18 | Discharge: 2020-06-18 | Disposition: A | Discharge status: Home / Self Care | Payer: Medicare PPO | Source: Ambulatory Visit | Attending: Obstetrics and Gynecology | Admitting: Obstetrics and Gynecology

## 2020-06-18 DIAGNOSIS — Z1231 Encounter for screening mammogram for malignant neoplasm of breast: Secondary | ICD-10-CM

## 2020-06-26 ENCOUNTER — Other Ambulatory Visit: Payer: Self-pay | Admitting: Endocrinology

## 2020-06-29 ENCOUNTER — Telehealth: Payer: Self-pay | Admitting: Endocrinology

## 2020-06-29 NOTE — Telephone Encounter (Signed)
Message left for patient to return my call.  

## 2020-06-29 NOTE — Telephone Encounter (Signed)
Pt calling in stating that she is needing to know what the dose is regarding her ozempic pt would like a call back as soon as possible.

## 2020-07-01 NOTE — Telephone Encounter (Signed)
Confirm with patient that she is supposed to inject 1 mg per week not 5 mg

## 2020-07-09 ENCOUNTER — Other Ambulatory Visit: Payer: Self-pay

## 2020-07-09 DIAGNOSIS — E1165 Type 2 diabetes mellitus with hyperglycemia: Secondary | ICD-10-CM

## 2020-07-09 MED ORDER — NOVOLOG MIX 70/30 FLEXPEN (70-30) 100 UNIT/ML ~~LOC~~ SUPN: PEN_INJECTOR | SUBCUTANEOUS | 1 refills | Status: DC

## 2020-07-09 NOTE — Telephone Encounter (Signed)
Inbound fax from [place] requesting a refill for Novolog.

## 2020-07-24 ENCOUNTER — Other Ambulatory Visit: Payer: Self-pay | Admitting: Endocrinology

## 2020-08-08 ENCOUNTER — Other Ambulatory Visit: Payer: Self-pay | Admitting: Endocrinology

## 2020-08-22 ENCOUNTER — Other Ambulatory Visit: Payer: Self-pay | Admitting: Endocrinology

## 2020-09-10 ENCOUNTER — Other Ambulatory Visit (INDEPENDENT_AMBULATORY_CARE_PROVIDER_SITE_OTHER): Payer: Medicare PPO

## 2020-09-10 ENCOUNTER — Other Ambulatory Visit: Payer: Self-pay

## 2020-09-10 DIAGNOSIS — E782 Mixed hyperlipidemia: Secondary | ICD-10-CM

## 2020-09-10 DIAGNOSIS — E1165 Type 2 diabetes mellitus with hyperglycemia: Secondary | ICD-10-CM | POA: Diagnosis not present

## 2020-09-10 DIAGNOSIS — Z794 Long term (current) use of insulin: Secondary | ICD-10-CM

## 2020-09-10 LAB — COMPREHENSIVE METABOLIC PANEL
ALT: 15 U/L (ref 0–35)
AST: 17 U/L (ref 0–37)
Albumin: 4.1 g/dL (ref 3.5–5.2)
Alkaline Phosphatase: 53 U/L (ref 39–117)
BUN: 49 mg/dL — ABNORMAL HIGH (ref 6–23)
CO2: 26 mEq/L (ref 19–32)
Calcium: 9.9 mg/dL (ref 8.4–10.5)
Chloride: 104 mEq/L (ref 96–112)
Creatinine, Ser: 1.97 mg/dL — ABNORMAL HIGH (ref 0.40–1.20)
GFR: 25.94 mL/min — ABNORMAL LOW (ref >60.00)
Glucose, Bld: 128 mg/dL — ABNORMAL HIGH (ref 70–99)
Potassium: 4.8 mEq/L (ref 3.5–5.1)
Sodium: 138 mEq/L (ref 135–145)
Total Bilirubin: 0.5 mg/dL (ref 0.2–1.2)
Total Protein: 8.1 g/dL (ref 6.0–8.3)

## 2020-09-10 LAB — LIPID PANEL
Cholesterol: 163 mg/dL (ref 0–200)
HDL: 43 mg/dL (ref >39.00)
LDL Cholesterol: 94 mg/dL (ref 0–99)
NonHDL: 120.04
Total CHOL/HDL Ratio: 4
Triglycerides: 131 mg/dL (ref 0.0–149.0)
VLDL: 26.2 mg/dL (ref 0.0–40.0)

## 2020-09-10 LAB — HEMOGLOBIN A1C: Hgb A1c MFr Bld: 6.5 % (ref 4.6–6.5)

## 2020-09-15 ENCOUNTER — Ambulatory Visit (INDEPENDENT_AMBULATORY_CARE_PROVIDER_SITE_OTHER): Payer: Medicare PPO | Admitting: Endocrinology

## 2020-09-15 ENCOUNTER — Other Ambulatory Visit: Payer: Self-pay

## 2020-09-15 ENCOUNTER — Ambulatory Visit: Payer: Medicare PPO | Admitting: Endocrinology

## 2020-09-15 ENCOUNTER — Encounter: Payer: Self-pay | Admitting: Endocrinology

## 2020-09-15 VITALS — BP 150/82 | HR 92 | Ht 63.0 in | Wt 245.0 lb

## 2020-09-15 DIAGNOSIS — E1129 Type 2 diabetes mellitus with other diabetic kidney complication: Secondary | ICD-10-CM | POA: Diagnosis not present

## 2020-09-15 DIAGNOSIS — E782 Mixed hyperlipidemia: Secondary | ICD-10-CM | POA: Diagnosis not present

## 2020-09-15 DIAGNOSIS — Z794 Long term (current) use of insulin: Secondary | ICD-10-CM

## 2020-09-15 DIAGNOSIS — I10 Essential (primary) hypertension: Secondary | ICD-10-CM

## 2020-09-15 DIAGNOSIS — E1165 Type 2 diabetes mellitus with hyperglycemia: Secondary | ICD-10-CM

## 2020-09-15 DIAGNOSIS — R809 Proteinuria, unspecified: Secondary | ICD-10-CM

## 2020-09-15 MED ORDER — OZEMPIC (0.25 OR 0.5 MG/DOSE) 2 MG/1.5ML ~~LOC~~ SOPN: 0.5000 mg | PEN_INJECTOR | SUBCUTANEOUS | 2 refills | Status: DC

## 2020-09-15 NOTE — Patient Instructions (Signed)
Walk daily  Check blood sugars on waking up 4 days a week  Also check blood sugars about 2 hours after meals and do this after different meals by rotation  Recommended blood sugar levels on waking up are 90-130 and about 2 hours after meal is 130-160  Please bring your blood sugar monitor to each visit, thank you

## 2020-09-15 NOTE — Progress Notes (Signed)
Patient ID: Stephanie [name], female   DOB: 08-12-1955, 65 y.o.   MRN: [id number]           Reason for Appointment: Followup of diabetes and hypertension  Referring physician: Hulan Fess  History of Present Illness:          Diagnosis: Type 2 diabetes mellitus, date of diagnosis: 2003       Past history:  She was initially treated metformin at some point glimepiride also added and not clear what her level of control was in the first few years. Had been only taking 500 mg twice a day of metformin and Amaryl increased to 4 mg twice a day several years ago In the last few years her blood sugar control has been more difficult and she has been managed by an endocrinologist Because of poor control she had been tried on Janumet and Vilas but she states that she could not tolerate them because of headaches Had poor control with Amaryl and metformin and also difficulty with weight gain she had started Trulicity in 43/1540 on her initial consultation Her weight on her initial visit was 240 pounds. Her A1c had gone down to 7.1% in 08/67 with using Trulicity which she was tolerating at that time However in 2/16 she stopped taking Trulicity as it was causing nausea , subsequently did not want to go on Tanzeum Because of markedly increased sugars she has been on premixed insulin since 4/16  Recent history:   INSULIN DOSE: NovoLog mix 70/30, 10 before breakfast and 40 units before supper  Tresiba 24 units daily  Oral hypoglycemic drugs the patient is taking are: Metformin 1 g once a day, Ozempic 0.5 mg weekly   A1c is slightly better at 6.5  Current diabetes history, management, problems and blood sugar patterns:  She has still not checked any blood sugars after meals and only rarely in the evening  Also not clear if her meter has the right time programmed on it as she has several blood sugars recorded between 12 AM-4 AM  She is somewhat confused about her Ozempic and she thinks she was  getting the 0.5 mg prescription filled instead of the 1 mg that was prescribed.  Also does not know how much she is actually taking  She also said that she sometimes does not get her prescription filled consistently on time for no specific reason  She has not take any Ozempic on the last 2 due dates; however not clear if her blood sugars are recently higher Again not motivated to do any exercise or walking, she said that she is waiting for her hernia surgery  Her weight is about the same  She is trying to take her premixed insulin more consistently before meals although not clear if she may forget sometimes in the evenings   Side effects from medications have been: Januvia, Onglyza apparently caused headaches, Invokana: Candidiasis   Compliance with the medical regimen: Fair    Dinner is usually at 7 pm  Glucose monitoring:  done 1 times a day or more       Glucometer:  Accu-Chek guide .      Blood Glucose readings by download with average readings:   PRE-MEAL Mornings Lunch Dinner Bedtime Overall  Glucose range:    148 78-215  Mean/median: 134 138 118  135   POST-MEAL PC Breakfast PC Lunch PC Dinner  Glucose range:     Mean/median:   136   Previous readings:  PRE-MEAL Fasting  Lunch Dinner Bedtime Overall  Glucose range: 92-183   81-149  141 -223   Mean/median:  134   123  165 141     Glycemic control:   Lab Results  Component Value Date   HGBA1C 6.5 09/10/2020   HGBA1C 6.7 (H) 06/02/2020   HGBA1C 6.3 02/05/2020   Lab Results  Component Value Date   MICROALBUR 48.1 (H) 06/02/2020   LDLCALC 94 09/10/2020   CREATININE 1.97 (H) 09/10/2020    Self-care: The diet that the patient has been following is: tries to limit fats usually.      Meals: 2-3 meals per day. Breakfast is cereal or scrambled eggs/sausage.   Lunch is half sandwich, yogurt and fruit; dinner usually baked chicken, bread and vegetables, snacks are fruits or nuts. Not eating out regularly  Dietician  visit: Most recent: 06/2015 CDE visit: 12/15              Weight history: Previous range 200-260  Wt Readings from Last 3 Encounters:  09/15/20 245 lb (111.1 kg)  06/04/20 245 lb 12.8 oz (111.5 kg)  03/03/20 241 lb 3.2 oz (109.4 kg)    Other active problems: discussed in review of systems   LABS:  Lab on 09/10/2020  Component Date Value Ref Range Status   Sodium 09/10/2020 138  135 - 145 mEq/L Final   Potassium 09/10/2020 4.8  3.5 - 5.1 mEq/L Final   Chloride 09/10/2020 104  96 - 112 mEq/L Final   CO2 09/10/2020 26  19 - 32 mEq/L Final   Glucose, Bld 09/10/2020 128 (A) 70 - 99 mg/dL Final   BUN 09/10/2020 49 (A) 6 - 23 mg/dL Final   Creatinine, Ser 09/10/2020 1.97 (A) 0.40 - 1.20 mg/dL Final   Total Bilirubin 09/10/2020 0.5  0.2 - 1.2 mg/dL Final   Alkaline Phosphatase 09/10/2020 53  39 - 117 U/L Final   AST 09/10/2020 17  0 - 37 U/L Final   ALT 09/10/2020 15  0 - 35 U/L Final   Total Protein 09/10/2020 8.1  6.0 - 8.3 g/dL Final   Albumin 09/10/2020 4.1  3.5 - 5.2 g/dL Final   GFR 09/10/2020 25.94 (A) >60.00 mL/min Final   Calculated using the CKD-EPI Creatinine Equation (2021)   Calcium 09/10/2020 9.9  8.4 - 10.5 mg/dL Final   Cholesterol 09/10/2020 163  0 - 200 mg/dL Final   ATP III Classification       Desirable:  < 200 mg/dL               Borderline High:  200 - 239 mg/dL          High:  > = 240 mg/dL   Triglycerides 09/10/2020 131.0  0.0 - 149.0 mg/dL Final   Normal:  <150 mg/dLBorderline High:  150 - 199 mg/dL   HDL 09/10/2020 43.00  >39.00 mg/dL Final   VLDL 09/10/2020 26.2  0.0 - 40.0 mg/dL Final   LDL Cholesterol 09/10/2020 94  0 - 99 mg/dL Final   Total CHOL/HDL Ratio 09/10/2020 4   Final                  Men          Women1/2 Average Risk     3.4          3.3Average Risk          5.0          4.42X Average Risk  9.6          7.13X Average Risk          15.0          11.0                       NonHDL 09/10/2020 120.04   Final   NOTE:  Non-HDL goal should  be 30 mg/dL higher than patient's LDL goal (i.e. LDL goal of < 70 mg/dL, would have non-HDL goal of < 100 mg/dL)   Hgb A1c MFr Bld 09/10/2020 6.5  4.6 - 6.5 % Final   Glycemic Control Guidelines for People with Diabetes:Non Diabetic:  <6%Goal of Therapy: <7%Additional Action Suggested:  >8%      Allergies as of 09/15/2020       Reactions   Avapro [irbesartan] Other (See Comments)   headaches   Codeine Nausea And Vomiting   Lisinopril Itching        Medication List        Accurate as of September 15, 2020 11:18 AM. If you have any questions, ask your nurse or doctor.          Accu-Chek FastClix Lancets Misc Use accu chek fastclix lancets to check blood sugar 2-3 times daily. DX:E11.65   Accu-Chek Guide Me w/Device Kit 1 each by Does not apply route 3 (three) times daily. Use accu chek guide me to check blood sugar 2-3 times daily. DX:E11.65   Accu-Chek Guide test strip Generic drug: glucose blood USE 1 STRIP TO CHECK GLUCOSE 2 TO 3 TIMES DAILY   allopurinol 100 MG tablet Commonly known as: ZYLOPRIM Take 2 tablets (200 mg total) by mouth daily. For high uric acid   diclofenac Sodium 1 % Gel Commonly known as: Voltaren Apply 2 g topically 4 (four) times daily.   furosemide 40 MG tablet Commonly known as: LASIX Take 1 tablet by mouth once daily   hydrocortisone valerate cream 0.2 % Commonly known as: WESTCORT   labetalol 100 MG tablet Commonly known as: NORMODYNE Take 1 tablet by mouth twice daily   metFORMIN 1000 MG tablet Commonly known as: GLUCOPHAGE Take 1 tablet (1,000 mg total) by mouth daily with breakfast. Patient is taking 1 a day   NovoLOG Mix 70/30 FlexPen (70-30) 100 UNIT/ML FlexPen Generic drug: insulin aspart protamine - aspart 10 units before breakfast and 48 units before dinner   Ozempic (1 MG/DOSE) 2 MG/1.5ML Sopn Generic drug: Semaglutide (1 MG/DOSE) Inject 1 mg into the skin once a week.   potassium chloride 10 MEQ tablet Commonly known  as: KLOR-CON Take 1 tablet by mouth once daily   ReliOn Pen Needles 31G X 6 MM Misc Generic drug: Insulin Pen Needle USE TO INJECT INSULIN THREE TIMES A DAY   rosuvastatin 20 MG tablet Commonly known as: CRESTOR Take 1 tablet (20 mg total) by mouth daily.   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 She has never used smokeless tobacco. She reports that she does not drink alcohol and does not use drugs.    Review of Systems    HYPERURICEMIA:  Uric acid has been significantly high in the past and she was given allopurinol since she was likely having gout previously Prescribed allopurinol 200 mg by the nephrologist  Lab Results  Component Value Date   LABURIC 7.2 (H) 11/26/2018     Hypertension:   She is taking valsartan 160 mg, half tablet and labetalol Home BP recently 138/?  She is followed by nephrologist also She has not taken her medication this morning  BP Readings from Last 3 Encounters:  09/15/20 (!) 150/82  06/04/20 (!) 150/90  03/03/20 136/84   Renal function has been variable as below She has been seen by  nephrologist  more regularly, creatinine about 7 weeks ago was 1.68  On Lasix 40 for edema with good control; is taking potassium supplements  Lab Results  Component Value Date   CREATININE 1.97 (H) 09/10/2020   CREATININE 1.60 (H) 06/02/2020   CREATININE 1.66 (H) 02/05/2020   Lab Results  Component Value Date   K 4.8 09/10/2020        Hypercholesterolemia:    LDL has been previously high, around 180 baseline She has been regular with her Crestor and her LDL is below 100       Lab Results  Component Value Date   CHOL 163 09/10/2020   CHOL 170 02/05/2020   CHOL 163 04/29/2019   Lab Results  Component Value Date   HDL 43.00 09/10/2020   HDL 43.10 02/05/2020   HDL 46.70 04/29/2019   Lab Results  Component Value Date   LDLCALC 94 09/10/2020   LDLCALC 108 (H) 02/05/2020   LDLCALC 95 04/29/2019   Lab Results  Component Value Date   TRIG 131.0 09/10/2020   TRIG 95.0 02/05/2020   TRIG 106.0 04/29/2019   Lab Results  Component Value Date   CHOLHDL 4 09/10/2020   CHOLHDL 4 02/05/2020   CHOLHDL 3 04/29/2019   Lab Results  Component Value Date   LDLDIRECT 89.0 12/05/2016   LDLDIRECT 183.0 10/01/2015                Diabetic foot exam last done in 03/5007 by PCP   Complications from diabetes: Proliferative retinopathy, microalbuminuria    Physical Examination:  BP (!) 150/82   Pulse 92   Ht _0  (1.6 m)   Wt 245 lb (111.1 kg)   SpO2 97%   BMI 43.40 kg/m     1+ ankle edema present  ASSESSMENT:  Diabetes type 2, with obesity, insulin requiring  See history of present illness for detailed discussion of his current management, blood sugar patterns and problems identified  Her A1c is 6.5  She is on premixed insulin twice a day, Tresiba, Ozempic and low-dose Metformin  Her blood sugars are improving  However blood sugar monitoring is incomplete with mostly checking readings in the mornings at various times  Also the time on her monitor is likely not accurate   She was told to increase her Ozempic previously because of weight gain but likely she has not used the 1 mg prescription that was sent  Plan:  She needs to exercise regularly and can do either walking outdoors or with her videotaping inside Check blood sugars by rotation at different times Consider using freestyle libre on the next visit No change in insulin as yet We will start her back on 0.5 mg Ozempic but consider increasing the dose if she has any weight gain on the next visit Discussed importance of and benefits of GLP-1 drugs long-term Discussed blood sugar targets before and after meals  Follow-up in 3 months  Hypertension: Blood pressure is variably high although not consistently with the nephrologist and at home She does need to keep a record of her blood pressure readings and bring the records to [place] and the nephrologist  Continue Lasix for edema   RENAL dysfunction: Slightly higher creatinine recently and will need to continue follow-up  Follow-up in 3 months   There are no Patient Instructions on file for this visit.     Elayne Snare 09/15/2020, 11:18 AM   Note: This office note was prepared with Dragon voice recognition system technology. Any transcriptional errors that result from this process are unintentional.

## 2020-09-16 ENCOUNTER — Ambulatory Visit: Payer: Medicare PPO | Attending: Internal Medicine

## 2020-09-16 DIAGNOSIS — Z23 Encounter for immunization: Secondary | ICD-10-CM

## 2020-09-16 NOTE — Progress Notes (Signed)
   Covid-19 Vaccination [facility]  Name:  Stephanie [name]    MRN: [id number] DOB: 06-16-1955  09/16/2020  Stephanie [name] was observed post Covid-19 immunization for 15 minutes without incident. She was provided with Vaccine Information Sheet and instruction to access the V-Safe system.   Stephanie [name] was instructed to call 911 with any severe reactions post vaccine: Difficulty breathing  Swelling of face and throat  A fast heartbeat  A bad rash all over body  Dizziness and weakness   Immunizations Administered     Name Date Dose VIS Date Route   PFIZER Comrnaty(Gray TOP) Covid-19 Vaccine 09/16/2020  2:40 PM 0.3 mL 01/02/2020 Intramuscular   Manufacturer: [place]   Lot: [id number]   [place]: [phone]

## 2020-09-28 ENCOUNTER — Other Ambulatory Visit: Payer: Self-pay

## 2020-09-28 ENCOUNTER — Emergency Department (HOSPITAL_COMMUNITY)
Admission: EM | Admit: 2020-09-28 | Discharge: 2020-09-29 | Disposition: A | Discharge status: Home / Self Care | Payer: Medicare PPO | Attending: Emergency Medicine | Admitting: Emergency Medicine

## 2020-09-28 DIAGNOSIS — K469 Unspecified abdominal hernia without obstruction or gangrene: Secondary | ICD-10-CM | POA: Diagnosis not present

## 2020-09-28 DIAGNOSIS — R109 Unspecified abdominal pain: Secondary | ICD-10-CM | POA: Diagnosis present

## 2020-09-28 DIAGNOSIS — Z79899 Other long term (current) drug therapy: Secondary | ICD-10-CM | POA: Insufficient documentation

## 2020-09-28 DIAGNOSIS — J45909 Unspecified asthma, uncomplicated: Secondary | ICD-10-CM | POA: Insufficient documentation

## 2020-09-28 DIAGNOSIS — E1129 Type 2 diabetes mellitus with other diabetic kidney complication: Secondary | ICD-10-CM | POA: Insufficient documentation

## 2020-09-28 DIAGNOSIS — I10 Essential (primary) hypertension: Secondary | ICD-10-CM | POA: Insufficient documentation

## 2020-09-28 DIAGNOSIS — Z794 Long term (current) use of insulin: Secondary | ICD-10-CM | POA: Diagnosis not present

## 2020-09-28 DIAGNOSIS — Z7984 Long term (current) use of oral hypoglycemic drugs: Secondary | ICD-10-CM | POA: Diagnosis not present

## 2020-09-28 DIAGNOSIS — E11319 Type 2 diabetes mellitus with unspecified diabetic retinopathy without macular edema: Secondary | ICD-10-CM | POA: Diagnosis not present

## 2020-09-28 NOTE — ED Triage Notes (Signed)
Pt c/o pain from a hernia in her abdomen.

## 2020-09-29 ENCOUNTER — Emergency Department (HOSPITAL_COMMUNITY): Payer: Medicare PPO

## 2020-09-29 LAB — CBC WITH DIFFERENTIAL/PLATELET
Abs Immature Granulocytes: 0.05 10*3/uL (ref 0.00–0.07)
Basophils Absolute: 0 10*3/uL (ref 0.0–0.1)
Basophils Relative: 0 %
Eosinophils Absolute: 0.1 10*3/uL (ref 0.0–0.5)
Eosinophils Relative: 1 %
HCT: 35.2 % — ABNORMAL LOW (ref 36.0–46.0)
Hemoglobin: 11.4 g/dL — ABNORMAL LOW (ref 12.0–15.0)
Immature Granulocytes: 1 %
Lymphocytes Relative: 25 %
Lymphs Abs: 2.6 10*3/uL (ref 0.7–4.0)
MCH: 30.2 pg (ref 26.0–34.0)
MCHC: 32.4 g/dL (ref 30.0–36.0)
MCV: 93.4 fL (ref 80.0–100.0)
Monocytes Absolute: 0.7 10*3/uL (ref 0.1–1.0)
Monocytes Relative: 7 %
Neutro Abs: 6.7 10*3/uL (ref 1.7–7.7)
Neutrophils Relative %: 66 %
Platelets: 232 10*3/uL (ref 150–400)
RBC: 3.77 MIL/uL — ABNORMAL LOW (ref 3.87–5.11)
RDW: 13.5 % (ref 11.5–15.5)
WBC: 10.2 10*3/uL (ref 4.0–10.5)
nRBC: 0 % (ref 0.0–0.2)

## 2020-09-29 LAB — BASIC METABOLIC PANEL
Anion gap: 9 (ref 5–15)
BUN: 26 mg/dL — ABNORMAL HIGH (ref 8–23)
CO2: 25 mmol/L (ref 22–32)
Calcium: 9.4 mg/dL (ref 8.9–10.3)
Chloride: 102 mmol/L (ref 98–111)
Creatinine, Ser: 1.58 mg/dL — ABNORMAL HIGH (ref 0.44–1.00)
GFR, Estimated: 36 mL/min — ABNORMAL LOW (ref >60)
Glucose, Bld: 181 mg/dL — ABNORMAL HIGH (ref 70–99)
Potassium: 4.5 mmol/L (ref 3.5–5.1)
Sodium: 136 mmol/L (ref 135–145)

## 2020-09-29 MED ORDER — FENTANYL CITRATE PF 50 MCG/ML IJ SOSY
50.0000 ug | PREFILLED_SYRINGE | Freq: Once | INTRAMUSCULAR | Status: DC
  Filled 2020-09-29: qty 1

## 2020-09-29 MED ORDER — IOHEXOL 350 MG/ML SOLN
75.0000 mL | Freq: Once | INTRAVENOUS | Status: AC | PRN
  Administered 2020-09-29: 75 mL via INTRAVENOUS

## 2020-09-29 MED ORDER — ACETAMINOPHEN 500 MG PO TABS
1000.0000 mg | ORAL_TABLET | Freq: Once | ORAL | Status: AC
  Administered 2020-09-29: 1000 mg via ORAL
  Filled 2020-09-29: qty 2

## 2020-09-29 NOTE — ED Provider Notes (Signed)
[facility] EMERGENCY DEPARTMENT Provider Note   CSN: [id number] Arrival date & time: 09/28/20  2232     History Chief Complaint  Patient presents with   Hernia    Stephanie [name] is a 65 y.o. female.  65 year old female who has a long history of an umbilical hernia that presents emerged from today secondary to worsening pain.  Patient states that she has had this for at least 6 years but it was generally the size of a plum.  Her last couple years is gotten to be the size of a grapefruit and then today she started having pain in the area.  She did have a bowel movement this morning.  She 1 episode of vomiting but this was directly related to her gag reflex when she is brushing her teeth.  No fevers.  No other associated symptoms.  Has never seen a Psychologist, sport and exercise for it.       Past Medical History:  Diagnosis Date   Abnormal Pap smear    Asthma    Diabetes mellitus    Hyperlipidemia    Hypertension    LGSIL (low grade squamous intraepithelial dysplasia)     Patient Active Problem List   Diagnosis Date Noted   Uncontrolled type 2 diabetes mellitus with hyperglycemia, with long-term current use of insulin ([place]) 12/07/2016   Diabetes type 2, uncontrolled ([place]) 10/28/2013   DM retinopathy ([place]) 09/13/2013   Type II diabetes mellitus with renal manifestations ([place]) 09/13/2013   Essential hypertension, benign 09/13/2013   Hyperlipidemia associated with type 2 diabetes mellitus ([place]) 09/13/2013   LGSIL (low grade squamous intraepithelial dysplasia)     Past Surgical History:  Procedure Laterality Date   ABDOMINAL HYSTERECTOMY     bladder surgery     COLPOSCOPY       OB History     Gravida  2   Para  2   Term      Preterm      AB      Living  2      SAB      IAB      Ectopic      Multiple      Live Births              Family History  Problem Relation Age of Onset   Diabetes Mother    Diabetes Maternal Grandmother    Heart disease  Neg Hx    Hypertension Neg Hx     Social History   Tobacco Use   Smoking status: Never   Smokeless tobacco: Never  Substance Use Topics   Alcohol use: No   Drug use: No    Home Medications Prior to Admission medications   Medication Sig Start Date End Date Taking? Authorizing Provider  Accu-Chek FastClix Lancets MISC Use accu chek fastclix lancets to check blood sugar 2-3 times daily. DX:E11.65 01/30/19  Yes Elayne Snare, MD  ACCU-CHEK GUIDE test strip USE 1 STRIP TO CHECK GLUCOSE 2 TO 3 TIMES DAILY 06/26/20  Yes Elayne Snare, MD  allopurinol (ZYLOPRIM) 100 MG tablet Take 2 tablets (200 mg total) by mouth daily. For high uric acid Patient taking differently: Take 100 mg by mouth daily. For high uric acid 12/04/19  Yes Elayne Snare, MD  Blood Glucose Monitoring Suppl (ACCU-CHEK GUIDE ME) w/Device KIT 1 each by Does not apply route 3 (three) times daily. Use accu chek guide me to check blood sugar 2-3 times daily. DX:E11.65 01/30/19  Yes  Elayne Snare, MD  Ergocalciferol (VITAMIN D2 PO) Take 1 tablet by mouth daily.   Yes [provider]  ferrous sulfate 325 (65 FE) MG tablet Take 325 mg by mouth daily with breakfast.   Yes [provider]  furosemide (LASIX) 40 MG tablet Take 1 tablet by mouth once daily Patient taking differently: Take 40 mg by mouth daily. 08/24/20  Yes Elayne Snare, MD  hydrocortisone valerate cream (WESTCORT) 0.2 % Apply 1 application topically daily as needed (rash). 06/28/13  Yes [provider]  Insulin Pen Needle (RELION PEN NEEDLES) 31G X 6 MM MISC USE TO INJECT INSULIN THREE TIMES A DAY 08/10/20  Yes Elayne Snare, MD  labetalol (NORMODYNE) 100 MG tablet Take 1 tablet by mouth twice daily Patient taking differently: Take 100 mg by mouth 2 (two) times daily. 08/24/20  Yes Elayne Snare, MD  metFORMIN (GLUCOPHAGE) 1000 MG tablet Take 1 tablet (1,000 mg total) by mouth daily with breakfast. Patient is taking 1 a day Patient taking differently: Take 1,000 mg  by mouth daily after supper. 02/04/20  Yes Elayne Snare, MD  Multiple Vitamins-Minerals (ZINC PO) Take 1 tablet by mouth daily.   Yes [provider]  NOVOLOG MIX 70/30 FLEXPEN (70-30) 100 UNIT/ML FlexPen 10 units before breakfast and 48 units before dinner Patient taking differently: Inject 10-40 Units into the skin See admin instructions. 10 units before breakfast and 40 units before dinner 07/09/20  Yes Elayne Snare, MD  potassium chloride (KLOR-CON) 10 MEQ tablet Take 1 tablet by mouth once daily Patient taking differently: Take 10 mEq by mouth daily. 07/24/20  Yes Elayne Snare, MD  rosuvastatin (CRESTOR) 20 MG tablet Take 1 tablet (20 mg total) by mouth daily. 12/04/19  Yes Elayne Snare, MD  Semaglutide,0.25 or 0.5MG /DOS, (OZEMPIC, 0.25 OR 0.5 MG/DOSE,) 2 MG/1.5ML SOPN Inject 0.5 mg into the skin once a week. Patient taking differently: Inject 0.25 mg into the skin every Monday. 09/15/20  Yes Elayne Snare, MD  TRESIBA FLEXTOUCH 100 UNIT/ML FlexTouch Pen INJECT 24 UNITS SUBCUTANEOUSLY ONCE DAILY Patient taking differently: Inject 24 Units into the skin daily. 07/24/20  Yes Elayne Snare, MD  valsartan (DIOVAN) 160 MG tablet Take 1 tablet by mouth once daily Patient taking differently: Take 160 mg by mouth daily. 08/24/20  Yes Elayne Snare, MD  vitamin B-12 (CYANOCOBALAMIN) 1000 MCG tablet Take 1,000 mcg by mouth daily.   Yes [provider]  vitamin C (ASCORBIC ACID) 500 MG tablet Take 500 mg by mouth daily.   Yes [provider]  VITAMIN E-400 PO Take 400 mg by mouth daily.   Yes [provider]  diclofenac Sodium (VOLTAREN) 1 % GEL Apply 2 g topically 4 (four) times daily. Patient not taking: No sig reported 09/18/19   Caccavale, Sophia, PA-C    Allergies    Avapro [irbesartan], Codeine, and Lisinopril  Review of Systems   Review of Systems  All other systems reviewed and are negative.  Physical Exam Updated Vital Signs BP (!) 152/68 (BP Location: Left Arm)    Pulse 79   Temp 97.9 F (36.6 C) (Oral)   Resp 15   Ht 5\' 3"  (1.6 m)   Wt 108.9 kg   SpO2 97%   BMI 42.51 kg/m   Physical Exam Vitals and nursing note reviewed.  Constitutional:      Appearance: She is well-developed.  HENT:     Head: Normocephalic and atraumatic.     Mouth/Throat:     Mouth: Mucous membranes  are moist.     Pharynx: Oropharynx is clear.  Cardiovascular:     Rate and Rhythm: Normal rate and regular rhythm.  Pulmonary:     Effort: Pulmonary effort is normal. No respiratory distress.     Breath sounds: No stridor.  Abdominal:     General: There is no distension.     Hernia: A hernia (large right sided umbilical hernia. soft, not tender, no overlying erythema) is present.  Musculoskeletal:        General: Normal range of motion.     Cervical back: Normal range of motion.  Skin:    General: Skin is warm and dry.  Neurological:     General: No focal deficit present.     Mental Status: She is alert.    ED Results / Procedures / Treatments   Labs (all labs ordered are listed, but only abnormal results are displayed) Labs Reviewed  CBC WITH DIFFERENTIAL/PLATELET - Abnormal; Notable for the following components:      Result Value   RBC 3.77 (*)    Hemoglobin 11.4 (*)    HCT 35.2 (*)    All other components within normal limits  BASIC METABOLIC PANEL - Abnormal; Notable for the following components:   Glucose, Bld 181 (*)    BUN 26 (*)    Creatinine, Ser 1.58 (*)    GFR, Estimated 36 (*)    All other components within normal limits  URINALYSIS, ROUTINE W REFLEX MICROSCOPIC    EKG None  Radiology CT ABDOMEN PELVIS W CONTRAST  Result Date: 09/29/2020 CLINICAL DATA:  Abdominal abscess/infection suspected. Abdominal pain EXAM: CT ABDOMEN AND PELVIS WITH CONTRAST TECHNIQUE: Multidetector CT imaging of the abdomen and pelvis was performed using the standard protocol following bolus administration of intravenous contrast. CONTRAST:  71m OMNIPAQUE IOHEXOL  350 MG/ML SOLN COMPARISON:  05/18/2011 FINDINGS: Lower chest: No acute abnormality. Hepatobiliary: No focal hepatic abnormality. Gallbladder unremarkable. Pancreas: No focal abnormality or ductal dilatation. Spleen: No focal abnormality.  Normal size. Adrenals/Urinary Tract: 3 cm cyst in the upper pole of the left kidney. No stones or hydronephrosis. Adrenal glands and urinary bladder unremarkable. Stomach/Bowel: Colonic diverticulosis. No active diverticulitis. Transverse colon is within a large ventral hernia. No evidence of bowel obstruction. There is mild wall thickening seen within right lower quadrant small bowel loops. There is edema within the mesentery to the small bowel loops. Vascular/Lymphatic: No evidence of aneurysm or adenopathy. Reproductive: Prior hysterectomy.  No adnexal masses. Other: No free fluid or free air. Musculoskeletal: No acute bony abnormality. IMPRESSION: Prominent small bowel loops in the right lower quadrant with wall thickening and mesenteric edema/fluid adjacent to the small bowel loops. Appearance could reflect enteritis. The edema within the mesentery is concerning for possible closed loop obstruction. Large ventral wall hernia containing transverse colon. No bowel obstruction related to the hernia. Diffuse colonic diverticulosis. Electronically Signed   By: KRolm BaptiseM.D.   On: 09/29/2020 02:39    Procedures Procedures   Medications Ordered in ED Medications  fentaNYL (SUBLIMAZE) injection 50 mcg (50 mcg Intravenous Patient Refused/Not Given 09/29/20 0151)  acetaminophen (TYLENOL) tablet 1,000 mg (1,000 mg Oral Given 09/29/20 0150)  iohexol (OMNIPAQUE) 350 MG/ML injection 75 mL (75 mLs Intravenous Contrast Given 09/29/20 0225)    ED Course  I have reviewed the triage vital signs and the nursing notes.  Pertinent labs & imaging results that were available during my care of the patient were reviewed by me and considered in my medical decision  making (see chart for  details).    MDM Rules/Calculators/A&P                         Evaluate for complications from hernia. Appears well overall.  CT scan showed hernia stable questionable enteritis versus bowel obstruction in the right lower quadrant.  Patient was given food here.  She is passing gas.  Not vomiting. Will dc to fu w/ surgery.   Final Clinical Impression(s) / ED Diagnoses Final diagnoses:  Hernia of abdominal cavity    Rx / DC Orders ED Discharge Orders     None        Icker Swigert, Corene Cornea, MD 09/29/20 [phone]

## 2020-09-29 NOTE — ED Notes (Signed)
Patient given [place] sandwich and sprite

## 2020-10-01 ENCOUNTER — Other Ambulatory Visit: Payer: Self-pay | Admitting: Endocrinology

## 2020-10-05 ENCOUNTER — Other Ambulatory Visit (HOSPITAL_BASED_OUTPATIENT_CLINIC_OR_DEPARTMENT_OTHER): Payer: Self-pay

## 2020-10-05 MED ORDER — COVID-19 MRNA VAC-TRIS(PFIZER) 30 MCG/0.3ML IM SUSP
INTRAMUSCULAR | 0 refills | Status: DC
  Filled 2020-10-05: qty 0.3, 1d supply, fill #0

## 2020-10-07 ENCOUNTER — Other Ambulatory Visit: Payer: Self-pay | Admitting: Surgery

## 2020-10-14 ENCOUNTER — Other Ambulatory Visit: Payer: Self-pay | Admitting: Endocrinology

## 2020-10-20 ENCOUNTER — Other Ambulatory Visit: Payer: Self-pay | Admitting: Endocrinology

## 2020-10-22 ENCOUNTER — Encounter (HOSPITAL_COMMUNITY): Payer: Self-pay | Admitting: Emergency Medicine

## 2020-10-22 ENCOUNTER — Inpatient Hospital Stay (HOSPITAL_COMMUNITY)
Admission: EM | Admit: 2020-10-22 | Discharge: 2020-10-31 | DRG: 330 | Disposition: A | Discharge status: Home / Self Care | Payer: Medicare PPO | Attending: General Surgery | Admitting: General Surgery

## 2020-10-22 ENCOUNTER — Other Ambulatory Visit: Payer: Self-pay

## 2020-10-22 ENCOUNTER — Emergency Department (HOSPITAL_COMMUNITY): Payer: Medicare PPO

## 2020-10-22 DIAGNOSIS — Z6841 Body Mass Index (BMI) 40.0 and over, adult: Secondary | ICD-10-CM | POA: Diagnosis not present

## 2020-10-22 DIAGNOSIS — Z833 Family history of diabetes mellitus: Secondary | ICD-10-CM | POA: Diagnosis not present

## 2020-10-22 DIAGNOSIS — I10 Essential (primary) hypertension: Secondary | ICD-10-CM | POA: Diagnosis present

## 2020-10-22 DIAGNOSIS — R519 Headache, unspecified: Secondary | ICD-10-CM | POA: Diagnosis not present

## 2020-10-22 DIAGNOSIS — N179 Acute kidney failure, unspecified: Secondary | ICD-10-CM | POA: Diagnosis present

## 2020-10-22 DIAGNOSIS — K436 Other and unspecified ventral hernia with obstruction, without gangrene: Secondary | ICD-10-CM | POA: Diagnosis present

## 2020-10-22 DIAGNOSIS — K460 Unspecified abdominal hernia with obstruction, without gangrene: Secondary | ICD-10-CM

## 2020-10-22 DIAGNOSIS — Z794 Long term (current) use of insulin: Secondary | ICD-10-CM | POA: Diagnosis not present

## 2020-10-22 DIAGNOSIS — E785 Hyperlipidemia, unspecified: Secondary | ICD-10-CM | POA: Diagnosis present

## 2020-10-22 DIAGNOSIS — Z7984 Long term (current) use of oral hypoglycemic drugs: Secondary | ICD-10-CM

## 2020-10-22 DIAGNOSIS — Z20822 Contact with and (suspected) exposure to covid-19: Secondary | ICD-10-CM | POA: Diagnosis present

## 2020-10-22 DIAGNOSIS — Z888 Allergy status to other drugs, medicaments and biological substances status: Secondary | ICD-10-CM | POA: Diagnosis not present

## 2020-10-22 DIAGNOSIS — E119 Type 2 diabetes mellitus without complications: Secondary | ICD-10-CM | POA: Diagnosis present

## 2020-10-22 DIAGNOSIS — Z885 Allergy status to narcotic agent status: Secondary | ICD-10-CM | POA: Diagnosis not present

## 2020-10-22 DIAGNOSIS — K430 Incisional hernia with obstruction, without gangrene: Principal | ICD-10-CM | POA: Diagnosis present

## 2020-10-22 DIAGNOSIS — K567 Ileus, unspecified: Secondary | ICD-10-CM | POA: Diagnosis not present

## 2020-10-22 DIAGNOSIS — Z90711 Acquired absence of uterus with remaining cervical stump: Secondary | ICD-10-CM

## 2020-10-22 DIAGNOSIS — J45909 Unspecified asthma, uncomplicated: Secondary | ICD-10-CM | POA: Diagnosis not present

## 2020-10-22 DIAGNOSIS — Z23 Encounter for immunization: Secondary | ICD-10-CM | POA: Diagnosis not present

## 2020-10-22 DIAGNOSIS — Z4659 Encounter for fitting and adjustment of other gastrointestinal appliance and device: Secondary | ICD-10-CM

## 2020-10-22 DIAGNOSIS — K432 Incisional hernia without obstruction or gangrene: Secondary | ICD-10-CM | POA: Diagnosis present

## 2020-10-22 LAB — RESP PANEL BY RT-PCR (FLU A&B, COVID) ARPGX2
Influenza A by PCR: NEGATIVE
Influenza B by PCR: NEGATIVE
SARS Coronavirus 2 by RT PCR: NEGATIVE

## 2020-10-22 LAB — COMPREHENSIVE METABOLIC PANEL
ALT: 19 U/L (ref 0–44)
AST: 17 U/L (ref 15–41)
Albumin: 3.7 g/dL (ref 3.5–5.0)
Alkaline Phosphatase: 52 U/L (ref 38–126)
Anion gap: 12 (ref 5–15)
BUN: 34 mg/dL — ABNORMAL HIGH (ref 8–23)
CO2: 27 mmol/L (ref 22–32)
Calcium: 9.9 mg/dL (ref 8.9–10.3)
Chloride: 98 mmol/L (ref 98–111)
Creatinine, Ser: 1.98 mg/dL — ABNORMAL HIGH (ref 0.44–1.00)
GFR, Estimated: 27 mL/min — ABNORMAL LOW (ref >60)
Glucose, Bld: 142 mg/dL — ABNORMAL HIGH (ref 70–99)
Potassium: 5.1 mmol/L (ref 3.5–5.1)
Sodium: 137 mmol/L (ref 135–145)
Total Bilirubin: 1 mg/dL (ref 0.3–1.2)
Total Protein: 7.7 g/dL (ref 6.5–8.1)

## 2020-10-22 LAB — CBC WITH DIFFERENTIAL/PLATELET
Abs Immature Granulocytes: 0.02 10*3/uL (ref 0.00–0.07)
Basophils Absolute: 0 10*3/uL (ref 0.0–0.1)
Basophils Relative: 0 %
Eosinophils Absolute: 0.2 10*3/uL (ref 0.0–0.5)
Eosinophils Relative: 2 %
HCT: 35.8 % — ABNORMAL LOW (ref 36.0–46.0)
Hemoglobin: 11.3 g/dL — ABNORMAL LOW (ref 12.0–15.0)
Immature Granulocytes: 0 %
Lymphocytes Relative: 33 %
Lymphs Abs: 2.8 10*3/uL (ref 0.7–4.0)
MCH: 29.7 pg (ref 26.0–34.0)
MCHC: 31.6 g/dL (ref 30.0–36.0)
MCV: 94.2 fL (ref 80.0–100.0)
Monocytes Absolute: 0.7 10*3/uL (ref 0.1–1.0)
Monocytes Relative: 9 %
Neutro Abs: 4.7 10*3/uL (ref 1.7–7.7)
Neutrophils Relative %: 56 %
Platelets: 270 10*3/uL (ref 150–400)
RBC: 3.8 MIL/uL — ABNORMAL LOW (ref 3.87–5.11)
RDW: 13.4 % (ref 11.5–15.5)
WBC: 8.5 10*3/uL (ref 4.0–10.5)
nRBC: 0 % (ref 0.0–0.2)

## 2020-10-22 LAB — CBG MONITORING, ED
Glucose-Capillary: 90 mg/dL (ref 70–99)
Glucose-Capillary: 109 mg/dL — ABNORMAL HIGH (ref 70–99)

## 2020-10-22 LAB — LIPASE, BLOOD: Lipase: 48 U/L (ref 11–51)

## 2020-10-22 MED ORDER — DIPHENHYDRAMINE HCL 12.5 MG/5ML PO ELIX
12.5000 mg | ORAL_SOLUTION | Freq: Four times a day (QID) | ORAL | Status: DC | PRN
  Administered 2020-10-27: 12.5 mg via ORAL
  Filled 2020-10-22 (×3): qty 5

## 2020-10-22 MED ORDER — ACETAMINOPHEN 325 MG PO TABS
650.0000 mg | ORAL_TABLET | Freq: Four times a day (QID) | ORAL | Status: DC | PRN
  Filled 2020-10-22: qty 2

## 2020-10-22 MED ORDER — HEPARIN SODIUM (PORCINE) 5000 UNIT/ML IJ SOLN
5000.0000 [IU] | Freq: Three times a day (TID) | INTRAMUSCULAR | Status: DC
  Administered 2020-10-22 – 2020-10-31 (×26): 5000 [IU] via SUBCUTANEOUS
  Filled 2020-10-22 (×26): qty 1

## 2020-10-22 MED ORDER — DIPHENHYDRAMINE HCL 50 MG/ML IJ SOLN
12.5000 mg | Freq: Four times a day (QID) | INTRAMUSCULAR | Status: DC | PRN
  Administered 2020-10-29: 12.5 mg via INTRAVENOUS
  Filled 2020-10-22: qty 1

## 2020-10-22 MED ORDER — OXYCODONE HCL 5 MG PO TABS
5.0000 mg | ORAL_TABLET | ORAL | Status: DC | PRN
  Administered 2020-10-24: 5 mg via ORAL
  Filled 2020-10-22: qty 1

## 2020-10-22 MED ORDER — SODIUM CHLORIDE 0.9 % IV SOLN: INTRAVENOUS | Status: DC

## 2020-10-22 MED ORDER — INSULIN ASPART 100 UNIT/ML IJ SOLN
0.0000 [IU] | INTRAMUSCULAR | Status: DC
Start: 2020-10-22 — End: 2020-10-29
  Administered 2020-10-23: 4 [IU] via SUBCUTANEOUS
  Administered 2020-10-24 (×2): 3 [IU] via SUBCUTANEOUS
  Administered 2020-10-24 (×2): 4 [IU] via SUBCUTANEOUS
  Administered 2020-10-24 – 2020-10-27 (×11): 3 [IU] via SUBCUTANEOUS
  Administered 2020-10-28: 4 [IU] via SUBCUTANEOUS
  Administered 2020-10-28 (×3): 3 [IU] via SUBCUTANEOUS
  Administered 2020-10-28: 4 [IU] via SUBCUTANEOUS
  Administered 2020-10-29 (×2): 3 [IU] via SUBCUTANEOUS

## 2020-10-22 MED ORDER — SODIUM CHLORIDE 0.9 % IV BOLUS
1000.0000 mL | Freq: Once | INTRAVENOUS | Status: AC
  Administered 2020-10-22: 1000 mL via INTRAVENOUS

## 2020-10-22 MED ORDER — INSULIN DETEMIR 100 UNIT/ML ~~LOC~~ SOLN
12.0000 [IU] | Freq: Every day | SUBCUTANEOUS | Status: DC
  Administered 2020-10-24 – 2020-10-31 (×8): 12 [IU] via SUBCUTANEOUS
  Filled 2020-10-22 (×11): qty 0.12

## 2020-10-22 MED ORDER — HYDROMORPHONE HCL 1 MG/ML IJ SOLN
0.5000 mg | INTRAMUSCULAR | Status: DC | PRN
  Administered 2020-10-22 – 2020-10-27 (×4): 0.5 mg via INTRAVENOUS
  Filled 2020-10-22: qty 1
  Filled 2020-10-22 (×2): qty 0.5

## 2020-10-22 MED ORDER — LABETALOL HCL 100 MG PO TABS
100.0000 mg | ORAL_TABLET | Freq: Two times a day (BID) | ORAL | Status: DC
  Administered 2020-10-22 – 2020-10-31 (×18): 100 mg via ORAL
  Filled 2020-10-22 (×17): qty 1

## 2020-10-22 MED ORDER — ONDANSETRON HCL 4 MG/2ML IJ SOLN
4.0000 mg | Freq: Four times a day (QID) | INTRAMUSCULAR | Status: DC | PRN
  Administered 2020-10-23: 4 mg via INTRAVENOUS

## 2020-10-22 MED ORDER — ONDANSETRON 4 MG PO TBDP: 4.0000 mg | ORAL_TABLET | Freq: Four times a day (QID) | ORAL | Status: DC | PRN

## 2020-10-22 MED ORDER — ACETAMINOPHEN 650 MG RE SUPP: 650.0000 mg | Freq: Four times a day (QID) | RECTAL | Status: DC | PRN

## 2020-10-22 MED ORDER — ACETAMINOPHEN 325 MG PO TABS
650.0000 mg | ORAL_TABLET | Freq: Once | ORAL | Status: AC
Start: 2020-10-22 — End: 2020-10-22
  Administered 2020-10-22: 650 mg via ORAL
  Filled 2020-10-22: qty 2

## 2020-10-22 NOTE — ED Triage Notes (Signed)
Pt endorses abd pain from her hernia since yesterday. Denies n/v/d.

## 2020-10-22 NOTE — ED Provider Notes (Signed)
[facility] EMERGENCY DEPARTMENT Provider Note   CSN: [id number] Arrival date & time: 10/22/20  1133     History Chief Complaint  Patient presents with   Abdominal Pain    Stephanie [name] is a 65 y.o. female.  With past medical history of hypertension, hyperlipidemia, diabetes, known ventral hernia who presents emergency department with abdominal pain.  States that she has had abdominal ventral hernia for 6 years.  She states that it is gotten bigger over the past few months.  States that beginning yesterday she was having pain over the hernia that she describes as dull, but increasing pain with sharpness with movement or coughing.  States that the pain is only better when she sits still.  She endorses that the skin over the hernia became warm and red yesterday.  Endorses associated mild nausea without vomiting.  Last bowel movement 2 days ago.  Endorses passing flatus.  Denies diarrhea, chest pain, shortness of breath, dysuria or vaginal discharge denies fevers.  Denies history of strangulation.  Denies previous abdominal surgeries.  She states that she had a partial hysterectomy many years ago however said that surgical approach was vaginal.  Of note she was seen in the emergency department on 09/28/2020 for worsening pain of her hernia with vomiting.  She had CT which showed prominent small bowel loops in the right lower quadrant with wall thickening and mesenteric edema/fluid adjacent to the small bowel loops.  Thought that it could reflect enteritis.  There was no strangulation of the transverse colon on that imaging.  She was able to tolerate p.o. and was discharged with surgery follow-up.  She saw Dr. Ninfa Linden with [facility] surgery on 10/07/2020.  She is scheduled for surgery on 11/06/2020 for open incisional hernia repair with mesh.   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[place]) 09/13/2013   Type II diabetes mellitus with renal manifestations ([place]) 09/13/2013   Essential hypertension, benign 09/13/2013   Hyperlipidemia associated with type 2 diabetes mellitus ([place]) 09/13/2013   LGSIL (low grade squamous intraepithelial dysplasia)     Past Surgical History:  Procedure Laterality Date   ABDOMINAL HYSTERECTOMY     bladder surgery     COLPOSCOPY       OB History     Gravida  2   Para  2   Term      Preterm      AB      Living  2      SAB      IAB      Ectopic      Multiple      Live Births              Family History  Problem Relation Age of Onset   Diabetes Mother    Diabetes Maternal Grandmother    Heart disease Neg Hx  Hypertension Neg Hx     Social History   Tobacco Use   Smoking status: Never   Smokeless tobacco: Never  Substance Use Topics   Alcohol use: No   Drug use: No    Home Medications Prior to Admission medications   Medication Sig Start Date End Date Taking? Authorizing Provider  Accu-Chek FastClix Lancets MISC Use accu chek fastclix lancets to check blood sugar 2-3 times daily. DX:E11.65 01/30/19   Elayne Snare, MD  ACCU-CHEK GUIDE test strip USE 1 STRIP TO CHECK GLUCOSE 2 TO 3 TIMES DAILY 06/26/20   Elayne Snare, MD  allopurinol (ZYLOPRIM) 100 MG tablet Take 2 tablets (200 mg total) by mouth daily. For high uric acid Patient taking differently: Take 100 mg by mouth daily. For high uric acid 12/04/19   Elayne Snare, MD  Blood Glucose  Monitoring Suppl (ACCU-CHEK GUIDE ME) w/Device KIT 1 each by Does not apply route 3 (three) times daily. Use accu chek guide me to check blood sugar 2-3 times daily. DX:E11.65 01/30/19   Elayne Snare, MD  COVID-19 mRNA Vac-TriS, Pfizer, SUSP injection Inject into the muscle. 09/16/20   Carlyle Basques, MD  diclofenac Sodium (VOLTAREN) 1 % GEL Apply 2 g topically 4 (four) times daily. Patient not taking: No sig reported 09/18/19   Caccavale, Sophia, PA-C  Ergocalciferol (VITAMIN D2 PO) Take 1 tablet by mouth daily.    [provider]  ferrous sulfate 325 (65 FE) MG tablet Take 325 mg by mouth daily with breakfast.    [provider]  furosemide (LASIX) 40 MG tablet Take 1 tablet by mouth once daily Patient taking differently: Take 40 mg by mouth daily. 08/24/20   Elayne Snare, MD  hydrocortisone valerate cream (WESTCORT) 0.2 % Apply 1 application topically daily as needed (rash). 06/28/13   [provider]  Insulin Pen Needle (RELION PEN NEEDLES) 31G X 6 MM MISC USE 1  THREE TIMES DAILY 10/02/20   Elayne Snare, MD  labetalol (NORMODYNE) 100 MG tablet Take 1 tablet by mouth twice daily 10/15/20   Elayne Snare, MD  metFORMIN (GLUCOPHAGE) 1000 MG tablet Take 1 tablet (1,000 mg total) by mouth daily with breakfast. Patient is taking 1 a day Patient taking differently: Take 1,000 mg by mouth daily after supper. 02/04/20   Elayne Snare, MD  Multiple Vitamins-Minerals (ZINC PO) Take 1 tablet by mouth daily.    [provider]  NOVOLOG MIX 70/30 FLEXPEN (70-30) 100 UNIT/ML FlexPen 10 units before breakfast and 48 units before dinner Patient taking differently: Inject 10-40 Units into the skin See admin instructions. 10 units before breakfast and 40 units before dinner 07/09/20   Elayne Snare, MD  potassium chloride (KLOR-CON) 10 MEQ tablet Take 1 tablet by mouth once daily Patient taking differently: Take 10 mEq by mouth daily. 07/24/20   Elayne Snare, MD  rosuvastatin (CRESTOR) 20 MG tablet  Take 1 tablet by mouth once daily 10/15/20   Elayne Snare, MD  Semaglutide,0.25 or 0.5MG /DOS, (OZEMPIC, 0.25 OR 0.5 MG/DOSE,) 2 MG/1.5ML SOPN Inject 0.5 mg into the skin once a week. Patient taking differently: Inject 0.25 mg into the skin every Monday. 09/15/20   Elayne Snare, MD  TRESIBA FLEXTOUCH 100 UNIT/ML FlexTouch Pen INJECT 24 UNITS SUBCUTANEOUSLY ONCE DAILY 10/20/20   Elayne Snare, MD  valsartan (DIOVAN) 160 MG tablet Take 1 tablet by mouth once daily Patient taking differently: Take 160 mg by mouth daily. 08/24/20   Elayne Snare, MD  vitamin B-12 (CYANOCOBALAMIN) 1000 MCG tablet  Take 1,000 mcg by mouth daily.    [provider]  vitamin C (ASCORBIC ACID) 500 MG tablet Take 500 mg by mouth daily.    [provider]  VITAMIN E-400 PO Take 400 mg by mouth daily.    [provider]    Allergies    Avapro [irbesartan], Codeine, and Lisinopril  Review of Systems   Review of Systems  Constitutional:  Negative for appetite change and fever.  Respiratory:  Negative for shortness of breath.   Cardiovascular:  Negative for chest pain.  Gastrointestinal:  Positive for abdominal pain and nausea. Negative for diarrhea and vomiting.  Genitourinary:  Negative for dysuria and vaginal discharge.  All other systems reviewed and are negative.  Physical Exam Updated Vital Signs BP 125/63 (BP Location: Right Arm)   Pulse 71   Temp 99.3 F (37.4 C) (Oral)   Resp 16   Ht 5' 3.5" (1.613 m)   Wt 108.9 kg   SpO2 97%   BMI 41.85 kg/m   Physical Exam Vitals and nursing note reviewed.  Constitutional:      General: She is not in acute distress.    Appearance: Normal appearance. She is well-developed. She is obese. She is not toxic-appearing.  HENT:     Head: Normocephalic and atraumatic.     Mouth/Throat:     Mouth: Mucous membranes are moist.     Pharynx: Oropharynx is clear.  Eyes:     General: No scleral icterus.    Extraocular Movements: Extraocular movements intact.      Pupils: Pupils are equal, round, and reactive to light.  Cardiovascular:     Rate and Rhythm: Normal rate and regular rhythm.     Pulses: Normal pulses.     Heart sounds: No murmur heard. Pulmonary:     Effort: Pulmonary effort is normal. No respiratory distress.     Breath sounds: Normal breath sounds.  Abdominal:     General: Abdomen is protuberant. Bowel sounds are normal.     Palpations: Abdomen is soft.     Tenderness: There is abdominal tenderness in the periumbilical area. There is no guarding or rebound. Negative signs include Murphy's sign.     Hernia: A hernia is present. Hernia is present in the ventral area.     Comments: Redness and warmth over ventral hernia  Musculoskeletal:     Cervical back: Normal range of motion.  Skin:    General: Skin is warm and dry.     Capillary Refill: Capillary refill takes less than 2 seconds.     Findings: Erythema present.  Neurological:     General: No focal deficit present.     Mental Status: She is alert and oriented to person, place, and time.  Psychiatric:        Mood and Affect: Mood normal.        Behavior: Behavior normal.   ED Results / Procedures / Treatments   Labs (all labs ordered are listed, but only abnormal results are displayed) Labs Reviewed  CBC WITH DIFFERENTIAL/PLATELET - Abnormal; Notable for the following components:      Result Value   RBC 3.80 (*)    Hemoglobin 11.3 (*)    HCT 35.8 (*)    All other components within normal limits  COMPREHENSIVE METABOLIC PANEL - Abnormal; Notable for the following components:   Glucose, Bld 142 (*)    BUN 34 (*)    Creatinine, Ser 1.98 (*)    GFR, Estimated 27 (*)  All other components within normal limits  LIPASE, BLOOD   EKG None  Radiology CT Abdomen Pelvis Wo Contrast  Result Date: 10/22/2020 CLINICAL DATA:  Abdominal wall hernia EXAM: CT ABDOMEN AND PELVIS WITHOUT CONTRAST TECHNIQUE: Multidetector CT imaging of the abdomen and pelvis was performed  following the standard protocol without IV contrast. Unenhanced CT was performed per clinician order. Lack of IV contrast limits sensitivity and specificity, especially for evaluation of abdominal/pelvic solid viscera. COMPARISON:  09/29/2020 FINDINGS: Lower chest: No acute pleural or parenchymal lung disease. Hepatobiliary: No focal liver abnormality is seen. No gallstones, gallbladder wall thickening, or biliary dilatation. Pancreas: Unremarkable. No pancreatic ductal dilatation or surrounding inflammatory changes. Spleen: Normal in size without focal abnormality. Adrenals/Urinary Tract: No urinary tract calculi or obstructive uropathy. Stable left renal cyst. Bladder is grossly normal. The adrenals are unremarkable. Stomach/Bowel: There is a large umbilical hernia containing multiple loops of large and small bowel. Dilated loop of jejunum extends into the inferior margin of the umbilical hernia, with resulting wall thickening, fat stranding, and small bowel dilation. 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 This has progressed since prior study. Normal appendix. Diverticulosis of the distal colon without diverticulitis. Vascular/Lymphatic: No significant vascular findings are present. No enlarged abdominal or pelvic lymph nodes. Reproductive: Status post hysterectomy. No adnexal masses. Other: Progressive edema at the root of the mesentery. No free fluid or free gas. Large umbilical hernia as above, with abdominal wall defect measuring approximately 5.2 cm in maximal dimension. Musculoskeletal: No acute or destructive bony lesions. Reconstructed images demonstrate no additional findings. IMPRESSION: 1. Dilated thick-walled segment of jejunum which extends into the inferior margin of the known large umbilical hernia, consistent with closed loop obstruction and possible incarcerated hernia. Increased mesenteric edema and bowel wall thickening since prior study. 2.  Distal colonic diverticulosis without diverticulitis. Electronically Signed   By: Randa Ngo M.D.   On: 10/22/2020 18:41    Procedures Procedures   Medications Ordered in ED Medications  acetaminophen (TYLENOL) tablet 650 mg (has no administration in time range)   ED Course  I have reviewed the triage vital signs and the nursing notes.  Pertinent labs & imaging results that were available during my care of the patient were reviewed by me and considered in my medical decision making (see chart for details).    MDM Rules/Calculators/A&P 65 year old female who presents emergency department with complaints of abdominal pain and ventral hernia.  Initial labs without leukocytosis.  No overt signs of strangulation on initial exam.  The patient is without severe pain, nausea or vomiting.  She is hemodynamically stable.  GFR 27 so will obtain CT abdomen pelvis without contrast. CT abdomen pelvis with dilated thick-walled segment of jejunum extending into the inferior margin of umbilical hernia, consistent with closed-loop obstruction and possible incarcerated hernia.  There is increased mesenteric edema and bowel wall thickening since the prior study.  1920: spoke with [facility] [place] [facility] for admission - agree to come see the patient 1945: spoke with Michaelle Birks, MD at bedside. Will admit patient. To keep NPO now.  Final Clinical Impression(s) / ED Diagnoses Final diagnoses:  Incarcerated hernia    Rx / DC Orders ED Discharge Orders     None        Mickie Hillier, PA-C 10/22/20 1946    Horton, Alvin Critchley, DO 10/23/20 2245

## 2020-10-22 NOTE — ED Notes (Signed)
Pt ambulatory to the bathroom without difficulty, placed in hospital bed for comfort.

## 2020-10-22 NOTE — ED Provider Notes (Signed)
Emergency Medicine Provider Triage Evaluation Note  IKRAM WILLHOIT , a 65 y.o. female  was evaluated in triage.  Pt complains of abdominal pain x 2 days. Patient has a large ventral hernia. No history of strangulation. Patient denies fever. Denies nausea, vomiting, diarrhea. Patient has upcoming surgery for hernia on Oct 14. No chest pain, SOB, dysuria.  Review of Systems  Positive: As above Negative: As above  Physical Exam  BP (!) 147/75 (BP Location: Right Arm)   Pulse 89   Temp 99.3 F (37.4 C) (Oral)   Resp 16   Ht 5' 3.5" (1.613 m)   Wt 108.9 kg   SpO2 98%   BMI 41.85 kg/m  Gen:   Awake, no distress   Resp:  Normal effort  MSK:   Moves extremities without difficulty  Other:  Large ventral hernia with some warmth but without signs of strangulation or incarceration at this time  Medical Decision Making  Medically screening exam initiated at 12:01 PM.  Appropriate orders placed.  KESHA MISKA was informed that the remainder of the evaluation will be completed by another provider, this initial triage assessment does not replace that evaluation, and the importance of remaining in the ED until their evaluation is complete.  Hernia pain   Dorien Chihuahua 10/22/20 1204    Gareth Morgan, MD 10/22/20 1254

## 2020-10-22 NOTE — H&P (Addendum)
Stephanie [name] [birth year]  QR:[id number].    Requesting MD: Theodis Blaze Chief Complaint/Reason for Consult: ventral hernia  HPI:  Stephanie [name] is a 65 year old female a longstanding ventral incisional hernia who presented to the ED today with nausea and worsening pain at the hernia.  The hernia has been present for about 6 years, but is enlarged in the last few months and in the last few weeks has caused her more pain.  She was first seen in the ED on 09/28/2020 with pain and vomiting.  A CT scan at that time showed colon within the hernia as well as thickening and edema of adjacent loops of small bowel.  She tolerated p.o. challenge and followed up as an outpatient with Dr. Ninfa Linden on 9/14.  She has been scheduled for elective repair of the hernia on 10/14.  Over the last few days she has had increasing pain over the hernia.  She has been able to eat a little bit yesterday and today, but today has had a lot of nausea.  She denies vomiting.  Her last bowel movement was 2 days ago but she has been passing flatus today.  The pain worsened today and she presented to the ED.  She has been hemodynamically stable.  Her creatinine is mildly elevated above baseline to 1.9, but labs are otherwise unremarkable.  WBC is normal.  CT scan was done and shows the hernia now with a loop of small bowel within the hernia and ongoing adjacent edema.  General surgery was consulted due to concern for obstruction.  ROS: Review of Systems  Constitutional:  Negative for chills and fever.  Eyes:  Negative for redness.  Respiratory:  Negative for shortness of breath, wheezing and stridor.   Cardiovascular:  Negative for chest pain.  Gastrointestinal:  Positive for abdominal pain and nausea. Negative for vomiting.  Skin:  Negative for rash.  Neurological:  Negative for weakness.   Family History  Problem Relation Age of Onset   Diabetes Mother    Diabetes Maternal Grandmother    Heart disease Neg Hx    Hypertension  Neg Hx     Past Medical History:  Diagnosis Date   Abnormal Pap smear    Asthma    Diabetes mellitus    Hyperlipidemia    Hypertension    LGSIL (low grade squamous intraepithelial dysplasia)     Past Surgical History:  Procedure Laterality Date   ABDOMINAL HYSTERECTOMY     bladder surgery     COLPOSCOPY      Social History:  reports that she has never smoked. She has never used smokeless tobacco. She reports that she does not drink alcohol and does not use drugs.  Allergies:  Allergies  Allergen Reactions   Avapro [Irbesartan] Other (See Comments)    headaches   Codeine Nausea And Vomiting   Lisinopril Itching    (Not in a hospital admission)    Physical Exam: Blood pressure (!) 149/68, pulse 69, temperature 99.3 F (37.4 C), temperature source Oral, resp. rate 16, height 5' 3.5" (1.613 m), weight 108.9 kg, SpO2 96 %. General: resting comfortably, appears stated age, no apparent distress Neurological: alert and oriented, no focal deficits, cranial nerves grossly in tact HEENT: normocephalic, atraumatic, no scleral icterus CV: regular rate and rhythm, extremities warm and well-perfused Respiratory: normal work of breathing on room air, symmetric chest wall expansion Abdomen: soft, nondistended, well-healed surgical scar at midline with a large periumbilical hernia. Hernia is not reducible  and is mildly tender to palpation, but soft with no rebound tenderness or guarding. There are no overlying skin changes. Extremities: warm and well-perfused, no deformities, moving all extremities spontaneously Psychiatric: normal mood and affect Skin: warm and dry, no jaundice, no rashes or lesions   Results for orders placed or performed during the hospital encounter of 10/22/20 (from the past 48 hour(s))  CBC with Differential     Status: Abnormal   Collection Time: 10/22/20 12:13 PM  Result Value Ref Range   WBC 8.5 4.0 - 10.5 K/uL   RBC 3.80 (L) 3.87 - 5.11 MIL/uL    Hemoglobin 11.3 (L) 12.0 - 15.0 g/dL   HCT 35.8 (L) 36.0 - 46.0 %   MCV 94.2 80.0 - 100.0 fL   MCH 29.7 26.0 - 34.0 pg   MCHC 31.6 30.0 - 36.0 g/dL   RDW 13.4 11.5 - 15.5 %   Platelets 270 150 - 400 K/uL   nRBC 0.0 0.0 - 0.2 %   Neutrophils Relative % 56 %   Neutro Abs 4.7 1.7 - 7.7 K/uL   Lymphocytes Relative 33 %   Lymphs Abs 2.8 0.7 - 4.0 K/uL   Monocytes Relative 9 %   Monocytes Absolute 0.7 0.1 - 1.0 K/uL   Eosinophils Relative 2 %   Eosinophils Absolute 0.2 0.0 - 0.5 K/uL   Basophils Relative 0 %   Basophils Absolute 0.0 0.0 - 0.1 K/uL   Immature Granulocytes 0 %   Abs Immature Granulocytes 0.02 0.00 - 0.07 K/uL    Comment: Performed at [facility], [address], [place], [place] [address]  Comprehensive metabolic panel     Status: Abnormal   Collection Time: 10/22/20 12:13 PM  Result Value Ref Range   Sodium 137 135 - 145 mmol/L   Potassium 5.1 3.5 - 5.1 mmol/L   Chloride 98 98 - 111 mmol/L   CO2 27 22 - 32 mmol/L   Glucose, Bld 142 (H) 70 - 99 mg/dL    Comment: Glucose reference range applies only to samples taken after fasting for at least 8 hours.   BUN 34 (H) 8 - 23 mg/dL   Creatinine, Ser 1.98 (H) 0.44 - 1.00 mg/dL   Calcium 9.9 8.9 - 10.3 mg/dL   Total Protein 7.7 6.5 - 8.1 g/dL   Albumin 3.7 3.5 - 5.0 g/dL   AST 17 15 - 41 U/L   ALT 19 0 - 44 U/L   Alkaline Phosphatase 52 38 - 126 U/L   Total Bilirubin 1.0 0.3 - 1.2 mg/dL   GFR, Estimated 27 (L) >60 mL/min    Comment: (NOTE) Calculated using the CKD-EPI Creatinine Equation (2021)    Anion gap 12 5 - 15    Comment: Performed at [facility] [address] [place], [place] [address]  Lipase, blood     Status: None   Collection Time: 10/22/20 12:13 PM  Result Value Ref Range   Lipase 48 11 - 51 U/L    Comment: Performed at [facility] [address], [place], [place] [address]   CT Abdomen Pelvis Wo Contrast  Result Date: 10/22/2020 CLINICAL DATA:  Abdominal wall hernia EXAM: CT  ABDOMEN AND PELVIS WITHOUT CONTRAST TECHNIQUE: Multidetector CT imaging of the abdomen and pelvis was performed following the standard protocol without IV contrast. Unenhanced CT was performed per clinician order. Lack of IV contrast limits sensitivity and specificity, especially for evaluation of abdominal/pelvic solid viscera. COMPARISON:  09/29/2020  FINDINGS: Lower chest: No acute pleural or parenchymal lung disease. Hepatobiliary: No focal liver abnormality is seen. No gallstones, gallbladder wall thickening, or biliary dilatation. Pancreas: Unremarkable. No pancreatic ductal dilatation or surrounding inflammatory changes. Spleen: Normal in size without focal abnormality. Adrenals/Urinary Tract: No urinary tract calculi or obstructive uropathy. Stable left renal cyst. Bladder is grossly normal. The adrenals are unremarkable. Stomach/Bowel: There is a large umbilical hernia containing multiple loops of large and small bowel. Dilated loop of jejunum extends into the inferior margin of the umbilical hernia, with resulting wall thickening, fat stranding, and small bowel dilation. 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 This has progressed since prior study. Normal appendix. Diverticulosis of the distal colon without diverticulitis. Vascular/Lymphatic: No significant vascular findings are present. No enlarged abdominal or pelvic lymph nodes. Reproductive: Status post hysterectomy. No adnexal masses. Other: Progressive edema at the root of the mesentery. No free fluid or free gas. Large umbilical hernia as above, with abdominal wall defect measuring approximately 5.2 cm in maximal dimension. Musculoskeletal: No acute or destructive bony lesions. Reconstructed images demonstrate no additional findings. IMPRESSION: 1. Dilated thick-walled segment of jejunum which extends into the inferior margin of the known large umbilical hernia, consistent with closed loop  obstruction and possible incarcerated hernia. Increased mesenteric edema and bowel wall thickening since prior study. 2. Distal colonic diverticulosis without diverticulitis. Electronically Signed   By: Randa Ngo M.D.   On: 10/22/2020 18:41      Assessment/Plan This is a 65 year old female with a chronically incarcerated ventral incisional hernia, presenting with nausea and acutely worsening pain.  I have reviewed her most recent imaging and compared her scans.:  Was present within her hernia on her scan a few weeks ago, however today there appears to be a new loop of small bowel within the hernia.  There is mild adjacent stranding and swelling of the small bowel, which was present to some degree on her prior scan but has increased on today's imaging.  She is clinically well with no signs of sepsis and no peritoneal signs to suggest necrotic bowel, but with these acute changes on her imaging and her pain I think that she would benefit from more more urgent repair. - NPO, IV fluid hydration, will give a 1L bolus for mild AKI. Suspect this is secondary to decreased p.o. intake and dehydration. -Adding scale insulin with basal insulin at half of home dose -Pain and nausea control -Stomach and the majority of the small bowel appear decompressed so will defer NG tube placement for now, unless patient develops vomiting. - VTE: SQH, SCDs - Dispo: admit to observation, tentatively plan for surgery in the morning for hernia repair.  I discussed with the patient that her bowel will be assessed intraoperatively and that she will need adhesiolysis and placement of mesh given the size of the hernia defect[place] [id number] [facility] Surgery General, Hepatobiliary and Pancreatic Surgery 10/22/20 7:56 PM

## 2020-10-23 ENCOUNTER — Observation Stay (HOSPITAL_COMMUNITY): Payer: Medicare PPO | Admitting: Anesthesiology

## 2020-10-23 ENCOUNTER — Encounter (HOSPITAL_COMMUNITY): Payer: Self-pay

## 2020-10-23 ENCOUNTER — Encounter (HOSPITAL_COMMUNITY): Admission: EM | Disposition: A | Discharge status: Home / Self Care | Payer: Self-pay | Source: Home / Self Care

## 2020-10-23 ENCOUNTER — Observation Stay (HOSPITAL_COMMUNITY): Payer: Medicare PPO

## 2020-10-23 HISTORY — PX: LAPAROTOMY: SHX154

## 2020-10-23 HISTORY — PX: INSERTION OF MESH: SHX5868

## 2020-10-23 HISTORY — PX: INCISIONAL HERNIA REPAIR: SHX193

## 2020-10-23 HISTORY — PX: BOWEL RESECTION: SHX1257

## 2020-10-23 LAB — BASIC METABOLIC PANEL
Anion gap: 8 (ref 5–15)
BUN: 29 mg/dL — ABNORMAL HIGH (ref 8–23)
CO2: 25 mmol/L (ref 22–32)
Calcium: 9 mg/dL (ref 8.9–10.3)
Chloride: 105 mmol/L (ref 98–111)
Creatinine, Ser: 1.68 mg/dL — ABNORMAL HIGH (ref 0.44–1.00)
GFR, Estimated: 33 mL/min — ABNORMAL LOW (ref >60)
Glucose, Bld: 116 mg/dL — ABNORMAL HIGH (ref 70–99)
Potassium: 4.2 mmol/L (ref 3.5–5.1)
Sodium: 138 mmol/L (ref 135–145)

## 2020-10-23 LAB — CBC
HCT: 30.8 % — ABNORMAL LOW (ref 36.0–46.0)
Hemoglobin: 9.8 g/dL — ABNORMAL LOW (ref 12.0–15.0)
MCH: 29.8 pg (ref 26.0–34.0)
MCHC: 31.8 g/dL (ref 30.0–36.0)
MCV: 93.6 fL (ref 80.0–100.0)
Platelets: 209 10*3/uL (ref 150–400)
RBC: 3.29 MIL/uL — ABNORMAL LOW (ref 3.87–5.11)
RDW: 13.4 % (ref 11.5–15.5)
WBC: 6.1 10*3/uL (ref 4.0–10.5)
nRBC: 0 % (ref 0.0–0.2)

## 2020-10-23 LAB — GLUCOSE, CAPILLARY
Glucose-Capillary: 170 mg/dL — ABNORMAL HIGH (ref 70–99)
Glucose-Capillary: 164 mg/dL — ABNORMAL HIGH (ref 70–99)

## 2020-10-23 LAB — TYPE AND SCREEN
ABO/RH(D): B POS
Antibody Screen: NEGATIVE

## 2020-10-23 LAB — ABO/RH: ABO/RH(D): B POS

## 2020-10-23 LAB — CBG MONITORING, ED
Glucose-Capillary: 118 mg/dL — ABNORMAL HIGH (ref 70–99)
Glucose-Capillary: 113 mg/dL — ABNORMAL HIGH (ref 70–99)
Glucose-Capillary: 103 mg/dL — ABNORMAL HIGH (ref 70–99)

## 2020-10-23 LAB — HIV ANTIBODY (ROUTINE TESTING W REFLEX): HIV Screen 4th Generation wRfx: NONREACTIVE

## 2020-10-23 SURGERY — REPAIR, HERNIA, INCISIONAL
Anesthesia: General | Site: Abdomen

## 2020-10-23 MED ORDER — HYDROMORPHONE HCL 1 MG/ML IJ SOLN
INTRAMUSCULAR | Status: AC
  Filled 2020-10-23: qty 1

## 2020-10-23 MED ORDER — ROCURONIUM BROMIDE 10 MG/ML (PF) SYRINGE
PREFILLED_SYRINGE | INTRAVENOUS | Status: DC | PRN
  Administered 2020-10-23: 60 mg via INTRAVENOUS
  Administered 2020-10-23: 20 mg via INTRAVENOUS

## 2020-10-23 MED ORDER — SUGAMMADEX SODIUM 200 MG/2ML IV SOLN
INTRAVENOUS | Status: DC | PRN
  Administered 2020-10-23: 400 mg via INTRAVENOUS

## 2020-10-23 MED ORDER — FENTANYL CITRATE (PF) 250 MCG/5ML IJ SOLN
INTRAMUSCULAR | Status: AC
  Filled 2020-10-23: qty 5

## 2020-10-23 MED ORDER — ONDANSETRON HCL 4 MG/2ML IJ SOLN
INTRAMUSCULAR | Status: DC | PRN
  Administered 2020-10-23: 4 mg via INTRAVENOUS

## 2020-10-23 MED ORDER — 0.9 % SODIUM CHLORIDE (POUR BTL) OPTIME
TOPICAL | Status: DC | PRN
  Administered 2020-10-23 (×2): 1000 mL

## 2020-10-23 MED ORDER — OXYCODONE HCL 5 MG/5ML PO SOLN: 5.0000 mg | Freq: Once | ORAL | Status: DC | PRN

## 2020-10-23 MED ORDER — LIDOCAINE 2% (20 MG/ML) 5 ML SYRINGE
INTRAMUSCULAR | Status: DC | PRN
  Administered 2020-10-23: 100 mg via INTRAVENOUS

## 2020-10-23 MED ORDER — FENTANYL CITRATE (PF) 250 MCG/5ML IJ SOLN
INTRAMUSCULAR | Status: DC | PRN
  Administered 2020-10-23: 50 ug via INTRAVENOUS
  Administered 2020-10-23: 100 ug via INTRAVENOUS

## 2020-10-23 MED ORDER — LACTATED RINGERS IV SOLN: INTRAVENOUS | Status: DC

## 2020-10-23 MED ORDER — ONDANSETRON HCL 4 MG/2ML IJ SOLN: 4.0000 mg | Freq: Once | INTRAMUSCULAR | Status: DC | PRN

## 2020-10-23 MED ORDER — FENTANYL CITRATE (PF) 100 MCG/2ML IJ SOLN
25.0000 ug | INTRAMUSCULAR | Status: DC | PRN
  Administered 2020-10-23 (×3): 50 ug via INTRAVENOUS

## 2020-10-23 MED ORDER — CHLORHEXIDINE GLUCONATE CLOTH 2 % EX PADS: 6.0000 | MEDICATED_PAD | Freq: Once | CUTANEOUS | Status: DC

## 2020-10-23 MED ORDER — EPHEDRINE SULFATE 50 MG/ML IJ SOLN
INTRAMUSCULAR | Status: DC | PRN
  Administered 2020-10-23 (×5): 5 mg via INTRAVENOUS

## 2020-10-23 MED ORDER — DEXAMETHASONE SODIUM PHOSPHATE 10 MG/ML IJ SOLN
INTRAMUSCULAR | Status: DC | PRN
  Administered 2020-10-23: 5 mg via INTRAVENOUS

## 2020-10-23 MED ORDER — ACETAMINOPHEN 500 MG PO TABS
1000.0000 mg | ORAL_TABLET | ORAL | Status: AC
  Administered 2020-10-23: 1000 mg via ORAL
  Filled 2020-10-23: qty 2

## 2020-10-23 MED ORDER — SODIUM CHLORIDE 0.9 % IV SOLN: 2.0000 g | INTRAVENOUS | Status: DC

## 2020-10-23 MED ORDER — PHENYLEPHRINE HCL (PRESSORS) 10 MG/ML IV SOLN
INTRAVENOUS | Status: DC | PRN
  Administered 2020-10-23: 160 ug via INTRAVENOUS
  Administered 2020-10-23 (×2): 80 ug via INTRAVENOUS
  Administered 2020-10-23 (×2): 120 ug via INTRAVENOUS

## 2020-10-23 MED ORDER — CHLORHEXIDINE GLUCONATE 0.12 % MT SOLN
OROMUCOSAL | Status: AC
  Administered 2020-10-23: 15 mL via OROMUCOSAL
  Filled 2020-10-23: qty 15

## 2020-10-23 MED ORDER — FENTANYL CITRATE (PF) 100 MCG/2ML IJ SOLN
INTRAMUSCULAR | Status: AC
  Filled 2020-10-23: qty 2

## 2020-10-23 MED ORDER — SODIUM CHLORIDE 0.9 % IV SOLN
INTRAVENOUS | Status: DC | PRN
  Administered 2020-10-23: 30 ug/min via INTRAVENOUS

## 2020-10-23 MED ORDER — ONDANSETRON HCL 4 MG/2ML IJ SOLN
INTRAMUSCULAR | Status: AC
  Filled 2020-10-23: qty 2

## 2020-10-23 MED ORDER — INSULIN ASPART 100 UNIT/ML IJ SOLN
INTRAMUSCULAR | Status: AC
  Filled 2020-10-23: qty 1

## 2020-10-23 MED ORDER — CHLORHEXIDINE GLUCONATE 0.12 % MT SOLN: 15.0000 mL | Freq: Once | OROMUCOSAL | Status: AC

## 2020-10-23 MED ORDER — OXYCODONE HCL 5 MG PO TABS: 5.0000 mg | ORAL_TABLET | Freq: Once | ORAL | Status: DC | PRN

## 2020-10-23 MED ORDER — ORAL CARE MOUTH RINSE: 15.0000 mL | Freq: Once | OROMUCOSAL | Status: AC

## 2020-10-23 MED ORDER — VASOPRESSIN 20 UNIT/ML IV SOLN
INTRAVENOUS | Status: AC
  Filled 2020-10-23: qty 1

## 2020-10-23 MED ORDER — CEFAZOLIN SODIUM-DEXTROSE 2-4 GM/100ML-% IV SOLN
2.0000 g | INTRAVENOUS | Status: AC
  Administered 2020-10-23: 2 g via INTRAVENOUS
  Filled 2020-10-23: qty 100

## 2020-10-23 MED ORDER — PROPOFOL 10 MG/ML IV BOLUS
INTRAVENOUS | Status: DC | PRN
  Administered 2020-10-23: 150 mg via INTRAVENOUS
  Administered 2020-10-23: 50 mg via INTRAVENOUS

## 2020-10-23 MED ORDER — CHLORHEXIDINE GLUCONATE CLOTH 2 % EX PADS
6.0000 | MEDICATED_PAD | Freq: Every day | CUTANEOUS | Status: DC
  Administered 2020-10-24 – 2020-10-31 (×8): 6 via TOPICAL

## 2020-10-23 SURGICAL SUPPLY — 42 items
APL PRP STRL LF DISP 70% ISPRP (MISCELLANEOUS)
BAG COUNTER SPONGE SURGICOUNT (BAG) ×2 IMPLANT
BAG SPNG CNTER NS LX DISP (BAG) ×1
BLADE CLIPPER SURG (BLADE) IMPLANT
CANISTER SUCT 3000ML PPV (MISCELLANEOUS) ×2 IMPLANT
CHLORAPREP W/TINT 26 (MISCELLANEOUS) IMPLANT
COVER SURGICAL LIGHT HANDLE (MISCELLANEOUS) ×2 IMPLANT
DRAPE LAPAROSCOPIC ABDOMINAL (DRAPES) ×2 IMPLANT
DRSG COVADERM 4X8 (GAUZE/BANDAGES/DRESSINGS) ×1 IMPLANT
ELECT CAUTERY BLADE 6.4 (BLADE) ×2 IMPLANT
ELECT REM PT RETURN 9FT ADLT (ELECTROSURGICAL) ×2
ELECTRODE REM PT RTRN 9FT ADLT (ELECTROSURGICAL) ×1 IMPLANT
GLOVE SURG POLYISO LF SZ7 (GLOVE) ×2 IMPLANT
GLOVE SURG UNDER POLY LF SZ7 (GLOVE) ×2 IMPLANT
GOWN STRL REUS W/ TWL LRG LVL3 (GOWN DISPOSABLE) ×2 IMPLANT
GOWN STRL REUS W/TWL LRG LVL3 (GOWN DISPOSABLE) ×4
KIT BASIN OR (CUSTOM PROCEDURE TRAY) ×2 IMPLANT
KIT TURNOVER KIT B (KITS) ×2 IMPLANT
LIGASURE IMPACT 36 18CM CVD LR (INSTRUMENTS) ×1 IMPLANT
MESH PHASIX ST 25X30 (Mesh General) ×1 IMPLANT
NEEDLE 22X1 1/2 (OR ONLY) (NEEDLE) ×1 IMPLANT
NS IRRIG 1000ML POUR BTL (IV SOLUTION) ×2 IMPLANT
PACK GENERAL/GYN (CUSTOM PROCEDURE TRAY) ×2 IMPLANT
PAD ARMBOARD 7.5X6 YLW CONV (MISCELLANEOUS) ×4 IMPLANT
PENCIL SMOKE EVACUATOR (MISCELLANEOUS) ×1 IMPLANT
RELOAD PROXIMATE 75MM BLUE (ENDOMECHANICALS) ×4 IMPLANT
RELOAD STAPLE 75 3.8 BLU REG (ENDOMECHANICALS) IMPLANT
STAPLER GUN LINEAR PROX 60 (STAPLE) ×1 IMPLANT
STAPLER PROXIMATE 75MM BLUE (STAPLE) ×1 IMPLANT
STAPLER VISISTAT 35W (STAPLE) ×1 IMPLANT
SUT ETHILON 2 0 FS 18 (SUTURE) ×1 IMPLANT
SUT MNCRL AB 4-0 PS2 18 (SUTURE) ×1 IMPLANT
SUT NOVA NAB GS-21 0 18 T12 DT (SUTURE) ×2 IMPLANT
SUT PDS AB 0 CT 36 (SUTURE) ×4 IMPLANT
SUT PDS AB 0 CTX 36 PDP370T (SUTURE) ×2 IMPLANT
SUT PROLENE 0 CT 1 CR/8 (SUTURE) ×1 IMPLANT
SUT SILK 2 0 SH CR/8 (SUTURE) ×1 IMPLANT
SUT SILK 3 0 SH CR/8 (SUTURE) ×2 IMPLANT
SUT VIC AB 3-0 SH 18 (SUTURE) ×1 IMPLANT
SUT VIC AB 3-0 SH 8-18 (SUTURE) ×1 IMPLANT
TOWEL GREEN STERILE (TOWEL DISPOSABLE) ×2 IMPLANT
TOWEL GREEN STERILE FF (TOWEL DISPOSABLE) ×2 IMPLANT

## 2020-10-23 NOTE — Anesthesia Procedure Notes (Addendum)
Procedure Name: Intubation Date/Time: 10/23/2020 1:05 PM Performed by: Annamary Carolin, CRNA Pre-anesthesia Checklist: Patient identified, Emergency Drugs available, Suction available and Patient being monitored Patient Re-evaluated:Patient Re-evaluated prior to induction Oxygen Delivery Method: Circle System Utilized Preoxygenation: Pre-oxygenation with 100% oxygen Induction Type: IV induction Ventilation: Mask ventilation without difficulty Laryngoscope Size: Glidescope and 3 Grade View: Grade II Tube type: Oral Tube size: 7.0 mm Number of attempts: 3 Airway Equipment and Method: Stylet Placement Confirmation: ETT inserted through vocal cords under direct vision, positive ETCO2 and breath sounds checked- equal and bilateral Secured at: 21 cm Tube secured with: Tape Dental Injury: Teeth and Oropharynx as per pre-operative assessment  Comments: CRNA x1 MAC2 no view, MDA x1 MIL2 no view, GS 3 with success.

## 2020-10-23 NOTE — Op Note (Signed)
Preoperative diagnosis: incisional hernia  Postoperative diagnosis: same   Procedure:  small bowel resection with anastomosis incisional hernia repair with mesh  Surgeon: Gurney Maxin, M.D.  Asst: Annye English, M.D.  Anesthesia: General  Indications for procedure: Stephanie [name] is a 65 y.o. year old female with symptoms of abdominal pain. CT showed concern of hernia with inflamed intestine.  Description of procedure: The patient was brought into the operative suite. Anesthesia was administered with General endotracheal anesthesia. WHO checklist was applied. The patient was then placed in supine position. The area was prepped and draped in the usual sterile fashion.  Next a midline incision was made over the hernia.  Cautery was used dissect down through subcutaneous tissues the hernia was identified.  Hernia sac was entered and completely divided.  There was a large amount of small intestine and colon and omentum in the hernia sac.  There is area of the transverse colon mesentery that had a hematoma initially concerning for ischemia in the mesentery.  There was one loop of small intestine with ischemic changes within the mesentery and changes concerning for intermittent obstruction.  Cautery was used for hemostasis and separation of the omentum and scar tissue from the abdominal wall.  The hernia itself was increase in size with upper midline fascial incision and everything was reduced.  Fascia was identified and freed up in 360 degrees.  Small intestine was reinspected and due to concern of scheming changes of the mesentery decision was made for small intestine resection.  This was done with 275 mm blue load GIA staplers.  A side-to-side functional end-to-end anastomosis was made with 75 mm GIA stapler and the enterotomy was closed with a 60 mm blue load TA stapler.  3-0 silk sutures were used for a antitension stitch as well as imbricating the edges of the staple line.  The anastomosis  appeared patent and was reduced back into the abdominal wall.  Next, defect was measured at 5 cm x 5 cm.  A phasic's ST mesh was cut down approximately 15 x 15 cm.  This was sutured in place with multiple 0 Novafil stitches.  Fascial defect was then closed with 0 PDS in running fashion.  A 19 [place] Blake drain was placed into the subcutaneous tissue plane and sutured in place with 2-0 nylon. multiple Vicryl's were used to oppose the umbilical skin to the repair and then the subcutaneous tissue to oppose itself and allow appropriate closure.  Skin was used for staples.  Patient will from anesthesia brought to PACU stable condition.  All counts were correct.  Findings: 5 cm hernia  Specimen: small bowel  Implant: 15 x 15 cm Phasix ST   Blood loss: 50 ml  Local anesthesia: none  Complications: none  Gurney Maxin, M.D. General, Bariatric, & Minimally Invasive Surgery [facility]

## 2020-10-23 NOTE — ED Notes (Signed)
Op-consent signed at bedside.

## 2020-10-23 NOTE — Transfer of Care (Signed)
Immediate Anesthesia Transfer of Care Note  Patient: Stephanie [name]  Procedure(s) Performed: OPEN HERNIA REPAIR INCISIONAL WITH MESH (Abdomen) SMALL BOWEL RESECTION (Abdomen) INSERTION OF MESH (Abdomen) EXPLORATORY LAPAROTOMY (Abdomen)  Patient Location: PACU  Anesthesia Type:General  Level of Consciousness: awake, drowsy and patient cooperative  Airway & Oxygen Therapy: Patient Spontanous Breathing and Patient connected to face mask oxygen  Post-op Assessment: Report given to RN, Post -op Vital signs reviewed and stable and Patient moving all extremities X 4  Post vital signs: Reviewed and stable  Last Vitals:  Vitals Value Taken Time  BP 123/59 10/23/20 1525  Temp 36.9 C 10/23/20 1525  Pulse 75 10/23/20 1528  Resp 13 10/23/20 1528  SpO2 97 % 10/23/20 1528  Vitals shown include unvalidated device data.  Last Pain:  Vitals:   10/23/20 1221  TempSrc: Oral  PainSc:          Complications: No notable events documented.

## 2020-10-23 NOTE — Progress Notes (Signed)
Pre Procedure note for inpatients:   Stephanie [name] has been scheduled for open incisional hernia repair today. The various methods of treatment have been discussed with the patient. After consideration of the risks, benefits and treatment options the patient has consented to the planned procedure.   The patient has been seen and labs reviewed. There are no changes in the patient's condition to prevent proceeding with the planned procedure today.  Recent labs:  Lab Results  Component Value Date   WBC 6.1 10/23/2020   HGB 9.8 (L) 10/23/2020   HCT 30.8 (L) 10/23/2020   PLT 209 10/23/2020   GLUCOSE 116 (H) 10/23/2020   CHOL 163 09/10/2020   TRIG 131.0 09/10/2020   HDL 43.00 09/10/2020   LDLDIRECT 89.0 12/05/2016   LDLCALC 94 09/10/2020   ALT 19 10/22/2020   AST 17 10/22/2020   NA 138 10/23/2020   K 4.2 10/23/2020   CL 105 10/23/2020   CREATININE 1.68 (H) 10/23/2020   BUN 29 (H) 10/23/2020   CO2 25 10/23/2020   HGBA1C 6.5 09/10/2020   MICROALBUR 48.1 (H) 06/02/2020    Mickeal Skinner, MD 10/23/2020 8:30 AM

## 2020-10-23 NOTE — Anesthesia Preprocedure Evaluation (Addendum)
Anesthesia Evaluation  Patient identified by MRN, date of birth, ID band Patient awake    Reviewed: Allergy & Precautions, NPO status , Patient's Chart, lab work & pertinent test results, reviewed documented beta blocker date and time   History of Anesthesia Complications Negative for: history of anesthetic complications  Airway Mallampati: II  TM Distance: >3 FB Neck ROM: Full    Dental  (+) Dental Advisory Given, Partial Upper   Pulmonary asthma ,    Pulmonary exam normal        Cardiovascular hypertension, Pt. on home beta blockers and Pt. on medications Normal cardiovascular exam     Neuro/Psych negative neurological ROS  negative psych ROS   GI/Hepatic negative GI ROS, Neg liver ROS,   Endo/Other  diabetes, Type 2, Insulin Dependent, Oral Hypoglycemic AgentsMorbid obesity  Renal/GU CRFRenal disease     Musculoskeletal negative musculoskeletal ROS (+)   Abdominal   Peds  Hematology  (+) anemia ,   Anesthesia Other Findings   Reproductive/Obstetrics                            Anesthesia Physical Anesthesia Plan  ASA: 3  Anesthesia Plan: General   Post-op Pain Management:    Induction: Intravenous  PONV Risk Score and Plan: 3 and Treatment may vary due to age or medical condition, Ondansetron and Dexamethasone  Airway Management Planned: Oral ETT  Additional Equipment: None  Intra-op Plan:   Post-operative Plan: Extubation in OR  Informed Consent: I have reviewed the patients History and Physical, chart, labs and discussed the procedure including the risks, benefits and alternatives for the proposed anesthesia with the patient or authorized representative who has indicated his/her understanding and acceptance.     Dental advisory given  Plan Discussed with: CRNA and Anesthesiologist  Anesthesia Plan Comments:        Anesthesia Quick Evaluation

## 2020-10-24 DIAGNOSIS — Z833 Family history of diabetes mellitus: Secondary | ICD-10-CM | POA: Diagnosis not present

## 2020-10-24 DIAGNOSIS — K436 Other and unspecified ventral hernia with obstruction, without gangrene: Secondary | ICD-10-CM | POA: Diagnosis present

## 2020-10-24 DIAGNOSIS — E785 Hyperlipidemia, unspecified: Secondary | ICD-10-CM | POA: Diagnosis present

## 2020-10-24 DIAGNOSIS — Z23 Encounter for immunization: Secondary | ICD-10-CM | POA: Diagnosis present

## 2020-10-24 DIAGNOSIS — Z20822 Contact with and (suspected) exposure to covid-19: Secondary | ICD-10-CM | POA: Diagnosis present

## 2020-10-24 DIAGNOSIS — K430 Incisional hernia with obstruction, without gangrene: Secondary | ICD-10-CM | POA: Diagnosis present

## 2020-10-24 DIAGNOSIS — K567 Ileus, unspecified: Secondary | ICD-10-CM | POA: Diagnosis not present

## 2020-10-24 DIAGNOSIS — I10 Essential (primary) hypertension: Secondary | ICD-10-CM | POA: Diagnosis present

## 2020-10-24 DIAGNOSIS — N179 Acute kidney failure, unspecified: Secondary | ICD-10-CM | POA: Diagnosis present

## 2020-10-24 DIAGNOSIS — R519 Headache, unspecified: Secondary | ICD-10-CM | POA: Diagnosis not present

## 2020-10-24 DIAGNOSIS — Z888 Allergy status to other drugs, medicaments and biological substances status: Secondary | ICD-10-CM | POA: Diagnosis not present

## 2020-10-24 DIAGNOSIS — E119 Type 2 diabetes mellitus without complications: Secondary | ICD-10-CM | POA: Diagnosis present

## 2020-10-24 DIAGNOSIS — Z794 Long term (current) use of insulin: Secondary | ICD-10-CM | POA: Diagnosis not present

## 2020-10-24 DIAGNOSIS — Z7984 Long term (current) use of oral hypoglycemic drugs: Secondary | ICD-10-CM | POA: Diagnosis not present

## 2020-10-24 DIAGNOSIS — Z885 Allergy status to narcotic agent status: Secondary | ICD-10-CM | POA: Diagnosis not present

## 2020-10-24 DIAGNOSIS — Z6841 Body Mass Index (BMI) 40.0 and over, adult: Secondary | ICD-10-CM | POA: Diagnosis not present

## 2020-10-24 DIAGNOSIS — Z90711 Acquired absence of uterus with remaining cervical stump: Secondary | ICD-10-CM | POA: Diagnosis not present

## 2020-10-24 DIAGNOSIS — K460 Unspecified abdominal hernia with obstruction, without gangrene: Secondary | ICD-10-CM | POA: Diagnosis present

## 2020-10-24 DIAGNOSIS — J45909 Unspecified asthma, uncomplicated: Secondary | ICD-10-CM | POA: Diagnosis present

## 2020-10-24 LAB — CBC
HCT: 32 % — ABNORMAL LOW (ref 36.0–46.0)
Hemoglobin: 10.1 g/dL — ABNORMAL LOW (ref 12.0–15.0)
MCH: 29.4 pg (ref 26.0–34.0)
MCHC: 31.6 g/dL (ref 30.0–36.0)
MCV: 93.3 fL (ref 80.0–100.0)
Platelets: 235 10*3/uL (ref 150–400)
RBC: 3.43 MIL/uL — ABNORMAL LOW (ref 3.87–5.11)
RDW: 13.2 % (ref 11.5–15.5)
WBC: 10.4 10*3/uL (ref 4.0–10.5)
nRBC: 0 % (ref 0.0–0.2)

## 2020-10-24 LAB — BASIC METABOLIC PANEL
Anion gap: 9 (ref 5–15)
BUN: 30 mg/dL — ABNORMAL HIGH (ref 8–23)
CO2: 23 mmol/L (ref 22–32)
Calcium: 8.6 mg/dL — ABNORMAL LOW (ref 8.9–10.3)
Chloride: 108 mmol/L (ref 98–111)
Creatinine, Ser: 1.86 mg/dL — ABNORMAL HIGH (ref 0.44–1.00)
GFR, Estimated: 29 mL/min — ABNORMAL LOW (ref >60)
Glucose, Bld: 146 mg/dL — ABNORMAL HIGH (ref 70–99)
Potassium: 4.7 mmol/L (ref 3.5–5.1)
Sodium: 140 mmol/L (ref 135–145)

## 2020-10-24 LAB — GLUCOSE, CAPILLARY
Glucose-Capillary: 151 mg/dL — ABNORMAL HIGH (ref 70–99)
Glucose-Capillary: 149 mg/dL — ABNORMAL HIGH (ref 70–99)
Glucose-Capillary: 146 mg/dL — ABNORMAL HIGH (ref 70–99)
Glucose-Capillary: 130 mg/dL — ABNORMAL HIGH (ref 70–99)
Glucose-Capillary: 121 mg/dL — ABNORMAL HIGH (ref 70–99)

## 2020-10-24 LAB — MAGNESIUM: Magnesium: 2 mg/dL (ref 1.7–2.4)

## 2020-10-24 MED ORDER — ACETAMINOPHEN 10 MG/ML IV SOLN
1000.0000 mg | Freq: Four times a day (QID) | INTRAVENOUS | Status: AC
  Administered 2020-10-24 – 2020-10-25 (×4): 1000 mg via INTRAVENOUS
  Filled 2020-10-24 (×5): qty 100

## 2020-10-24 MED ORDER — METHOCARBAMOL 1000 MG/10ML IJ SOLN
500.0000 mg | Freq: Three times a day (TID) | INTRAVENOUS | Status: DC
  Administered 2020-10-24 – 2020-10-29 (×16): 500 mg via INTRAVENOUS
  Filled 2020-10-24: qty 500
  Filled 2020-10-24: qty 5
  Filled 2020-10-24 (×3): qty 500
  Filled 2020-10-24: qty 5
  Filled 2020-10-24 (×4): qty 500
  Filled 2020-10-24: qty 5
  Filled 2020-10-24: qty 500
  Filled 2020-10-24 (×3): qty 5
  Filled 2020-10-24: qty 500
  Filled 2020-10-24 (×4): qty 5

## 2020-10-24 NOTE — Progress Notes (Signed)
Patient admitted from PACU for observation post hernia repair surgery. Patient arrived into the unit at about 2030. Alert and oriented x4. Skin assessment done with second Nurse, area of incisions assessed has minimal drainage. Vital signs stable, NGT connected to LWS. Will continue to monitor.

## 2020-10-24 NOTE — Evaluation (Addendum)
Physical Therapy Evaluation Patient Details Name: Stephanie [name] MRN: [id number] DOB: [birth year] Today's Date: 10/24/2020  History of Present Illness  65 year old female a longstanding ventral incisional hernia who presented to the ED 10/23/20 with nausea and worsening pain at the hernia. 9/30 open hernia repair with small bowel resection and anastomosis  Clinical Impression   Pt admitted secondary to problem above with deficits below. PTA patient was independent with grandson living with her. Pt currently requires up to moderate assistance for bed mobility and min assist for transfers. Primarily limited by pain and hypotension. Unable to assess ambulation and pt has 3 steps to get to level with her bedroom/bathroom. Anticipate patient will benefit from PT to address problems listed below.Will continue to follow acutely to maximize functional mobility independence and safety.          Recommendations for follow up therapy are one component of a multi-disciplinary discharge planning process, led by the attending physician.  Recommendations may be updated based on patient status, additional functional criteria and insurance authorization.  Follow Up Recommendations No PT follow up;Supervision for mobility/OOB (79 yo grandson available 24/7)    Equipment Recommendations  None recommended by PT    Recommendations for Other Services       Precautions / Restrictions Precautions Precautions: Fall;Other (comment) Precaution Comments: JP drain; orthostasis      Mobility  Bed Mobility Overal bed mobility: Needs Assistance Bed Mobility: Rolling;Sidelying to Sit Rolling: Mod assist Sidelying to sit: Mod assist       General bed mobility comments: rolling with rail and max cues; max cues and assist to come to sit    Transfers Overall transfer level: Needs assistance Equipment used: 1 person hand held assist Transfers: Sit to/from Stand Sit to Stand: Min assist         General  transfer comment: one hand on bedrail  Ambulation/Gait Ambulation/Gait assistance: Min assist Gait Distance (Feet): 3 Feet Assistive device: 1 person hand held assist Gait Pattern/deviations: Step-to pattern;Shuffle     General Gait Details: steps to chair only due to dizziness; BP once in chair with feet elevated 94/49 and RN made aware  Stairs            Wheelchair Mobility    Modified Rankin (Stroke Patients Only)       Balance Overall balance assessment: Mild deficits observed, not formally tested                                           Pertinent Vitals/Pain Pain Assessment: 0-10 Pain Score: 9  Pain Location: abd Pain Descriptors / Indicators: Guarding;Operative site guarding;Sharp Pain Intervention(s): Limited activity within patient's tolerance;Monitored during session    Home Living Family/patient expects to be discharged to:: Private residence Living Arrangements: Other relatives (grandson) Available Help at Discharge: Family;Available 24 hours/day Type of Home: Apartment Home Access: Level entry     Home Layout: Two level Home Equipment: None      Prior Function Level of Independence: Independent         Comments: retired Physicist, medical Dominance   Dominant Hand: Right    Extremity/Trunk Assessment   Upper Extremity Assessment Upper Extremity Assessment: Overall WFL for tasks assessed    Lower Extremity Assessment Lower Extremity Assessment: Overall WFL for tasks assessed    Cervical / Trunk Assessment Cervical / Trunk Assessment: Other exceptions  Cervical / Trunk Exceptions: hernia repair incision with JP drain; obese  Communication   Communication: No difficulties  Cognition Arousal/Alertness: Awake/alert Behavior During Therapy: WFL for tasks assessed/performed Overall Cognitive Status: Within Functional Limits for tasks assessed                                        General  Comments General comments (skin integrity, edema, etc.): 2L O2 removed for transfer with sats down to 88%; resumed O2 with sats up to 95%    Exercises     Assessment/Plan    PT Assessment Patient needs continued PT services  PT Problem List Decreased activity tolerance;Decreased balance;Decreased mobility;Decreased knowledge of use of DME;Cardiopulmonary status limiting activity;Obesity;Pain       PT Treatment Interventions DME instruction;Gait training;Stair training;Functional mobility training;Therapeutic activities;Therapeutic exercise;Patient/family education    PT Goals (Current goals can be found in the Care Plan section)  Acute Rehab PT Goals Patient Stated Goal: hurt less PT Goal Formulation: With patient Time For Goal Achievement: 11/07/20 Potential to Achieve Goals: Good    Frequency Min 3X/week   Barriers to discharge        Co-evaluation               AM-PAC PT "6 Clicks" Mobility  Outcome Measure Help needed turning from your back to your side while in a flat bed without using bedrails?: A Lot Help needed moving from lying on your back to sitting on the side of a flat bed without using bedrails?: A Lot Help needed moving to and from a bed to a chair (including a wheelchair)?: A Little Help needed standing up from a chair using your arms (e.g., wheelchair or bedside chair)?: A Little Help needed to walk in hospital room?: Total Help needed climbing 3-5 steps with a railing? : Total 6 Click Score: 12    End of Session   Activity Tolerance: Patient limited by pain;Treatment limited secondary to medical complications (Comment) (orthostasis) Patient left: in chair;with call bell/phone within reach (no alarm as pt a&O with RN in agreement) Nurse Communication: Mobility status;Other (comment) (hypotension) PT Visit Diagnosis: Unsteadiness on feet (R26.81)    Time: 1010-1036 PT Time Calculation (min) (ACUTE ONLY): 26 min   Charges:   PT Evaluation $PT  Eval Low Complexity: 1 Low PT Treatments $Therapeutic Activity: 8-22 mins         Arby Barrette, PT Pager [phone]   Rexanne Mano 10/24/2020, 10:50 AM

## 2020-10-24 NOTE — Progress Notes (Signed)
Patient ID: Stephanie [name], female   DOB: October 11, 1955, 65 y.o.   MRN: [id number]  [facility] Surgery Progress Note  1 Day Post-Op  Subjective: CC-  Having a lot of abdominal pain. She was given 1 oxy tab this morning. NG tube in and to LIWS. No flatus or BM. Has not gotten OOB since surgery.  Objective: Vital signs in last 24 hours: Temp:  [97.4 F (36.3 C)-98.4 F (36.9 C)] 98.2 F (36.8 C) (10/01 0820) Pulse Rate:  [68-100] 100 (10/01 0820) Resp:  [12-24] 20 (10/01 0820) BP: (122-157)/(56-81) 130/56 (10/01 0820) SpO2:  [93 %-98 %] 94 % (10/01 0820)    Intake/Output from previous day: 09/30 0701 - 10/01 0700 In: 1970 [I.V.:1870; IV Piggyback:100] Out: 1340 [Urine:1125; Drains:115; Blood:100] Intake/Output this shift: No intake/output data recorded.  PE: Gen:  Alert, NAD, pleasant HEENT: EOM's intact, pupils equal and round. NG tube in nare Card:  RRR Pulm:  CTAB, no W/R/R, rate and effort normal Abd: obese, soft, appropriately tender, midline incision cdi with staples present and no erythema or drainage, JP with minimal serosanguinous fluid in bulb  Lab Results:  Recent Labs    10/22/20 1213 10/23/20 0640  WBC 8.5 6.1  HGB 11.3* 9.8*  HCT 35.8* 30.8*  PLT 270 209   BMET Recent Labs    10/22/20 1213 10/23/20 0640  NA 137 138  K 5.1 4.2  CL 98 105  CO2 27 25  GLUCOSE 142* 116*  BUN 34* 29*  CREATININE 1.98* 1.68*  CALCIUM 9.9 9.0   PT/INR No results for input(s): LABPROT, INR in the last 72 hours. CMP     Component Value Date/Time   NA 138 10/23/2020 0640   K 4.2 10/23/2020 0640   CL 105 10/23/2020 0640   CO2 25 10/23/2020 0640   GLUCOSE 116 (H) 10/23/2020 0640   BUN 29 (H) 10/23/2020 0640   CREATININE 1.68 (H) 10/23/2020 0640   CALCIUM 9.0 10/23/2020 0640   PROT 7.7 10/22/2020 1213   ALBUMIN 3.7 10/22/2020 1213   AST 17 10/22/2020 1213   ALT 19 10/22/2020 1213   ALKPHOS 52 10/22/2020 1213   BILITOT 1.0 10/22/2020 1213   GFRNONAA 33  (L) 10/23/2020 0640   GFRAA 36 (L) 09/18/2019 1507   Lipase     Component Value Date/Time   LIPASE 48 10/22/2020 1213       Studies/Results: CT Abdomen Pelvis Wo Contrast  Result Date: 10/22/2020 CLINICAL DATA:  Abdominal wall hernia EXAM: CT ABDOMEN AND PELVIS WITHOUT CONTRAST TECHNIQUE: Multidetector CT imaging of the abdomen and pelvis was performed following the standard protocol without IV contrast. Unenhanced CT was performed per clinician order. Lack of IV contrast limits sensitivity and specificity, especially for evaluation of abdominal/pelvic solid viscera. COMPARISON:  09/29/2020 FINDINGS: Lower chest: No acute pleural or parenchymal lung disease. Hepatobiliary: No focal liver abnormality is seen. No gallstones, gallbladder wall thickening, or biliary dilatation. Pancreas: Unremarkable. No pancreatic ductal dilatation or surrounding inflammatory changes. Spleen: Normal in size without focal abnormality. Adrenals/Urinary Tract: No urinary tract calculi or obstructive uropathy. Stable left renal cyst. Bladder is grossly normal. The adrenals are unremarkable. Stomach/Bowel: There is a large umbilical hernia containing multiple loops of large and small bowel. Dilated loop of jejunum extends into the inferior margin of the umbilical hernia, with resulting wall thickening, fat stranding, and small bowel dilation. 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 This has progressed since prior study. Normal appendix. Diverticulosis of the  distal colon without diverticulitis. Vascular/Lymphatic: No significant vascular findings are present. No enlarged abdominal or pelvic lymph nodes. Reproductive: Status post hysterectomy. No adnexal masses. Other: Progressive edema at the root of the mesentery. No free fluid or free gas. Large umbilical hernia as above, with abdominal wall defect measuring approximately 5.2 cm in maximal dimension. Musculoskeletal: No  acute or destructive bony lesions. Reconstructed images demonstrate no additional findings. IMPRESSION: 1. Dilated thick-walled segment of jejunum which extends into the inferior margin of the known large umbilical hernia, consistent with closed loop obstruction and possible incarcerated hernia. Increased mesenteric edema and bowel wall thickening since prior study. 2. Distal colonic diverticulosis without diverticulitis. Electronically Signed   By: Randa Ngo M.D.   On: 10/22/2020 18:41   DG Abd Portable 1V  Result Date: 10/23/2020 CLINICAL DATA:  Enteric tube placement. EXAM: PORTABLE ABDOMEN - 1 VIEW COMPARISON:  CT abdomen pelvis from yesterday. FINDINGS: Enteric tube in the stomach. Incompletely visualized surgical drain overlying the pelvis. The bowel gas pattern is normal. No radio-opaque calculi or other significant radiographic abnormality are seen. IMPRESSION: 1. Enteric tube in the stomach. Electronically Signed   By: Titus Dubin M.D.   On: 10/23/2020 16:13    Anti-infectives: Anti-infectives (From admission, onward)    Start     Dose/Rate Route Frequency Ordered Stop   10/23/20 0830  ceFAZolin (ANCEF) IVPB 2g/100 mL premix        2 g 200 mL/hr over 30 Minutes Intravenous On call to O.R. 10/23/20 0829 10/23/20 1315   10/23/20 0800  cefoTEtan (CEFOTAN) 2 g in sodium chloride 0.9 % 100 mL IVPB  Status:  Discontinued        2 g 200 mL/hr over 30 Minutes Intravenous To [facility] 10/23/20 0635 10/23/20 0852        Assessment/Plan  POD#1 S/p small bowel resection with anastomosis, incisional hernia repair with mesh 9/30 Dr. Kieth Brightly - continue NPO/NGT to [facility] and await return in bowel function - continue JP and monitor output, currently serosanguinous - mobilize, will ask PT to see - add scheduled IV robaxin and IV tylenol for better pain control - labs pending  ID - periop FEN - IVF, NPO/NGT to LIWS VTE - sq heparin, SCDs Foley - d/c 10/1 when  mobilizing  HTN HLD DM  Obesity BMI 41.85   LOS: 0 days    [name], [facility] Surgery 10/24/2020, 8:28 AM Please see Amion for pager number during day hours 7:00am-4:30pm

## 2020-10-24 NOTE — Plan of Care (Signed)
Patient is a new admit for hernia repair, pain managed with PRN medication, area of incision  intact and clean. No sign of infection noted Problem: Clinical Measurements: Goal: Will remain free from infection Outcome: Progressing Goal: Respiratory complications will improve Outcome: Progressing   Problem: Pain Managment: Goal: General experience of comfort will improve Outcome: Progressing   Problem: Skin Integrity: Goal: Risk for impaired skin integrity will decrease Outcome: Progressing

## 2020-10-25 LAB — BASIC METABOLIC PANEL
Anion gap: 9 (ref 5–15)
BUN: 31 mg/dL — ABNORMAL HIGH (ref 8–23)
CO2: 21 mmol/L — ABNORMAL LOW (ref 22–32)
Calcium: 8.2 mg/dL — ABNORMAL LOW (ref 8.9–10.3)
Chloride: 109 mmol/L (ref 98–111)
Creatinine, Ser: 1.84 mg/dL — ABNORMAL HIGH (ref 0.44–1.00)
GFR, Estimated: 30 mL/min — ABNORMAL LOW (ref >60)
Glucose, Bld: 135 mg/dL — ABNORMAL HIGH (ref 70–99)
Potassium: 4.3 mmol/L (ref 3.5–5.1)
Sodium: 139 mmol/L (ref 135–145)

## 2020-10-25 LAB — GLUCOSE, CAPILLARY
Glucose-Capillary: 106 mg/dL — ABNORMAL HIGH (ref 70–99)
Glucose-Capillary: 107 mg/dL — ABNORMAL HIGH (ref 70–99)
Glucose-Capillary: 109 mg/dL — ABNORMAL HIGH (ref 70–99)
Glucose-Capillary: 109 mg/dL — ABNORMAL HIGH (ref 70–99)
Glucose-Capillary: 126 mg/dL — ABNORMAL HIGH (ref 70–99)
Glucose-Capillary: 130 mg/dL — ABNORMAL HIGH (ref 70–99)
Glucose-Capillary: 94 mg/dL (ref 70–99)

## 2020-10-25 LAB — CBC
HCT: 28 % — ABNORMAL LOW (ref 36.0–46.0)
Hemoglobin: 8.9 g/dL — ABNORMAL LOW (ref 12.0–15.0)
MCH: 30.2 pg (ref 26.0–34.0)
MCHC: 31.8 g/dL (ref 30.0–36.0)
MCV: 94.9 fL (ref 80.0–100.0)
Platelets: 207 10*3/uL (ref 150–400)
RBC: 2.95 MIL/uL — ABNORMAL LOW (ref 3.87–5.11)
RDW: 13.5 % (ref 11.5–15.5)
WBC: 10.1 10*3/uL (ref 4.0–10.5)
nRBC: 0 % (ref 0.0–0.2)

## 2020-10-25 MED ORDER — PHENOL 1.4 % MT LIQD
2.0000 | OROMUCOSAL | Status: DC | PRN
  Administered 2020-10-25 – 2020-10-27 (×2): 2 via OROMUCOSAL
  Filled 2020-10-25: qty 177

## 2020-10-25 MED ORDER — ACETAMINOPHEN 10 MG/ML IV SOLN
1000.0000 mg | Freq: Four times a day (QID) | INTRAVENOUS | Status: AC
  Administered 2020-10-25 – 2020-10-26 (×4): 1000 mg via INTRAVENOUS
  Filled 2020-10-25 (×4): qty 100

## 2020-10-25 NOTE — Progress Notes (Signed)
Physical Therapy Treatment Patient Details Name: Stephanie [name] MRN: [id number] DOB: [birth year] Today's Date: 10/25/2020   History of Present Illness 65 year old female a longstanding ventral incisional hernia who presented to the ED 10/23/20 with nausea and worsening pain at the hernia. 9/30 open hernia repair with small bowel resection and anastomosis    PT Comments    Pt moving very slowly, but determined to mobilize without physical assist. Did need min assist to elevate torso to full sitting and minguard assist for safety during standing and walking to chair. RN not available to confirm if OK to take NG tube off suction to allow further ambulation (appeared to be having high output throughout session). If pt not able to walk next session, may need to discuss her discharge plans again and if 38 yo grandson can handle her needs.     Recommendations for follow up therapy are one component of a multi-disciplinary discharge planning process, led by the attending physician.  Recommendations may be updated based on patient status, additional functional criteria and insurance authorization.  Follow Up Recommendations  No PT follow up;Supervision for mobility/OOB (43 yo grandson available 24/7)     Equipment Recommendations  None recommended by PT    Recommendations for Other Services       Precautions / Restrictions Precautions Precautions: Fall;Other (comment) Precaution Comments: JP drain     Mobility  Bed Mobility Overal bed mobility: Needs Assistance Bed Mobility: Rolling;Sidelying to Sit Rolling: Supervision Sidelying to sit: Min assist;HOB elevated       General bed mobility comments: rolling with rail and mod cues; min assist to raise up from elbow to full sitting    Transfers Overall transfer level: Needs assistance Equipment used: None Transfers: Sit to/from Stand Sit to Stand: Min guard         General transfer comment: pt holding belly with both hands  (supporting) did not want PT to do anything for her, wanting to do it herself  Ambulation/Gait Ambulation/Gait assistance: Min guard Gait Distance (Feet): 3 Feet Assistive device: None Gait Pattern/deviations: Step-to pattern;Shuffle     General Gait Details: small steps to chair only denied dizziness; limited by NG suction that was having incr output and not sure it could be disconnected   Chief Strategy Officer    Modified Rankin (Stroke Patients Only)       Balance Overall balance assessment: No apparent balance deficits (not formally assessed)                                          Cognition Arousal/Alertness: Awake/alert Behavior During Therapy: WFL for tasks assessed/performed Overall Cognitive Status: Within Functional Limits for tasks assessed                                        Exercises      General Comments General comments (skin integrity, edema, etc.): 2L O2 remained on during transfer      Pertinent Vitals/Pain Pain Assessment: 0-10 Pain Score: 8  Pain Location: abd Pain Descriptors / Indicators: Guarding;Operative site guarding;Sharp Pain Intervention(s): Limited activity within patient's tolerance    Home Living  Prior Function            PT Goals (current goals can now be found in the care plan section) Acute Rehab PT Goals Patient Stated Goal: hurt less PT Goal Formulation: With patient Time For Goal Achievement: 11/07/20 Potential to Achieve Goals: Good Progress towards PT goals: Progressing toward goals    Frequency    Min 3X/week      PT Plan Current plan remains appropriate    Co-evaluation              AM-PAC PT "6 Clicks" Mobility   Outcome Measure  Help needed turning from your back to your side while in a flat bed without using bedrails?: A Little Help needed moving from lying on your back to sitting on the side of a flat  bed without using bedrails?: A Little Help needed moving to and from a bed to a chair (including a wheelchair)?: A Little Help needed standing up from a chair using your arms (e.g., wheelchair or bedside chair)?: A Little Help needed to walk in hospital room?: A Little Help needed climbing 3-5 steps with a railing? : A Lot 6 Click Score: 17    End of Session Equipment Utilized During Treatment: Oxygen Activity Tolerance: Patient limited by pain Patient left: in chair;with call bell/phone within reach (no alarm as pt a&O with RN in agreement) Nurse Communication: Mobility status PT Visit Diagnosis: Unsteadiness on feet (R26.81)     Time: [id number] PT Time Calculation (min) (ACUTE ONLY): 32 min  Charges:  $Gait Training: 8-22 mins $Therapeutic Activity: 8-22 mins                      Arby Barrette, PT Pager [phone]    Rexanne Mano 10/25/2020, 3:33 PM

## 2020-10-25 NOTE — Progress Notes (Signed)
Patient ID: Stephanie [name], female   DOB: 26-Jul-1955, 65 y.o.   MRN: [id number]  [facility] Surgery Progress Note  2 Days Post-Op  Subjective: CC-  Having quite a bit of abdominal pain but better than yesterday. C/o NG irritation on her throat No flatus or BM. Has gotten OOB to chair.  Objective: Vital signs in last 24 hours: Temp:  [97.9 F (36.6 C)-98.7 F (37.1 C)] 98.3 F (36.8 C) (10/02 0743) Pulse Rate:  [88-93] 88 (10/02 0743) Resp:  [17-19] 18 (10/02 0743) BP: (117-157)/(61-74) 133/74 (10/02 0743) SpO2:  [97 %-100 %] 100 % (10/02 0743) Weight:  [116.6 kg] 116.6 kg (10/02 0357)    Intake/Output from previous day: 10/01 0701 - 10/02 0700 In: 1178.2 [I.V.:863.5; IV Piggyback:314.7] Out: [id number] [Urine:850; Emesis/NG output:800; Drains:65] Intake/Output this shift: Total I/O In: 0  Out: 510 [Urine:425; Emesis/NG output:75; Drains:10]  PE: Gen:  Alert, NAD, pleasant Abd: obese, soft, appropriately tender, midline incision cdi with staples present and no erythema or drainage, JP with minimal serosanguinous fluid in bulb  Lab Results:  Recent Labs    10/24/20 0817 10/25/20 0028  WBC 10.4 10.1  HGB 10.1* 8.9*  HCT 32.0* 28.0*  PLT 235 207    BMET Recent Labs    10/24/20 0817 10/25/20 0028  NA 140 139  K 4.7 4.3  CL 108 109  CO2 23 21*  GLUCOSE 146* 135*  BUN 30* 31*  CREATININE 1.86* 1.84*  CALCIUM 8.6* 8.2*    PT/INR No results for input(s): LABPROT, INR in the last 72 hours. CMP     Component Value Date/Time   NA 139 10/25/2020 0028   K 4.3 10/25/2020 0028   CL 109 10/25/2020 0028   CO2 21 (L) 10/25/2020 0028   GLUCOSE 135 (H) 10/25/2020 0028   BUN 31 (H) 10/25/2020 0028   CREATININE 1.84 (H) 10/25/2020 0028   CALCIUM 8.2 (L) 10/25/2020 0028   PROT 7.7 10/22/2020 1213   ALBUMIN 3.7 10/22/2020 1213   AST 17 10/22/2020 1213   ALT 19 10/22/2020 1213   ALKPHOS 52 10/22/2020 1213   BILITOT 1.0 10/22/2020 1213   GFRNONAA 30 (L)  10/25/2020 0028   GFRAA 36 (L) 09/18/2019 1507   Lipase     Component Value Date/Time   LIPASE 48 10/22/2020 1213       Studies/Results: DG Abd Portable 1V  Result Date: 10/23/2020 CLINICAL DATA:  Enteric tube placement. EXAM: PORTABLE ABDOMEN - 1 VIEW COMPARISON:  CT abdomen pelvis from yesterday. FINDINGS: Enteric tube in the stomach. Incompletely visualized surgical drain overlying the pelvis. The bowel gas pattern is normal. No radio-opaque calculi or other significant radiographic abnormality are seen. IMPRESSION: 1. Enteric tube in the stomach. Electronically Signed   By: Titus Dubin M.D.   On: 10/23/2020 16:13    Anti-infectives: Anti-infectives (From admission, onward)    Start     Dose/Rate Route Frequency Ordered Stop   10/23/20 0830  ceFAZolin (ANCEF) IVPB 2g/100 mL premix        2 g 200 mL/hr over 30 Minutes Intravenous On call to O.R. 10/23/20 0829 10/23/20 1315   10/23/20 0800  cefoTEtan (CEFOTAN) 2 g in sodium chloride 0.9 % 100 mL IVPB  Status:  Discontinued        2 g 200 mL/hr over 30 Minutes Intravenous To [facility] 10/23/20 0635 10/23/20 [id number]        Assessment/Plan  POD#2 S/p small bowel resection with anastomosis, incisional hernia repair with  mesh 9/30 Dr. Kieth Brightly - continue NPO/NGT to [facility] and await return in bowel function - continue JP and monitor output, currently serosanguinous - mobilize, PT on board - Cont scheduled IV robaxin and IV tylenol for better pain control - binder to help with pain with ambulation - chloraseptic ordered for throat pain  ID - periop FEN - IVF, NPO/NGT to LIWS VTE - sq heparin, SCDs Foley - d/c 10/1   HTN HLD DM  Obesity BMI 41.85   LOS: 1 day   Rosario Adie, MD  [facility] [place] [facility]

## 2020-10-26 ENCOUNTER — Encounter (HOSPITAL_COMMUNITY): Payer: Self-pay | Admitting: General Surgery

## 2020-10-26 LAB — GLUCOSE, CAPILLARY
Glucose-Capillary: 114 mg/dL — ABNORMAL HIGH (ref 70–99)
Glucose-Capillary: 135 mg/dL — ABNORMAL HIGH (ref 70–99)
Glucose-Capillary: 135 mg/dL — ABNORMAL HIGH (ref 70–99)
Glucose-Capillary: 159 mg/dL — ABNORMAL HIGH (ref 70–99)
Glucose-Capillary: 180 mg/dL — ABNORMAL HIGH (ref 70–99)
Glucose-Capillary: 136 mg/dL — ABNORMAL HIGH (ref 70–99)
Glucose-Capillary: 129 mg/dL — ABNORMAL HIGH (ref 70–99)
Glucose-Capillary: 137 mg/dL — ABNORMAL HIGH (ref 70–99)

## 2020-10-26 MED ORDER — INFLUENZA VAC A&B SA ADJ QUAD 0.5 ML IM PRSY
0.5000 mL | PREFILLED_SYRINGE | INTRAMUSCULAR | Status: AC
  Administered 2020-10-27: 0.5 mL via INTRAMUSCULAR
  Filled 2020-10-26: qty 0.5

## 2020-10-26 MED ORDER — ACETAMINOPHEN 10 MG/ML IV SOLN
1000.0000 mg | Freq: Four times a day (QID) | INTRAVENOUS | Status: AC
  Administered 2020-10-26 – 2020-10-27 (×4): 1000 mg via INTRAVENOUS
  Filled 2020-10-26 (×4): qty 100

## 2020-10-26 MED ORDER — PANTOPRAZOLE SODIUM 40 MG IV SOLR
40.0000 mg | Freq: Two times a day (BID) | INTRAVENOUS | Status: DC
  Administered 2020-10-26 – 2020-10-30 (×9): 40 mg via INTRAVENOUS
  Filled 2020-10-26 (×9): qty 40

## 2020-10-26 NOTE — Anesthesia Postprocedure Evaluation (Signed)
Anesthesia Post Note  Patient: Stephanie [name]  Procedure(s) Performed: OPEN HERNIA REPAIR INCISIONAL WITH MESH (Abdomen) SMALL BOWEL RESECTION (Abdomen) INSERTION OF MESH (Abdomen) EXPLORATORY LAPAROTOMY (Abdomen)     Patient location during evaluation: PACU Anesthesia Type: General Level of consciousness: awake and alert Pain management: pain level controlled Vital Signs Assessment: post-procedure vital signs reviewed and stable Respiratory status: spontaneous breathing, nonlabored ventilation, respiratory function stable and patient connected to nasal cannula oxygen Cardiovascular status: blood pressure returned to baseline and stable Postop Assessment: no apparent nausea or vomiting Anesthetic complications: no   No notable events documented.  Last Vitals:  Vitals:   10/26/20 0801 10/26/20 1243  BP: (!) 154/93 (!) 157/69  Pulse: 100 99  Resp: 18 17  Temp: 37 C 37 C  SpO2: 99% 95%    Last Pain:  Vitals:   10/26/20 1243  TempSrc: Oral  PainSc:                  [name]

## 2020-10-26 NOTE — Progress Notes (Signed)
Progress Note  3 Days Post-Op  Subjective: Patient reports abdomen is sore. She reports she vomited around NGT this AM and that has made her more sore. She is passing flatus. Had some leaking from NGT overnight.   Objective: Vital signs in last 24 hours: Temp:  [97.6 F (36.4 C)-99.2 F (37.3 C)] 98.6 F (37 C) (10/03 0801) Pulse Rate:  [90-109] 100 (10/03 0801) Resp:  [16-20] 18 (10/03 0801) BP: (147-165)/(66-98) 154/93 (10/03 0801) SpO2:  [90 %-100 %] 99 % (10/03 0801)    Intake/Output from previous day: 10/02 0701 - 10/03 0700 In: 3180.8 [P.O.:260; I.V.:2820.8; IV Piggyback:100] Out: 2320 [Urine:825; Emesis/NG output:1450; Drains:45] Intake/Output this shift: Total I/O In: -  Out: 600 [Urine:600]  PE: Gen:  Alert, NAD, pleasant Abd: obese, soft, appropriately tender, midline incision cdi with staples present and no erythema or drainage, JP with minimal serosanguinous fluid in bulb, NGT with bloody appearing drainage   Lab Results:  Recent Labs    10/24/20 0817 10/25/20 0028  WBC 10.4 10.1  HGB 10.1* 8.9*  HCT 32.0* 28.0*  PLT 235 207   BMET Recent Labs    10/24/20 0817 10/25/20 0028  NA 140 139  K 4.7 4.3  CL 108 109  CO2 23 21*  GLUCOSE 146* 135*  BUN 30* 31*  CREATININE 1.86* 1.84*  CALCIUM 8.6* 8.2*   PT/INR No results for input(s): LABPROT, INR in the last 72 hours. CMP     Component Value Date/Time   NA 139 10/25/2020 0028   K 4.3 10/25/2020 0028   CL 109 10/25/2020 0028   CO2 21 (L) 10/25/2020 0028   GLUCOSE 135 (H) 10/25/2020 0028   BUN 31 (H) 10/25/2020 0028   CREATININE 1.84 (H) 10/25/2020 0028   CALCIUM 8.2 (L) 10/25/2020 0028   PROT 7.7 10/22/2020 1213   ALBUMIN 3.7 10/22/2020 1213   AST 17 10/22/2020 1213   ALT 19 10/22/2020 1213   ALKPHOS 52 10/22/2020 1213   BILITOT 1.0 10/22/2020 1213   GFRNONAA 30 (L) 10/25/2020 0028   GFRAA 36 (L) 09/18/2019 1507   Lipase     Component Value Date/Time   LIPASE 48 10/22/2020 1213        Studies/Results: No results found.  Anti-infectives: Anti-infectives (From admission, onward)    Start     Dose/Rate Route Frequency Ordered Stop   10/23/20 0830  ceFAZolin (ANCEF) IVPB 2g/100 mL premix        2 g 200 mL/hr over 30 Minutes Intravenous On call to O.R. 10/23/20 0829 10/23/20 1315   10/23/20 0800  cefoTEtan (CEFOTAN) 2 g in sodium chloride 0.9 % 100 mL IVPB  Status:  Discontinued        2 g 200 mL/hr over 30 Minutes Intravenous To [facility] 10/23/20 0635 10/23/20 [id number]        Assessment/Plan POD#3 S/p small bowel resection with anastomosis, incisional hernia repair with mesh 9/30 Dr. Kieth Brightly - continue NPO/NGT to LIWS - passing some flatus but vomited this AM, will discuss possible clamping trials later today with MD - continue JP and monitor output, currently serosanguinous - mobilize, PT on board - Cont scheduled IV robaxin and IV tylenol for better pain control - binder to help with pain with ambulation - added BID PPI for bloody NGT drainage   ID - periop FEN - IVF, NPO/NGT to LIWS VTE - sq heparin, SCDs Foley - d/c 10/1    HTN HLD DM  Obesity BMI 41.85  LOS:  2 days    [name], [facility] Surgery 10/26/2020, 9:13 AM Please see Amion for pager number during day hours 7:00am-4:30pm

## 2020-10-26 NOTE — Progress Notes (Signed)
Physical Therapy Treatment Patient Details Name: Stephanie [name] MRN: [id number] DOB: 1955-07-30 Today's Date: 10/26/2020   History of Present Illness 65 year old female a longstanding ventral incisional hernia who presented to the ED 10/23/20 with nausea and worsening pain at the hernia. 9/30 open hernia repair with small bowel resection and anastomosis    PT Comments    Patient mobilizing much better today. Able to get to EOB with supervision, min assist to stand and walked 40 ft with RW with minguard assist. Discussed discharge plan again and pt feels certain she can manage at home with grandson's assistance.    Recommendations for follow up therapy are one component of a multi-disciplinary discharge planning process, led by the attending physician.  Recommendations may be updated based on patient status, additional functional criteria and insurance authorization.  Follow Up Recommendations  No PT follow up;Supervision for mobility/OOB (65 yo grandson available 24/7)     Equipment Recommendations  None recommended by PT    Recommendations for Other Services       Precautions / Restrictions Precautions Precautions: Fall;Other (comment) Precaution Comments: JP drain     Mobility  Bed Mobility Overal bed mobility: Needs Assistance Bed Mobility: Rolling;Sidelying to Sit Rolling: Supervision Sidelying to sit: HOB elevated;Supervision (HOB 20)       General bed mobility comments: no cues needed; +use of rail    Transfers Overall transfer level: Needs assistance Equipment used: Rolling walker (2 wheeled) Transfers: Sit to/from Stand Sit to Stand: Min assist         General transfer comment: pt requesting pull up on PTs hand  Ambulation/Gait Ambulation/Gait assistance: Min guard Gait Distance (Feet): 40 Feet Assistive device: Rolling walker (2 wheeled) Gait Pattern/deviations: Shuffle;Step-through pattern Gait velocity: slow, but improved   General Gait Details:  RN ok'd disconnecting NG tube for ambulation; on way past sink stopped to perform pericare and wash legs due to purewick had leaked in bed (pt dependent in bathing but did stand ~5 minutes)   Stairs             Wheelchair Mobility    Modified Rankin (Stroke Patients Only)       Balance Overall balance assessment: No apparent balance deficits (not formally assessed)                                          Cognition Arousal/Alertness: Awake/alert Behavior During Therapy: WFL for tasks assessed/performed Overall Cognitive Status: Within Functional Limits for tasks assessed                                        Exercises      General Comments        Pertinent Vitals/Pain Pain Assessment: Faces Faces Pain Scale: Hurts little more Pain Location: abd Pain Descriptors / Indicators: Guarding;Operative site guarding Pain Intervention(s): Limited activity within patient's tolerance;Monitored during session    Home Living                      Prior Function            PT Goals (current goals can now be found in the care plan section) Acute Rehab PT Goals Patient Stated Goal: hurt less PT Goal Formulation: With patient Time For Goal Achievement: 11/07/20 Potential to  Achieve Goals: Good Progress towards PT goals: Progressing toward goals    Frequency    Min 3X/week      PT Plan Current plan remains appropriate    Co-evaluation              AM-PAC PT "6 Clicks" Mobility   Outcome Measure  Help needed turning from your back to your side while in a flat bed without using bedrails?: None Help needed moving from lying on your back to sitting on the side of a flat bed without using bedrails?: A Little Help needed moving to and from a bed to a chair (including a wheelchair)?: A Little Help needed standing up from a chair using your arms (e.g., wheelchair or bedside chair)?: A Little Help needed to walk in  hospital room?: A Little Help needed climbing 3-5 steps with a railing? : A Lot 6 Click Score: 18    End of Session   Activity Tolerance: Patient tolerated treatment well Patient left: in chair;with call bell/phone within reach (no alarm as pt a&O with RN in agreement) Nurse Communication: Mobility status PT Visit Diagnosis: Unsteadiness on feet (R26.81)     Time: [id number] PT Time Calculation (min) (ACUTE ONLY): 48 min  Charges:  $Gait Training: 8-22 mins $Therapeutic Activity: 23-37 mins                      Arby Barrette, PT Pager [phone]    Rexanne Mano 10/26/2020, 4:27 PM

## 2020-10-27 LAB — GLUCOSE, CAPILLARY
Glucose-Capillary: 118 mg/dL — ABNORMAL HIGH (ref 70–99)
Glucose-Capillary: 144 mg/dL — ABNORMAL HIGH (ref 70–99)
Glucose-Capillary: 121 mg/dL — ABNORMAL HIGH (ref 70–99)
Glucose-Capillary: 125 mg/dL — ABNORMAL HIGH (ref 70–99)
Glucose-Capillary: 137 mg/dL — ABNORMAL HIGH (ref 70–99)

## 2020-10-27 LAB — BASIC METABOLIC PANEL
Anion gap: 9 (ref 5–15)
BUN: 20 mg/dL (ref 8–23)
CO2: 24 mmol/L (ref 22–32)
Calcium: 8.8 mg/dL — ABNORMAL LOW (ref 8.9–10.3)
Chloride: 112 mmol/L — ABNORMAL HIGH (ref 98–111)
Creatinine, Ser: 1.4 mg/dL — ABNORMAL HIGH (ref 0.44–1.00)
GFR, Estimated: 41 mL/min — ABNORMAL LOW (ref >60)
Glucose, Bld: 125 mg/dL — ABNORMAL HIGH (ref 70–99)
Potassium: 3.7 mmol/L (ref 3.5–5.1)
Sodium: 145 mmol/L (ref 135–145)

## 2020-10-27 LAB — CBC
HCT: 25.7 % — ABNORMAL LOW (ref 36.0–46.0)
Hemoglobin: 8.1 g/dL — ABNORMAL LOW (ref 12.0–15.0)
MCH: 29.8 pg (ref 26.0–34.0)
MCHC: 31.5 g/dL (ref 30.0–36.0)
MCV: 94.5 fL (ref 80.0–100.0)
Platelets: 241 10*3/uL (ref 150–400)
RBC: 2.72 MIL/uL — ABNORMAL LOW (ref 3.87–5.11)
RDW: 13.5 % (ref 11.5–15.5)
WBC: 13.1 10*3/uL — ABNORMAL HIGH (ref 4.0–10.5)
nRBC: 0.2 % (ref 0.0–0.2)

## 2020-10-27 LAB — MAGNESIUM: Magnesium: 2 mg/dL (ref 1.7–2.4)

## 2020-10-27 LAB — SURGICAL PATHOLOGY

## 2020-10-27 MED ORDER — METOPROLOL TARTRATE 5 MG/5ML IV SOLN
5.0000 mg | Freq: Four times a day (QID) | INTRAVENOUS | Status: DC | PRN
  Administered 2020-10-28: 5 mg via INTRAVENOUS
  Filled 2020-10-27: qty 5

## 2020-10-27 MED ORDER — ACETAMINOPHEN 10 MG/ML IV SOLN
1000.0000 mg | Freq: Four times a day (QID) | INTRAVENOUS | Status: DC
  Administered 2020-10-27 (×2): 1000 mg via INTRAVENOUS
  Filled 2020-10-27 (×4): qty 100

## 2020-10-27 MED ORDER — IRBESARTAN 300 MG PO TABS: 150.0000 mg | ORAL_TABLET | Freq: Every day | ORAL | Status: DC

## 2020-10-27 NOTE — Care Management Important Message (Signed)
Important Message  Patient Details  Name: Stephanie [name] MRN: [id number] Date of Birth: 12/05/1955   Medicare Important Message Given:  Yes     Orbie Pyo 10/27/2020, 3:26 PM

## 2020-10-27 NOTE — Progress Notes (Signed)
Patient ID: Stephanie [name], female   DOB: November 10, 1955, 65 y.o.   MRN: [id number]  [facility] Surgery Progress Note  4 Days Post-Op  Subjective: CC-  In good spirits. Abdomen sore but pain well controlled. Denies any current n/v. Passing some flatus, no BM. Ambulated around room yesterday.   Objective: Vital signs in last 24 hours: Temp:  [98.4 F (36.9 C)-98.6 F (37 C)] 98.4 F (36.9 C) (10/04 0741) Pulse Rate:  [99-105] 105 (10/04 0741) Resp:  [15-18] 15 (10/04 0741) BP: (157-173)/(69-96) 158/96 (10/04 0741) SpO2:  [91 %-100 %] 91 % (10/04 0741) Last BM Date: 10/22/20  Intake/Output from previous day: 10/03 0701 - 10/04 0700 In: 6233.6 [P.O.:240; I.V.:4993.6; IV Piggyback:1000] Out: 2995 [Urine:2100; Emesis/NG output:850; Drains:45] Intake/Output this shift: Total I/O In: -  Out: 500 [Urine:500]  PE: Gen:  Alert, NAD, pleasant Abd: obese, soft, mild upper abdominal distension, few BS heard, midline incision cdi with staples present and no erythema or drainage, JP with minimal serosanguinous fluid in bulb  Lab Results:  Recent Labs    10/25/20 0028  WBC 10.1  HGB 8.9*  HCT 28.0*  PLT 207   BMET Recent Labs    10/25/20 0028  NA 139  K 4.3  CL 109  CO2 21*  GLUCOSE 135*  BUN 31*  CREATININE 1.84*  CALCIUM 8.2*   PT/INR No results for input(s): LABPROT, INR in the last 72 hours. CMP     Component Value Date/Time   NA 139 10/25/2020 0028   K 4.3 10/25/2020 0028   CL 109 10/25/2020 0028   CO2 21 (L) 10/25/2020 0028   GLUCOSE 135 (H) 10/25/2020 0028   BUN 31 (H) 10/25/2020 0028   CREATININE 1.84 (H) 10/25/2020 0028   CALCIUM 8.2 (L) 10/25/2020 0028   PROT 7.7 10/22/2020 1213   ALBUMIN 3.7 10/22/2020 1213   AST 17 10/22/2020 1213   ALT 19 10/22/2020 1213   ALKPHOS 52 10/22/2020 1213   BILITOT 1.0 10/22/2020 1213   GFRNONAA 30 (L) 10/25/2020 0028   GFRAA 36 (L) 09/18/2019 1507   Lipase     Component Value Date/Time   LIPASE 48 10/22/2020  1213       Studies/Results: No results found.  Anti-infectives: Anti-infectives (From admission, onward)    Start     Dose/Rate Route Frequency Ordered Stop   10/23/20 0830  ceFAZolin (ANCEF) IVPB 2g/100 mL premix        2 g 200 mL/hr over 30 Minutes Intravenous On call to O.R. 10/23/20 0829 10/23/20 1315   10/23/20 0800  cefoTEtan (CEFOTAN) 2 g in sodium chloride 0.9 % 100 mL IVPB  Status:  Discontinued        2 g 200 mL/hr over 30 Minutes Intravenous To [facility] 10/23/20 [id number] 10/23/20 [id number]        Assessment/Plan POD#4 S/p small bowel resection with anastomosis, incisional hernia repair with mesh 9/30 Dr. Kieth Brightly - Starting to pass flatus. NG tube output fairly high (850cc) but she is taking in several ice chips. Will clamp NG today. If patient becomes nauseated or vomits then return NG to LIWS. She has had nothing to eat/drink for 6 days, if she does not tolerate clamping trial and we cannot advance diet soon will need to consider TPN - continue JP and monitor output, currently serosanguinous - mobilize, PT on board (no PT follow up at discharge) - Cont scheduled IV robaxin and IV tylenol for better pain control - binder to help with  pain with ambulation - continue BID PPI for bloody NGT drainage - check CBC and electrolytes today   ID - periop FEN - IVF, clamp NG, ice chips VTE - sq heparin, SCDs Foley - d/c 10/1 and voiding   HTN HLD DM  Obesity BMI 41.85   LOS: 3 days    [name], [facility] Surgery 10/27/2020, 12:23 PM Please see Amion for pager number during day hours 7:00am-4:30pm

## 2020-10-28 LAB — CBC
HCT: 26.1 % — ABNORMAL LOW (ref 36.0–46.0)
Hemoglobin: 8.1 g/dL — ABNORMAL LOW (ref 12.0–15.0)
MCH: 29.2 pg (ref 26.0–34.0)
MCHC: 31 g/dL (ref 30.0–36.0)
MCV: 94.2 fL (ref 80.0–100.0)
Platelets: 257 10*3/uL (ref 150–400)
RBC: 2.77 MIL/uL — ABNORMAL LOW (ref 3.87–5.11)
RDW: 13.6 % (ref 11.5–15.5)
WBC: 12.8 10*3/uL — ABNORMAL HIGH (ref 4.0–10.5)
nRBC: 0.2 % (ref 0.0–0.2)

## 2020-10-28 LAB — GLUCOSE, CAPILLARY
Glucose-Capillary: 116 mg/dL — ABNORMAL HIGH (ref 70–99)
Glucose-Capillary: 124 mg/dL — ABNORMAL HIGH (ref 70–99)
Glucose-Capillary: 127 mg/dL — ABNORMAL HIGH (ref 70–99)
Glucose-Capillary: 133 mg/dL — ABNORMAL HIGH (ref 70–99)
Glucose-Capillary: 146 mg/dL — ABNORMAL HIGH (ref 70–99)
Glucose-Capillary: 159 mg/dL — ABNORMAL HIGH (ref 70–99)
Glucose-Capillary: 187 mg/dL — ABNORMAL HIGH (ref 70–99)

## 2020-10-28 MED ORDER — TRAMADOL HCL 50 MG PO TABS
50.0000 mg | ORAL_TABLET | Freq: Four times a day (QID) | ORAL | Status: DC | PRN
  Administered 2020-10-29 – 2020-10-30 (×3): 50 mg via ORAL
  Filled 2020-10-28 (×4): qty 1

## 2020-10-28 MED ORDER — ACETAMINOPHEN 325 MG PO TABS
650.0000 mg | ORAL_TABLET | Freq: Four times a day (QID) | ORAL | Status: DC | PRN
  Administered 2020-10-28 – 2020-10-31 (×6): 650 mg via ORAL
  Filled 2020-10-28 (×7): qty 2

## 2020-10-28 MED ORDER — ACETAMINOPHEN 10 MG/ML IV SOLN
1000.0000 mg | Freq: Four times a day (QID) | INTRAVENOUS | Status: AC
  Administered 2020-10-28 (×2): 1000 mg via INTRAVENOUS
  Filled 2020-10-28 (×2): qty 100

## 2020-10-28 NOTE — Progress Notes (Signed)
Physical Therapy Treatment Patient Details Name: Stephanie [name] MRN: [id number] DOB: 06/27/1955 Today's Date: 10/28/2020   History of Present Illness 65 year old female a longstanding ventral incisional hernia who presented to the ED 10/23/20 with nausea and worsening pain at the hernia. 9/30 open hernia repair with small bowel resection and anastomosis    PT Comments    Patient in good spirits after having NG tube removed. Ambulated 200 ft with Rw with minguard assist.    Recommendations for follow up therapy are one component of a multi-disciplinary discharge planning process, led by the attending physician.  Recommendations may be updated based on patient status, additional functional criteria and insurance authorization.  Follow Up Recommendations  No PT follow up;Supervision for mobility/OOB (24 yo grandson available 24/7)     Equipment Recommendations  None recommended by PT    Recommendations for Other Services       Precautions / Restrictions Precautions Precautions: Fall;Other (comment) Precaution Comments: JP drain Required Braces or Orthoses: Other Brace Other Brace: abd binder for comfort Restrictions Weight Bearing Restrictions: No     Mobility  Bed Mobility Overal bed mobility: Needs Assistance Bed Mobility: Rolling;Sidelying to Sit Rolling: Supervision Sidelying to sit: HOB elevated;Supervision (HOB 20)       General bed mobility comments: no cues needed; +use of rail    Transfers Overall transfer level: Needs assistance Equipment used: Rolling walker (2 wheeled) Transfers: Sit to/from Stand Sit to Stand: Supervision            Ambulation/Gait Ambulation/Gait assistance: Min guard Gait Distance (Feet): 200 Feet Assistive device: Rolling walker (2 wheeled) Gait Pattern/deviations: Step-through pattern   Gait velocity interpretation: 1.31 - 2.62 ft/sec, indicative of limited community ambulator General Gait Details: moving more briskly and  out into hall today   Stairs             Wheelchair Mobility    Modified Rankin (Stroke Patients Only)       Balance Overall balance assessment: No apparent balance deficits (not formally assessed)                                          Cognition Arousal/Alertness: Awake/alert Behavior During Therapy: WFL for tasks assessed/performed Overall Cognitive Status: Within Functional Limits for tasks assessed                                        Exercises      General Comments        Pertinent Vitals/Pain Pain Assessment: 0-10 Pain Score: 8  Pain Location: abd Pain Descriptors / Indicators: Guarding;Operative site guarding Pain Intervention(s): Limited activity within patient's tolerance;Monitored during session;Other (comment) (applied abd binder)    Home Living                      Prior Function            PT Goals (current goals can now be found in the care plan section) Acute Rehab PT Goals Patient Stated Goal: hurt less PT Goal Formulation: With patient Time For Goal Achievement: 11/07/20 Potential to Achieve Goals: Good Progress towards PT goals: Progressing toward goals    Frequency    Min 3X/week      PT Plan Current plan remains appropriate  Co-evaluation              AM-PAC PT "6 Clicks" Mobility   Outcome Measure  Help needed turning from your back to your side while in a flat bed without using bedrails?: None Help needed moving from lying on your back to sitting on the side of a flat bed without using bedrails?: A Little Help needed moving to and from a bed to a chair (including a wheelchair)?: A Little Help needed standing up from a chair using your arms (e.g., wheelchair or bedside chair)?: A Little Help needed to walk in hospital room?: A Little Help needed climbing 3-5 steps with a railing? : A Little 6 Click Score: 19    End of Session   Activity Tolerance: Patient  tolerated treatment well Patient left: in chair;with call bell/phone within reach (no alarm as pt a&O with RN in agreement) Nurse Communication: Mobility status;Other (comment) (recommend use BSC and not purewick) PT Visit Diagnosis: Unsteadiness on feet (R26.81)     Time: [id number] PT Time Calculation (min) (ACUTE ONLY): 28 min  Charges:  $Gait Training: 23-37 mins                      Arby Barrette, PT Pager [phone]    Rexanne Mano 10/28/2020, 11:35 AM

## 2020-10-28 NOTE — Progress Notes (Signed)
PT Cancellation Note  Patient Details Name: Stephanie [name] MRN: [id number] DOB: [birth year]   Cancelled Treatment:    Reason Eval/Treat Not Completed: Other (comment)  Patient reported NG tube coming out this morning and wants to wait to do therapy until after it's out.   Arby Barrette, PT Pager [phone]   Rexanne Mano 10/28/2020, 9:50 AM

## 2020-10-28 NOTE — TOC Initial Note (Signed)
Transition of Care [facility]) - Initial/Assessment Note    Patient Details  Name: Stephanie [name] MRN: [id number] Date of Birth: [birth year]  Transition of Care [facility]) CM/SW Contact:    Carles Collet, RN Phone Number: 10/28/2020, 11:24 AM  Clinical Narrative:        Patient admitted from home w spouse, independent prior to admission.    5 days post op from small bowel resection. Continues w NGT. Anticipate DC to home when able to tolerate PO. No TOC needs identified at this time.           Expected Discharge Plan: Home/Self Care Barriers to Discharge: Continued Medical Work up   Patient Goals and CMS Choice        Expected Discharge Plan and Services Expected Discharge Plan: Home/Self Care                                              Prior Living Arrangements/Services   Lives with:: Spouse                   Activities of Daily Living      Permission Sought/Granted                  Emotional Assessment              Admission diagnosis:  Incarcerated hernia [K46.0] Ventral incisional hernia [K43.2] Patient Active Problem List   Diagnosis Date Noted   Ventral incisional hernia 10/22/2020   Uncontrolled type 2 diabetes mellitus with hyperglycemia, with long-term current use of insulin ([place]) 12/07/2016   Diabetes type 2, uncontrolled 10/28/2013   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 [place]. [place] [address] Phone: [phone] Fax: [phone]     Social Determinants of Health (SDOH) Interventions    Readmission Risk Interventions No flowsheet data  found.

## 2020-10-28 NOTE — Progress Notes (Signed)
Progress Note  5 Days Post-Op  Subjective: Patient reports NGT has been clamped since yesterday and she has not had any nausea. She is passing flatus. She wants to get up and walk in the halls today. Throat hurts from NGT.   Objective: Vital signs in last 24 hours: Temp:  [98.3 F (36.8 C)-99.3 F (37.4 C)] 98.5 F (36.9 C) (10/05 0735) Pulse Rate:  [97-100] 97 (10/05 0735) Resp:  [17-20] 17 (10/05 0735) BP: (161-177)/(72-90) 168/77 (10/05 0735) SpO2:  [93 %-99 %] 97 % (10/05 0735) Last BM Date: 10/22/20  Intake/Output from previous day: 10/04 0701 - 10/05 0700 In: 90 [P.O.:90] Out: 3663 [Urine:3400; Emesis/NG output:250; Drains:13] Intake/Output this shift: Total I/O In: -  Out: 450 [Urine:450]  PE: Gen:  Alert, NAD, pleasant Abd: obese, soft, appropriately tender, midline incision cdi with staples present and no erythema or drainage, JP with minimal serosanguinous fluid in bulb, NGT clamped    Lab Results:  Recent Labs    10/27/20 1239 10/28/20 0117  WBC 13.1* 12.8*  HGB 8.1* 8.1*  HCT 25.7* 26.1*  PLT 241 257   BMET Recent Labs    10/27/20 1239  NA 145  K 3.7  CL 112*  CO2 24  GLUCOSE 125*  BUN 20  CREATININE 1.40*  CALCIUM 8.8*   PT/INR No results for input(s): LABPROT, INR in the last 72 hours. CMP     Component Value Date/Time   NA 145 10/27/2020 1239   K 3.7 10/27/2020 1239   CL 112 (H) 10/27/2020 1239   CO2 24 10/27/2020 1239   GLUCOSE 125 (H) 10/27/2020 1239   BUN 20 10/27/2020 1239   CREATININE 1.40 (H) 10/27/2020 1239   CALCIUM 8.8 (L) 10/27/2020 1239   PROT 7.7 10/22/2020 1213   ALBUMIN 3.7 10/22/2020 1213   AST 17 10/22/2020 1213   ALT 19 10/22/2020 1213   ALKPHOS 52 10/22/2020 1213   BILITOT 1.0 10/22/2020 1213   GFRNONAA 41 (L) 10/27/2020 1239   GFRAA 36 (L) 09/18/2019 1507   Lipase     Component Value Date/Time   LIPASE 48 10/22/2020 1213       Studies/Results: No results  found.  Anti-infectives: Anti-infectives (From admission, onward)    Start     Dose/Rate Route Frequency Ordered Stop   10/23/20 0830  ceFAZolin (ANCEF) IVPB 2g/100 mL premix        2 g 200 mL/hr over 30 Minutes Intravenous On call to O.R. 10/23/20 0829 10/23/20 1315   10/23/20 0800  cefoTEtan (CEFOTAN) 2 g in sodium chloride 0.9 % 100 mL IVPB  Status:  Discontinued        2 g 200 mL/hr over 30 Minutes Intravenous To [facility] 10/23/20 [id number] 10/23/20 [id number]        Assessment/Plan POD#5 S/p small bowel resection with anastomosis, incisional hernia repair with mesh 9/30 Dr. Kieth Brightly - NGT clamped since yesterday and patient tolerating ice chips. Passing flatus  - continue JP and monitor output, currently serosanguinous - mobilize, PT on board (no PT follow up at discharge) - Cont scheduled IV robaxin, added PO tylenol and tramadol  - binder to help with pain with ambulation - continue BID PPI for bloody NGT drainage - monitor WBC   ID - periop FEN - IVF, CLD VTE - sq heparin, SCDs Foley - d/c 10/1 and voiding   HTN HLD DM  Obesity BMI 41.85  LOS: 4 days    [name], [facility] Surgery  10/28/2020, 10:21 AM Please see Amion for pager number during day hours 7:00am-4:30pm

## 2020-10-29 LAB — GLUCOSE, CAPILLARY
Glucose-Capillary: 108 mg/dL — ABNORMAL HIGH (ref 70–99)
Glucose-Capillary: 130 mg/dL — ABNORMAL HIGH (ref 70–99)
Glucose-Capillary: 118 mg/dL — ABNORMAL HIGH (ref 70–99)
Glucose-Capillary: 241 mg/dL — ABNORMAL HIGH (ref 70–99)
Glucose-Capillary: 166 mg/dL — ABNORMAL HIGH (ref 70–99)

## 2020-10-29 LAB — CBC
HCT: 25.1 % — ABNORMAL LOW (ref 36.0–46.0)
Hemoglobin: 7.8 g/dL — ABNORMAL LOW (ref 12.0–15.0)
MCH: 29.2 pg (ref 26.0–34.0)
MCHC: 31.1 g/dL (ref 30.0–36.0)
MCV: 94 fL (ref 80.0–100.0)
Platelets: 272 10*3/uL (ref 150–400)
RBC: 2.67 MIL/uL — ABNORMAL LOW (ref 3.87–5.11)
RDW: 13.8 % (ref 11.5–15.5)
WBC: 12.5 10*3/uL — ABNORMAL HIGH (ref 4.0–10.5)
nRBC: 0.7 % — ABNORMAL HIGH (ref 0.0–0.2)

## 2020-10-29 MED ORDER — PROSOURCE PLUS PO LIQD
30.0000 mL | Freq: Three times a day (TID) | ORAL | Status: DC
  Administered 2020-10-29 – 2020-10-30 (×2): 30 mL via ORAL
  Filled 2020-10-29 (×3): qty 30

## 2020-10-29 MED ORDER — INSULIN ASPART 100 UNIT/ML IJ SOLN
0.0000 [IU] | Freq: Three times a day (TID) | INTRAMUSCULAR | Status: DC
  Administered 2020-10-29 – 2020-10-30 (×2): 7 [IU] via SUBCUTANEOUS
  Administered 2020-10-30 – 2020-10-31 (×3): 4 [IU] via SUBCUTANEOUS
  Administered 2020-10-31: 3 [IU] via SUBCUTANEOUS

## 2020-10-29 MED ORDER — VALSARTAN 80 MG PO TABS
160.0000 mg | ORAL_TABLET | Freq: Every day | ORAL | Status: DC
  Administered 2020-10-29 – 2020-10-31 (×3): 160 mg via ORAL
  Filled 2020-10-29 (×2): qty 2
  Filled 2020-10-29: qty 1
  Filled 2020-10-29: qty 2

## 2020-10-29 MED ORDER — POLYETHYLENE GLYCOL 3350 17 G PO PACK
17.0000 g | PACK | Freq: Every day | ORAL | Status: DC
  Administered 2020-10-29: 17 g via ORAL
  Filled 2020-10-29 (×2): qty 1

## 2020-10-29 MED ORDER — GLUCERNA SHAKE PO LIQD: 237.0000 mL | Freq: Three times a day (TID) | ORAL | Status: DC

## 2020-10-29 MED ORDER — METHOCARBAMOL 500 MG PO TABS
500.0000 mg | ORAL_TABLET | Freq: Three times a day (TID) | ORAL | Status: DC
  Administered 2020-10-29 – 2020-10-31 (×7): 500 mg via ORAL
  Filled 2020-10-29 (×6): qty 1

## 2020-10-29 MED ORDER — BOOST / RESOURCE BREEZE PO LIQD CUSTOM
1.0000 | Freq: Three times a day (TID) | ORAL | Status: DC
Start: 2020-10-29 — End: 2020-10-30
  Administered 2020-10-29 – 2020-10-30 (×2): 1 via ORAL

## 2020-10-29 MED ORDER — ADULT MULTIVITAMIN W/MINERALS CH
1.0000 | ORAL_TABLET | Freq: Every day | ORAL | Status: DC
  Administered 2020-10-29 – 2020-10-31 (×3): 1 via ORAL
  Filled 2020-10-29 (×4): qty 1

## 2020-10-29 MED ORDER — DOCUSATE SODIUM 100 MG PO CAPS
100.0000 mg | ORAL_CAPSULE | Freq: Two times a day (BID) | ORAL | Status: DC
  Administered 2020-10-29: 100 mg via ORAL
  Filled 2020-10-29 (×4): qty 1

## 2020-10-29 NOTE — Discharge Instructions (Signed)
[facility] _______[facility]  UMBILICAL OR INGUINAL HERNIA REPAIR: POST OP INSTRUCTIONS  Always review your discharge instruction sheet given to you by the facility where your surgery was performed. IF YOU HAVE DISABILITY OR FAMILY LEAVE FORMS, YOU MUST BRING THEM TO THE OFFICE FOR PROCESSING.   DO NOT GIVE THEM TO YOUR DOCTOR.  1. A  prescription for pain medication may be given to you upon discharge.  Take your pain medication as prescribed, if needed.  If narcotic pain medicine is not needed, then you may take acetaminophen (Tylenol) or ibuprofen (Advil) as needed. 2. Take your usually prescribed medications unless otherwise directed. If you need a refill on your pain medication, please contact your pharmacy.  They will contact our office to request authorization. Prescriptions will not be filled after 5 pm or on week-ends. 3. You should follow a light diet the first 24 hours after arrival home, such as soup and crackers, etc.  Be sure to include lots of fluids daily.  Resume your normal diet the day after surgery. 4.Most patients will experience some swelling and bruising around the umbilicus or in the groin and scrotum.  Ice packs and reclining will help.  Swelling and bruising can take several days to resolve.  6. It is common to experience some constipation if taking pain medication after surgery.  Increasing fluid intake and taking a stool softener (such as Colace) will usually help or prevent this problem from occurring.  A mild laxative (Milk of Magnesia or Miralax) should be taken according to package directions if there are no bowel movements after 48 hours. 7. Unless discharge instructions indicate otherwise, you may remove your bandages 24-48 hours after surgery, and you may shower at that time.  You may have steri-strips (small skin tapes) in place directly over the incision.  These strips should be left on the skin for 7-10 days.  If your surgeon used skin glue on the  incision, you may shower in 24 hours.  The glue will flake off over the next 2-3 weeks.  Any sutures or staples will be removed at the office during your follow-up visit. 8. ACTIVITIES:  You may resume regular (light) daily activities beginning the next day--such as daily self-care, walking, climbing stairs--gradually increasing activities as tolerated.  You may have sexual intercourse when it is comfortable.  Refrain from any heavy lifting or straining until approved by your doctor.  a.You may drive when you are no longer taking prescription pain medication, you can comfortably wear a seatbelt, and you can safely maneuver your car and apply brakes.   9.You should see your doctor in the office for a follow-up appointment approximately 2-3 weeks after your surgery.  Make sure that you call for this appointment within a day or two after you arrive home to insure a convenient appointment time.   WHEN TO CALL YOUR DOCTOR: Fever over 101.0 Inability to urinate Nausea and/or vomiting Extreme swelling or bruising Continued bleeding from incision. Increased pain, redness, or drainage from the incision  The clinic staff is available to answer your questions during regular business hours.  Please don't hesitate to call and ask to speak to one of the nurses for clinical concerns.  If you have a medical emergency, go to the nearest emergency room or call 911.  A surgeon from [facility] is always on call at the hospital   [address], [address], [place], [place]  [address] ?  [address] [address], [place], [place]     [address] [phone] ? [phone] ? FAX [phone] Web site: [email or web address]  

## 2020-10-29 NOTE — Progress Notes (Signed)
Progress Note  6 Days Post-Op  Subjective: Patient reports headaches this AM. She has been mobilizing well. She is passing flatus but only occasionally and no BM. She denies nausea or vomiting. She does feel a little bloated.   Objective: Vital signs in last 24 hours: Temp:  [98.4 F (36.9 C)-99.2 F (37.3 C)] 98.6 F (37 C) (10/06 0743) Pulse Rate:  [82-94] 85 (10/06 0839) Resp:  [16-18] 17 (10/06 0743) BP: (139-180)/(61-82) 170/82 (10/06 0839) SpO2:  [94 %-98 %] 95 % (10/06 0743) Weight:  [109.2 kg] 109.2 kg (10/05 1700) Last BM Date: 10/22/20  Intake/Output from previous day: 10/05 0701 - 10/06 0700 In: 3496.7 [I.V.:3241.7; IV Piggyback:250] Out: 1310 [Urine:1300; Drains:10] Intake/Output this shift: Total I/O In: 370 [P.O.:120; I.V.:250] Out: -   PE: Gen:  Alert, NAD, pleasant Abd: obese, soft, appropriately tender, midline incision cdi with staples present and no erythema or drainage, JP with minimal serosanguinous fluid in bulb   Lab Results:  Recent Labs    10/28/20 0117 10/29/20 0129  WBC 12.8* 12.5*  HGB 8.1* 7.8*  HCT 26.1* 25.1*  PLT 257 272   BMET Recent Labs    10/27/20 1239  NA 145  K 3.7  CL 112*  CO2 24  GLUCOSE 125*  BUN 20  CREATININE 1.40*  CALCIUM 8.8*   PT/INR No results for input(s): LABPROT, INR in the last 72 hours. CMP     Component Value Date/Time   NA 145 10/27/2020 1239   K 3.7 10/27/2020 1239   CL 112 (H) 10/27/2020 1239   CO2 24 10/27/2020 1239   GLUCOSE 125 (H) 10/27/2020 1239   BUN 20 10/27/2020 1239   CREATININE 1.40 (H) 10/27/2020 1239   CALCIUM 8.8 (L) 10/27/2020 1239   PROT 7.7 10/22/2020 1213   ALBUMIN 3.7 10/22/2020 1213   AST 17 10/22/2020 1213   ALT 19 10/22/2020 1213   ALKPHOS 52 10/22/2020 1213   BILITOT 1.0 10/22/2020 1213   GFRNONAA 41 (L) 10/27/2020 1239   GFRAA 36 (L) 09/18/2019 1507   Lipase     Component Value Date/Time   LIPASE 48 10/22/2020 1213       Studies/Results: No  results found.  Anti-infectives: Anti-infectives (From admission, onward)    Start     Dose/Rate Route Frequency Ordered Stop   10/23/20 0830  ceFAZolin (ANCEF) IVPB 2g/100 mL premix        2 g 200 mL/hr over 30 Minutes Intravenous On call to O.R. 10/23/20 0829 10/23/20 1315   10/23/20 0800  cefoTEtan (CEFOTAN) 2 g in sodium chloride 0.9 % 100 mL IVPB  Status:  Discontinued        2 g 200 mL/hr over 30 Minutes Intravenous To ShortStay Surgical 10/23/20 0635 10/23/20 0852        Assessment/Plan POD#6 S/p small bowel resection with anastomosis, incisional hernia repair with mesh 9/30 Dr. Kieth Brightly - tolerating CLD but only passing a little flatus and is a little distended this AM, will keep on CLD today  - continue JP and monitor output, currently serosanguinous - mobilize, PT on board (no PT follow up at discharge) - binder to help with pain with ambulation - continue BID PPI  - monitor WBC   ID - periop FEN - decreased IVF to 50 cc/h, CLD, glucerna  VTE - sq heparin, SCDs Foley - d/c 10/1 and voiding   HTN - will reorder home meds HLD DM  Obesity BMI 41.85  LOS: 5 days  [name], [facility] 10/29/2020, 9:15 AM Please see Amion for pager number during day hours 7:00am-4:30pm

## 2020-10-29 NOTE — Progress Notes (Signed)
Initial Nutrition Assessment  DOCUMENTATION CODES:   Morbid obesity  INTERVENTION:   -D/c Glucerna  -Boost Breeze po TID, each supplement provides 250 kcal and 9 grams of protein  -30 ml Prosource Plus TID, each supplement provides 100 kcals and 15 grams protein -MVI with minerals daily -RD will follow for diet advancement and adjust supplement regimen as appropriate  NUTRITION DIAGNOSIS:   Increased nutrient needs related to post-op healing as evidenced by estimated needs.  GOAL:   Patient will meet greater than or equal to 90% of their needs  MONITOR:   PO intake, Supplement acceptance, Diet advancement, Labs, Weight trends, Skin, I & O's  REASON FOR ASSESSMENT:   NPO/Clear Liquid Diet    ASSESSMENT:   Stephanie [name] is a 65 year old female a longstanding ventral incisional hernia who presented to the ED today with nausea and worsening pain at the hernia.  The hernia has been present for about 6 years, but is enlarged in the last few months and in the last few weeks has caused her more pain.  She was first seen in the ED on 09/28/2020 with pain and vomiting.  A CT scan at that time showed colon within the hernia as well as thickening and edema of adjacent loops of small bowel.  She tolerated p.o. challenge and followed up as an outpatient with Dr. Ninfa Linden on 9/14.  She has been scheduled for elective repair of the hernia on 10/14.  Pt admitted with chronic incarcerated ventral hernia.   9/30- s/p  Procedure: small bowel resection with anastomosis and incisional hernia repair with mesh 10/5- NGT d/c, advanced to clear liquid diet  Reviewed I/O's: +2.2 L x 24 hours and +2.8 L since admission  UOP: 1.3 L x 24 hours  Drain output: 10 ml x 24 hours  Per general surgery notes, plan to continue on clear liquid diet today due to lack of flatus and BM.   Spoke with pt, who was sitting in recliner chair at time of visit. She reports that she is tolerating clear liquids well, but has  not eaten anything today ("everything is so sweet and runs my sugar up"). Pt is very happy to have NGT out and is eager to advance to solid food. RD reviewed rationale for clear liquids and potential for diet advancement.   Pt repots good appetite PTA. She usually consumes 2 meals per day (Breakfast: "green juice" made with lemon, kale, ginger, and green apples; Dinner: meat, greens, and another green juice). Pt also drinks water and occasional fruit juice.   Reviewed wt hx; wt has been stable over the past year. Pt reports her UBW is around 22# and she had been trying to lose weight PTA via lifestyle modifications.   Medications reviewed and include colace, miralax, and 0.9% sodium chloride infusion @ 50 ml/hr.   Lab Results  Component Value Date   HGBA1C 6.5 09/10/2020   PTA DM medications are 100 mg metformin daily, 24 units tresiba daily, and 0.25 semaglutide weekly. Pt reports good glycemic control at home- CBGS typically range in the 130's.   Discussed importance of good meal and supplement intake. Pt amenable to supplements.   Labs reviewed: CBGS: 108-187 (inpatient orders for glycemic control are 0-20 units insulin aspart TID with meals and 12 units insulin detemir daily).    NUTRITION - FOCUSED PHYSICAL EXAM:  Flowsheet Row Most Recent Value  Orbital Region No depletion  Upper Arm Region No depletion  Thoracic and Lumbar Region No depletion  Buccal Region No depletion  Temple Region No depletion  Clavicle Bone Region No depletion  Clavicle and Acromion Bone Region No depletion  Scapular Bone Region No depletion  Dorsal Hand No depletion  Patellar Region No depletion  Anterior Thigh Region No depletion  Posterior Calf Region No depletion  Edema (RD Assessment) None  Hair Reviewed  Eyes Reviewed  Mouth Reviewed  Skin Reviewed  Nails Reviewed       Diet Order:   Diet Order             Diet clear liquid Room service appropriate? Yes; Fluid consistency: Thin  Diet  effective now                   EDUCATION NEEDS:   Education needs have been addressed  Skin:  Skin Assessment: Skin Integrity Issues: Skin Integrity Issues:: Incisions Incisions: closed abdomen  Last BM:  10/22/20  Height:   Ht Readings from Last 1 Encounters:  10/29/20 5' 3.5" (1.613 m)    Weight:   Wt Readings from Last 1 Encounters:  10/29/20 109.2 kg    Ideal Body Weight:  53.4 kg  BMI:  Body mass index is 41.98 kg/m.  Estimated Nutritional Needs:   Kcal:  1850-2050  Protein:  105-120 grams  Fluid:  > 1.8 L    Loistine Chance, RD, LDN, [place] Registered Dietitian II Certified Diabetes Care and Education Specialist Please refer to [facility] for RD and/or RD on-call/weekend/after hours pager

## 2020-10-30 LAB — BASIC METABOLIC PANEL
Anion gap: 5 (ref 5–15)
BUN: 13 mg/dL (ref 8–23)
CO2: 24 mmol/L (ref 22–32)
Calcium: 8.5 mg/dL — ABNORMAL LOW (ref 8.9–10.3)
Chloride: 110 mmol/L (ref 98–111)
Creatinine, Ser: 1.21 mg/dL — ABNORMAL HIGH (ref 0.44–1.00)
GFR, Estimated: 49 mL/min — ABNORMAL LOW (ref >60)
Glucose, Bld: 148 mg/dL — ABNORMAL HIGH (ref 70–99)
Potassium: 3.2 mmol/L — ABNORMAL LOW (ref 3.5–5.1)
Sodium: 139 mmol/L (ref 135–145)

## 2020-10-30 LAB — GLUCOSE, CAPILLARY
Glucose-Capillary: 135 mg/dL — ABNORMAL HIGH (ref 70–99)
Glucose-Capillary: 141 mg/dL — ABNORMAL HIGH (ref 70–99)
Glucose-Capillary: 164 mg/dL — ABNORMAL HIGH (ref 70–99)
Glucose-Capillary: 171 mg/dL — ABNORMAL HIGH (ref 70–99)
Glucose-Capillary: 183 mg/dL — ABNORMAL HIGH (ref 70–99)
Glucose-Capillary: 230 mg/dL — ABNORMAL HIGH (ref 70–99)

## 2020-10-30 LAB — CBC
HCT: 24.3 % — ABNORMAL LOW (ref 36.0–46.0)
Hemoglobin: 7.6 g/dL — ABNORMAL LOW (ref 12.0–15.0)
MCH: 29.6 pg (ref 26.0–34.0)
MCHC: 31.3 g/dL (ref 30.0–36.0)
MCV: 94.6 fL (ref 80.0–100.0)
Platelets: 289 10*3/uL (ref 150–400)
RBC: 2.57 MIL/uL — ABNORMAL LOW (ref 3.87–5.11)
RDW: 14 % (ref 11.5–15.5)
WBC: 10.4 10*3/uL (ref 4.0–10.5)
nRBC: 0.7 % — ABNORMAL HIGH (ref 0.0–0.2)

## 2020-10-30 MED ORDER — POTASSIUM CHLORIDE CRYS ER 20 MEQ PO TBCR
60.0000 meq | EXTENDED_RELEASE_TABLET | Freq: Two times a day (BID) | ORAL | Status: AC
  Administered 2020-10-30 (×2): 60 meq via ORAL
  Filled 2020-10-30 (×2): qty 3

## 2020-10-30 MED ORDER — FERROUS SULFATE 325 (65 FE) MG PO TABS
325.0000 mg | ORAL_TABLET | Freq: Three times a day (TID) | ORAL | Status: DC
  Administered 2020-10-30 – 2020-10-31 (×3): 325 mg via ORAL
  Filled 2020-10-30 (×3): qty 1

## 2020-10-30 MED ORDER — GLUCERNA SHAKE PO LIQD
237.0000 mL | Freq: Two times a day (BID) | ORAL | Status: DC
  Administered 2020-10-31 (×2): 237 mL via ORAL

## 2020-10-30 MED ORDER — SODIUM CHLORIDE 0.9% FLUSH
3.0000 mL | INTRAVENOUS | Status: DC | PRN
Start: 2020-10-30 — End: 2020-10-31

## 2020-10-30 MED ORDER — ASCORBIC ACID 500 MG PO TABS
500.0000 mg | ORAL_TABLET | Freq: Three times a day (TID) | ORAL | Status: DC
  Administered 2020-10-30 – 2020-10-31 (×3): 500 mg via ORAL
  Filled 2020-10-30 (×3): qty 1

## 2020-10-30 MED ORDER — POLYETHYLENE GLYCOL 3350 17 G PO PACK: 17.0000 g | PACK | Freq: Every day | ORAL | Status: DC | PRN

## 2020-10-30 MED ORDER — SODIUM CHLORIDE 0.9 % IV SOLN: 250.0000 mL | INTRAVENOUS | Status: DC | PRN

## 2020-10-30 MED ORDER — PANTOPRAZOLE SODIUM 40 MG PO TBEC
40.0000 mg | DELAYED_RELEASE_TABLET | Freq: Every day | ORAL | Status: DC
  Administered 2020-10-30 – 2020-10-31 (×2): 40 mg via ORAL
  Filled 2020-10-30 (×2): qty 1

## 2020-10-30 MED ORDER — ENSURE MAX PROTEIN PO LIQD
11.0000 [oz_av] | Freq: Every day | ORAL | Status: DC
  Administered 2020-10-30: 11 [oz_av] via ORAL
  Filled 2020-10-30 (×2): qty 330

## 2020-10-30 MED ORDER — SODIUM CHLORIDE 0.9% FLUSH
3.0000 mL | Freq: Two times a day (BID) | INTRAVENOUS | Status: DC
  Administered 2020-10-30 – 2020-10-31 (×3): 3 mL via INTRAVENOUS

## 2020-10-30 NOTE — Progress Notes (Signed)
Spoke with triage nurse at [facility], informed nurse that pt's hemoglobin has been trending down, most recent 7.6 and that pt c/o dizziness, lightheadedness, and headache x 2 days. Triage nurse stated she will page the provider for pt and call back with any updates and new orders.

## 2020-10-30 NOTE — Progress Notes (Signed)
Progress Note  7 Days Post-Op  Subjective: Pt reports she is having BMs and more flatus. Upper abdomen still feels bloated. She denies nausea.   Objective: Vital signs in last 24 hours: Temp:  [98.1 F (36.7 C)-98.7 F (37.1 C)] 98.2 F (36.8 C) (10/07 0811) Pulse Rate:  [82-92] 82 (10/07 0811) Resp:  [18-20] 20 (10/07 0811) BP: (140-175)/(59-86) 153/74 (10/07 0811) SpO2:  [96 %-100 %] 96 % (10/07 0811) Last BM Date: 10/29/20  Intake/Output from previous day: 10/06 0701 - 10/07 0700 In: 2001.2 [P.O.:720; I.V.:1281.2] Out: 18 [Drains:18] Intake/Output this shift: No intake/output data recorded.  PE: Gen:  Alert, NAD, pleasant Abd: obese, soft, appropriately tender, mod distention in upper abdomen, midline incision cdi with staples present and no erythema or drainage, JP with minimal serosanguinous fluid in bulb   Lab Results:  Recent Labs    10/28/20 0117 10/29/20 0129  WBC 12.8* 12.5*  HGB 8.1* 7.8*  HCT 26.1* 25.1*  PLT 257 272   BMET Recent Labs    10/27/20 1239  NA 145  K 3.7  CL 112*  CO2 24  GLUCOSE 125*  BUN 20  CREATININE 1.40*  CALCIUM 8.8*   PT/INR No results for input(s): LABPROT, INR in the last 72 hours. CMP     Component Value Date/Time   NA 145 10/27/2020 1239   K 3.7 10/27/2020 1239   CL 112 (H) 10/27/2020 1239   CO2 24 10/27/2020 1239   GLUCOSE 125 (H) 10/27/2020 1239   BUN 20 10/27/2020 1239   CREATININE 1.40 (H) 10/27/2020 1239   CALCIUM 8.8 (L) 10/27/2020 1239   PROT 7.7 10/22/2020 1213   ALBUMIN 3.7 10/22/2020 1213   AST 17 10/22/2020 1213   ALT 19 10/22/2020 1213   ALKPHOS 52 10/22/2020 1213   BILITOT 1.0 10/22/2020 1213   GFRNONAA 41 (L) 10/27/2020 1239   GFRAA 36 (L) 09/18/2019 1507   Lipase     Component Value Date/Time   LIPASE 48 10/22/2020 1213       Studies/Results: No results found.  Anti-infectives: Anti-infectives (From admission, onward)    Start     Dose/Rate Route Frequency Ordered Stop    10/23/20 0830  ceFAZolin (ANCEF) IVPB 2g/100 mL premix        2 g 200 mL/hr over 30 Minutes Intravenous On call to O.R. 10/23/20 0829 10/23/20 1315   10/23/20 0800  cefoTEtan (CEFOTAN) 2 g in sodium chloride 0.9 % 100 mL IVPB  Status:  Discontinued        2 g 200 mL/hr over 30 Minutes Intravenous To ShortStay Surgical 10/23/20 0635 10/23/20 0852        Assessment/Plan POD#7 S/p small bowel resection with anastomosis, incisional hernia repair with mesh 9/30 Dr. Kieth Brightly - tolerating liquids and having bowel function, advanced to soft this AM - continue JP and monitor output, currently serosanguinous - mobilize, PT on board (no PT follow up at discharge) - binder to help with pain with ambulation - continue BID PPI  - monitor WBC - rechecking this AM - possible discharge later today vs tomorrow AM if tolerating diet and labs are ok, will recheck this afternoon    ID - periop FEN - SLIV, soft diet, glucerna  VTE - sq heparin, SCDs Foley - d/c 10/1 and voiding   HTN - home meds HLD DM - SSI Obesity BMI 41.85  LOS: 6 days    [name], [facility] 10/30/2020, 10:16 AM Please see  Amion for pager number during day hours 7:00am-4:30pm

## 2020-10-30 NOTE — Progress Notes (Signed)
Physical Therapy Treatment Patient Details Name: Stephanie [name] MRN: [id number] DOB: Jun 16, 1955 Today's Date: 10/30/2020   History of Present Illness 65 year old female a longstanding ventral incisional hernia who presented to the ED 10/23/20 with nausea and worsening pain at the hernia. 9/30 open hernia repair with small bowel resection and anastomosis    PT Comments    Pt was seen for gait with some HA complaints, nursing notified for meds.   Pt is willing to walk and did cover a short trip on the hall, but then tired and mildly light headed.  Talked with nursing about her hgb, and nursing contacted MD to follow up with symptoms. Continue to work on strength, balance and endurance with gait to restore independence as planned.  Monitor symptoms and keep nursing informed as they affect safety and mobility.  Focus on goals for acute PT.  Recommendations for follow up therapy are one component of a multi-disciplinary discharge planning process, led by the attending physician.  Recommendations may be updated based on patient status, additional functional criteria and insurance authorization.  Follow Up Recommendations  No PT follow up;Supervision for mobility/OOB     Equipment Recommendations  None recommended by PT    Recommendations for Other Services       Precautions / Restrictions Precautions Precautions: Fall;Other (comment) Precaution Comments: JP drain Required Braces or Orthoses: Other Brace Other Brace: abd binder for comfort Restrictions Weight Bearing Restrictions: No     Mobility  Bed Mobility Overal bed mobility: Needs Assistance Bed Mobility: Rolling;Sidelying to Sit Rolling: Supervision Sidelying to sit: HOB elevated;Supervision            Transfers Overall transfer level: Needs assistance Equipment used: Rolling walker (2 wheeled) Transfers: Sit to/from Stand Sit to Stand: Min guard         General transfer comment: pt is cued for hand placemetn on  every standing trial  Ambulation/Gait Ambulation/Gait assistance: Min guard Gait Distance (Feet): 110 Feet Assistive device: Rolling walker (2 wheeled) Gait Pattern/deviations: Step-through pattern Gait velocity: redcued Gait velocity interpretation: <1.31 ft/sec, indicative of household ambulator General Gait Details: pt is walking with wide turns and careful due to abd discomfort but is reporting the brace is helping her symptoms   Stairs             Wheelchair Mobility    Modified Rankin (Stroke Patients Only)       Balance Overall balance assessment: Needs assistance Sitting-balance support: Feet supported Sitting balance-Leahy Scale: Good     Standing balance support: Bilateral upper extremity supported;During functional activity Standing balance-Leahy Scale: Fair Standing balance comment: walker support is helpful for balance and pain management                            Cognition Arousal/Alertness: Awake/alert Behavior During Therapy: WFL for tasks assessed/performed Overall Cognitive Status: Within Functional Limits for tasks assessed                                        Exercises      General Comments General comments (skin integrity, edema, etc.): encouraging pt to use body mechanics to support and manage her pain and weakness      Pertinent Vitals/Pain Pain Assessment: Faces Faces Pain Scale: Hurts little more Pain Location: abdomen and HA Pain Descriptors / Indicators: Guarding;Grimacing Pain Intervention(s): Monitored during session;Premedicated  before session;Repositioned;Other (comment) (binder applied to protect her site)    Home Living                      Prior Function            PT Goals (current goals can now be found in the care plan section) Acute Rehab PT Goals Patient Stated Goal: hurt less Progress towards PT goals: Progressing toward goals    Frequency    Min 3X/week       PT Plan Current plan remains appropriate    Co-evaluation              AM-PAC PT "6 Clicks" Mobility   Outcome Measure  Help needed turning from your back to your side while in a flat bed without using bedrails?: None Help needed moving from lying on your back to sitting on the side of a flat bed without using bedrails?: None Help needed moving to and from a bed to a chair (including a wheelchair)?: A Little Help needed standing up from a chair using your arms (e.g., wheelchair or bedside chair)?: A Little Help needed to walk in hospital room?: A Little Help needed climbing 3-5 steps with a railing? : A Little 6 Click Score: 20    End of Session Equipment Utilized During Treatment: Gait belt Activity Tolerance: Patient tolerated treatment well Patient left: in chair;with call bell/phone within reach Nurse Communication: Mobility status;Other (comment) PT Visit Diagnosis: Unsteadiness on feet (R26.81)     Time: 1310-1346 PT Time Calculation (min) (ACUTE ONLY): 36 min  Charges:  $Gait Training: 8-22 mins $Therapeutic Activity: 8-22 mins          Ramond Dial 10/30/2020, 4:57 PM  Mee Hives, PT PhD Acute Rehab Dept. Number: [place]

## 2020-10-30 NOTE — Progress Notes (Signed)
Nutrition Follow-up  DOCUMENTATION CODES:   Morbid obesity  INTERVENTION:   -D/c Boost Breeze -D/c Prosource -Continue MVI with minerals daily -Glucerna Shake po BID, each supplement provides 220 kcal and 10 grams of protein  -Ensure Max po daily, each supplement provides 150 kcal and 30 grams of protein.    NUTRITION DIAGNOSIS:   Increased nutrient needs related to post-op healing as evidenced by estimated needs.  Ongoing  GOAL:   Patient will meet greater than or equal to 90% of their needs  Progressing   MONITOR:   PO intake, Supplement acceptance, Diet advancement, Labs, Weight trends, Skin, I & O's  REASON FOR ASSESSMENT:   NPO/Clear Liquid Diet    ASSESSMENT:   Stephanie [name] is a 65 year old female a longstanding ventral incisional hernia who presented to the ED today with nausea and worsening pain at the hernia.  The hernia has been present for about 6 years, but is enlarged in the last few months and in the last few weeks has caused her more pain.  She was first seen in the ED on 09/28/2020 with pain and vomiting.  A CT scan at that time showed colon within the hernia as well as thickening and edema of adjacent loops of small bowel.  She tolerated p.o. challenge and followed up as an outpatient with Dr. Ninfa Linden on 9/14.  She has been scheduled for elective repair of the hernia on 10/14.  9/30- s/p  Procedure: small bowel resection with anastomosis and incisional hernia repair with mesh 10/5- NGT d/c, advanced to clear liquid diet 10/7- advanced to full liquid diet, advanced to soft diet  Reviewed I/O's: +2 L x 24 hours and +4.8 L since admission  Drain output: 18 ml x 24 hours  Case discussed with general surgery, who reports that pt has been advanced to full liquid diet with plans to advance to soft diet later on today.   Spoke with pt at bedside, who reports feeling better today. She was eating breakfast- consumed 50% of pudding, 50% of jelly, 100% of juice, and  75% of broth. Pt eating cream of chicken soup during visit. She is tolerating meals well (noted meal completion 50-100%) and remains eager to try solid food. RD reviewed menu with pt.   Medications reviewed and include miralax.   Labs reviewed: K: 3.2, CBGS: 135-183 (inpatient orders for glycemic control are 0-20 units insulin aspart TID with meals and 12 units insulin detemir daily).    Diet Order:   Diet Order             DIET SOFT Room service appropriate? Yes; Fluid consistency: Thin  Diet effective now                   EDUCATION NEEDS:   Education needs have been addressed  Skin:  Skin Assessment: Skin Integrity Issues: Skin Integrity Issues:: Incisions Incisions: closed abdomen  Last BM:  10/29/20  Height:   Ht Readings from Last 1 Encounters:  10/29/20 5' 3.5" (1.613 m)    Weight:   Wt Readings from Last 1 Encounters:  10/29/20 109.2 kg    Ideal Body Weight:  53.4 kg  BMI:  Body mass index is 41.98 kg/m.  Estimated Nutritional Needs:   Kcal:  1850-2050  Protein:  105-120 grams  Fluid:  > 1.8 L    Loistine Chance, RD, LDN, [place] Registered Dietitian II Certified Diabetes Care and Education Specialist Please refer to [facility] for RD and/or RD on-call/weekend/after hours pager

## 2020-10-31 LAB — CBC
HCT: 23.7 % — ABNORMAL LOW (ref 36.0–46.0)
Hemoglobin: 7.3 g/dL — ABNORMAL LOW (ref 12.0–15.0)
MCH: 29.7 pg (ref 26.0–34.0)
MCHC: 30.8 g/dL (ref 30.0–36.0)
MCV: 96.3 fL (ref 80.0–100.0)
Platelets: 295 10*3/uL (ref 150–400)
RBC: 2.46 MIL/uL — ABNORMAL LOW (ref 3.87–5.11)
RDW: 14.1 % (ref 11.5–15.5)
WBC: 10.9 10*3/uL — ABNORMAL HIGH (ref 4.0–10.5)
nRBC: 1.1 % — ABNORMAL HIGH (ref 0.0–0.2)

## 2020-10-31 LAB — GLUCOSE, CAPILLARY
Glucose-Capillary: 118 mg/dL — ABNORMAL HIGH (ref 70–99)
Glucose-Capillary: 130 mg/dL — ABNORMAL HIGH (ref 70–99)
Glucose-Capillary: 130 mg/dL — ABNORMAL HIGH (ref 70–99)
Glucose-Capillary: 155 mg/dL — ABNORMAL HIGH (ref 70–99)

## 2020-10-31 MED ORDER — METHOCARBAMOL 500 MG PO TABS: 500.0000 mg | ORAL_TABLET | Freq: Three times a day (TID) | ORAL | 0 refills | Status: DC | PRN

## 2020-10-31 MED ORDER — TRAMADOL HCL 50 MG PO TABS: 50.0000 mg | ORAL_TABLET | Freq: Four times a day (QID) | ORAL | 0 refills | Status: DC | PRN

## 2020-10-31 MED ORDER — DOCUSATE SODIUM 100 MG PO CAPS: 100.0000 mg | ORAL_CAPSULE | Freq: Two times a day (BID) | ORAL | Status: DC | PRN

## 2020-10-31 MED ORDER — ACETAMINOPHEN 325 MG PO TABS: 650.0000 mg | ORAL_TABLET | Freq: Four times a day (QID) | ORAL | Status: AC | PRN

## 2020-10-31 MED ORDER — POLYETHYLENE GLYCOL 3350 17 G PO PACK: 17.0000 g | PACK | Freq: Every day | ORAL | Status: DC | PRN

## 2020-10-31 MED ORDER — ASCORBIC ACID 500 MG PO TABS: 500.0000 mg | ORAL_TABLET | Freq: Three times a day (TID) | ORAL | 0 refills | Status: AC

## 2020-10-31 MED ORDER — FERROUS SULFATE 325 (65 FE) MG PO TABS: 325.0000 mg | ORAL_TABLET | Freq: Three times a day (TID) | ORAL | 0 refills | Status: AC

## 2020-10-31 NOTE — Progress Notes (Signed)
Pt given discharge instructions & instructed on wound & JP drain care, wound care supplies given to pt.

## 2020-10-31 NOTE — Discharge Summary (Signed)
[facility] Discharge Summary   Patient ID: Stephanie [name] MRN: [id number] DOB/AGE: 67-Nov-1957 67 y.o.  Admit date: 10/22/2020 Discharge date: 10/31/2020  Admitting Diagnosis: Ventral hernia with SBO  Discharge Diagnosis Ventral hernia with SBO s/p repair with small bowel resection   Consultants None   Imaging: No results found.  Procedures Dr. Franky Macho Kinsinger (10/23/20) - Exploratory laparotomy, small bowel resection, incisional hernia repair with mesh  Hospital Course:  Patient is a 65 year old female who presented to [facility] with known ventral hernia and increased abdominal pain.  Workup showed SBO secondary to incisional hernia.  Patient was admitted and underwent procedure listed above.  Tolerated procedure well and was transferred to the floor. Patient developed mild post-operative ileus which improved with bowel rest. Diet was advanced as tolerated.  On POD8, the patient was voiding well, tolerating diet, ambulating well, pain well controlled, vital signs stable, incisions c/d/i and felt stable for discharge home.  Patient will follow up in our office in 2 weeks and knows to call with questions or concerns. She will call to confirm appointment date/time.    Physical Exam: Gen:  Alert, NAD, pleasant Abd: obese, soft, appropriately tender, mod distention in upper abdomen, midline incision cdi with staples present and no erythema or drainage, JP with minimal serosanguinous fluid in bulb  I or a member of my team have reviewed this patient in the Controlled Substance Database.   Allergies as of 10/31/2020       Reactions   Avapro [irbesartan] Other (See Comments)   headaches   Codeine Nausea And Vomiting   Lisinopril Itching        Medication List     TAKE these medications    Accu-Chek FastClix Lancets Misc Use accu chek fastclix lancets to check blood sugar 2-3 times daily. DX:E11.65   Accu-Chek Guide Me w/Device Kit 1 each by Does not apply route 3  (three) times daily. Use accu chek guide me to check blood sugar 2-3 times daily. DX:E11.65   Accu-Chek Guide test strip Generic drug: glucose blood USE 1 STRIP TO CHECK GLUCOSE 2 TO 3 TIMES DAILY   acetaminophen 325 MG tablet Commonly known as: TYLENOL Take 2 tablets (650 mg total) by mouth every 6 (six) hours as needed for mild pain, fever or headache.   allopurinol 100 MG tablet Commonly known as: ZYLOPRIM Take 2 tablets (200 mg total) by mouth daily. For high uric acid What changed: how much to take   ascorbic acid 500 MG tablet Commonly known as: VITAMIN C Take 1 tablet (500 mg total) by mouth 3 (three) times daily.   diclofenac Sodium 1 % Gel Commonly known as: Voltaren Apply 2 g topically 4 (four) times daily.   docusate sodium 100 MG capsule Commonly known as: COLACE Take 1 capsule (100 mg total) by mouth 2 (two) times daily as needed for mild constipation.   ferrous sulfate 325 (65 FE) MG tablet Take 1 tablet (325 mg total) by mouth 3 (three) times daily with meals.   furosemide 40 MG tablet Commonly known as: LASIX Take 1 tablet by mouth once daily   hydrocortisone valerate cream 0.2 % Commonly known as: WESTCORT Apply 1 application topically daily as needed (rash).   labetalol 100 MG tablet Commonly known as: NORMODYNE Take 1 tablet by mouth twice daily   methocarbamol 500 MG tablet Commonly known as: ROBAXIN Take 1 tablet (500 mg total) by mouth every 8 (eight) hours as needed for muscle spasms.  NovoLOG Mix 70/30 FlexPen (70-30) 100 UNIT/ML FlexPen Generic drug: insulin aspart protamine - aspart 10 units before breakfast and 48 units before dinner What changed:  how much to take how to take this when to take this additional instructions   Ozempic (0.25 or 0.5 MG/DOSE) 2 MG/1.5ML Sopn Generic drug: Semaglutide(0.25 or 0.5MG /DOS) Inject 0.5 mg into the skin once a week. What changed:  how much to take when to take this   Pfizer-BioNT COVID-19  Vac-TriS Susp injection Generic drug: COVID-19 mRNA Vac-TriS (Pfizer) Inject into the muscle.   polyethylene glycol 17 g packet Commonly known as: MIRALAX / GLYCOLAX Take 17 g by mouth daily as needed for mild constipation.   ReliOn Pen Needles 31G X 6 MM Misc Generic drug: Insulin Pen Needle USE 1  THREE TIMES DAILY   rosuvastatin 20 MG tablet Commonly known as: CRESTOR Take 1 tablet by mouth once daily What changed: when to take this   traMADol 50 MG tablet Commonly known as: ULTRAM Take 1 tablet (50 mg total) by mouth every 6 (six) hours as needed for moderate pain.   Tyler Aas FlexTouch 100 UNIT/ML FlexTouch Pen Generic drug: insulin degludec INJECT 24 UNITS SUBCUTANEOUSLY ONCE DAILY What changed: See the new instructions.   valsartan 160 MG tablet Commonly known as: DIOVAN Take 1 tablet by mouth once daily          Follow-up Information     [facility] [place]. Go on 11/06/2020.   Specialty: General Surgery Why: 10:00 AM For staple and drain removal. Please arrive 30 min prior to appointment time for check in. Contact information: [name] [place] [address] [phone]         Kinsinger, Arta [name], MD. Go on 11/19/2020.   Specialty: General Surgery Why: 9:30 AM. Please arrive 15-20 min prior to appointment time. Contact information: [place] [address] [phone]                 Signed: [name] , [facility] 11/04/2020, 1:39 PM Please see Amion for pager number during day hours 7:00am-4:30pm

## 2020-11-03 ENCOUNTER — Other Ambulatory Visit: Payer: Self-pay | Admitting: Endocrinology

## 2020-11-06 ENCOUNTER — Ambulatory Visit: Admit: 2020-11-06 | Payer: Medicare PPO | Admitting: Surgery

## 2020-11-06 SURGERY — REPAIR, HERNIA, INCISIONAL
Anesthesia: General

## 2020-11-09 ENCOUNTER — Telehealth: Payer: Self-pay | Admitting: Endocrinology

## 2020-11-09 NOTE — Telephone Encounter (Signed)
Pt calling in to tell us that she had surgery recently in September and she has been having low sugars since Saturday. She feels that her numbers need to be changed. Pt contact [phone].

## 2020-11-09 NOTE — Telephone Encounter (Signed)
Sugar readings  11/09/2020  7:30am- 64  10 units of Novolog   8am- 75   Attempted to get more reading but patient states that she doesn't  know how to read meter for past days   Patient thinks medication needs to be adjusted

## 2020-11-09 NOTE — Telephone Encounter (Signed)
Patient state that the numbers have been about the same as morning readings. Patient agrees with change

## 2020-11-27 ENCOUNTER — Telehealth: Payer: Self-pay | Admitting: Endocrinology

## 2020-11-27 MED ORDER — RELION PEN NEEDLES 32G X 4 MM MISC: 0 refills | Status: DC

## 2020-11-27 NOTE — Telephone Encounter (Signed)
Patient will contact pharmacy and see what pen needles they would recommend and she will give a call back and let us know which ones to send.

## 2020-11-27 NOTE — Telephone Encounter (Signed)
Patient advise that a smaller needle length was sent in to see if that will help.

## 2020-11-27 NOTE — Telephone Encounter (Signed)
MEDICATION: New RX for Delicate Pen Needles (Current needles hurt and cause itching)  PHARMACY:   [facility], [place]. Phone:  [phone]  Fax:  [phone]      HAS THE PATIENT CONTACTED [facility]?  Requires new RX  IS THIS A 90 DAY SUPPLY : Yes  IS PATIENT OUT OF MEDICATION: No  IF NOT; HOW MUCH IS LEFT: ?  LAST APPOINTMENT DATE: @10 /17/2022  NEXT APPOINTMENT DATE:@11 /29/2022  DO WE HAVE YOUR PERMISSION TO LEAVE A DETAILED MESSAGE?: Yes  OTHER COMMENTS:    **Let patient know to contact pharmacy at the end of the day to make sure medication is ready. **  ** Please notify patient to allow 48-72 hours to process**  **Encourage patient to contact the pharmacy for refills or they can request refills through [facility]**

## 2020-12-11 ENCOUNTER — Other Ambulatory Visit: Payer: Self-pay | Admitting: Endocrinology

## 2020-12-22 ENCOUNTER — Other Ambulatory Visit (INDEPENDENT_AMBULATORY_CARE_PROVIDER_SITE_OTHER): Payer: Medicare PPO

## 2020-12-22 ENCOUNTER — Other Ambulatory Visit: Payer: Self-pay

## 2020-12-22 DIAGNOSIS — E1165 Type 2 diabetes mellitus with hyperglycemia: Secondary | ICD-10-CM

## 2020-12-22 DIAGNOSIS — Z794 Long term (current) use of insulin: Secondary | ICD-10-CM | POA: Diagnosis not present

## 2020-12-22 DIAGNOSIS — E782 Mixed hyperlipidemia: Secondary | ICD-10-CM | POA: Diagnosis not present

## 2020-12-22 LAB — MICROALBUMIN / CREATININE URINE RATIO
Creatinine,U: 114.5 mg/dL
Microalb Creat Ratio: 16.9 mg/g (ref 0.0–30.0)
Microalb, Ur: 19.4 mg/dL — ABNORMAL HIGH (ref 0.0–1.9)

## 2020-12-22 LAB — BASIC METABOLIC PANEL
BUN: 58 mg/dL — ABNORMAL HIGH (ref 6–23)
CO2: 26 mEq/L (ref 19–32)
Calcium: 10.1 mg/dL (ref 8.4–10.5)
Chloride: 102 mEq/L (ref 96–112)
Creatinine, Ser: 2.27 mg/dL — ABNORMAL HIGH (ref 0.40–1.20)
GFR: 21.84 mL/min — ABNORMAL LOW (ref >60.00)
Glucose, Bld: 113 mg/dL — ABNORMAL HIGH (ref 70–99)
Potassium: 4.8 mEq/L (ref 3.5–5.1)
Sodium: 138 mEq/L (ref 135–145)

## 2020-12-22 LAB — LIPID PANEL
Cholesterol: 131 mg/dL (ref 0–200)
HDL: 36.1 mg/dL — ABNORMAL LOW (ref >39.00)
LDL Cholesterol: 59 mg/dL (ref 0–99)
NonHDL: 94.55
Total CHOL/HDL Ratio: 4
Triglycerides: 179 mg/dL — ABNORMAL HIGH (ref 0.0–149.0)
VLDL: 35.8 mg/dL (ref 0.0–40.0)

## 2020-12-22 LAB — HEMOGLOBIN A1C: Hgb A1c MFr Bld: 5.3 % (ref 4.6–6.5)

## 2020-12-24 ENCOUNTER — Encounter: Payer: Self-pay | Admitting: Endocrinology

## 2020-12-24 ENCOUNTER — Other Ambulatory Visit: Payer: Self-pay

## 2020-12-24 ENCOUNTER — Ambulatory Visit (INDEPENDENT_AMBULATORY_CARE_PROVIDER_SITE_OTHER): Payer: Medicare PPO | Admitting: Endocrinology

## 2020-12-24 VITALS — BP 142/76 | HR 80 | Ht 63.5 in | Wt 215.4 lb

## 2020-12-24 DIAGNOSIS — N289 Disorder of kidney and ureter, unspecified: Secondary | ICD-10-CM | POA: Diagnosis not present

## 2020-12-24 DIAGNOSIS — Z794 Long term (current) use of insulin: Secondary | ICD-10-CM

## 2020-12-24 DIAGNOSIS — E782 Mixed hyperlipidemia: Secondary | ICD-10-CM | POA: Diagnosis not present

## 2020-12-24 DIAGNOSIS — I10 Essential (primary) hypertension: Secondary | ICD-10-CM

## 2020-12-24 DIAGNOSIS — E1165 Type 2 diabetes mellitus with hyperglycemia: Secondary | ICD-10-CM | POA: Diagnosis not present

## 2020-12-24 NOTE — Patient Instructions (Addendum)
Stop Metformin  Take 1/2 Lasix as needed ONLY  Check blood sugars on waking up 4 days a week  Also check blood sugars about 2 hours after meals and do this after different meals by rotation  Recommended blood sugar levels on waking up are 90-130 and about 2 hours after meal is 130-160  Please bring your blood sugar monitor to each visit, thank you  CALL if sugars get below 90

## 2020-12-24 NOTE — Progress Notes (Addendum)
Patient ID: Stephanie [name], female   DOB: 21-Nov-1955, 65 y.o.   MRN: [id number]           Reason for Appointment: Followup   History of Present Illness:          Diagnosis: Type 2 diabetes mellitus, date of diagnosis: 2003       Past history:  She was initially treated metformin at some point glimepiride also added and not clear what her level of control was in the first few years. Had been only taking 500 mg twice a day of metformin and Amaryl increased to 4 mg twice a day several years ago In the last few years her blood sugar control has been more difficult and she has been managed by an endocrinologist Because of poor control she had been tried on Janumet and [place] but she states that she could not tolerate them because of headaches Had poor control with Amaryl and metformin and also difficulty with weight gain she had started Trulicity in 74/2595 on her initial consultation Her weight on her initial visit was 240 pounds. Her A1c had gone down to 7.1% in 63/87 with using Trulicity which she was tolerating at that time However in 2/16 she stopped taking Trulicity as it was causing nausea , subsequently did not want to go on [place] Because of markedly increased sugars she has been on premixed insulin since 4/16  Recent history:   INSULIN DOSE: NovoLog mix 70/30, 10 before breakfast and 40 units before supper  Tresiba 18 units daily  Oral hypoglycemic drugs the patient is taking are: Metformin 1 g once a day, Ozempic 0.5 mg weekly   A1c is only 5.3 She has had significant anemia recently  Current diabetes history, management, problems and blood sugar patterns:  Her Tyler Aas was reduced on her last visit since her blood sugars were relatively lower and she was told to resume Effort  She appears to have lost weight especially with having hernia surgery in late October  She thinks that she is able to control portions and overall eating better especially with continuing Ozempic   Her blood sugars are being monitored mostly before breakfast and dinner and she forgets to check readings after meals again  Also her monitor is about 2 hours forward on the time  Does not think she is forgetting her insulin doses as prescribed No symptoms of hypoglycemia Recently has not done any regular walking for exercise However her weight has come down significantly   Side effects from medications have been: Januvia, Onglyza apparently caused headaches, Invokana: Candidiasis   Compliance with the medical regimen: Fair    Dinner is usually at 7 pm  Glucose monitoring:  done 1 times a day or more       Glucometer:  Accu-Chek guide .      Blood Glucose readings by download with average readings:  AVERAGE glucose 107 RANGE 71-174 AVERAGE in the mornings about 100 and evening 125  Previously:   PRE-MEAL Mornings Lunch Dinner Bedtime Overall  Glucose range:    148 78-215  Mean/median: 134 138 118  135   POST-MEAL PC Breakfast PC Lunch PC Dinner  Glucose range:     Mean/median:   136      Glycemic control:   Lab Results  Component Value Date   HGBA1C 5.3 12/22/2020   HGBA1C 6.5 09/10/2020   HGBA1C 6.7 (H) 06/02/2020   Lab Results  Component Value Date   MICROALBUR 19.4 (H) 12/22/2020  LDLCALC 59 12/22/2020   CREATININE 2.27 (H) 12/22/2020    Self-care: The diet that the patient has been following is: tries to limit fats usually.      Meals: 2-3 meals per day. Breakfast is cereal or scrambled eggs/sausage.   Lunch is half sandwich, yogurt and fruit; dinner usually baked chicken, bread and vegetables, snacks are fruits or nuts. Not eating out regularly  Dietician visit: Most recent: 06/2015 CDE visit: 12/15              Weight history: Previous range 200-260  Wt Readings from Last 3 Encounters:  12/24/20 215 lb 6.4 oz (97.7 kg)  10/29/20 240 lb 11.9 oz (109.2 kg)  09/28/20 240 lb (108.9 kg)    Other active problems: discussed in review of  systems   LABS:  Lab on 12/22/2020  Component Date Value Ref Range Status   Cholesterol 12/22/2020 131  0 - 200 mg/dL Final   ATP III Classification       Desirable:  < 200 mg/dL               Borderline High:  200 - 239 mg/dL          High:  > = 240 mg/dL   Triglycerides 12/22/2020 179.0 (H)  0.0 - 149.0 mg/dL Final   Normal:  <150 mg/dLBorderline High:  150 - 199 mg/dL   HDL 12/22/2020 36.10 (L)  >39.00 mg/dL Final   VLDL 12/22/2020 35.8  0.0 - 40.0 mg/dL Final   LDL Cholesterol 12/22/2020 59  0 - 99 mg/dL Final   Total CHOL/HDL Ratio 12/22/2020 4   Final                  Men          Women1/2 Average Risk     3.4          3.3Average Risk          5.0          4.42X Average Risk          9.6          7.13X Average Risk          15.0          11.0                       NonHDL 12/22/2020 94.55   Final   NOTE:  Non-HDL goal should be 30 mg/dL higher than patient's LDL goal (i.e. LDL goal of < 70 mg/dL, would have non-HDL goal of < 100 mg/dL)   Sodium 12/22/2020 138  135 - 145 mEq/L Final   Potassium 12/22/2020 4.8  3.5 - 5.1 mEq/L Final   Chloride 12/22/2020 102  96 - 112 mEq/L Final   CO2 12/22/2020 26  19 - 32 mEq/L Final   Glucose, Bld 12/22/2020 113 (H)  70 - 99 mg/dL Final   BUN 12/22/2020 58 (H)  6 - 23 mg/dL Final   Creatinine, Ser 12/22/2020 2.27 (H)  0.40 - 1.20 mg/dL Final   GFR 12/22/2020 21.84 (L)  >60.00 mL/min Final   Calculated using the CKD-EPI Creatinine Equation (2021)   Calcium 12/22/2020 10.1  8.4 - 10.5 mg/dL Final   Microalb, Ur 12/22/2020 19.4 (H)  0.0 - 1.9 mg/dL Final   Creatinine,U 12/22/2020 114.5  mg/dL Final   Microalb Creat Ratio 12/22/2020 16.9  0.0 - 30.0 mg/g Final   Hgb A1c MFr Bld  12/22/2020 5.3  4.6 - 6.5 % Final   Glycemic Control Guidelines for People with Diabetes:Non Diabetic:  <6%Goal of Therapy: <7%Additional Action Suggested:  >8%      Allergies as of 12/24/2020       Reactions   Avapro [irbesartan] Other (See Comments)   headaches    Codeine Nausea And Vomiting   Lisinopril Itching        Medication List        Accurate as of December 24, 2020  1:28 PM. If you have any questions, ask your nurse or doctor.          Accu-Chek FastClix Lancets Misc Use accu chek fastclix lancets to check blood sugar 2-3 times daily. DX:E11.65   Accu-Chek Guide Me w/Device Kit 1 each by Does not apply route 3 (three) times daily. Use accu chek guide me to check blood sugar 2-3 times daily. DX:E11.65   Accu-Chek Guide test strip Generic drug: glucose blood USE 1 STRIP TO CHECK GLUCOSE 2 TO 3 TIMES DAILY   acetaminophen 325 MG tablet Commonly known as: TYLENOL Take 2 tablets (650 mg total) by mouth every 6 (six) hours as needed for mild pain, fever or headache.   allopurinol 100 MG tablet Commonly known as: ZYLOPRIM Take 2 tablets (200 mg total) by mouth daily. For high uric acid What changed: how much to take   ascorbic acid 500 MG tablet Commonly known as: VITAMIN C Take 1 tablet (500 mg total) by mouth 3 (three) times daily.   diclofenac Sodium 1 % Gel Commonly known as: Voltaren Apply 2 g topically 4 (four) times daily.   docusate sodium 100 MG capsule Commonly known as: COLACE Take 1 capsule (100 mg total) by mouth 2 (two) times daily as needed for mild constipation.   ferrous sulfate 325 (65 FE) MG tablet Take 1 tablet (325 mg total) by mouth 3 (three) times daily with meals.   furosemide 40 MG tablet Commonly known as: LASIX Take 1 tablet by mouth once daily   hydrocortisone valerate cream 0.2 % Commonly known as: WESTCORT Apply 1 application topically daily as needed (rash).   labetalol 100 MG tablet Commonly known as: NORMODYNE Take 1 tablet by mouth twice daily   metFORMIN 1000 MG tablet Commonly known as: GLUCOPHAGE Take 1 tablet by mouth once daily with breakfast   methocarbamol 500 MG tablet Commonly known as: ROBAXIN Take 1 tablet (500 mg total) by mouth every 8 (eight) hours as needed  for muscle spasms.   NovoLOG Mix 70/30 FlexPen (70-30) 100 UNIT/ML FlexPen Generic drug: insulin aspart protamine - aspart 10 units before breakfast and 48 units before dinner What changed:  how much to take how to take this when to take this additional instructions   Ozempic (0.25 or 0.5 MG/DOSE) 2 MG/1.5ML Sopn Generic drug: Semaglutide(0.25 or 0.5MG /DOS) Inject 0.5 mg into the skin once a week.   Pfizer-BioNT COVID-19 Vac-TriS Susp injection Generic drug: COVID-19 mRNA Vac-TriS (Pfizer) Inject into the muscle.   polyethylene glycol 17 g packet Commonly known as: MIRALAX / GLYCOLAX Take 17 g by mouth daily as needed for mild constipation.   potassium chloride 10 MEQ tablet Commonly known as: KLOR-CON M Take 1 tablet by mouth once daily   ReliOn Pen Needles 31G X 6 MM Misc Generic drug: Insulin Pen Needle USE 1  THREE TIMES DAILY   ReliOn Pen Needles 32G X 4 MM Misc Generic drug: Insulin Pen Needle Check 3 times daily. E11.65  rosuvastatin 20 MG tablet Commonly known as: CRESTOR Take 1 tablet by mouth once daily What changed: when to take this   traMADol 50 MG tablet Commonly known as: ULTRAM Take 1 tablet (50 mg total) by mouth every 6 (six) hours as needed for moderate pain.   Tyler Aas FlexTouch 100 UNIT/ML FlexTouch Pen Generic drug: insulin degludec INJECT 24 UNITS SUBCUTANEOUSLY ONCE DAILY What changed: See the new instructions.   valsartan 160 MG tablet Commonly known as: DIOVAN Take 1 tablet by mouth once daily        Allergies:  Allergies  Allergen Reactions   Avapro [Irbesartan] Other (See Comments)    headaches   Codeine Nausea And Vomiting   Lisinopril Itching    Past Medical History:  Diagnosis Date   Abnormal Pap smear    Asthma    Diabetes mellitus    Hyperlipidemia    Hypertension    LGSIL (low grade squamous intraepithelial dysplasia)     Past Surgical History:  Procedure Laterality Date   ABDOMINAL HYSTERECTOMY      bladder surgery     BOWEL RESECTION N/A 10/23/2020   Procedure: SMALL BOWEL RESECTION;  Surgeon: Kinsinger, Arta [name], MD;  Location: [facility];  Service: General;  Laterality: N/A;   COLPOSCOPY     INCISIONAL HERNIA REPAIR N/A 10/23/2020   Procedure: OPEN HERNIA REPAIR INCISIONAL WITH MESH;  Surgeon: Kieth Brightly, Arta [name], MD;  Location: [facility];  Service: General;  Laterality: N/A;   INSERTION OF MESH N/A 10/23/2020   Procedure: INSERTION OF MESH;  Surgeon: Kieth Brightly Arta [name], MD;  Location: [facility];  Service: General;  Laterality: N/A;   LAPAROTOMY N/A 10/23/2020   Procedure: EXPLORATORY LAPAROTOMY;  Surgeon: Mickeal Skinner, MD;  Location: [facility];  Service: General;  Laterality: N/A;    Family History  Problem Relation Age of Onset   Diabetes Mother    Diabetes Maternal Grandmother    Heart disease Neg Hx    Hypertension Neg Hx     Social History:  reports that she has never smoked. She has never used smokeless tobacco. She reports that she does not drink alcohol and does not use drugs.    Review of Systems    HYPERURICEMIA:  Uric acid has been significantly high in the past and she was given allopurinol since she was likely having gout previously Prescribed allopurinol 200 mg by the nephrologist  Lab Results  Component Value Date   LABURIC 7.2 (H) 11/26/2018     Hypertension:   She is taking valsartan 160 mg,  and labetalol  Periodically checks at home She is followed by nephrologist also   BP Readings from Last 3 Encounters:  12/24/20 (!) 142/76  10/31/20 (!) 147/64  09/29/20 (!) 152/68   Renal function has been variable as below She has been seen by  nephrologist regularly, however creatinine much higher than before  On Lasix 40 for edema also, currently no edema  Lab Results  Component Value Date   CREATININE 2.27 (H) 12/22/2020   CREATININE 1.21 (H) 10/30/2020   CREATININE 1.40 (H) 10/27/2020   Lab Results  Component Value Date   K 4.8 12/22/2020         Hypercholesterolemia:    LDL has been previously high, around 180 baseline She has been regular with her Crestor and her LDL is improved further  Labs may have been nonfasting       Lab Results  Component Value Date   CHOL 131 12/22/2020  CHOL 163 09/10/2020   CHOL 170 02/05/2020   Lab Results  Component Value Date   HDL 36.10 (L) 12/22/2020   HDL 43.00 09/10/2020   HDL 43.10 02/05/2020   Lab Results  Component Value Date   LDLCALC 59 12/22/2020   LDLCALC 94 09/10/2020   LDLCALC 108 (H) 02/05/2020   Lab Results  Component Value Date   TRIG 179.0 (H) 12/22/2020   TRIG 131.0 09/10/2020   TRIG 95.0 02/05/2020   Lab Results  Component Value Date   CHOLHDL 4 12/22/2020   CHOLHDL 4 09/10/2020   CHOLHDL 4 02/05/2020   Lab Results  Component Value Date   LDLDIRECT 89.0 12/05/2016   LDLDIRECT 183.0 10/01/2015                Diabetic foot exam last done in 02/8411 by PCP   Complications from diabetes: Proliferative retinopathy, microalbuminuria    Physical Examination:  BP (!) 142/76 (BP Location: Left Arm, Patient Position: Sitting, Cuff Size: Small)   Pulse 80   Ht 5' 3.5" (1.613 m)   Wt 215 lb 6.4 oz (97.7 kg)   SpO2 97%   BMI 37.56 kg/m     Diabetic Foot Exam - Simple   Simple Foot Form Diabetic Foot exam was performed with the following findings: Yes   Visual Inspection No deformities, no ulcerations, no other skin breakdown bilaterally: Yes Sensation Testing Intact to touch and monofilament testing bilaterally: Yes Pulse Check Posterior Tibialis and Dorsalis pulse intact bilaterally: Yes Comments    No pedal edema present    ASSESSMENT:  Diabetes type 2, with obesity, insulin requiring  See history of present illness for detailed discussion of his current management, blood sugar patterns and problems identified  Her A1c is 5.3 compared to 6.5  She is on premixed insulin twice a day, Tresiba, Ozempic and low-dose  Metformin  Her A1c is likely falsely low because home blood sugars are averaging just above 100 [place] This may be related to her worsening renal failure and significant anemia lately She has benefited significantly from Ozempic and her blood sugars are mostly close to normal when checked, rarely higher in the evenings Again not checking after meals Recently has not started back on exercise  Plan:  She needs to stop metformin temporarily because of renal insufficiency If she has any tendency to low sugars she will call us otherwise continue same doses for now She does need to understand the need for checking blood sugars after supper to help adjust her insulin Continue Ozempic unchanged and reassured her that this is safe to take as well as will provide long-term benefits and hopefully insulin dose reduction She will try to walk regularly The time on her monitor was set to the right setting  Follow-up in 3 months  Hypertension: Blood pressure is slightly high although better at home   RENAL dysfunction: Significantly higher creatinine may be related to overdiuresis She will stop her Lasix for now and take only half tablet as needed Follow-up with nephrologist later this month  LIPIDS: Excellent control and LDL improved likely from weight loss and better diet also  Follow-up in 3 months   There are no Patient Instructions on file for this visit.     Stephanie [name] 12/24/2020, 1:28 PM   Note: This office note was prepared with Dragon voice recognition system technology. Any transcriptional errors that result from this process are unintentional.

## 2020-12-28 ENCOUNTER — Telehealth: Payer: Self-pay

## 2020-12-28 NOTE — Telephone Encounter (Signed)
Dr. Dwyane Dee PT:   In lobby requesting samples of Rx Ozempic. Per patient Stephanie [name] is out stock of Ozempic. Per Patient she was told by Dr. Dwyane Dee to ask for samples.

## 2020-12-28 NOTE — Telephone Encounter (Signed)
Sample Ozempic 2MG  given to patient, verified with provider.

## 2021-01-04 ENCOUNTER — Other Ambulatory Visit: Payer: Self-pay | Admitting: Endocrinology

## 2021-01-12 DIAGNOSIS — E559 Vitamin D deficiency, unspecified: Secondary | ICD-10-CM | POA: Diagnosis not present

## 2021-01-12 DIAGNOSIS — N1832 Chronic kidney disease, stage 3b: Secondary | ICD-10-CM | POA: Diagnosis not present

## 2021-01-12 DIAGNOSIS — E785 Hyperlipidemia, unspecified: Secondary | ICD-10-CM | POA: Diagnosis not present

## 2021-01-12 DIAGNOSIS — N179 Acute kidney failure, unspecified: Secondary | ICD-10-CM | POA: Diagnosis not present

## 2021-01-12 DIAGNOSIS — I129 Hypertensive chronic kidney disease with stage 1 through stage 4 chronic kidney disease, or unspecified chronic kidney disease: Secondary | ICD-10-CM | POA: Diagnosis not present

## 2021-01-12 DIAGNOSIS — Z794 Long term (current) use of insulin: Secondary | ICD-10-CM | POA: Diagnosis not present

## 2021-01-12 DIAGNOSIS — E1122 Type 2 diabetes mellitus with diabetic chronic kidney disease: Secondary | ICD-10-CM | POA: Diagnosis not present

## 2021-01-12 DIAGNOSIS — D631 Anemia in chronic kidney disease: Secondary | ICD-10-CM | POA: Diagnosis not present

## 2021-01-12 DIAGNOSIS — N189 Chronic kidney disease, unspecified: Secondary | ICD-10-CM | POA: Diagnosis not present

## 2021-01-12 DIAGNOSIS — E669 Obesity, unspecified: Secondary | ICD-10-CM | POA: Diagnosis not present

## 2021-01-28 ENCOUNTER — Other Ambulatory Visit: Payer: Self-pay | Admitting: Endocrinology

## 2021-02-16 DIAGNOSIS — E1121 Type 2 diabetes mellitus with diabetic nephropathy: Secondary | ICD-10-CM | POA: Diagnosis not present

## 2021-02-16 DIAGNOSIS — E1165 Type 2 diabetes mellitus with hyperglycemia: Secondary | ICD-10-CM | POA: Diagnosis not present

## 2021-02-16 DIAGNOSIS — J45909 Unspecified asthma, uncomplicated: Secondary | ICD-10-CM | POA: Diagnosis not present

## 2021-02-16 DIAGNOSIS — N1832 Chronic kidney disease, stage 3b: Secondary | ICD-10-CM | POA: Diagnosis not present

## 2021-02-16 DIAGNOSIS — I10 Essential (primary) hypertension: Secondary | ICD-10-CM | POA: Diagnosis not present

## 2021-02-16 DIAGNOSIS — E11311 Type 2 diabetes mellitus with unspecified diabetic retinopathy with macular edema: Secondary | ICD-10-CM | POA: Diagnosis not present

## 2021-02-16 DIAGNOSIS — E1169 Type 2 diabetes mellitus with other specified complication: Secondary | ICD-10-CM | POA: Diagnosis not present

## 2021-02-16 DIAGNOSIS — I2510 Atherosclerotic heart disease of native coronary artery without angina pectoris: Secondary | ICD-10-CM | POA: Diagnosis not present

## 2021-02-26 ENCOUNTER — Other Ambulatory Visit: Payer: Self-pay | Admitting: Endocrinology

## 2021-03-15 ENCOUNTER — Other Ambulatory Visit: Payer: Self-pay | Admitting: Endocrinology

## 2021-03-17 ENCOUNTER — Other Ambulatory Visit: Payer: Self-pay | Admitting: Endocrinology

## 2021-03-24 ENCOUNTER — Other Ambulatory Visit: Payer: Medicare PPO

## 2021-03-26 ENCOUNTER — Ambulatory Visit: Payer: Medicare PPO | Admitting: Endocrinology

## 2021-04-09 ENCOUNTER — Other Ambulatory Visit (HOSPITAL_COMMUNITY): Payer: Self-pay

## 2021-04-09 ENCOUNTER — Telehealth: Payer: Self-pay

## 2021-04-09 NOTE — Telephone Encounter (Signed)
Can we find out what the insurance will cover. Novolog is not covered.  ?

## 2021-04-13 ENCOUNTER — Other Ambulatory Visit (HOSPITAL_COMMUNITY): Payer: Self-pay

## 2021-04-13 ENCOUNTER — Other Ambulatory Visit: Payer: Self-pay

## 2021-04-13 ENCOUNTER — Other Ambulatory Visit (INDEPENDENT_AMBULATORY_CARE_PROVIDER_SITE_OTHER): Payer: Medicare Other

## 2021-04-13 ENCOUNTER — Telehealth: Payer: Self-pay

## 2021-04-13 DIAGNOSIS — E1165 Type 2 diabetes mellitus with hyperglycemia: Secondary | ICD-10-CM | POA: Diagnosis not present

## 2021-04-13 DIAGNOSIS — Z794 Long term (current) use of insulin: Secondary | ICD-10-CM | POA: Diagnosis not present

## 2021-04-13 LAB — HEMOGLOBIN A1C: Hgb A1c MFr Bld: 6 % (ref 4.6–6.5)

## 2021-04-13 LAB — BASIC METABOLIC PANEL
BUN: 38 mg/dL — ABNORMAL HIGH (ref 6–23)
CO2: 27 mEq/L (ref 19–32)
Calcium: 9.7 mg/dL (ref 8.4–10.5)
Chloride: 106 mEq/L (ref 96–112)
Creatinine, Ser: 2.15 mg/dL — ABNORMAL HIGH (ref 0.40–1.20)
GFR: 23.26 mL/min — ABNORMAL LOW (ref >60.00)
Glucose, Bld: 46 mg/dL — CL (ref 70–99)
Potassium: 4.9 mEq/L (ref 3.5–5.1)
Sodium: 141 mEq/L (ref 135–145)

## 2021-04-13 MED ORDER — INSULIN LISPRO PROT & LISPRO (75-25 MIX) 100 UNIT/ML KWIKPEN: PEN_INJECTOR | SUBCUTANEOUS | 1 refills | Status: DC

## 2021-04-13 NOTE — Telephone Encounter (Signed)
Patient notified

## 2021-04-13 NOTE — Telephone Encounter (Addendum)
Lab contacted on call provider regarding critical lab. ?Pt contacted and confirmed she had not eaten when taking her morning dose of insulin. Pt reminded to eat when taking insulin to ensure blood sugar does not drop so low. Pt verbalized understanding. ?

## 2021-04-16 ENCOUNTER — Ambulatory Visit (INDEPENDENT_AMBULATORY_CARE_PROVIDER_SITE_OTHER): Payer: Medicare Other | Admitting: Endocrinology

## 2021-04-16 ENCOUNTER — Other Ambulatory Visit: Payer: Self-pay

## 2021-04-16 ENCOUNTER — Encounter: Payer: Self-pay | Admitting: Endocrinology

## 2021-04-16 VITALS — BP 142/88 | HR 80 | Ht 63.5 in | Wt 234.0 lb

## 2021-04-16 DIAGNOSIS — E782 Mixed hyperlipidemia: Secondary | ICD-10-CM | POA: Diagnosis not present

## 2021-04-16 DIAGNOSIS — Z794 Long term (current) use of insulin: Secondary | ICD-10-CM | POA: Diagnosis not present

## 2021-04-16 DIAGNOSIS — N289 Disorder of kidney and ureter, unspecified: Secondary | ICD-10-CM

## 2021-04-16 DIAGNOSIS — E1165 Type 2 diabetes mellitus with hyperglycemia: Secondary | ICD-10-CM | POA: Diagnosis not present

## 2021-04-16 NOTE — Progress Notes (Signed)
Patient ID: Stephanie [name], female   DOB: [birth year], 66 y.o.   MRN: [id number] ? ?       ? ? ?Reason for Appointment: Followup  ? ?History of Present Illness:  ?        ?Diagnosis: Type 2 diabetes mellitus, date of diagnosis: 2003      ? ?Past history:  ?She was initially treated metformin at some point glimepiride also added and not clear what her level of control was in the first few years. Had been only taking 500 mg twice a day of metformin and Amaryl increased to 4 mg twice a day several years ago ?In the last few years her blood sugar control has been more difficult and she has been managed by an endocrinologist ?Because of poor control she had been tried on [place] but she states that she could not tolerate them because of headaches ?Had poor control with Amaryl and metformin and also difficulty with weight gain she had started Trulicity in 77/9390 on her initial consultation ?Her weight on her initial visit was 240 pounds. ?Her A1c had gone down to 7.1% in 30/09 with using Trulicity which she was tolerating at that time ?However in 2/16 she stopped taking Trulicity as it was causing nausea , subsequently did not want to go on [place] ?Because of markedly increased sugars she has been on premixed insulin since 4/16 ? ?Recent history:  ? ?INSULIN DOSE: NovoLog mix 70/30, 10 before breakfast and 40 units before supper  ?Tresiba 18 units daily ? ?Oral hypoglycemic drugs the patient is taking are:, Ozempic 0.5 mg weekly ? ? A1c is now 6 ? ?Current diabetes history, management, problems and blood sugar patterns: ? ?Her metformin was stopped on her last visit in December because of renal dysfunction  ?She is still checking blood sugars somewhat sporadically and usually not doing them after meals as directed  ?She has gained a significant amount of weight since the last visit ?This is despite continuing Ozempic 0.5 weekly  ?However she is having sporadic low blood sugars, has had 1 low sugar overnight  and 1 in the morning  ?Also when she came for her lab she had a glucose of 46 likely from taking her morning insulin and not eating but she was completely asymptomatic  ?She will have sporadic high readings during the night since she has checked them between around 2-3 AM ?Only once had a significantly high reading of 236 which she says is from eating sweets  ?Not doing any walking at this time  ?She thinks she is still trying to take her insulin before trying to eat which may be 11 AM in the morning and 7 in the evening when she is eating mostly 2 meals a day ? ? ?Side effects from medications have been: Januvia, Onglyza apparently caused headaches, Invokana: Candidiasis   ?Compliance with the medical regimen: Fair   ? ?Dinner is usually at 7 pm ? ?Glucose monitoring:  done 1 times a day or more       Glucometer:  Accu-Chek guide ?Marland Kitchen      ?Blood Glucose readings by download with average readings: ? ? ?PRE-MEAL Fasting Lunch Dinner Overnight Overall  ?Glucose range: 61-104  71-93 53-236   ?Mean/median:     111  ? ?Previously: ? ?AVERAGE glucose 107 ?RANGE 71-174 ?AVERAGE in the mornings about 100 and evening 125 ? ? ? ? ? ?Glycemic control: ?  ?Lab Results  ?Component Value Date  ?  HGBA1C 6.0 04/13/2021  ? HGBA1C 5.3 12/22/2020  ? HGBA1C 6.5 09/10/2020  ? ?Lab Results  ?Component Value Date  ? MICROALBUR 19.4 (H) 12/22/2020  ? [place] 59 12/22/2020  ? CREATININE 2.15 (H) 04/13/2021  ? ? ?Self-care: The diet that the patient has been following is: tries to limit fats usually.     ? Meals: 2-3 meals per day. Breakfast is cereal or scrambled eggs/sausage. ?  ?Lunch is half sandwich, yogurt and fruit; dinner usually baked chicken, bread and vegetables, snacks are fruits or nuts. Not eating out regularly ? ?Dietician visit: Most recent: 06/2015 ?CDE visit: 12/15             ? ?Weight history: Previous range 200-260 ? ?Wt Readings from Last 3 Encounters:  ?04/16/21 234 lb (106.1 kg)  ?12/24/20 215 lb 6.4 oz (97.7 kg)   ?10/29/20 240 lb 11.9 oz (109.2 kg)  ? ? ?Other active problems: discussed in review of systems ? ? ?LABS: ? ?Lab on 04/13/2021  ?Component Date Value Ref Range Status  ? Sodium 04/13/2021 141  135 - 145 mEq/L Final  ? Potassium 04/13/2021 4.9  3.5 - 5.1 mEq/L Final  ? Chloride 04/13/2021 106  96 - 112 mEq/L Final  ? CO2 04/13/2021 27  19 - 32 mEq/L Final  ? Glucose, Bld 04/13/2021 46 (LL)  70 - 99 mg/dL Final  ? BUN 04/13/2021 38 (H)  6 - 23 mg/dL Final  ? Creatinine, Ser 04/13/2021 2.15 (H)  0.40 - 1.20 mg/dL Final  ? GFR 04/13/2021 23.26 (L)  >60.00 mL/min Final  ? Calculated using the CKD-EPI Creatinine Equation (2021)  ? Calcium 04/13/2021 9.7  8.4 - 10.5 mg/dL Final  ? Hgb A1c MFr Bld 04/13/2021 6.0  4.6 - 6.5 % Final  ? Glycemic Control Guidelines for People with Diabetes:Non Diabetic:  <6%Goal of Therapy: <7%Additional Action Suggested:  >8%   ? ? ? ?Allergies as of 04/16/2021   ? ?   Reactions  ? Avapro [irbesartan] Other (See Comments)  ? headaches  ? Codeine Nausea And Vomiting  ? Lisinopril Itching  ? ?  ? ?  ?Medication List  ?  ? ?  ? Accurate as of April 16, 2021 10:12 AM. If you have any questions, ask your nurse or doctor.  ?  ?  ? ?  ? ?Accu-Chek FastClix Lancets Misc ?Use accu chek fastclix lancets to check blood sugar 2-3 times daily. DX:E11.65 ?  ?Accu-Chek Guide Me w/Device Kit ?1 each by Does not apply route 3 (three) times daily. Use accu chek guide me to check blood sugar 2-3 times daily. DX:E11.65 ?  ?Accu-Chek Guide test strip ?Generic drug: glucose blood ?USE 1 STRIP TO CHECK GLUCOSE 2 TO 3 TIMES DAILY ?  ?acetaminophen 325 MG tablet ?Commonly known as: TYLENOL ?Take 2 tablets (650 mg total) by mouth every 6 (six) hours as needed for mild pain, fever or headache. ?  ?allopurinol 100 MG tablet ?Commonly known as: ZYLOPRIM ?TAKE 2 TABLETS BY MOUTH ONCE DAILY FOR  HIGH  URIC  ACID ?  ?ascorbic acid 500 MG tablet ?Commonly known as: VITAMIN C ?Take 1 tablet (500 mg total) by mouth 3 (three)  times daily. ?  ?diclofenac Sodium 1 % Gel ?Commonly known as: Voltaren ?Apply 2 g topically 4 (four) times daily. ?  ?docusate sodium 100 MG capsule ?Commonly known as: COLACE ?Take 1 capsule (100 mg total) by mouth 2 (two) times daily as needed for mild constipation. ?  ?  ferrous sulfate 325 (65 FE) MG tablet ?Take 1 tablet (325 mg total) by mouth 3 (three) times daily with meals. ?  ?furosemide 40 MG tablet ?Commonly known as: LASIX ?Take 1 tablet by mouth once daily ?  ?hydrocortisone valerate cream 0.2 % ?Commonly known as: WESTCORT ?Apply 1 application topically daily as needed (rash). ?  ?Insulin Lispro Prot & Lispro (75-25) 100 UNIT/ML Kwikpen ?Commonly known as: HumaLOG Mix 75/25 KwikPen ?10 units before breakfast and 48 units before dinner. ?  ?labetalol 100 MG tablet ?Commonly known as: NORMODYNE ?Take 1 tablet by mouth twice daily ?  ?metFORMIN 1000 MG tablet ?Commonly known as: GLUCOPHAGE ?Take 1 tablet by mouth once daily with breakfast ?  ?methocarbamol 500 MG tablet ?Commonly known as: ROBAXIN ?Take 1 tablet (500 mg total) by mouth every 8 (eight) hours as needed for muscle spasms. ?  ?NovoLOG Mix 70/30 FlexPen (70-30) 100 UNIT/ML FlexPen ?Generic drug: insulin aspart protamine - aspart ?10 units before breakfast and 48 units before dinner ?  ?Ozempic (0.25 or 0.5 MG/DOSE) 2 MG/1.5ML Sopn ?Generic drug: Semaglutide(0.25 or 0.5MG /DOS) ?INJECT 0.5MG  SUBCUTANEOUSLY ONCE A WEEK ?  ?Pfizer-BioNT COVID-19 Vac-TriS Susp injection ?Generic drug: COVID-19 mRNA Vac-TriS [employer]) ?Inject into the muscle. ?  ?polyethylene glycol 17 g packet ?Commonly known as: MIRALAX / GLYCOLAX ?Take 17 g by mouth daily as needed for mild constipation. ?  ?potassium chloride 10 MEQ tablet ?Commonly known as: KLOR-CON M ?Take 1 tablet by mouth once daily ?  ?ReliOn Pen Needles 31G X 6 MM Misc ?Generic drug: Insulin Pen Needle ?USE 1  THREE TIMES DAILY ?  ?BD Pen Needle Nano 2nd Gen 32G X 4 MM Misc ?Generic drug: Insulin Pen  Needle ?USE 1 THREE TIMES DAILY ?  ?rosuvastatin 20 MG tablet ?Commonly known as: CRESTOR ?Take 1 tablet by mouth once daily ?  ?traMADol 50 MG tablet ?Commonly known as: ULTRAM ?Take 1 tablet (50 mg total) by mo

## 2021-04-16 NOTE — Patient Instructions (Addendum)
Humalog mix 70/30, 8  before breakfast and 34 units before supper  ? ?Tresiba 14 units daily ? ?Check blood sugars on waking up days 3 a week ? ?Also check blood sugars about 2 hours after meals and do this after different meals by rotation ? ?Recommended blood sugar levels on waking up are 90-130 and about 2 hours after meal is 130-160 ? ?Please bring your blood sugar monitor to each visit, thank you ? ?Take Labetalol 2x daily ?

## 2021-05-05 DIAGNOSIS — E1122 Type 2 diabetes mellitus with diabetic chronic kidney disease: Secondary | ICD-10-CM | POA: Diagnosis not present

## 2021-05-05 DIAGNOSIS — D631 Anemia in chronic kidney disease: Secondary | ICD-10-CM | POA: Diagnosis not present

## 2021-05-05 DIAGNOSIS — K439 Ventral hernia without obstruction or gangrene: Secondary | ICD-10-CM | POA: Diagnosis not present

## 2021-05-05 DIAGNOSIS — I129 Hypertensive chronic kidney disease with stage 1 through stage 4 chronic kidney disease, or unspecified chronic kidney disease: Secondary | ICD-10-CM | POA: Diagnosis not present

## 2021-05-05 DIAGNOSIS — Z794 Long term (current) use of insulin: Secondary | ICD-10-CM | POA: Diagnosis not present

## 2021-05-05 DIAGNOSIS — E785 Hyperlipidemia, unspecified: Secondary | ICD-10-CM | POA: Diagnosis not present

## 2021-05-05 DIAGNOSIS — N1832 Chronic kidney disease, stage 3b: Secondary | ICD-10-CM | POA: Diagnosis not present

## 2021-05-06 ENCOUNTER — Other Ambulatory Visit: Payer: Self-pay | Admitting: Endocrinology

## 2021-05-10 ENCOUNTER — Ambulatory Visit (INDEPENDENT_AMBULATORY_CARE_PROVIDER_SITE_OTHER): Payer: Medicare Other | Admitting: Family Medicine

## 2021-05-10 ENCOUNTER — Encounter: Payer: Self-pay | Admitting: Family Medicine

## 2021-05-10 VITALS — BP 162/74 | HR 84 | Temp 97.6°F | Ht 63.0 in | Wt 234.4 lb

## 2021-05-10 DIAGNOSIS — Z8739 Personal history of other diseases of the musculoskeletal system and connective tissue: Secondary | ICD-10-CM | POA: Diagnosis not present

## 2021-05-10 DIAGNOSIS — I10 Essential (primary) hypertension: Secondary | ICD-10-CM

## 2021-05-10 DIAGNOSIS — N184 Chronic kidney disease, stage 4 (severe): Secondary | ICD-10-CM | POA: Diagnosis not present

## 2021-05-10 DIAGNOSIS — D649 Anemia, unspecified: Secondary | ICD-10-CM | POA: Diagnosis not present

## 2021-05-10 LAB — URIC ACID: Uric Acid, Serum: 6.4 mg/dL (ref 2.4–7.0)

## 2021-05-10 LAB — CBC
HCT: 33.4 % — ABNORMAL LOW (ref 36.0–46.0)
Hemoglobin: 11 g/dL — ABNORMAL LOW (ref 12.0–15.0)
MCHC: 32.9 g/dL (ref 30.0–36.0)
MCV: 90.9 fl (ref 78.0–100.0)
Platelets: 230 10*3/uL (ref 150.0–400.0)
RBC: 3.67 Mil/uL — ABNORMAL LOW (ref 3.87–5.11)
RDW: 14.5 % (ref 11.5–15.5)
WBC: 6.2 10*3/uL (ref 4.0–10.5)

## 2021-05-10 LAB — B12 AND FOLATE PANEL
Folate: 15.1 ng/mL (ref >5.9)
Vitamin B-12: 1478 pg/mL — ABNORMAL HIGH (ref 211–911)

## 2021-05-10 MED ORDER — ALLOPURINOL 100 MG PO TABS: 50.0000 mg | ORAL_TABLET | ORAL | 2 refills | Status: DC

## 2021-05-10 NOTE — Progress Notes (Signed)
? ?Established Patient Office Visit ? ?Subjective:  ?Patient ID: Stephanie [name], female    DOB: [birth year]  Age: 66 y.o. MRN: [id number] ? ?CC:  ?Chief Complaint  ?Patient presents with  ? Establish Care  ?  NP/establish care no concerns. Patient not fasting.   ? ? ?HPI ?Stephanie [name] presents for establishment of care her doctor retired.  She carries diagnose type 2 diabetes, hypertension, lipidemia, CAD, history of gout, diabetic retinopathy and anemia.  Continues to see GYN for her female care.  She has an ophthalmologist and is going to schedule appointment soon herself.  Dr. Dwyane Dee her endocrinologist is managing her hypertension and hyperlipidemia.  Recently saw nephrology for her CKD.  Her last primary care was managing her hypertension.  Currently takes Lasix, labetalol and valsartan for her blood pressure.  She has not taken any of her medicines today.  She tells me that her blood pressure runs in the 130s over 80s range.  She is currently taking 325 mg of iron sulfate daily for her anemia. ?Current ?Past Medical History:  ?Diagnosis Date  ? Abnormal Pap smear   ? Asthma   ? Diabetes mellitus   ? Hyperlipidemia   ? Hypertension   ? LGSIL (low grade squamous intraepithelial dysplasia)   ? ? ?Past Surgical History:  ?Procedure Laterality Date  ? ABDOMINAL HYSTERECTOMY    ? bladder surgery    ? BOWEL RESECTION N/A 10/23/2020  ? Procedure: SMALL BOWEL RESECTION;  Surgeon: Kinsinger, Arta [name], MD;  Location: [facility];  Service: General;  Laterality: N/A;  ? COLPOSCOPY    ? INCISIONAL HERNIA REPAIR N/A 10/23/2020  ? Procedure: OPEN HERNIA REPAIR INCISIONAL WITH MESH;  Surgeon: Kinsinger, Arta [name], MD;  Location: [facility];  Service: General;  Laterality: N/A;  ? INSERTION OF MESH N/A 10/23/2020  ? Procedure: INSERTION OF MESH;  Surgeon: Kinsinger, Arta [name], MD;  Location: [facility];  Service: General;  Laterality: N/A;  ? LAPAROTOMY N/A 10/23/2020  ? Procedure: EXPLORATORY LAPAROTOMY;  Surgeon: Kinsinger, Arta [name], MD;   Location: [facility];  Service: General;  Laterality: N/A;  ? ? ?Family History  ?Problem Relation Age of Onset  ? Diabetes Mother   ? Diabetes Maternal Grandmother   ? Heart disease Neg Hx   ? Hypertension Neg Hx   ? ? ?Social History  ? ?Socioeconomic History  ? Marital status: Single  ?  Spouse name: Not on file  ? Number of children: Not on file  ? Years of education: Not on file  ? Highest education level: Not on file  ?Occupational History  ? Not on file  ?Tobacco Use  ? Smoking status: Never  ? Smokeless tobacco: Never  ?Vaping Use  ? Vaping Use: Never used  ?Substance and Sexual Activity  ? Alcohol use: No  ? Drug use: No  ? Sexual activity: Yes  ?  Birth control/protection: Surgical  ?  Comment: hyst  ?Other Topics Concern  ? Not on file  ?Social History Narrative  ? Not on file  ? ?Social Determinants of Health  ? ?Financial Resource Strain: Not on file  ?Food Insecurity: Not on file  ?Transportation Needs: Not on file  ?Physical Activity: Not on file  ?Stress: Not on file  ?Social Connections: Not on file  ?Intimate Partner Violence: Not on file  ? ? ?Outpatient Medications Prior to Visit  ?Medication Sig Dispense Refill  ? Accu-Chek FastClix Lancets MISC Use accu chek fastclix lancets to check blood sugar  2-3 times daily. DX:E11.65 300 each 1  ? ACCU-CHEK GUIDE test strip USE 1 STRIP TO CHECK GLUCOSE 2 TO 3 TIMES DAILY 300 each 1  ? acetaminophen (TYLENOL) 325 MG tablet Take 2 tablets (650 mg total) by mouth every 6 (six) hours as needed for mild pain, fever or headache.    ? ascorbic acid (VITAMIN C) 500 MG tablet Take 1 tablet (500 mg total) by mouth 3 (three) times daily. 90 tablet 0  ? BD PEN NEEDLE NANO 2ND GEN 32G X 4 MM MISC USE 1 THREE TIMES DAILY 100 each 3  ? Blood Glucose Monitoring Suppl (ACCU-CHEK GUIDE ME) w/Device KIT 1 each by Does not apply route 3 (three) times daily. Use accu chek guide me to check blood sugar 2-3 times daily. DX:E11.65 1 kit 0  ? ferrous sulfate 325 (65 FE) MG tablet Take  1 tablet (325 mg total) by mouth 3 (three) times daily with meals. (Patient taking differently: Take 325 mg by mouth daily.) 90 tablet 0  ? furosemide (LASIX) 40 MG tablet Take 1 tablet by mouth once daily 90 tablet 0  ? hydrocortisone valerate cream (WESTCORT) 0.2 % Apply 1 application topically daily as needed (rash).    ? Insulin Lispro Prot & Lispro (HUMALOG MIX 75/25 KWIKPEN) (75-25) 100 UNIT/ML Kwikpen 10 units before breakfast and 48 units before dinner. 60 mL 1  ? Insulin Pen Needle (RELION PEN NEEDLES) 31G X 6 MM MISC USE 1  THREE TIMES DAILY 200 each 0  ? labetalol (NORMODYNE) 100 MG tablet Take 1 tablet by mouth twice daily 60 tablet 1  ? OZEMPIC, 0.25 OR 0.5 MG/DOSE, 2 MG/1.5ML SOPN INJECT 0.5MG SUBCUTANEOUSLY ONCE A WEEK 2 mL 3  ? potassium chloride (KLOR-CON M) 10 MEQ tablet Take 1 tablet by mouth once daily 90 tablet 0  ? rosuvastatin (CRESTOR) 20 MG tablet Take 1 tablet by mouth once daily 90 tablet 1  ? TRESIBA FLEXTOUCH 100 UNIT/ML FlexTouch Pen INJECT 24 UNITS SUBCUTANEOUSLY ONCE DAILY (Patient taking differently: 18 Units. INJECT 24 UNITS SUBCUTANEOUSLY ONCE DAILY) 15 mL 0  ? valsartan (DIOVAN) 160 MG tablet Take 1 tablet by mouth once daily 90 tablet 1  ? allopurinol (ZYLOPRIM) 100 MG tablet TAKE 2 TABLETS BY MOUTH ONCE DAILY FOR  HIGH  URIC  ACID 60 tablet 1  ? polyethylene glycol (MIRALAX / GLYCOLAX) 17 g packet Take 17 g by mouth daily as needed for mild constipation.    ? traMADol (ULTRAM) 50 MG tablet Take 1 tablet (50 mg total) by mouth every 6 (six) hours as needed for moderate pain. 30 tablet 0  ? COVID-19 mRNA Vac-TriS, Pfizer, SUSP injection Inject into the muscle. 0.3 mL 0  ? diclofenac Sodium (VOLTAREN) 1 % GEL Apply 2 g topically 4 (four) times daily. 100 g 0  ? docusate sodium (COLACE) 100 MG capsule Take 1 capsule (100 mg total) by mouth 2 (two) times daily as needed for mild constipation.    ? metFORMIN (GLUCOPHAGE) 1000 MG tablet Take 1 tablet by mouth once daily with breakfast  (Patient not taking: Reported on 04/16/2021) 90 tablet 0  ? methocarbamol (ROBAXIN) 500 MG tablet Take 1 tablet (500 mg total) by mouth every 8 (eight) hours as needed for muscle spasms. 20 tablet 0  ? NOVOLOG MIX 70/30 FLEXPEN (70-30) 100 UNIT/ML FlexPen 10 units before breakfast and 48 units before dinner (Patient not taking: Reported on 04/16/2021) 60 mL 1  ? ?No facility-administered medications prior to visit.  ? ? ?  Allergies  ?Allergen Reactions  ? Avapro [Irbesartan] Other (See Comments)  ?  headaches  ? Codeine Nausea And Vomiting  ? Lisinopril Itching  ? ? ?ROS ?Review of Systems  ?Constitutional: Negative.   ?HENT: Negative.    ?Eyes:  Negative for photophobia and visual disturbance.  ?Respiratory: Negative.    ?Cardiovascular: Negative.   ?Gastrointestinal: Negative.   ?Genitourinary: Negative.   ?Musculoskeletal:  Negative for gait problem and joint swelling.  ?Neurological:  Negative for speech difficulty, weakness and light-headedness.  ? ?  ?Objective:  ?  ?Physical Exam ?Vitals and nursing note reviewed.  ?Constitutional:   ?   General: She is not in acute distress. ?   Appearance: Normal appearance. She is not ill-appearing, toxic-appearing or diaphoretic.  ?HENT:  ?   Head: Normocephalic and atraumatic.  ?   Right Ear: Tympanic membrane, ear canal and external ear normal.  ?   Left Ear: Tympanic membrane, ear canal and external ear normal.  ?   Mouth/Throat:  ?   Mouth: Mucous membranes are moist.  ?   Pharynx: Oropharynx is clear. No oropharyngeal exudate or posterior oropharyngeal erythema.  ?Eyes:  ?   Extraocular Movements: Extraocular movements intact.  ?   Conjunctiva/sclera: Conjunctivae normal.  ?   Pupils: Pupils are equal, round, and reactive to light.  ?Cardiovascular:  ?   Rate and Rhythm: Normal rate and regular rhythm.  ?Pulmonary:  ?   Effort: Pulmonary effort is normal.  ?   Breath sounds: Normal breath sounds.  ?Abdominal:  ?   General: Bowel sounds are normal.  ?Musculoskeletal:  ?    Cervical back: No rigidity or tenderness.  ?   Right lower leg: No edema.  ?   Left lower leg: No edema.  ?Lymphadenopathy:  ?   Cervical: No cervical adenopathy.  ?Skin: ?   General: Skin is warm a

## 2021-05-11 LAB — IRON,TIBC AND FERRITIN PANEL
%SAT: 19 % (calc) (ref 16–45)
Ferritin: 39 ng/mL (ref 16–288)
Iron: 77 ug/dL (ref 45–160)
TIBC: 405 mcg/dL (calc) (ref 250–450)

## 2021-05-23 ENCOUNTER — Other Ambulatory Visit: Payer: Self-pay | Admitting: Endocrinology

## 2021-05-28 ENCOUNTER — Other Ambulatory Visit: Payer: Self-pay | Admitting: Endocrinology

## 2021-06-01 ENCOUNTER — Other Ambulatory Visit: Payer: Self-pay | Admitting: Endocrinology

## 2021-06-09 ENCOUNTER — Ambulatory Visit: Payer: Medicare Other | Admitting: Family Medicine

## 2021-06-09 ENCOUNTER — Telehealth: Payer: Self-pay | Admitting: Family Medicine

## 2021-06-09 NOTE — Telephone Encounter (Signed)
Patient/Caregiver was notified of No Show/Late Cancellation Policy & possible $82 charge. ?Visit was cancelled with reason "No Show/Cancel within 24 hours" for tracking & charging. ? ?Caller Name: Anamika Kueker ?Caller Ph #: [phone] ?Date of APPT: 06/09/21 ?Reason given for no show/late cancellation: none ?No Show Letter printed & put in outgoing mail (Yes/No): yes ? ?~~~Route message to admin supervisor and clinical team/CMA~~~ ? ?  ?

## 2021-06-14 ENCOUNTER — Telehealth: Payer: Self-pay | Admitting: Family Medicine

## 2021-06-14 NOTE — Telephone Encounter (Signed)
Left message for patient to call back and schedule Medicare Annual Wellness Visit (AWV) .   Please offer to do virtually or by telephone.  Left office number and my jabber [phone].  Last AWV:11/24/2013  Please schedule at anytime with Nurse Health Advisor.

## 2021-06-15 ENCOUNTER — Telehealth: Payer: Self-pay | Admitting: Family Medicine

## 2021-06-15 ENCOUNTER — Other Ambulatory Visit: Payer: Self-pay | Admitting: Obstetrics and Gynecology

## 2021-06-15 DIAGNOSIS — Z1231 Encounter for screening mammogram for malignant neoplasm of breast: Secondary | ICD-10-CM

## 2021-06-15 NOTE — Telephone Encounter (Signed)
Pt missed her 06/09/21 app for Return in about 4 weeks (around 06/07/2021). She says she has been taking her medication and feels fine. She is wanting to know if she should reschedule this appointment. I let her know I thought she should, she wants your opinion. [phone]

## 2021-06-15 NOTE — Telephone Encounter (Signed)
Appointment scheduled for follow up on BP and labs

## 2021-06-29 ENCOUNTER — Ambulatory Visit (INDEPENDENT_AMBULATORY_CARE_PROVIDER_SITE_OTHER): Payer: Medicare Other

## 2021-06-29 ENCOUNTER — Ambulatory Visit: Payer: Medicare Other

## 2021-06-29 DIAGNOSIS — Z780 Asymptomatic menopausal state: Secondary | ICD-10-CM | POA: Diagnosis not present

## 2021-06-29 DIAGNOSIS — Z0000 Encounter for general adult medical examination without abnormal findings: Secondary | ICD-10-CM

## 2021-06-29 DIAGNOSIS — Z1211 Encounter for screening for malignant neoplasm of colon: Secondary | ICD-10-CM

## 2021-06-29 NOTE — Progress Notes (Addendum)
Subjective:   Stephanie [name] is a 66 y.o. female who presents for Medicare Annual (Subsequent) preventive examination.   I connected with Mallory Shirk today by telephone and verified that I am speaking with the correct person using two identifiers. Location patient: home Location provider: work Persons participating in the virtual visit: patient, provider.   I discussed the limitations, risks, security and privacy concerns of performing an evaluation and management service by telephone and the availability of in person appointments. I also discussed with the patient that there may be a patient responsible charge related to this service. The patient expressed understanding and verbally consented to this telephonic visit.    Interactive audio and video telecommunications were attempted between this provider and patient, however failed, due to patient having technical difficulties OR patient did not have access to video capability.  We continued and completed visit with audio only.    Review of Systems     Cardiac Risk Factors include: advanced age (>32men, >85 women)     Objective:    Today's Vitals   There is no height or weight on file to calculate BMI.     06/29/2021    1:44 PM 09/28/2020   11:04 PM 05/30/2018    4:12 AM 06/26/2015    1:38 PM 05/06/2014    6:31 PM 01/02/2014    5:29 AM 12/25/2013    2:16 AM  Advanced Directives  Does Patient Have a Medical Advance Directive? No No No No No No No  Would patient like information on creating a medical advance directive? No - Patient declined   Yes - Educational materials given  No - patient declined information     Current Medications (verified) Outpatient Encounter Medications as of 06/29/2021  Medication Sig   Accu-Chek FastClix Lancets MISC Use accu chek fastclix lancets to check blood sugar 2-3 times daily. DX:E11.65   ACCU-CHEK GUIDE test strip USE 1  TO CHECK GLUCOSE 2 TO 3 TIMES DAILY   acetaminophen (TYLENOL) 325 MG tablet  Take 2 tablets (650 mg total) by mouth every 6 (six) hours as needed for mild pain, fever or headache.   allopurinol (ZYLOPRIM) 100 MG tablet Take 0.5 tablets (50 mg total) by mouth every other day.   BD PEN NEEDLE NANO 2ND GEN 32G X 4 MM MISC USE 1 THREE TIMES DAILY   Blood Glucose Monitoring Suppl (ACCU-CHEK GUIDE ME) w/Device KIT 1 each by Does not apply route 3 (three) times daily. Use accu chek guide me to check blood sugar 2-3 times daily. DX:E11.65   ferrous sulfate 325 (65 FE) MG tablet Take 1 tablet (325 mg total) by mouth 3 (three) times daily with meals. (Patient taking differently: Take 325 mg by mouth daily.)   furosemide (LASIX) 40 MG tablet Take 1 tablet by mouth once daily   hydrocortisone valerate cream (WESTCORT) 0.2 % Apply 1 application topically daily as needed (rash).   insulin degludec (TRESIBA FLEXTOUCH) 100 UNIT/ML FlexTouch Pen INJECT 14 UNITS SUBCUTANEOUSLY ONCE DAILY   Insulin Lispro Prot & Lispro (HUMALOG MIX 75/25 KWIKPEN) (75-25) 100 UNIT/ML Kwikpen 10 units before breakfast and 48 units before dinner.   Insulin Pen Needle (RELION PEN NEEDLES) 31G X 6 MM MISC USE 1  THREE TIMES DAILY   labetalol (NORMODYNE) 100 MG tablet Take 1 tablet by mouth twice daily   OZEMPIC, 0.25 OR 0.5 MG/DOSE, 2 MG/1.5ML SOPN INJECT 0.$RemoveBefor'5MG'VoldwyWudgqD$  SUBCUTANEOUSLY ONCE A WEEK   potassium chloride (KLOR-CON M) 10 MEQ tablet Take 1  tablet by mouth once daily   rosuvastatin (CRESTOR) 20 MG tablet Take 1 tablet by mouth once daily   valsartan (DIOVAN) 160 MG tablet Take 1 tablet by mouth once daily   ascorbic acid (VITAMIN C) 500 MG tablet Take 1 tablet (500 mg total) by mouth 3 (three) times daily.   No facility-administered encounter medications on file as of 06/29/2021.    Allergies (verified) Avapro [irbesartan], Codeine, and Lisinopril   History: Past Medical History:  Diagnosis Date   Abnormal Pap smear    Asthma    Diabetes mellitus    Hyperlipidemia    Hypertension    LGSIL (low grade  squamous intraepithelial dysplasia)    Past Surgical History:  Procedure Laterality Date   ABDOMINAL HYSTERECTOMY     bladder surgery     BOWEL RESECTION N/A 10/23/2020   Procedure: SMALL BOWEL RESECTION;  Surgeon: Kinsinger, De Blanch, MD;  Location: [facility] OR;  Service: General;  Laterality: N/A;   COLPOSCOPY     INCISIONAL HERNIA REPAIR N/A 10/23/2020   Procedure: OPEN HERNIA REPAIR INCISIONAL WITH MESH;  Surgeon: Sheliah Hatch, De Blanch, MD;  Location: [facility] OR;  Service: General;  Laterality: N/A;   INSERTION OF MESH N/A 10/23/2020   Procedure: INSERTION OF MESH;  Surgeon: Kinsinger, De Blanch, MD;  Location: [facility] OR;  Service: General;  Laterality: N/A;   LAPAROTOMY N/A 10/23/2020   Procedure: EXPLORATORY LAPAROTOMY;  Surgeon: Rodman Pickle, MD;  Location: [facility] OR;  Service: General;  Laterality: N/A;   Family History  Problem Relation Age of Onset   Diabetes Mother    Diabetes Maternal Grandmother    Heart disease Neg Hx    Hypertension Neg Hx    Social History   Socioeconomic History   Marital status: Single    Spouse name: Not on file   Number of children: Not on file   Years of education: Not on file   Highest education level: Not on file  Occupational History   Not on file  Tobacco Use   Smoking status: Never   Smokeless tobacco: Never  Vaping Use   Vaping Use: Never used  Substance and Sexual Activity   Alcohol use: No   Drug use: No   Sexual activity: Yes    Birth control/protection: Surgical    Comment: hyst  Other Topics Concern   Not on file  Social History Narrative   Not on file   Social Determinants of Health   Financial Resource Strain: Low Risk    Difficulty of Paying Living Expenses: Not hard at all  Food Insecurity: No Food Insecurity   Worried About Programme researcher, broadcasting/film/video in the Last Year: Never true   Barista in the Last Year: Never true  Transportation Needs: No Transportation Needs   Lack of Transportation (Medical): No   Lack of  Transportation (Non-Medical): No  Physical Activity: Insufficiently Active   Days of Exercise per Week: 3 days   Minutes of Exercise per Session: 30 min  Stress: No Stress Concern Present   Feeling of Stress : Not at all  Social Connections: Moderately Isolated   Frequency of Communication with Friends and Family: Three times a week   Frequency of Social Gatherings with Friends and Family: Three times a week   Attends Religious Services: More than 4 times per year   Active Member of Clubs or Organizations: No   Attends Banker Meetings: Never   Marital Status: Never married  Tobacco Counseling Counseling given: Not Answered   Clinical Intake:  Pre-visit preparation completed: Yes  Pain : No/denies pain     Nutritional Risks: None Diabetes: Yes CBG done?: No Did pt. bring in CBG monitor from home?: No  How often do you need to have someone help you when you read instructions, pamphlets, or other written materials from your doctor or pharmacy?: 1 - Never What is the last grade level you completed in school?: High School  Diabetic?yes Nutrition Risk Assessment:  Has the patient had any N/V/D within the last 2 months?  No  Does the patient have any non-healing wounds?  No  Has the patient had any unintentional weight loss or weight gain?  No   Diabetes:  Is the patient diabetic?  Yes  If diabetic, was a CBG obtained today?  No  Did the patient bring in their glucometer from home?  No  How often do you monitor your CBG's? Daily .   Financial Strains and Diabetes Management:  Are you having any financial strains with the device, your supplies or your medication? No .  Does the patient want to be seen by Chronic Care Management for management of their diabetes?  No  Would the patient like to be referred to a Nutritionist or for Diabetic Management?  No   Diabetic Exams:  Diabetic Eye Exam: Completed 05/2020 Diabetic Foot Exam: Overdue, Pt has been  advised about the importance in completing this exam. Pt is scheduled for diabetic foot exam on next office visit .   Interpreter Needed?: No  Information entered by :: [email or web address]   Activities of Daily Living    06/29/2021    1:50 PM  In your present state of health, do you have any difficulty performing the following activities:  Hearing? 0  Vision? 0  Difficulty concentrating or making decisions? 0  Walking or climbing stairs? 0  Dressing or bathing? 0  Doing errands, shopping? 0  Preparing Food and eating ? N  Using the Toilet? N  In the past six months, have you accidently leaked urine? N  Do you have problems with loss of bowel control? N  Managing your Medications? N  Managing your Finances? N  Housekeeping or managing your Housekeeping? N    Patient Care Team: Libby Maw, MD as PCP - General (Family Medicine)  Indicate any recent Medical Services you may have received from other than [facility] providers in the past year (date may be approximate).     Assessment:   This is a routine wellness examination for [place].  Hearing/Vision screen Vision Screening - Comments:: Annual eye exams   Dietary issues and exercise activities discussed: Current Exercise Habits: Home exercise routine, Type of exercise: walking, Time (Minutes): 30, Frequency (Times/Week): 3, Weekly Exercise (Minutes/Week): 90, Intensity: Mild, Exercise limited by: None identified   Goals Addressed   None    Depression Screen    06/29/2021    1:46 PM 06/29/2021    1:43 PM 05/10/2021    1:57 PM 05/10/2021    1:15 PM 06/26/2015    1:39 PM  PHQ 2/9 Scores  PHQ - 2 Score 0 0 0 0 0  PHQ- 9 Score   0      Fall Risk    06/29/2021    1:45 PM 05/10/2021    1:15 PM 06/26/2015    1:39 PM  [place] in the past year? 0 0 No  Number falls in past yr: 0  0   Injury with Fall? 0    Follow up Falls evaluation completed      FALL RISK PREVENTION PERTAINING TO THE HOME:  Any stairs in or  around the home? Yes  If so, are there any without handrails? No  Home free of loose throw rugs in walkways, pet beds, electrical cords, etc? Yes  Adequate lighting in your home to reduce risk of falls? Yes   ASSISTIVE DEVICES UTILIZED TO PREVENT FALLS:  Life alert? No  Use of a cane, walker or w/c? No  Grab bars in the bathroom? Yes  Shower chair or bench in shower? Yes  Elevated toilet seat or a handicapped toilet? No    Cognitive Function:  Normal cognitive status assessed by telephone conversation  by this Nurse Health Advisor. No abnormalities found.        Immunizations Immunization History  Administered Date(s) Administered   Fluad Quad(high Dose 65+) 10/27/2020   Influenza Split 10/10/2016   Influenza,inj,Quad PF,6+ Mos 11/24/2010, 10/28/2013, 12/03/2014, 12/11/2015   Influenza-Unspecified 11/20/2012   PFIZER Comirnaty(Gray Top)Covid-19 Tri-Sucrose Vaccine 09/16/2020   PFIZER(Purple Top)SARS-COV-2 Vaccination 03/24/2019, 04/23/2019   PNEUMOCOCCAL CONJUGATE-20 08/30/2020   Pneumococcal Polysaccharide-23 05/11/2000, 12/27/2010, 10/10/2016   Td 05/17/2002   Tdap 02/08/2012, 10/10/2016   Unspecified SARS-COV-2 Vaccination 03/22/2019, 04/19/2019   Zoster, Live 02/07/2014, 10/10/2016, 05/19/2020    TDAP status: Up to date  Flu Vaccine status: Up to date  Pneumococcal vaccine status: Up to date  Covid-19 vaccine status: Completed vaccines  Qualifies for Shingles Vaccine? Yes   Zostavax completed No   Shingrix Completed?: No.    Education has been provided regarding the importance of this vaccine. Patient has been advised to call insurance company to determine out of pocket expense if they have not yet received this vaccine. Advised may also receive vaccine at local pharmacy or Health Dept. Verbalized acceptance and understanding.  Screening Tests Health Maintenance  Topic Date Due   Hepatitis C Screening  Never done   Zoster Vaccines- Shingrix (1 of 2) Never done    DEXA SCAN  Never done   COLONOSCOPY (Pts 45-37yrs Insurance coverage will need to be confirmed)  06/21/2021   OPHTHALMOLOGY EXAM  05/11/2022 (Originally 10/06/1965)   INFLUENZA VACCINE  08/24/2021   HEMOGLOBIN A1C  10/14/2021   FOOT EXAM  12/24/2021   MAMMOGRAM  06/19/2022   TETANUS/TDAP  10/11/2026   Pneumonia Vaccine 67+ Years old  Completed   COVID-19 Vaccine  Completed   HPV VACCINES  Aged Out    Health Maintenance  Health Maintenance Due  Topic Date Due   Hepatitis C Screening  Never done   Zoster Vaccines- Shingrix (1 of 2) Never done   DEXA SCAN  Never done   COLONOSCOPY (Pts 45-41yrs Insurance coverage will need to be confirmed)  06/21/2021    Colorectal cancer screening: Referral to GI placed 06/29/2021. Pt aware the office will call re: appt.  Mammogram status: Ordered scheduled 07/15/2021. Pt provided with contact info and advised to call to schedule appt.   Bone Density status: Ordered 06/29/2021. Pt provided with contact info and advised to call to schedule appt.  Lung Cancer Screening: (Low Dose CT Chest recommended if Age 77-80 years, 30 pack-year currently smoking OR have quit w/in 15years.) does not qualify.   Lung Cancer Screening Referral: n/a  Additional Screening:  Hepatitis C Screening: does not qualify;   Vision Screening: Recommended annual ophthalmology exams for early detection of glaucoma and other disorders of the eye. Is  the patient up to date with their annual eye exam?  Yes  Who is the provider or what is the name of the office in which the patient attends annual eye exams? Dr.Hayes  If pt is not established with a provider, would they like to be referred to a provider to establish care? No .   Dental Screening: Recommended annual dental exams for proper oral hygiene  Community Resource Referral / Chronic Care Management: CRR required this visit?  No   CCM required this visit?  No      Plan:     I have personally reviewed and  noted the following in the patient's chart:   Medical and social history Use of alcohol, tobacco or illicit drugs  Current medications and supplements including opioid prescriptions.  Functional ability and status Nutritional status Physical activity Advanced directives List of other physicians Hospitalizations, surgeries, and ER visits in previous 12 months Vitals Screenings to include cognitive, depression, and falls Referrals and appointments  In addition, I have reviewed and discussed with patient certain preventive protocols, quality metrics, and best practice recommendations. A written personalized care plan for preventive services as well as general preventive health recommendations were provided to patient.     Randel Pigg, LPN   05/27/2990   Nurse Notes: none    I have personally reviewed this encounter including the documentation in this note and have collaborated with the care management provider regarding care management and care coordination activities to include development and update of the comprehensive care plan. I am certifying that I agree with the content of this note and encounter as supervising physician.

## 2021-06-30 NOTE — Telephone Encounter (Signed)
1st no show, fee waived ?

## 2021-07-01 ENCOUNTER — Encounter: Payer: Self-pay | Admitting: Family Medicine

## 2021-07-01 ENCOUNTER — Ambulatory Visit (INDEPENDENT_AMBULATORY_CARE_PROVIDER_SITE_OTHER): Payer: Medicare Other | Admitting: Family Medicine

## 2021-07-01 VITALS — BP 158/78 | HR 74 | Temp 97.6°F | Ht 63.0 in | Wt 235.0 lb

## 2021-07-01 DIAGNOSIS — Z8739 Personal history of other diseases of the musculoskeletal system and connective tissue: Secondary | ICD-10-CM | POA: Insufficient documentation

## 2021-07-01 DIAGNOSIS — I10 Essential (primary) hypertension: Secondary | ICD-10-CM | POA: Diagnosis not present

## 2021-07-01 NOTE — Progress Notes (Signed)
Established Patient Office Visit  Subjective   Patient ID: Stephanie [name], female    DOB: 09-16-1955  Age: 66 y.o. MRN: [id number]  Chief Complaint  Patient presents with   Follow-up    Routine follow up, no concerns. Patient fasting.     HPI follow-up of hypertension and gout.  Patient has had no gouty flares after decreasing her allopurinol to 50 mg every other day.  She is now taking her blood pressure medicines as directed which includes Lasix 40 mg on Tuesday, Thursday and Saturday, and valsartan 160 mg daily.  She has made lifestyle changes that include making a concerted effort to decrease the sodium in her diet.  Brings in a list of blood pressures that show vast improvement especially over the last week where her pressures have been running in the 130s over 70s.  Follow-up with Dr. Dwyane Dee for diabetes in a few weeks.  Follow-up with nephrology is pending.    Review of Systems  Constitutional: Negative.   HENT: Negative.    Eyes:  Negative for blurred vision, discharge and redness.  Respiratory: Negative.    Cardiovascular: Negative.   Gastrointestinal:  Negative for abdominal pain.  Genitourinary: Negative.   Musculoskeletal: Negative.  Negative for myalgias.  Skin:  Negative for rash.  Neurological:  Negative for tingling, loss of consciousness and weakness.  Endo/Heme/Allergies:  Negative for polydipsia.      Objective:     BP (!) 158/78 (BP Location: Left Arm, Patient Position: Sitting, Cuff Size: Large)   Pulse 74   Temp 97.6 F (36.4 C) (Temporal)   Ht 5\' 3"  (1.6 m)   Wt 235 lb (106.6 kg)   SpO2 97%   BMI 41.63 kg/m  Wt Readings from Last 3 Encounters:  07/01/21 235 lb (106.6 kg)  05/10/21 234 lb 6.4 oz (106.3 kg)  04/16/21 234 lb (106.1 kg)      Physical Exam Constitutional:      General: She is not in acute distress.    Appearance: Normal appearance. She is not ill-appearing, toxic-appearing or diaphoretic.  HENT:     Head: Normocephalic and  atraumatic.     Right Ear: External ear normal.     Left Ear: External ear normal.  Eyes:     General: No scleral icterus.       Right eye: No discharge.        Left eye: No discharge.     Extraocular Movements: Extraocular movements intact.     Conjunctiva/sclera: Conjunctivae normal.  Cardiovascular:     Rate and Rhythm: Normal rate and regular rhythm.  Pulmonary:     Effort: Pulmonary effort is normal. No respiratory distress.     Breath sounds: Normal breath sounds.  Musculoskeletal:     Cervical back: No rigidity or tenderness.     Right lower leg: Edema (trace) present.     Left lower leg: Edema (trace) present.  Skin:    General: Skin is warm and dry.  Neurological:     Mental Status: She is alert and oriented to person, place, and time.  Psychiatric:        Mood and Affect: Mood normal.        Behavior: Behavior normal.      No results found for any visits on 07/01/21.    The 10-year ASCVD risk score (Arnett DK, et al., 2019) is: 44%    Assessment & Plan:   Problem List Items Addressed This Visit  Cardiovascular and Mediastinum   Essential hypertension - Primary     Other   History of gout    Return in about 8 weeks (around 08/26/2021), or Continue blood pressure checks and please note day of week..  Lifestyle lab continue lifestyle efforts.  Could consider daily Lasix versus increasing valsartan if needed.  Libby Maw, MD

## 2021-07-05 ENCOUNTER — Other Ambulatory Visit (HOSPITAL_COMMUNITY): Payer: Self-pay

## 2021-07-06 ENCOUNTER — Telehealth: Payer: Self-pay

## 2021-07-06 ENCOUNTER — Other Ambulatory Visit (INDEPENDENT_AMBULATORY_CARE_PROVIDER_SITE_OTHER): Payer: Medicare Other

## 2021-07-06 ENCOUNTER — Ambulatory Visit: Payer: Medicare Other

## 2021-07-06 DIAGNOSIS — Z794 Long term (current) use of insulin: Secondary | ICD-10-CM

## 2021-07-06 DIAGNOSIS — E782 Mixed hyperlipidemia: Secondary | ICD-10-CM

## 2021-07-06 DIAGNOSIS — E1165 Type 2 diabetes mellitus with hyperglycemia: Secondary | ICD-10-CM

## 2021-07-06 LAB — COMPREHENSIVE METABOLIC PANEL
ALT: 17 U/L (ref 0–35)
AST: 17 U/L (ref 0–37)
Albumin: 4.3 g/dL (ref 3.5–5.2)
Alkaline Phosphatase: 52 U/L (ref 39–117)
BUN: 41 mg/dL — ABNORMAL HIGH (ref 6–23)
CO2: 29 mEq/L (ref 19–32)
Calcium: 9.9 mg/dL (ref 8.4–10.5)
Chloride: 104 mEq/L (ref 96–112)
Creatinine, Ser: 2.2 mg/dL — ABNORMAL HIGH (ref 0.40–1.20)
GFR: 22.59 mL/min — ABNORMAL LOW (ref >60.00)
Glucose, Bld: 46 mg/dL — CL (ref 70–99)
Potassium: 4.4 mEq/L (ref 3.5–5.1)
Sodium: 141 mEq/L (ref 135–145)
Total Bilirubin: 0.7 mg/dL (ref 0.2–1.2)
Total Protein: 7.9 g/dL (ref 6.0–8.3)

## 2021-07-06 LAB — LIPID PANEL
Cholesterol: 135 mg/dL (ref 0–200)
HDL: 41.2 mg/dL (ref >39.00)
LDL Cholesterol: 78 mg/dL (ref 0–99)
NonHDL: 93.78
Total CHOL/HDL Ratio: 3
Triglycerides: 79 mg/dL (ref 0.0–149.0)
VLDL: 15.8 mg/dL (ref 0.0–40.0)

## 2021-07-06 LAB — HEMOGLOBIN A1C: Hgb A1c MFr Bld: 5.8 % (ref 4.6–6.5)

## 2021-07-06 NOTE — Telephone Encounter (Signed)
Main lab called with a critical glucose of 46.

## 2021-07-06 NOTE — Telephone Encounter (Signed)
Patient states that she is having a little blurred vision just today but she hasn't eaten anything. She is about to have a big salad.

## 2021-07-08 ENCOUNTER — Encounter: Payer: Self-pay | Admitting: Endocrinology

## 2021-07-08 ENCOUNTER — Ambulatory Visit (INDEPENDENT_AMBULATORY_CARE_PROVIDER_SITE_OTHER): Payer: Medicare Other | Admitting: Endocrinology

## 2021-07-08 ENCOUNTER — Ambulatory Visit: Payer: Medicare Other | Admitting: Family Medicine

## 2021-07-08 ENCOUNTER — Other Ambulatory Visit: Payer: Self-pay | Admitting: Endocrinology

## 2021-07-08 VITALS — BP 142/80 | HR 90 | Ht 63.0 in | Wt 231.0 lb

## 2021-07-08 DIAGNOSIS — E782 Mixed hyperlipidemia: Secondary | ICD-10-CM

## 2021-07-08 DIAGNOSIS — E1165 Type 2 diabetes mellitus with hyperglycemia: Secondary | ICD-10-CM

## 2021-07-08 DIAGNOSIS — I10 Essential (primary) hypertension: Secondary | ICD-10-CM

## 2021-07-08 DIAGNOSIS — Z794 Long term (current) use of insulin: Secondary | ICD-10-CM

## 2021-07-08 DIAGNOSIS — E1121 Type 2 diabetes mellitus with diabetic nephropathy: Secondary | ICD-10-CM | POA: Diagnosis not present

## 2021-07-08 NOTE — Patient Instructions (Signed)
Check blood sugars on waking up days a week  Also check blood sugars about 2 hours after meals and do this after different meals by rotation  Recommended blood sugar levels on waking up are 80-130 and about 2 hours after meal is 130-160  Please bring your blood sugar monitor to each visit, thank you

## 2021-07-08 NOTE — Progress Notes (Signed)
Patient ID: Stephanie [name], female   DOB: 02/10/1955, 66 y.o.   MRN: [id number]           Reason for Appointment: Followup   History of Present Illness:          Diagnosis: Type 2 diabetes mellitus, date of diagnosis: 2003       Past history:  She was initially treated metformin at some point glimepiride also added and not clear what her level of control was in the first few years. Had been only taking 500 mg twice a day of metformin and Amaryl increased to 4 mg twice a day several years ago In the last few years her blood sugar control has been more difficult and she has been managed by an endocrinologist Because of poor control she had been tried on Janumet and Dike but she states that she could not tolerate them because of headaches Had poor control with Amaryl and metformin and also difficulty with weight gain she had started Trulicity in 24/2353 on her initial consultation Her weight on her initial visit was 240 pounds. Her A1c had gone down to 7.1% in 61/44 with using Trulicity which she was tolerating at that time However in 2/16 she stopped taking Trulicity as it was causing nausea , subsequently did not want to go on Tanzeum Because of markedly increased sugars she has been on premixed insulin since 4/16  Recent history:   INSULIN DOSE: NovoLog mix 70/30, 8 before breakfast and 34 units before supper  Tresiba 14 units daily  Oral hypoglycemic drugs the patient is taking are:, Ozempic 0.5 mg weekly   A1c is now 5.8  Current diabetes history, management, problems and blood sugar patterns:   Her insulin doses were reduced on the last visit because of low normal sugars  She is checking her blood sugars mostly once a day and only occasionally more than once a day but not clear if she is checking it consistently after meals again  She is very apprehensive about trying a continuous glucose sensor as she does not think she can handle this  She thinks she is doing better with  her diet overall and getting more vegetables in her diet Her weight is down slightly although previously had some edema However she is having sporadic low blood sugars, has had 1 low sugar overnight and 1 in the morning  Again when she came for her lab she had a glucose of 46 likely from taking her morning insulin and not eating; not clear if she had any significant symptoms again Only once has had a high reading of 204 but this was after eating some cake No walking program as yet   Side effects from medications have been: Januvia, Onglyza apparently caused headaches, Invokana: Candidiasis   Compliance with the medical regimen: Fair    Dinner is usually at 7 pm  Glucose monitoring:  done 1 times a day or more       Glucometer:  Accu-Chek guide .      Blood Glucose readings by download with average readings for 30 days:   PRE-MEAL Fasting Lunch Dinner Bedtime Overall  Glucose range: 99, 107 80, 85 102-123 138   Mean/median:     109   POST-MEAL PC Breakfast PC Lunch PC Dinner  Glucose range:  204   Mean/median:       Previously  PRE-MEAL Fasting Lunch Dinner Overnight Overall  Glucose range: 61-104  71-93 53-236   Mean/median:  111    Glycemic control:   Lab Results  Component Value Date   HGBA1C 5.8 07/06/2021   HGBA1C 6.0 04/13/2021   HGBA1C 5.3 12/22/2020   Lab Results  Component Value Date   MICROALBUR 19.4 (H) 12/22/2020   LDLCALC 78 07/06/2021   CREATININE 2.20 (H) 07/06/2021    Self-care: The diet that the patient has been following is: tries to limit fats usually.      Meals: 2-3 meals per day. Breakfast is cereal or scrambled eggs/sausage.   Lunch is half sandwich, yogurt and fruit; dinner usually baked chicken, bread and vegetables, snacks are fruits or nuts. Not eating out regularly  Dietician visit: Most recent: 06/2015 CDE visit: 12/15              Weight history: Previous range 200-260  Wt Readings from Last 3 Encounters:  07/08/21 231 lb  (104.8 kg)  07/01/21 235 lb (106.6 kg)  05/10/21 234 lb 6.4 oz (106.3 kg)    Other active problems: discussed in review of systems   LABS:  Lab on 07/06/2021  Component Date Value Ref Range Status   Cholesterol 07/06/2021 135  0 - 200 mg/dL Final   ATP III Classification       Desirable:  < 200 mg/dL               Borderline High:  200 - 239 mg/dL          High:  > = 240 mg/dL   Triglycerides 07/06/2021 79.0  0.0 - 149.0 mg/dL Final   Normal:  <150 mg/dLBorderline High:  150 - 199 mg/dL   HDL 07/06/2021 41.20  >39.00 mg/dL Final   VLDL 07/06/2021 15.8  0.0 - 40.0 mg/dL Final   LDL Cholesterol 07/06/2021 78  0 - 99 mg/dL Final   Total CHOL/HDL Ratio 07/06/2021 3   Final                  Men          Women1/2 Average Risk     3.4          3.3Average Risk          5.0          4.42X Average Risk          9.6          7.13X Average Risk          15.0          11.0                       NonHDL 07/06/2021 93.78   Final   NOTE:  Non-HDL goal should be 30 mg/dL higher than patient's LDL goal (i.e. LDL goal of < 70 mg/dL, would have non-HDL goal of < 100 mg/dL)   Sodium 07/06/2021 141  135 - 145 mEq/L Final   Potassium 07/06/2021 4.4  3.5 - 5.1 mEq/L Final   Chloride 07/06/2021 104  96 - 112 mEq/L Final   CO2 07/06/2021 29  19 - 32 mEq/L Final   Glucose, Bld 07/06/2021 46 (LL)  70 - 99 mg/dL Final   BUN 07/06/2021 41 (H)  6 - 23 mg/dL Final   Creatinine, Ser 07/06/2021 2.20 (H)  0.40 - 1.20 mg/dL Final   Total Bilirubin 07/06/2021 0.7  0.2 - 1.2 mg/dL Final   Alkaline Phosphatase 07/06/2021 52  39 - 117 U/L Final   AST 07/06/2021 17  0 - 37 U/L Final   ALT 07/06/2021 17  0 - 35 U/L Final   Total Protein 07/06/2021 7.9  6.0 - 8.3 g/dL Final   Albumin 07/06/2021 4.3  3.5 - 5.2 g/dL Final   GFR 07/06/2021 22.59 (L)  >60.00 mL/min Final   Calculated using the CKD-EPI Creatinine Equation (2021)   Calcium 07/06/2021 9.9  8.4 - 10.5 mg/dL Final   Hgb A1c MFr Bld 07/06/2021 5.8  4.6 - 6.5 %  Final   Glycemic Control Guidelines for People with Diabetes:Non Diabetic:  <6%Goal of Therapy: <7%Additional Action Suggested:  >8%      Allergies as of 07/08/2021       Reactions   Avapro [irbesartan] Other (See Comments)   headaches   Codeine Nausea And Vomiting   Lisinopril Itching        Medication List        Accurate as of July 08, 2021  1:44 PM. If you have any questions, ask your nurse or doctor.          Accu-Chek FastClix Lancets Misc Use accu chek fastclix lancets to check blood sugar 2-3 times daily. DX:E11.65   Accu-Chek Guide Me w/Device Kit 1 each by Does not apply route 3 (three) times daily. Use accu chek guide me to check blood sugar 2-3 times daily. DX:E11.65   Accu-Chek Guide test strip Generic drug: glucose blood USE 1  TO CHECK GLUCOSE 2 TO 3 TIMES DAILY   acetaminophen 325 MG tablet Commonly known as: TYLENOL Take 2 tablets (650 mg total) by mouth every 6 (six) hours as needed for mild pain, fever or headache.   allopurinol 100 MG tablet Commonly known as: ZYLOPRIM Take 0.5 tablets (50 mg total) by mouth every other day.   ascorbic acid 500 MG tablet Commonly known as: VITAMIN C Take 1 tablet (500 mg total) by mouth 3 (three) times daily.   ferrous sulfate 325 (65 FE) MG tablet Take 1 tablet (325 mg total) by mouth 3 (three) times daily with meals. What changed: when to take this   furosemide 40 MG tablet Commonly known as: LASIX Take 1 tablet by mouth once daily   hydrocortisone valerate cream 0.2 % Commonly known as: WESTCORT Apply 1 application topically daily as needed (rash).   Insulin Lispro Prot & Lispro (75-25) 100 UNIT/ML Kwikpen Commonly known as: HumaLOG Mix 75/25 KwikPen 10 units before breakfast and 48 units before dinner.   labetalol 100 MG tablet Commonly known as: NORMODYNE Take 1 tablet by mouth twice daily   Ozempic (0.25 or 0.5 MG/DOSE) 2 MG/1.5ML Sopn Generic drug: Semaglutide(0.25 or 0.5MG/DOS) INJECT  0.5MG SUBCUTANEOUSLY ONCE A WEEK   potassium chloride 10 MEQ tablet Commonly known as: KLOR-CON M Take 1 tablet by mouth once daily   ReliOn Pen Needles 31G X 6 MM Misc Generic drug: Insulin Pen Needle USE 1  THREE TIMES DAILY What changed: Another medication with the same name was changed. Make sure you understand how and when to take each. Changed by: Elayne Snare, MD   BD Pen Needle Nano 2nd Gen 32G X 4 MM Misc Generic drug: Insulin Pen Needle USE 1  THREE TIMES DAILY What changed: See the new instructions. Changed by: Elayne Snare, MD   rosuvastatin 20 MG tablet Commonly known as: CRESTOR Take 1 tablet by mouth once daily   Tresiba FlexTouch 100 UNIT/ML FlexTouch Pen Generic drug: insulin degludec INJECT 14 UNITS SUBCUTANEOUSLY ONCE DAILY   valsartan 160 MG tablet Commonly  known as: DIOVAN Take 1 tablet by mouth once daily        Allergies:  Allergies  Allergen Reactions   Avapro [Irbesartan] Other (See Comments)    headaches   Codeine Nausea And Vomiting   Lisinopril Itching    Past Medical History:  Diagnosis Date   Abnormal Pap smear    Asthma    Diabetes mellitus    Hyperlipidemia    Hypertension    LGSIL (low grade squamous intraepithelial dysplasia)     Past Surgical History:  Procedure Laterality Date   ABDOMINAL HYSTERECTOMY     bladder surgery     BOWEL RESECTION N/A 10/23/2020   Procedure: SMALL BOWEL RESECTION;  Surgeon: Kinsinger, Arta [name], MD;  Location: [facility];  Service: General;  Laterality: N/A;   COLPOSCOPY     INCISIONAL HERNIA REPAIR N/A 10/23/2020   Procedure: OPEN HERNIA REPAIR INCISIONAL WITH MESH;  Surgeon: Kieth Brightly, Arta [name], MD;  Location: [facility];  Service: General;  Laterality: N/A;   INSERTION OF MESH N/A 10/23/2020   Procedure: INSERTION OF MESH;  Surgeon: Kieth Brightly Arta [name], MD;  Location: [place];  Service: General;  Laterality: N/A;   LAPAROTOMY N/A 10/23/2020   Procedure: EXPLORATORY LAPAROTOMY;  Surgeon: Mickeal Skinner, MD;  Location: [facility];  Service: General;  Laterality: N/A;    Family History  Problem Relation Age of Onset   Diabetes Mother    Diabetes Maternal Grandmother    Heart disease Neg Hx    Hypertension Neg Hx     Social History:  reports that she has never smoked. She has never used smokeless tobacco. She reports that she does not drink alcohol and does not use drugs.    Review of Systems    HYPERURICEMIA:  Uric acid has been significantly high in the past and she was given allopurinol since she was likely having gout previously Prescribed allopurinol 200 mg by the nephrologist  Lab Results  Component Value Date   LABURIC 6.4 05/10/2021     Hypertension:   She is taking valsartan 160 mg, and labetalol 100 mg However she is only taking labetalol  140s/70s  She is followed by nephrologist regularly   BP Readings from Last 3 Encounters:  07/08/21 (!) 142/80  07/01/21 (!) 158/78  05/10/21 (!) 162/74   Renal function has been variable as below She has been seen by  nephrologist regularly, however creatinine still relatively higher than before Creatinine was surprisingly down to 1.43 when seen by nephrologist in December after reducing her diuretics  On Lasix 20 for edema   Lab Results  Component Value Date   CREATININE 2.20 (H) 07/06/2021   CREATININE 2.15 (H) 04/13/2021   CREATININE 2.27 (H) 12/22/2020   Lab Results  Component Value Date   K 4.4 07/06/2021        Hypercholesterolemia:    LDL has been previously high, around 180 baseline She has been regular with her Crestor 20 mg dose and her LDL is at target        Lab Results  Component Value Date   CHOL 135 07/06/2021   CHOL 131 12/22/2020   CHOL 163 09/10/2020   Lab Results  Component Value Date   HDL 41.20 07/06/2021   HDL 36.10 (L) 12/22/2020   HDL 43.00 09/10/2020   Lab Results  Component Value Date   LDLCALC 78 07/06/2021   LDLCALC 59 12/22/2020   LDLCALC 94 09/10/2020   Lab  Results  Component Value  Date   TRIG 79.0 07/06/2021   TRIG 179.0 (H) 12/22/2020   TRIG 131.0 09/10/2020   Lab Results  Component Value Date   CHOLHDL 3 07/06/2021   CHOLHDL 4 12/22/2020   CHOLHDL 4 09/10/2020   Lab Results  Component Value Date   LDLDIRECT 89.0 12/05/2016   LDLDIRECT 183.0 10/01/2015                Diabetic foot exam last done in 50/53    Complications from diabetes: Proliferative retinopathy, microalbuminuria    Physical Examination:  BP (!) 142/80 (BP Location: Left Arm, Patient Position: Sitting, Cuff Size: Large)   Pulse 90   Ht _0  (1.6 m)   Wt 231 lb (104.8 kg)   SpO2 97%   BMI 40.92 kg/m   2+ pedal edema present    ASSESSMENT:  Diabetes type 2, with obesity, insulin requiring  See history of present illness for detailed discussion of his current management, blood sugar patterns and problems identified  Her A1c is 5.8 and stable   She is on premixed insulin twice a day, Tresiba, Ozempic 0.5 mg weekly  Overall doing very well with blood sugars fairly close to normal most of the time without recent hypoglycemia  Plan:  She needs to use a continuous glucose monitor such as freestyle libre so that she can get a better profile of her blood sugars and also be alerted to high and low sugars Prescription will be sent through parachute app She will also be trained when she gets this Meantime continue same doses of insulin unless starting to get low Reminded her not to take any insulin when she is fasting for lab work, prefer that she does not fast  Follow-up in 3 months  Hypertension: Blood pressure is generally better controlled and about the same at home She will continue to follow-up with her nephrologist also  RENAL dysfunction: This is relatively stable now  Edema: Mild and relatively better controlled now  LIPIDS: Well-controlled with LDL 78 on Crestor   Follow-up in 3 months   There are no Patient Instructions on file  for this visit.     Elayne Snare 07/08/2021, 1:44 PM   Note: This office note was prepared with Dragon voice recognition system technology. Any transcriptional errors that result from this process are unintentional.

## 2021-07-11 ENCOUNTER — Other Ambulatory Visit: Payer: Self-pay | Admitting: Endocrinology

## 2021-07-15 ENCOUNTER — Ambulatory Visit
Admission: RE | Admit: 2021-07-15 | Discharge: 2021-07-15 | Disposition: A | Discharge status: Home / Self Care | Payer: Medicare Other | Source: Ambulatory Visit | Attending: Obstetrics and Gynecology | Admitting: Obstetrics and Gynecology

## 2021-07-15 DIAGNOSIS — Z1231 Encounter for screening mammogram for malignant neoplasm of breast: Secondary | ICD-10-CM

## 2021-07-26 ENCOUNTER — Other Ambulatory Visit: Payer: Self-pay | Admitting: Endocrinology

## 2021-07-30 ENCOUNTER — Other Ambulatory Visit: Payer: Self-pay | Admitting: Endocrinology

## 2021-07-31 ENCOUNTER — Other Ambulatory Visit: Payer: Self-pay | Admitting: Endocrinology

## 2021-08-07 ENCOUNTER — Other Ambulatory Visit: Payer: Self-pay | Admitting: Endocrinology

## 2021-08-08 ENCOUNTER — Other Ambulatory Visit: Payer: Self-pay | Admitting: Endocrinology

## 2021-08-16 ENCOUNTER — Other Ambulatory Visit: Payer: Self-pay | Admitting: Endocrinology

## 2021-08-18 ENCOUNTER — Other Ambulatory Visit (HOSPITAL_COMMUNITY): Payer: Self-pay

## 2021-08-24 ENCOUNTER — Other Ambulatory Visit: Payer: Self-pay | Admitting: Endocrinology

## 2021-09-01 ENCOUNTER — Ambulatory Visit (INDEPENDENT_AMBULATORY_CARE_PROVIDER_SITE_OTHER): Payer: Medicare Other | Admitting: Family Medicine

## 2021-09-01 ENCOUNTER — Encounter: Payer: Self-pay | Admitting: Family Medicine

## 2021-09-01 VITALS — BP 148/72 | HR 71 | Temp 97.4°F | Ht 63.0 in | Wt 236.6 lb

## 2021-09-01 DIAGNOSIS — I10 Essential (primary) hypertension: Secondary | ICD-10-CM | POA: Diagnosis not present

## 2021-09-01 DIAGNOSIS — Z8739 Personal history of other diseases of the musculoskeletal system and connective tissue: Secondary | ICD-10-CM | POA: Diagnosis not present

## 2021-09-01 DIAGNOSIS — N184 Chronic kidney disease, stage 4 (severe): Secondary | ICD-10-CM

## 2021-09-01 LAB — BASIC METABOLIC PANEL WITH GFR
BUN: 40 mg/dL — ABNORMAL HIGH (ref 6–23)
CO2: 26 meq/L (ref 19–32)
Calcium: 9.5 mg/dL (ref 8.4–10.5)
Chloride: 105 meq/L (ref 96–112)
Creatinine, Ser: 1.96 mg/dL — ABNORMAL HIGH (ref 0.40–1.20)
GFR: 25.92 mL/min — ABNORMAL LOW
Glucose, Bld: 77 mg/dL (ref 70–99)
Potassium: 4.4 meq/L (ref 3.5–5.1)
Sodium: 141 meq/L (ref 135–145)

## 2021-09-01 LAB — CBC
HCT: 33.3 % — ABNORMAL LOW (ref 36.0–46.0)
Hemoglobin: 11 g/dL — ABNORMAL LOW (ref 12.0–15.0)
MCHC: 32.9 g/dL (ref 30.0–36.0)
MCV: 93.2 fl (ref 78.0–100.0)
Platelets: 204 K/uL (ref 150.0–400.0)
RBC: 3.57 Mil/uL — ABNORMAL LOW (ref 3.87–5.11)
RDW: 13.4 % (ref 11.5–15.5)
WBC: 6.3 K/uL (ref 4.0–10.5)

## 2021-09-01 NOTE — Progress Notes (Signed)
Established Patient Office Visit  Subjective   Patient ID: Stephanie [name], female    DOB: [birth year]  Age: 66 y.o. MRN: [id number]  Chief Complaint  Patient presents with   Follow-up    8 week follow up, no concerns.     HPI for follow-up of hypertension and CKD.  Has been feeling well.  Energy levels are okay.  Brings in documented list of blood pressures the majority of which are running in the 1 30-1 40 range over the 60-80 range.  She has had no headaches or blurred vision.  Continues with furosemide 40 mg every morning, labetalol 100 twice daily and valsartan 160 daily.  Has had no further gouty attacks on 50 mg of allopurinol every otherdail.  Diabetes is under great control with the last hemoglobin A1c of 5.8.  Would like to exercise more and lose some weight.  She has been referred to nutritionist but had decided not to go.  Continues to produce urine.  She has not seen a nephrologist.   Review of Systems  Constitutional:  Negative for chills, diaphoresis, malaise/fatigue and weight loss.  HENT: Negative.    Eyes: Negative.  Negative for blurred vision and double vision.  Respiratory: Negative.    Cardiovascular:  Negative for chest pain.  Gastrointestinal:  Negative for abdominal pain.  Genitourinary: Negative.   Musculoskeletal:  Negative for falls and myalgias.  Neurological:  Negative for speech change, loss of consciousness and weakness.  Psychiatric/Behavioral: Negative.        Objective:     BP (!) 148/72 (BP Location: Left Arm, Patient Position: Sitting, Cuff Size: Large)   Pulse 71   Temp (!) 97.4 F (36.3 C) (Temporal)   Ht _0  (1.6 m)   Wt 236 lb 9.6 oz (107.3 kg)   SpO2 94%   BMI 41.91 kg/m  BP Readings from Last 3 Encounters:  09/01/21 (!) 148/72  07/08/21 (!) 142/80  07/01/21 (!) 158/78   Wt Readings from Last 3 Encounters:  09/01/21 236 lb 9.6 oz (107.3 kg)  07/08/21 231 lb (104.8 kg)  07/01/21 235 lb (106.6 kg)      Physical  Exam Constitutional:      General: She is not in acute distress.    Appearance: Normal appearance. She is not ill-appearing, toxic-appearing or diaphoretic.  HENT:     Head: Normocephalic and atraumatic.     Right Ear: External ear normal.     Left Ear: External ear normal.     Mouth/Throat:     Mouth: Mucous membranes are moist.     Pharynx: Oropharynx is clear. No oropharyngeal exudate or posterior oropharyngeal erythema.  Eyes:     General: No scleral icterus.       Right eye: No discharge.        Left eye: No discharge.     Extraocular Movements: Extraocular movements intact.     Conjunctiva/sclera: Conjunctivae normal.     Pupils: Pupils are equal, round, and reactive to light.  Cardiovascular:     Rate and Rhythm: Normal rate and regular rhythm.  Pulmonary:     Effort: Pulmonary effort is normal. No respiratory distress.     Breath sounds: Normal breath sounds.  Abdominal:     General: Bowel sounds are normal.     Tenderness: There is no abdominal tenderness. There is no guarding.  Musculoskeletal:     Cervical back: No rigidity or tenderness.     Right lower leg: No edema.  Left lower leg: No edema.  Skin:    General: Skin is warm and dry.  Neurological:     Mental Status: She is alert and oriented to person, place, and time.  Psychiatric:        Mood and Affect: Mood normal.        Behavior: Behavior normal.      No results found for any visits on 09/01/21.    The 10-year ASCVD risk score (Arnett DK, et al., 2019) is: 22.5%    Assessment & Plan:   Problem List Items Addressed This Visit       Cardiovascular and Mediastinum   Essential hypertension   Relevant Orders   Basic metabolic panel   CBC     Genitourinary   Stage 4 chronic kidney disease ([place]) - Primary   Relevant Orders   Basic metabolic panel   Ambulatory referral to Nephrology   Multiple Myeloma Panel (SPEP&IFE w/QIG)     Other   History of gout    Return in about 3 months  (around 12/02/2021).  Continue all medicines as above.  Encouraged her to exercise and try to lose some weight.  Nephrology consultation requested.  Libby Maw, MD

## 2021-09-03 ENCOUNTER — Other Ambulatory Visit: Payer: Self-pay | Admitting: Endocrinology

## 2021-09-05 ENCOUNTER — Other Ambulatory Visit: Payer: Self-pay | Admitting: Endocrinology

## 2021-09-06 LAB — MULTIPLE MYELOMA PANEL, SERUM
Albumin SerPl Elph-Mcnc: 3.6 g/dL (ref 2.9–4.4)
Albumin/Glob SerPl: 1.1 (ref 0.7–1.7)
Alpha 1: 0.2 g/dL (ref 0.0–0.4)
Alpha2 Glob SerPl Elph-Mcnc: 0.8 g/dL (ref 0.4–1.0)
B-Globulin SerPl Elph-Mcnc: 1.1 g/dL (ref 0.7–1.3)
Gamma Glob SerPl Elph-Mcnc: 1.3 g/dL (ref 0.4–1.8)
Globulin, Total: 3.4 g/dL (ref 2.2–3.9)
IgA/Immunoglobulin A, Serum: 162 mg/dL (ref 87–352)
IgG (Immunoglobin G), Serum: 1353 mg/dL (ref 586–1602)
IgM (Immunoglobulin M), Srm: 129 mg/dL (ref 26–217)
Total Protein: 7 g/dL (ref 6.0–8.5)

## 2021-09-17 ENCOUNTER — Encounter: Payer: Self-pay | Admitting: Family Medicine

## 2021-09-21 ENCOUNTER — Other Ambulatory Visit: Payer: Self-pay | Admitting: Endocrinology

## 2021-10-03 ENCOUNTER — Other Ambulatory Visit: Payer: Self-pay | Admitting: Endocrinology

## 2021-10-14 ENCOUNTER — Other Ambulatory Visit: Payer: Self-pay | Admitting: Endocrinology

## 2021-10-17 ENCOUNTER — Other Ambulatory Visit: Payer: Self-pay | Admitting: Endocrinology

## 2021-11-09 ENCOUNTER — Other Ambulatory Visit: Payer: Self-pay | Admitting: Endocrinology

## 2021-11-10 ENCOUNTER — Other Ambulatory Visit (INDEPENDENT_AMBULATORY_CARE_PROVIDER_SITE_OTHER): Payer: Medicare Other

## 2021-11-10 DIAGNOSIS — Z794 Long term (current) use of insulin: Secondary | ICD-10-CM | POA: Diagnosis not present

## 2021-11-10 DIAGNOSIS — E1165 Type 2 diabetes mellitus with hyperglycemia: Secondary | ICD-10-CM | POA: Diagnosis not present

## 2021-11-10 LAB — HEMOGLOBIN A1C: Hgb A1c MFr Bld: 6.1 % (ref 4.6–6.5)

## 2021-11-10 LAB — BASIC METABOLIC PANEL
BUN: 25 mg/dL — ABNORMAL HIGH (ref 6–23)
CO2: 30 mEq/L (ref 19–32)
Calcium: 9.6 mg/dL (ref 8.4–10.5)
Chloride: 105 mEq/L (ref 96–112)
Creatinine, Ser: 1.73 mg/dL — ABNORMAL HIGH (ref 0.40–1.20)
GFR: 30.07 mL/min — ABNORMAL LOW (ref >60.00)
Glucose, Bld: 79 mg/dL (ref 70–99)
Potassium: 4.4 mEq/L (ref 3.5–5.1)
Sodium: 141 mEq/L (ref 135–145)

## 2021-11-11 ENCOUNTER — Other Ambulatory Visit: Payer: Self-pay | Admitting: Endocrinology

## 2021-11-11 LAB — FRUCTOSAMINE: Fructosamine: 216 umol/L (ref 0–285)

## 2021-11-12 ENCOUNTER — Encounter: Payer: Self-pay | Admitting: Endocrinology

## 2021-11-12 ENCOUNTER — Ambulatory Visit (INDEPENDENT_AMBULATORY_CARE_PROVIDER_SITE_OTHER): Payer: Medicare Other | Admitting: Endocrinology

## 2021-11-12 VITALS — BP 156/90 | HR 89 | Ht 63.0 in | Wt 238.0 lb

## 2021-11-12 DIAGNOSIS — E1121 Type 2 diabetes mellitus with diabetic nephropathy: Secondary | ICD-10-CM

## 2021-11-12 DIAGNOSIS — E1165 Type 2 diabetes mellitus with hyperglycemia: Secondary | ICD-10-CM | POA: Diagnosis not present

## 2021-11-12 DIAGNOSIS — E782 Mixed hyperlipidemia: Secondary | ICD-10-CM

## 2021-11-12 DIAGNOSIS — I10 Essential (primary) hypertension: Secondary | ICD-10-CM

## 2021-11-12 DIAGNOSIS — Z794 Long term (current) use of insulin: Secondary | ICD-10-CM | POA: Diagnosis not present

## 2021-11-12 NOTE — Patient Instructions (Addendum)
Take 6 units insulin in am  Protein at bfst  Take 2 Labetalol in pms   Exercise daily

## 2021-11-12 NOTE — Progress Notes (Unsigned)
Patient ID: Stephanie [name], female   DOB: 24-Mar-1955, 66 y.o.   MRN: [id number]           Reason for Appointment: Followup   History of Present Illness:          Diagnosis: Type 2 diabetes mellitus, date of diagnosis: 2003       Past history:  She was initially treated metformin at some point glimepiride also added and not clear what her level of control was in the first few years. Had been only taking 500 mg twice a day of metformin and Amaryl increased to 4 mg twice a day several years ago In the last few years her blood sugar control has been more difficult and she has been managed by an endocrinologist Because of poor control she had been tried on Janumet and Ovilla but she states that she could not tolerate them because of headaches Had poor control with Amaryl and metformin and also difficulty with weight gain she had started Trulicity in 20/3559 on her initial consultation Her weight on her initial visit was 240 pounds. Her A1c had gone down to 7.1% in 74/16 with using Trulicity which she was tolerating at that time However in 2/16 she stopped taking Trulicity as it was causing nausea , subsequently did not want to go on Tanzeum Because of markedly increased sugars she has been on premixed insulin since 4/16  Recent history:   INSULIN DOSE: NovoLog mix 70/30, 8 before breakfast and 32 units before supper  Tresiba 14 units daily  Oral hypoglycemic drugs the patient is taking are:, Ozempic 0.5 mg weekly   A1c is now 6.1 compared to 5.8  Current diabetes history, management, problems and blood sugar patterns:   Her insulin doses were not changed much on her last visit She is checking her blood sugars as below mostly overnight and not clear if her fasting readings are controlled She is very apprehensive about trying a continuous glucose sensor as she does not think she can handle this, also does not want any object on her body She may not be consistently watching her  diet Also not taking readings couple of hours after meals but at bedtime or during the night Her weight is 7 pounds more than on her visit in June Still not starting a walking program  Frequently will just have some fruit in the morning without protein and may tend to have readings as low as 62 after breakfast    Side effects from medications have been: Januvia, Onglyza apparently caused headaches, Invokana: Candidiasis   Compliance with the medical regimen: Fair    Dinner is usually at 7 pm  Glucose monitoring:  done 1 times a day or more       Glucometer:  Accu-Chek guide .      Blood Glucose readings by meter review with average readings for 30 days:   PRE-MEAL Fasting Lunch Dinner Bedtime Overall  Glucose range: 78, 101   108-161   Mean/median:     125   POST-MEAL PC Breakfast PC Lunch PC Dinner  Glucose range: 62, 104  ?  Mean/median:      Previously  PRE-MEAL Fasting Lunch Dinner Bedtime Overall  Glucose range: 99, 107 80, 85 102-123 138   Mean/median:     109   POST-MEAL PC Breakfast PC Lunch PC Dinner  Glucose range:  204   Mean/median:       Previously  PRE-MEAL Fasting Lunch Dinner Overnight Overall  Glucose  range: 61-104  71-93 53-236   Mean/median:     111    Glycemic control:   Lab Results  Component Value Date   HGBA1C 6.1 11/10/2021   HGBA1C 5.8 07/06/2021   HGBA1C 6.0 04/13/2021   Lab Results  Component Value Date   MICROALBUR 19.4 (H) 12/22/2020   LDLCALC 78 07/06/2021   CREATININE 1.73 (H) 11/10/2021    Self-care: The diet that the patient has been following is: tries to limit fats usually.      Meals: 2-3 meals per day. Breakfast is cereal or scrambled eggs/sausage.   Lunch is half sandwich, yogurt and fruit; dinner usually baked chicken, bread and vegetables, snacks are fruits or nuts. Not eating out regularly  Dietician visit: Most recent: 06/2015 CDE visit: 12/15              Weight history: Previous range 200-260  Wt Readings  from Last 3 Encounters:  11/12/21 238 lb (108 kg)  09/01/21 236 lb 9.6 oz (107.3 kg)  07/08/21 231 lb (104.8 kg)    Other active problems: discussed in review of systems   LABS:  Lab on 11/10/2021  Component Date Value Ref Range Status   Sodium 11/10/2021 141  135 - 145 mEq/L Final   Potassium 11/10/2021 4.4  3.5 - 5.1 mEq/L Final   Chloride 11/10/2021 105  96 - 112 mEq/L Final   CO2 11/10/2021 30  19 - 32 mEq/L Final   Glucose, Bld 11/10/2021 79  70 - 99 mg/dL Final   BUN 11/10/2021 25 (H)  6 - 23 mg/dL Final   Creatinine, Ser 11/10/2021 1.73 (H)  0.40 - 1.20 mg/dL Final   GFR 11/10/2021 30.07 (L)  >60.00 mL/min Final   Calculated using the CKD-EPI Creatinine Equation (2021)   Calcium 11/10/2021 9.6  8.4 - 10.5 mg/dL Final   Hgb A1c MFr Bld 11/10/2021 6.1  4.6 - 6.5 % Final   Glycemic Control Guidelines for People with Diabetes:Non Diabetic:  <6%Goal of Therapy: <7%Additional Action Suggested:  >8%    Fructosamine 11/10/2021 216  0 - 285 umol/L Final   Comment: Published reference interval for apparently healthy subjects between age 67 and 1 is 62 - 285 umol/L and in a poorly controlled diabetic population is 228 - 563 umol/L with a mean of 396 umol/L.      Allergies as of 11/12/2021       Reactions   Avapro [irbesartan] Other (See Comments)   headaches   Codeine Nausea And Vomiting   Lisinopril Itching        Medication List        Accurate as of November 12, 2021 10:41 AM. If you have any questions, ask your nurse or doctor.          Accu-Chek FastClix Lancets Misc Use accu chek fastclix lancets to check blood sugar 2-3 times daily. DX:E11.65   Accu-Chek Guide Me w/Device Kit 1 each by Does not apply route 3 (three) times daily. Use accu chek guide me to check blood sugar 2-3 times daily. DX:E11.65   Accu-Chek Guide test strip Generic drug: glucose blood USE 1  TO CHECK GLUCOSE 2 TO 3 TIMES DAILY   acetaminophen 325 MG tablet Commonly known as:  TYLENOL Take 2 tablets (650 mg total) by mouth every 6 (six) hours as needed for mild pain, fever or headache.   allopurinol 100 MG tablet Commonly known as: ZYLOPRIM Take 0.5 tablets (50 mg total) by mouth every other day.  ascorbic acid 500 MG tablet Commonly known as: VITAMIN C Take 1 tablet (500 mg total) by mouth 3 (three) times daily.   ferrous sulfate 325 (65 FE) MG tablet Take 1 tablet (325 mg total) by mouth 3 (three) times daily with meals. What changed: when to take this   furosemide 40 MG tablet Commonly known as: LASIX Take 1 tablet by mouth once daily   hydrocortisone valerate cream 0.2 % Commonly known as: WESTCORT Apply 1 application topically daily as needed (rash).   Insulin Lispro Prot & Lispro (75-25) 100 UNIT/ML Kwikpen Commonly known as: HumaLOG Mix 75/25 KwikPen 10 units before breakfast and 48 units before dinner. What changed: additional instructions   labetalol 100 MG tablet Commonly known as: NORMODYNE Take 1 tablet by mouth twice daily   Ozempic (0.25 or 0.5 MG/DOSE) 2 MG/1.5ML Sopn Generic drug: Semaglutide(0.25 or 0.5MG/DOS) INJECT 0.5MG SUBCUTANEOUSLY ONCE A WEEK   Ozempic (0.25 or 0.5 MG/DOSE) 2 MG/3ML Sopn Generic drug: Semaglutide(0.25 or 0.5MG/DOS) INJECT 0.5MG INTO THE SKIN ONCE WEEKLY   potassium chloride 10 MEQ tablet Commonly known as: KLOR-CON M Take 1 tablet by mouth once daily   ReliOn Pen Needles 31G X 6 MM Misc Generic drug: Insulin Pen Needle USE 1  THREE TIMES DAILY   BD Pen Needle Nano 2nd Gen 32G X 4 MM Misc Generic drug: Insulin Pen Needle USE 1  THREE TIMES DAILY   rosuvastatin 20 MG tablet Commonly known as: CRESTOR Take 1 tablet by mouth once daily   Tresiba FlexTouch 100 UNIT/ML FlexTouch Pen Generic drug: insulin degludec INJECT 24 UNITS SUBCUTANEOUSLY ONCE DAILY What changed: additional instructions   valsartan 160 MG tablet Commonly known as: DIOVAN Take 1 tablet by mouth once daily         Allergies:  Allergies  Allergen Reactions   Avapro [Irbesartan] Other (See Comments)    headaches   Codeine Nausea And Vomiting   Lisinopril Itching    Past Medical History:  Diagnosis Date   Abnormal Pap smear    Asthma    Diabetes mellitus    Hyperlipidemia    Hypertension    LGSIL (low grade squamous intraepithelial dysplasia)     Past Surgical History:  Procedure Laterality Date   ABDOMINAL HYSTERECTOMY     bladder surgery     BOWEL RESECTION N/A 10/23/2020   Procedure: SMALL BOWEL RESECTION;  Surgeon: Kinsinger, Arta [name], MD;  Location: [facility];  Service: General;  Laterality: N/A;   COLPOSCOPY     INCISIONAL HERNIA REPAIR N/A 10/23/2020   Procedure: OPEN HERNIA REPAIR INCISIONAL WITH MESH;  Surgeon: Kieth Brightly, Arta [name], MD;  Location: [facility];  Service: General;  Laterality: N/A;   INSERTION OF MESH N/A 10/23/2020   Procedure: INSERTION OF MESH;  Surgeon: Kieth Brightly Arta [name], MD;  Location: [facility];  Service: General;  Laterality: N/A;   LAPAROTOMY N/A 10/23/2020   Procedure: EXPLORATORY LAPAROTOMY;  Surgeon: Mickeal Skinner, MD;  Location: [facility];  Service: General;  Laterality: N/A;    Family History  Problem Relation Age of Onset   Diabetes Mother    Diabetes Maternal Grandmother    Heart disease Neg Hx    Hypertension Neg Hx     Social History:  reports that she has never smoked. She has never used smokeless tobacco. She reports that she does not drink alcohol and does not use drugs.    Review of Systems    HYPERURICEMIA:  Uric acid has been significantly high in  the past and she was given allopurinol since she was likely having gout previously Prescribed allopurinol 200 mg by the nephrologist  Lab Results  Component Value Date   LABURIC 6.4 05/10/2021     Hypertension:   She is taking valsartan 160 mg, and also labetalol 100 mg, she thinks she is taking his regularly twice a day Home BP 140s-150s/80s, 70s  She is followed by  nephrologist regularly   BP Readings from Last 3 Encounters:  11/12/21 (!) 156/90  09/01/21 (!) 148/72  07/08/21 (!) 142/80   Renal function has been variable as below She has been seen by  nephrologist regularly, however creatinine appears to be variable but generally better  On Lasix 20 for edema   Lab Results  Component Value Date   CREATININE 1.73 (H) 11/10/2021   CREATININE 1.96 (H) 09/01/2021   CREATININE 2.20 (H) 07/06/2021   Lab Results  Component Value Date   K 4.4 11/10/2021        Hypercholesterolemia:    LDL has been previously high, around 180 baseline She has been regular with her Crestor 20 mg dose and her LDL is at target        Lab Results  Component Value Date   CHOL 135 07/06/2021   CHOL 131 12/22/2020   CHOL 163 09/10/2020   Lab Results  Component Value Date   HDL 41.20 07/06/2021   HDL 36.10 (L) 12/22/2020   HDL 43.00 09/10/2020   Lab Results  Component Value Date   LDLCALC 78 07/06/2021   LDLCALC 59 12/22/2020   LDLCALC 94 09/10/2020   Lab Results  Component Value Date   TRIG 79.0 07/06/2021   TRIG 179.0 (H) 12/22/2020   TRIG 131.0 09/10/2020   Lab Results  Component Value Date   CHOLHDL 3 07/06/2021   CHOLHDL 4 12/22/2020   CHOLHDL 4 09/10/2020   Lab Results  Component Value Date   LDLDIRECT 89.0 12/05/2016   LDLDIRECT 183.0 10/01/2015                Diabetic foot exam last done in 49/70    Complications from diabetes: Proliferative retinopathy, microalbuminuria    Physical Examination:  BP (!) 156/90 (BP Location: Left Arm, Patient Position: Sitting, Cuff Size: Normal)   Pulse 89   Ht _0  (1.6 m)   Wt 238 lb (108 kg)   SpO2 98%   BMI 42.16 kg/m   2+ pedal edema present    ASSESSMENT:  Diabetes type 2, with obesity, insulin requiring  See history of present illness for detailed discussion of his current management, blood sugar patterns and problems identified  Her A1c is 6.1 and stable   She is on  premixed insulin twice a day, Tresiba, Ozempic 0.5 mg weekly  Although she has gained weight she has mostly fairly good blood sugars and not clear why she has not benefited as much from [place] on this visit She refuses to consider CGM although this would be helpful with assessing blood sugar patterns, adjusting insulin and educating her about different dietary factors Still does not try to exercise and discussed that this is important for weight control  Plan:   She needs to check her blood sugars specifically on waking up and 2 hours after meals instead of current regimen and discussed importance of doing this to help adjust the insulin as well as adjust her diet if needed  She does need to add a protein at breakfast consistently and discussed that  this is safe In the meantime reduce morning insulin to 6 units but if her readings after breakfast are consistently low may consider stopping this Also she needs to let us know if her fasting readings start getting low She does need to start a walking program or other exercise such as indoor aerobics and emphasized the need for weight loss with lifestyle changes that she is not doing  Consider follow-up with diabetes educator   Hypertension: Blood pressure is significantly high and she thinks they are higher in the morning also with headaches She will increase her labetalol in the evening to 200 mg for now  She will continue to follow-up with her nephrologist also  RENAL dysfunction: This is relatively better now  LIPIDS: To be rechecked on the next visit   Follow-up in 3 months   There are no Patient Instructions on file for this visit.     Elayne Snare 11/12/2021, 10:41 AM   Note: This office note was prepared with Dragon voice recognition system technology. Any transcriptional errors that result from this process are unintentional.

## 2021-12-01 ENCOUNTER — Telehealth: Payer: Self-pay | Admitting: Nutrition

## 2021-12-01 NOTE — Telephone Encounter (Signed)
Patient reports that she called the pump company and they explained to her how to prime the Humulin pen before each use.  She has been doing this, and has reported no more difficulty with the pen malfunctioning.

## 2021-12-02 ENCOUNTER — Encounter: Payer: Self-pay | Admitting: Family Medicine

## 2021-12-02 ENCOUNTER — Ambulatory Visit (INDEPENDENT_AMBULATORY_CARE_PROVIDER_SITE_OTHER): Payer: Medicare Other | Admitting: Family Medicine

## 2021-12-02 VITALS — BP 138/95 | HR 80 | Temp 97.6°F | Resp 17 | Wt 238.0 lb

## 2021-12-02 DIAGNOSIS — N1832 Chronic kidney disease, stage 3b: Secondary | ICD-10-CM

## 2021-12-02 DIAGNOSIS — Z23 Encounter for immunization: Secondary | ICD-10-CM | POA: Diagnosis not present

## 2021-12-02 DIAGNOSIS — I10 Essential (primary) hypertension: Secondary | ICD-10-CM

## 2021-12-02 DIAGNOSIS — J4520 Mild intermittent asthma, uncomplicated: Secondary | ICD-10-CM

## 2021-12-02 MED ORDER — ALBUTEROL SULFATE HFA 108 (90 BASE) MCG/ACT IN AERS: 2.0000 | INHALATION_SPRAY | Freq: Four times a day (QID) | RESPIRATORY_TRACT | 0 refills | Status: AC | PRN

## 2021-12-02 MED ORDER — AMLODIPINE BESYLATE 5 MG PO TABS: 5.0000 mg | ORAL_TABLET | Freq: Every day | ORAL | 1 refills | Status: DC

## 2021-12-02 NOTE — Progress Notes (Signed)
Established Patient Office Visit  Subjective   Patient ID: Stephanie [name], female    DOB: 05/18/1955  Age: 66 y.o. MRN: [id number]  Chief Complaint  Patient presents with   Follow-up    70mo follow up disposition -- pt needs needs refill on inhaler     HPI for follow-up of hypertension, CKD stage IIIb, intermittent asthma, and a flu shot.  Continues labetalol 100 mg, valsartan 160, furosemide 40 daily.  Blood pressures from home are running in the 140-160/71-87.  She has been compliant with her medications.  Recently saw endocrinology.  Recheck on kidney function showed improvement with GFR.  She had been at stage IV over the last several months.  Developed mild asthma at age 11.  She uses an albuterol inhaler as needed when the weather changes typically from warm to cold.  Never smoked.  She would like a refill.    Review of Systems  Constitutional: Negative.   HENT: Negative.    Eyes:  Negative for blurred vision, discharge and redness.  Respiratory: Negative.    Cardiovascular: Negative.   Gastrointestinal:  Negative for abdominal pain.  Genitourinary: Negative.   Musculoskeletal: Negative.  Negative for myalgias.  Skin:  Negative for rash.  Neurological:  Negative for tingling, loss of consciousness and weakness.  Endo/Heme/Allergies:  Negative for polydipsia.      Objective:     BP (!) 138/95 (BP Location: Left Arm, Patient Position: Sitting, Cuff Size: Large)   Pulse 80   Temp 97.6 F (36.4 C) (Temporal)   Resp 17   Wt 238 lb (108 kg)   SpO2 95%   BMI 42.16 kg/m  BP Readings from Last 3 Encounters:  12/02/21 (!) 138/95  11/12/21 (!) 156/90  09/01/21 (!) 148/72   Wt Readings from Last 3 Encounters:  12/02/21 238 lb (108 kg)  11/12/21 238 lb (108 kg)  09/01/21 236 lb 9.6 oz (107.3 kg)      Physical Exam Constitutional:      General: She is not in acute distress.    Appearance: Normal appearance. She is not ill-appearing, toxic-appearing or diaphoretic.   HENT:     Head: Normocephalic and atraumatic.     Right Ear: External ear normal.     Left Ear: External ear normal.     Mouth/Throat:     Mouth: Mucous membranes are moist.     Pharynx: Oropharynx is clear. No oropharyngeal exudate or posterior oropharyngeal erythema.  Eyes:     General: No scleral icterus.       Right eye: No discharge.        Left eye: No discharge.     Extraocular Movements: Extraocular movements intact.     Conjunctiva/sclera: Conjunctivae normal.     Pupils: Pupils are equal, round, and reactive to light.  Cardiovascular:     Rate and Rhythm: Normal rate and regular rhythm.  Pulmonary:     Effort: Pulmonary effort is normal. No respiratory distress.     Breath sounds: Normal breath sounds. No wheezing or rales.  Musculoskeletal:     Cervical back: No rigidity or tenderness.  Lymphadenopathy:     Cervical: No cervical adenopathy.  Skin:    General: Skin is warm and dry.  Neurological:     Mental Status: She is alert and oriented to person, place, and time.  Psychiatric:        Mood and Affect: Mood normal.        Behavior: Behavior normal.  No results found for any visits on 12/02/21.    The 10-year ASCVD risk score (Arnett DK, et al., 2019) is: 20.1%    Assessment & Plan:   Problem List Items Addressed This Visit       Cardiovascular and Mediastinum   Essential hypertension, benign   Relevant Medications   amLODipine (NORVASC) 5 MG tablet     Genitourinary   Stage 3b chronic kidney disease (Home)     Other   Need for influenza vaccination - Primary   Relevant Orders   Flu Vaccine QUAD High Dose(Fluad)   Other Visit Diagnoses     Mild intermittent reactive airway disease without complication       Relevant Medications   albuterol (VENTOLIN HFA) 108 (90 Base) MCG/ACT inhaler       Return in about 3 months (around 03/04/2022).  Have added 5 mg of amlodipine to current dosing with labetalol, furosemide and losartan.  She has  follow-up with nephrology next week.  Advised RSV vaccine through her pharmacist.  Flu vaccine today.  We will monitor albuterol usage.  Libby Maw, MD

## 2021-12-03 ENCOUNTER — Other Ambulatory Visit: Payer: Self-pay | Admitting: Endocrinology

## 2021-12-08 DIAGNOSIS — K439 Ventral hernia without obstruction or gangrene: Secondary | ICD-10-CM | POA: Diagnosis not present

## 2021-12-08 DIAGNOSIS — N1832 Chronic kidney disease, stage 3b: Secondary | ICD-10-CM | POA: Diagnosis not present

## 2021-12-08 DIAGNOSIS — I129 Hypertensive chronic kidney disease with stage 1 through stage 4 chronic kidney disease, or unspecified chronic kidney disease: Secondary | ICD-10-CM | POA: Diagnosis not present

## 2021-12-08 DIAGNOSIS — N179 Acute kidney failure, unspecified: Secondary | ICD-10-CM | POA: Diagnosis not present

## 2021-12-08 DIAGNOSIS — E1122 Type 2 diabetes mellitus with diabetic chronic kidney disease: Secondary | ICD-10-CM | POA: Diagnosis not present

## 2021-12-08 DIAGNOSIS — Z794 Long term (current) use of insulin: Secondary | ICD-10-CM | POA: Diagnosis not present

## 2021-12-08 DIAGNOSIS — N390 Urinary tract infection, site not specified: Secondary | ICD-10-CM | POA: Diagnosis not present

## 2021-12-20 ENCOUNTER — Encounter (HOSPITAL_BASED_OUTPATIENT_CLINIC_OR_DEPARTMENT_OTHER): Payer: Self-pay | Admitting: Emergency Medicine

## 2021-12-20 ENCOUNTER — Other Ambulatory Visit: Payer: Self-pay

## 2021-12-20 ENCOUNTER — Emergency Department (HOSPITAL_BASED_OUTPATIENT_CLINIC_OR_DEPARTMENT_OTHER)
Admission: EM | Admit: 2021-12-20 | Discharge: 2021-12-20 | Disposition: A | Discharge status: Home / Self Care | Payer: Medicare Other | Attending: Emergency Medicine | Admitting: Emergency Medicine

## 2021-12-20 DIAGNOSIS — R059 Cough, unspecified: Secondary | ICD-10-CM | POA: Diagnosis present

## 2021-12-20 DIAGNOSIS — Z20822 Contact with and (suspected) exposure to covid-19: Secondary | ICD-10-CM | POA: Insufficient documentation

## 2021-12-20 DIAGNOSIS — R051 Acute cough: Secondary | ICD-10-CM | POA: Diagnosis not present

## 2021-12-20 LAB — RESP PANEL BY RT-PCR (FLU A&B, COVID) ARPGX2
Influenza A by PCR: NEGATIVE
Influenza B by PCR: NEGATIVE
SARS Coronavirus 2 by RT PCR: NEGATIVE

## 2021-12-20 NOTE — ED Provider Notes (Signed)
[facility] EMERGENCY DEPARTMENT Provider Note   CSN: [id number] Arrival date & time: 12/20/21  1408     History  Chief Complaint  Patient presents with   Cough    Stephanie [name] is a 66 y.o. female.  Patient here for COVID test after COVID exposure.  She is asymptomatic.  No fever.  Here with family member who has symptoms.  She denies any cough or sputum production.  No chest pain or weakness.  The history is provided by the patient.       Home Medications Prior to Admission medications   Medication Sig Start Date End Date Taking? Authorizing Provider  Accu-Chek FastClix Lancets MISC Use accu chek fastclix lancets to check blood sugar 2-3 times daily. DX:E11.65 01/30/19   Elayne Snare, MD  ACCU-CHEK GUIDE test strip USE 1  TO CHECK GLUCOSE 2 TO 3 TIMES DAILY 06/02/21   Elayne Snare, MD  acetaminophen (TYLENOL) 325 MG tablet Take 2 tablets (650 mg total) by mouth every 6 (six) hours as needed for mild pain, fever or headache. 10/31/20   [name], PA-C  albuterol (VENTOLIN HFA) 108 (90 Base) MCG/ACT inhaler Inhale 2 puffs into the lungs every 6 (six) hours as needed for wheezing or shortness of breath. 12/02/21   Libby Maw, MD  allopurinol (ZYLOPRIM) 100 MG tablet Take 0.5 tablets (50 mg total) by mouth every other day. 05/10/21   Libby Maw, MD  amLODipine (NORVASC) 5 MG tablet Take 1 tablet (5 mg total) by mouth daily. 12/02/21   Libby Maw, MD  ascorbic acid (VITAMIN C) 500 MG tablet Take 1 tablet (500 mg total) by mouth 3 (three) times daily. 10/31/20   [name], PA-C  BD PEN NEEDLE NANO 2ND GEN 32G X 4 MM MISC USE 1  THREE TIMES DAILY 08/09/21   Elayne Snare, MD  Blood Glucose Monitoring Suppl (ACCU-CHEK GUIDE ME) w/Device KIT 1 each by Does not apply route 3 (three) times daily. Use accu chek guide me to check blood sugar 2-3 times daily. DX:E11.65 01/30/19   Elayne Snare, MD  ferrous sulfate 325 (65 FE) MG tablet Take 1  tablet (325 mg total) by mouth 3 (three) times daily with meals. Patient taking differently: Take 325 mg by mouth daily. 10/31/20   [name], PA-C  furosemide (LASIX) 40 MG tablet Take 1 tablet by mouth once daily 07/26/21   Elayne Snare, MD  hydrocortisone valerate cream (WESTCORT) 0.2 % Apply 1 application topically daily as needed (rash). 06/28/13   [provider]  insulin degludec (TRESIBA FLEXTOUCH) 100 UNIT/ML FlexTouch Pen INJECT 24 UNITS SUBCUTANEOUSLY ONCE DAILY Patient taking differently: INJECT 14 UNITS SUBCUTANEOUSLY ONCE DAILY 10/18/21   Elayne Snare, MD  Insulin Lispro Prot & Lispro (HUMALOG MIX 75/25 KWIKPEN) (75-25) 100 UNIT/ML Kwikpen INJECT 10 UNITS SUBCUTANEOUSLY BEFORE BREAKFAST AND 48 BEFORE SUPPER 12/03/21   Elayne Snare, MD  Insulin Pen Needle (RELION PEN NEEDLES) 31G X 6 MM MISC USE 1  THREE TIMES DAILY 10/02/20   Elayne Snare, MD  labetalol (NORMODYNE) 100 MG tablet Take 1 tablet by mouth twice daily 09/07/21   Elayne Snare, MD  OZEMPIC, 0.25 OR 0.5 MG/DOSE, 2 MG/1.5ML SOPN INJECT 0.5MG SUBCUTANEOUSLY ONCE A WEEK 03/16/21   Elayne Snare, MD  OZEMPIC, 0.25 OR 0.5 MG/DOSE, 2 MG/3ML SOPN INJECT 0.5MG INTO THE SKIN ONCE WEEKLY 11/09/21   Elayne Snare, MD  potassium chloride (KLOR-CON M) 10 MEQ tablet Take 1 tablet by  mouth once daily 10/04/21   Elayne Snare, MD  rosuvastatin (CRESTOR) 20 MG tablet Take 1 tablet by mouth once daily 11/11/21   Elayne Snare, MD  valsartan (DIOVAN) 160 MG tablet Take 1 tablet by mouth once daily 09/07/21   Elayne Snare, MD      Allergies    Avapro [irbesartan], Codeine, and Lisinopril    Review of Systems   Review of Systems  Physical Exam Updated Vital Signs BP (!) 180/92 (BP Location: Right Arm)   Pulse 80   Temp 98.5 F (36.9 C) (Oral)   Resp 18   Ht _0  (1.6 m)   Wt 108.9 kg   SpO2 98%   BMI 42.51 kg/m  Physical Exam Vitals and nursing note reviewed.  Constitutional:      General: She is not in acute distress.    Appearance:  She is well-developed.  HENT:     Head: Normocephalic and atraumatic.     Nose: Nose normal.     Mouth/Throat:     Mouth: Mucous membranes are moist.  Eyes:     Extraocular Movements: Extraocular movements intact.     Conjunctiva/sclera: Conjunctivae normal.     Pupils: Pupils are equal, round, and reactive to light.  Cardiovascular:     Rate and Rhythm: Normal rate and regular rhythm.     Pulses: Normal pulses.     Heart sounds: Normal heart sounds. No murmur heard. Pulmonary:     Effort: Pulmonary effort is normal. No respiratory distress.     Breath sounds: Normal breath sounds.  Abdominal:     Palpations: Abdomen is soft.     Tenderness: There is no abdominal tenderness.  Musculoskeletal:        General: No swelling.     Cervical back: Neck supple.  Skin:    General: Skin is warm and dry.     Capillary Refill: Capillary refill takes less than 2 seconds.  Neurological:     General: No focal deficit present.     Mental Status: She is alert.  Psychiatric:        Mood and Affect: Mood normal.     ED Results / Procedures / Treatments   Labs (all labs ordered are listed, but only abnormal results are displayed) Labs Reviewed  RESP PANEL BY RT-PCR (FLU A&B, COVID) [id number]    EKG None  Radiology No results found.  Procedures Procedures    Medications Ordered in ED Medications - No data to display  ED Course/ Medical Decision Making/ A&P                           Medical Decision Making  Stephanie [name] is here for COVID test after COVID exposure.  Asymptomatic.  Normal vitals.  No fever.  COVID and flu test negative.  Given reassurance.  Recommend calling primary care doctor to develop symptoms.  Return precautions given.  Discharged in good condition.  This chart was dictated using voice recognition software.  Despite best efforts to proofread,  errors can occur which can change the documentation meaning.         Final Clinical Impression(s) / ED  Diagnoses Final diagnoses:  Acute cough    Rx / DC Orders ED Discharge Orders     None         Lennice Sites, DO 12/20/21 1649

## 2021-12-20 NOTE — ED Triage Notes (Signed)
Patient presents POV reporting cough since Saturday. COVID exposure

## 2021-12-20 NOTE — ED Notes (Signed)
Patient left room prior to getting paperwork, given paperwork outside of ED, unable to obtain D/c vitals. Discharge instructions reviewed with patient. Patient verbalizes understanding, no further questions at this time. Medications/prescriptions and follow up information provided. No acute distress noted at time of departure.  

## 2021-12-22 ENCOUNTER — Other Ambulatory Visit: Payer: Self-pay | Admitting: Endocrinology

## 2021-12-22 ENCOUNTER — Encounter: Payer: Self-pay | Admitting: Family Medicine

## 2021-12-22 LAB — BASIC METABOLIC PANEL
BUN: 33 — AB (ref 4–21)
CO2: 24 — AB (ref 13–22)
Chloride: 103 (ref 99–108)
Creatinine: 2.2 — AB (ref 0.5–1.1)
Glucose: 67
Potassium: 4.9 mEq/L (ref 3.5–5.1)
Sodium: 141 (ref 137–147)

## 2021-12-22 LAB — COMPREHENSIVE METABOLIC PANEL
Albumin: 4.3 (ref 3.5–5.0)
Calcium: 9.6 (ref 8.7–10.7)
eGFR: 24

## 2021-12-22 LAB — MICROALBUMIN / CREATININE URINE RATIO: Microalb Creat Ratio: 1125

## 2021-12-22 LAB — CBC AND DIFFERENTIAL: WBC: -4

## 2021-12-22 LAB — PROTEIN / CREATININE RATIO, URINE
Albumin, U: 905.7
Creatinine, Urine: 80.5

## 2021-12-22 LAB — CBC: RBC: 0 — AB (ref 3.87–5.11)

## 2022-01-03 ENCOUNTER — Other Ambulatory Visit: Payer: Self-pay | Admitting: Endocrinology

## 2022-01-17 ENCOUNTER — Other Ambulatory Visit: Payer: Self-pay | Admitting: Endocrinology

## 2022-02-09 ENCOUNTER — Telehealth: Payer: Self-pay

## 2022-02-09 NOTE — Telephone Encounter (Signed)
Per patient Ozempic needs a PA

## 2022-02-24 ENCOUNTER — Other Ambulatory Visit (HOSPITAL_BASED_OUTPATIENT_CLINIC_OR_DEPARTMENT_OTHER): Payer: Self-pay

## 2022-02-25 NOTE — Telephone Encounter (Signed)
Patient has received medication. 

## 2022-03-03 ENCOUNTER — Telehealth: Payer: Self-pay | Admitting: Family Medicine

## 2022-03-03 NOTE — Telephone Encounter (Addendum)
Prescription Request  2.8.24  Is this a "Controlled Substance" medicine? No  LOV: 12/02/2021  What is the name of the medication or equipment? hydrocortisone valerate cream (WESTCORT) 0.2 % [[id number]]   Have you contacted your pharmacy to request a refill? Yes   Which pharmacy would you like this sent to?  [facility], [address] [place]. [place] [address] Phone: [phone] Fax: [phone]    Patient notified that their request is being sent to the clinical staff for review and that they should receive a response within 2 business days.   Please advise at Mobile [phone] (mobile)

## 2022-03-04 ENCOUNTER — Ambulatory Visit: Payer: 59 | Admitting: Family Medicine

## 2022-03-04 NOTE — Telephone Encounter (Signed)
Please advise message below per patient requested Rx was sent prescribed by previous doctor on 10/05/21. Last OV here 12/02/21

## 2022-03-07 MED ORDER — HYDROCORTISONE VALERATE 0.2 % EX CREA: 1.0000 | TOPICAL_CREAM | Freq: Two times a day (BID) | CUTANEOUS | 0 refills | Status: DC

## 2022-03-08 ENCOUNTER — Other Ambulatory Visit: Payer: Self-pay | Admitting: Endocrinology

## 2022-03-16 ENCOUNTER — Other Ambulatory Visit: Payer: Self-pay | Admitting: Endocrinology

## 2022-03-16 ENCOUNTER — Other Ambulatory Visit (INDEPENDENT_AMBULATORY_CARE_PROVIDER_SITE_OTHER): Payer: 59

## 2022-03-16 DIAGNOSIS — E1165 Type 2 diabetes mellitus with hyperglycemia: Secondary | ICD-10-CM | POA: Diagnosis not present

## 2022-03-16 DIAGNOSIS — Z794 Long term (current) use of insulin: Secondary | ICD-10-CM | POA: Diagnosis not present

## 2022-03-16 DIAGNOSIS — E782 Mixed hyperlipidemia: Secondary | ICD-10-CM

## 2022-03-16 LAB — LIPID PANEL
Cholesterol: 147 mg/dL (ref 0–200)
HDL: 39.2 mg/dL (ref >39.00)
LDL Cholesterol: 83 mg/dL (ref 0–99)
NonHDL: 107.73
Total CHOL/HDL Ratio: 4
Triglycerides: 124 mg/dL (ref 0.0–149.0)
VLDL: 24.8 mg/dL (ref 0.0–40.0)

## 2022-03-16 LAB — COMPREHENSIVE METABOLIC PANEL
ALT: 18 U/L (ref 0–35)
AST: 17 U/L (ref 0–37)
Albumin: 4.2 g/dL (ref 3.5–5.2)
Alkaline Phosphatase: 50 U/L (ref 39–117)
BUN: 43 mg/dL — ABNORMAL HIGH (ref 6–23)
CO2: 25 mEq/L (ref 19–32)
Calcium: 9.9 mg/dL (ref 8.4–10.5)
Chloride: 104 mEq/L (ref 96–112)
Creatinine, Ser: 2.33 mg/dL — ABNORMAL HIGH (ref 0.40–1.20)
GFR: 20.99 mL/min — ABNORMAL LOW (ref >60.00)
Glucose, Bld: 126 mg/dL — ABNORMAL HIGH (ref 70–99)
Potassium: 4.6 mEq/L (ref 3.5–5.1)
Sodium: 141 mEq/L (ref 135–145)
Total Bilirubin: 0.4 mg/dL (ref 0.2–1.2)
Total Protein: 8.3 g/dL (ref 6.0–8.3)

## 2022-03-16 LAB — HEMOGLOBIN A1C: Hgb A1c MFr Bld: 6.3 % (ref 4.6–6.5)

## 2022-03-17 LAB — MICROALBUMIN / CREATININE URINE RATIO
Creatinine,U: 114.7 mg/dL
Microalb Creat Ratio: 72.8 mg/g — ABNORMAL HIGH (ref 0.0–30.0)
Microalb, Ur: 83.5 mg/dL — ABNORMAL HIGH (ref 0.0–1.9)

## 2022-03-18 ENCOUNTER — Ambulatory Visit (INDEPENDENT_AMBULATORY_CARE_PROVIDER_SITE_OTHER): Payer: 59 | Admitting: Endocrinology

## 2022-03-18 ENCOUNTER — Encounter: Payer: Self-pay | Admitting: Endocrinology

## 2022-03-18 VITALS — BP 124/80 | HR 92 | Ht 63.0 in | Wt 234.4 lb

## 2022-03-18 DIAGNOSIS — E1165 Type 2 diabetes mellitus with hyperglycemia: Secondary | ICD-10-CM | POA: Diagnosis not present

## 2022-03-18 DIAGNOSIS — Z794 Long term (current) use of insulin: Secondary | ICD-10-CM | POA: Diagnosis not present

## 2022-03-18 DIAGNOSIS — E1121 Type 2 diabetes mellitus with diabetic nephropathy: Secondary | ICD-10-CM | POA: Diagnosis not present

## 2022-03-18 MED ORDER — TRESIBA FLEXTOUCH 100 UNIT/ML ~~LOC~~ SOPN: 14.0000 [IU] | PEN_INJECTOR | Freq: Every day | SUBCUTANEOUS | 1 refills | Status: DC

## 2022-03-18 NOTE — Progress Notes (Signed)
Patient ID: Stephanie [name], female   DOB: 20-May-1955, 67 y.o.   MRN: [id number]           Reason for Appointment: Followup   History of Present Illness:          Diagnosis: Type 2 diabetes mellitus, date of diagnosis: 2003       Past history:  She was initially treated metformin at some point glimepiride also added and not clear what her level of control was in the first few years. Had been only taking 500 mg twice a day of metformin and Amaryl increased to 4 mg twice a day several years ago In the last few years her blood sugar control has been more difficult and she has been managed by an endocrinologist Because of poor control she had been tried on Janumet and [place] but she states that she could not tolerate them because of headaches Had poor control with Amaryl and metformin and also difficulty with weight gain she had started Trulicity in [id number] on her initial consultation Her weight on her initial visit was 240 pounds. Her A1c had gone down to 7.1% in [id number] with using Trulicity which she was tolerating at that time However in 2/16 she stopped taking Trulicity as it was causing nausea , subsequently did not want to go on Tanzeum Because of markedly increased sugars she has been on premixed insulin since 4/16  Recent history:   INSULIN DOSE: Humalog mix 70/30, 8 before breakfast and 32 units before supper  Tresiba 14 units daily  Oral hypoglycemic drugs the patient is taking are:, Ozempic 0.5 mg weekly   A1c is now 6.3 compared to 6.1  Current diabetes history, management, problems and blood sugar patterns:   Glucose to cut back her morning Humalog dose to 6 units but is still taking 8, currently on Humalog mix Again asking about stopping insulin She is checking her blood sugars as always mostly overnight when she gets up to go to the bathroom but forgets to check it any other time and only rarely in the afternoon Blood sugars are generally low normal in the midday and  afternoon  Fasting lab glucose was 126 She is still not doing any walking or exercise Although her edema fluctuates her weight appears to be down 4 pounds Only rarely has slightly high readings around midnight possibly from evening meal or snacks Breakfast she is mostly eating egg, only occasionally getting carbohydrates like bread   Side effects from medications have been: Januvia, Onglyza apparently caused headaches, Invokana: Candidiasis   Compliance with the medical regimen: Fair    Dinner is usually at 7 pm  Glucose monitoring:  done 1 times a day or more       Glucometer:  Accu-Chek guide .      Blood Glucose readings by meter review with average readings for 30 days:   PRE-MEAL HS Lunch Dinner Bedtime Overall  Glucose range: 114-176      Mean/median:     127   POST-MEAL PC Breakfast PC Lunch PC Dinner  Glucose range:     Mean/median:        PRE-MEAL Fasting Lunch Dinner Bedtime Overall  Glucose range: 78, 101   108-161   Mean/median:     125   POST-MEAL PC Breakfast PC Lunch PC Dinner  Glucose range: 62, 104  ?  Mean/median:        Glycemic control:   Lab Results  Component Value Date   HGBA1C 6.3  03/16/2022   HGBA1C 6.1 11/10/2021   HGBA1C 5.8 07/06/2021   Lab Results  Component Value Date   MICROALBUR 83.5 (H) 03/16/2022   LDLCALC 83 03/16/2022   CREATININE 2.33 (H) 03/16/2022    Self-care: The diet that the patient has been following is: tries to limit fats usually.      Meals: 2-3 meals per day. Breakfast is cereal or scrambled eggs/sausage.   Lunch is half sandwich, yogurt and fruit; dinner usually baked chicken, bread and vegetables, snacks are fruits or nuts. Not eating out regularly  Dietician visit: Most recent: 06/2015 CDE visit: 12/15              Weight history: Previous range 200-260  Wt Readings from Last 3 Encounters:  03/18/22 234 lb 6.4 oz (106.3 kg)  12/20/21 240 lb (108.9 kg)  12/02/21 238 lb (108 kg)    Other active  problems: discussed in review of systems   LABS:  Lab on 03/16/2022  Component Date Value Ref Range Status   Microalb, Ur 03/16/2022 83.5 (H)  0.0 - 1.9 mg/dL Final   Creatinine,U 03/16/2022 114.7  mg/dL Final   Microalb Creat Ratio 03/16/2022 72.8 (H)  0.0 - 30.0 mg/g Final   Cholesterol 03/16/2022 147  0 - 200 mg/dL Final   ATP III Classification       Desirable:  < 200 mg/dL               Borderline High:  200 - 239 mg/dL          High:  > = 240 mg/dL   Triglycerides 03/16/2022 124.0  0.0 - 149.0 mg/dL Final   Normal:  <150 mg/dLBorderline High:  150 - 199 mg/dL   HDL 03/16/2022 39.20  >39.00 mg/dL Final   VLDL 03/16/2022 24.8  0.0 - 40.0 mg/dL Final   LDL Cholesterol 03/16/2022 83  0 - 99 mg/dL Final   Total CHOL/HDL Ratio 03/16/2022 4   Final                  Men          Women1/2 Average Risk     3.4          3.3Average Risk          5.0          4.42X Average Risk          9.6          7.13X Average Risk          15.0          11.0                       NonHDL 03/16/2022 107.73   Final   NOTE:  Non-HDL goal should be 30 mg/dL higher than patient's LDL goal (i.e. LDL goal of < 70 mg/dL, would have non-HDL goal of < 100 mg/dL)   Sodium 03/16/2022 141  135 - 145 mEq/L Final   Potassium 03/16/2022 4.6  3.5 - 5.1 mEq/L Final   Chloride 03/16/2022 104  96 - 112 mEq/L Final   CO2 03/16/2022 25  19 - 32 mEq/L Final   Glucose, Bld 03/16/2022 126 (H)  70 - 99 mg/dL Final   BUN 03/16/2022 43 (H)  6 - 23 mg/dL Final   Creatinine, Ser 03/16/2022 2.33 (H)  0.40 - 1.20 mg/dL Final   Total Bilirubin 03/16/2022 0.4  0.2 - 1.2 mg/dL Final  Alkaline Phosphatase 03/16/2022 50  39 - 117 U/L Final   AST 03/16/2022 17  0 - 37 U/L Final   ALT 03/16/2022 18  0 - 35 U/L Final   Total Protein 03/16/2022 8.3  6.0 - 8.3 g/dL Final   Albumin 03/16/2022 4.2  3.5 - 5.2 g/dL Final   GFR 03/16/2022 20.99 (L)  >60.00 mL/min Final   Calculated using the CKD-EPI Creatinine Equation (2021)   Calcium 03/16/2022  9.9  8.4 - 10.5 mg/dL Final   Hgb A1c MFr Bld 03/16/2022 6.3  4.6 - 6.5 % Final   Glycemic Control Guidelines for People with Diabetes:Non Diabetic:  <6%Goal of Therapy: <7%Additional Action Suggested:  >8%      Allergies as of 03/18/2022       Reactions   Avapro [irbesartan] Other (See Comments)   headaches   Codeine Nausea And Vomiting   Lisinopril Itching        Medication List        Accurate as of March 18, 2022  9:56 AM. If you have any questions, ask your nurse or doctor.          Accu-Chek FastClix Lancets Misc Use accu chek fastclix lancets to check blood sugar 2-3 times daily. DX:E11.65   Accu-Chek Guide Me w/Device Kit 1 each by Does not apply route 3 (three) times daily. Use accu chek guide me to check blood sugar 2-3 times daily. DX:E11.65   Accu-Chek Guide test strip Generic drug: glucose blood USE TEST STRIPS TO CHECK GLUCOSE 2-3 TIMES DAILY   acetaminophen 325 MG tablet Commonly known as: TYLENOL Take 2 tablets (650 mg total) by mouth every 6 (six) hours as needed for mild pain, fever or headache.   albuterol 108 (90 Base) MCG/ACT inhaler Commonly known as: VENTOLIN HFA Inhale 2 puffs into the lungs every 6 (six) hours as needed for wheezing or shortness of breath.   allopurinol 100 MG tablet Commonly known as: ZYLOPRIM Take 0.5 tablets (50 mg total) by mouth every other day.   amLODipine 5 MG tablet Commonly known as: NORVASC Take 1 tablet (5 mg total) by mouth daily.   ascorbic acid 500 MG tablet Commonly known as: VITAMIN C Take 1 tablet (500 mg total) by mouth 3 (three) times daily.   ferrous sulfate 325 (65 FE) MG tablet Take 1 tablet (325 mg total) by mouth 3 (three) times daily with meals. What changed: when to take this   furosemide 40 MG tablet Commonly known as: LASIX Take 1 tablet by mouth once daily   hydrocortisone valerate cream 0.2 % Commonly known as: WESTCORT Apply 1 application topically daily as needed (rash).    hydrocortisone valerate cream 0.2 % Commonly known as: WESTCORT Apply 1 Application topically 2 (two) times daily.   Insulin Degludec FlexTouch 100 UNIT/ML Sopn INJECT 24 UNITS SUBCUTANEOUSLY ONCE DAILY What changed: additional instructions   Insulin Lispro Prot & Lispro (75-25) 100 UNIT/ML Kwikpen Commonly known as: HumaLOG Mix 75/25 KwikPen INJECT 10 UNITS SUBCUTANEOUSLY BEFORE BREAKFAST AND 48 BEFORE SUPPER What changed: additional instructions   labetalol 100 MG tablet Commonly known as: NORMODYNE Take 1 tablet by mouth twice daily   Ozempic (0.25 or 0.5 MG/DOSE) 2 MG/3ML Sopn Generic drug: Semaglutide(0.25 or 0.'5MG'$ /DOS) INJECT 0.'5MG'$  INTO THE SKIN ONCE WEEKY   potassium chloride 10 MEQ tablet Commonly known as: KLOR-CON M Take 1 tablet by mouth once daily   ReliOn Pen Needles 31G X 6 MM Misc Generic drug: Insulin Pen Needle USE  1  THREE TIMES DAILY   BD Pen Needle Nano 2nd Gen 32G X 4 MM Misc Generic drug: Insulin Pen Needle USE 1  THREE TIMES DAILY   rosuvastatin 20 MG tablet Commonly known as: CRESTOR Take 1 tablet by mouth once daily   valsartan 160 MG tablet Commonly known as: DIOVAN Take 1 tablet by mouth once daily        Allergies:  Allergies  Allergen Reactions   Avapro [Irbesartan] Other (See Comments)    headaches   Codeine Nausea And Vomiting   Lisinopril Itching    Past Medical History:  Diagnosis Date   Abnormal Pap smear    Asthma    Diabetes mellitus    Hyperlipidemia    Hypertension    LGSIL (low grade squamous intraepithelial dysplasia)     Past Surgical History:  Procedure Laterality Date   ABDOMINAL HYSTERECTOMY     bladder surgery     BOWEL RESECTION N/A 10/23/2020   Procedure: SMALL BOWEL RESECTION;  Surgeon: Kinsinger, Arta [name], MD;  Location: [facility];  Service: General;  Laterality: N/A;   COLPOSCOPY     INCISIONAL HERNIA REPAIR N/A 10/23/2020   Procedure: OPEN HERNIA REPAIR INCISIONAL WITH MESH;  Surgeon: Kieth Brightly,  Arta [name], MD;  Location: [facility];  Service: General;  Laterality: N/A;   INSERTION OF MESH N/A 10/23/2020   Procedure: INSERTION OF MESH;  Surgeon: Kieth Brightly Arta [name], MD;  Location: [facility];  Service: General;  Laterality: N/A;   LAPAROTOMY N/A 10/23/2020   Procedure: EXPLORATORY LAPAROTOMY;  Surgeon: Mickeal Skinner, MD;  Location: [facility];  Service: General;  Laterality: N/A;    Family History  Problem Relation Age of Onset   Diabetes Mother    Diabetes Maternal Grandmother    Heart disease Neg Hx    Hypertension Neg Hx     Social History:  reports that she has never smoked. She has never used smokeless tobacco. She reports that she does not drink alcohol and does not use drugs.    Review of Systems    Hypertension:   She is taking valsartan 160 mg, and also labetalol 100 mg She thinks her blood pressure is higher in the morning and lower in the afternoons  She is followed by nephrologist regularly  BP Readings from Last 3 Encounters:  03/18/22 124/80  12/20/21 (!) 180/92  12/02/21 (!) 138/95   Renal function has been variable as below She has been seen by  nephrologist regularly  On Lasix 40 for edema   Lab Results  Component Value Date   CREATININE 2.33 (H) 03/16/2022   CREATININE 2.2 (A) 12/22/2021   CREATININE 1.73 (H) 11/10/2021   Lab Results  Component Value Date   K 4.6 03/16/2022        Hypercholesterolemia:    LDL has been previously high, around 180 baseline She has been regular with her Crestor 20 mg dose and her LDL is at target        Lab Results  Component Value Date   CHOL 147 03/16/2022   CHOL 135 07/06/2021   CHOL 131 12/22/2020   Lab Results  Component Value Date   HDL 39.20 03/16/2022   HDL 41.20 07/06/2021   HDL 36.10 (L) 12/22/2020   Lab Results  Component Value Date   LDLCALC 83 03/16/2022   LDLCALC 78 07/06/2021   LDLCALC 59 12/22/2020   Lab Results  Component Value Date   TRIG 124.0 03/16/2022   TRIG 79.0  07/06/2021   TRIG 179.0 (H) 12/22/2020   Lab Results  Component Value Date   CHOLHDL 4 03/16/2022   CHOLHDL 3 07/06/2021   CHOLHDL 4 12/22/2020   Lab Results  Component Value Date   LDLDIRECT 89.0 12/05/2016   LDLDIRECT 183.0 10/01/2015                Diabetic foot exam last done in 123XX123   Complications from diabetes: Proliferative retinopathy, microalbuminuria    Physical Examination:  BP 124/80 (BP Location: Left Arm, Patient Position: Sitting, Cuff Size: Normal)   Pulse 92   Ht '5\' 3"'$  (1.6 m)   Wt 234 lb 6.4 oz (106.3 kg)   SpO2 96%   BMI 41.52 kg/m   no pedal edema present   Diabetic Foot Exam - Simple   Simple Foot Form Diabetic Foot exam was performed with the following findings: Yes   Visual Inspection No deformities, no ulcerations, no other skin breakdown bilaterally: Yes Sensation Testing Intact to touch and monofilament testing bilaterally: Yes Pulse Check Posterior Tibialis and Dorsalis pulse intact bilaterally: Yes Comments      ASSESSMENT:  Diabetes type 2, with obesity, insulin requiring  See history of present illness for detailed discussion of his current management, blood sugar patterns and problems identified  Her A1c is 6.3  She is on premixed insulin twice a day, Tresiba 14 units, Ozempic 0.5 mg weekly  Glucose monitoring is inadequate and she is only using her Accu-Chek during the night and not clear why I am not can during the daytime and after meals as directed Blood sugars look fairly good although may be relatively low in the afternoons from her morning Humalog mix insulin   Plan:   She was instructed To check her blood sugars only on waking up and 2 hours after meals instead of current regimen  She was again recommended the CGM but she refuses She will stop her morning Humalog mix insulin for now, can take it only when she is eating bread She will try to start exercise and she thinks she can join the [employer] sneakers  program Make sure she is taking her mixed insulin 30-minute before dinner   Hypertension: Blood pressure is not as high although she again reports readings around 150 in the morning For now she will try to take her labetalol as soon as she wakes up rather than later in the morning with breakfast She will also be discussing the management with her nephrologist  RENAL dysfunction: This is relatively worse now, labs forwarded to nephrologist Microalbumin relatively better  LIPIDS: Not on Crestor and she will need to continue this long-term   Follow-up in 3 months   There are no Patient Instructions on file for this visit.     Elayne Snare 03/18/2022, 9:56 AM   Note: This office note was prepared with Dragon voice recognition system technology. Any transcriptional errors that result from this process are unintentional.

## 2022-03-18 NOTE — Patient Instructions (Addendum)
Take am Labetalol on waking up  Stop am Novolog Mix dose  Check blood sugars on waking up 3 days a week  Also check blood sugars about 2 hours after meals and do this after different meals by rotation  Recommended blood sugar levels on waking up are 90-130 and about 2 hours after meal is 130-160  Please bring your blood sugar monitor to each visit, thank you  Start exercise daily

## 2022-03-24 ENCOUNTER — Ambulatory Visit (INDEPENDENT_AMBULATORY_CARE_PROVIDER_SITE_OTHER): Payer: 59 | Admitting: Family Medicine

## 2022-03-24 ENCOUNTER — Encounter: Payer: Self-pay | Admitting: Family Medicine

## 2022-03-24 VITALS — BP 152/82 | HR 81 | Temp 96.8°F | Ht 63.0 in | Wt 235.6 lb

## 2022-03-24 DIAGNOSIS — Z8739 Personal history of other diseases of the musculoskeletal system and connective tissue: Secondary | ICD-10-CM

## 2022-03-24 DIAGNOSIS — I10 Essential (primary) hypertension: Secondary | ICD-10-CM

## 2022-03-24 DIAGNOSIS — N184 Chronic kidney disease, stage 4 (severe): Secondary | ICD-10-CM

## 2022-03-24 LAB — URIC ACID: Uric Acid, Serum: 7.9 mg/dL — ABNORMAL HIGH (ref 2.4–7.0)

## 2022-03-24 NOTE — Progress Notes (Signed)
Established Patient Office Visit   Subjective:  Patient ID: Stephanie [name], female    DOB: [birth year]  Age: 67 y.o. MRN: [id number]  Chief Complaint  Patient presents with  . Medical Management of Chronic Issues    3 month follow up, no concerns. Patient not fasting.     HPI Encounter Diagnoses  Name Primary?  . Essential hypertension, benign Yes  . Stage 4 chronic kidney disease ([place])   . History of gout    For follow-up of above.  Blood pressures have been running in the 1 30-1 40s over 60s to 70s at home.  Continues follow-up with endocrinology for diabetes.  He is now being followed by nephrology.  Rosuvastatin allopurinol been adjusted for renal dosing.  She feels well.   Review of Systems  Constitutional: Negative.   HENT: Negative.    Eyes:  Negative for blurred vision, discharge and redness.  Respiratory: Negative.    Cardiovascular: Negative.   Gastrointestinal:  Negative for abdominal pain.  Genitourinary: Negative.   Musculoskeletal: Negative.  Negative for myalgias.  Skin:  Negative for rash.  Neurological:  Negative for tingling, loss of consciousness and weakness.  Endo/Heme/Allergies:  Negative for polydipsia.     Current Outpatient Medications:  .  Accu-Chek FastClix Lancets MISC, Use accu chek fastclix lancets to check blood sugar 2-3 times daily. DX:E11.65, Disp: 300 each, Rfl: 1 .  ACCU-CHEK GUIDE test strip, USE TEST STRIPS TO CHECK GLUCOSE 2-3 TIMES DAILY, Disp: 300 each, Rfl: 0 .  acetaminophen (TYLENOL) 325 MG tablet, Take 2 tablets (650 mg total) by mouth every 6 (six) hours as needed for mild pain, fever or headache., Disp: , Rfl:  .  albuterol (VENTOLIN HFA) 108 (90 Base) MCG/ACT inhaler, Inhale 2 puffs into the lungs every 6 (six) hours as needed for wheezing or shortness of breath., Disp: 8 g, Rfl: 0 .  allopurinol (ZYLOPRIM) 100 MG tablet, Take 0.5 tablets (50 mg total) by mouth every other day., Disp: 45 tablet, Rfl: 2 .  ascorbic acid  (VITAMIN C) 500 MG tablet, Take 1 tablet (500 mg total) by mouth 3 (three) times daily., Disp: 90 tablet, Rfl: 0 .  Blood Glucose Monitoring Suppl (ACCU-CHEK GUIDE ME) w/Device KIT, 1 each by Does not apply route 3 (three) times daily. Use accu chek guide me to check blood sugar 2-3 times daily. DX:E11.65, Disp: 1 kit, Rfl: 0 .  ferrous sulfate 325 (65 FE) MG tablet, Take 1 tablet (325 mg total) by mouth 3 (three) times daily with meals. (Patient taking differently: Take 325 mg by mouth daily.), Disp: 90 tablet, Rfl: 0 .  furosemide (LASIX) 40 MG tablet, Take 1 tablet by mouth once daily, Disp: 90 tablet, Rfl: 0 .  hydrocortisone valerate cream (WESTCORT) 0.2 %, Apply 1 application topically daily as needed (rash)., Disp: , Rfl:  .  hydrocortisone valerate cream (WESTCORT) 0.2 %, Apply 1 Application topically 2 (two) times daily., Disp: 45 g, Rfl: 0 .  Insulin Lispro Prot & Lispro (HUMALOG MIX 75/25 KWIKPEN) (75-25) 100 UNIT/ML Kwikpen, INJECT 10 UNITS SUBCUTANEOUSLY BEFORE BREAKFAST AND 48 BEFORE SUPPER (Patient taking differently: Inject 8 in the morning, 34 with dinner and), Disp: 60 mL, Rfl: 0 .  Insulin Pen Needle (BD PEN NEEDLE NANO 2ND GEN) 32G X 4 MM MISC, USE 1  THREE TIMES DAILY, Disp: 100 each, Rfl: 5 .  Insulin Pen Needle (RELION PEN NEEDLES) 31G X 6 MM MISC, USE 1  THREE TIMES DAILY, Disp: 200  each, Rfl: 0 .  labetalol (NORMODYNE) 100 MG tablet, Take 1 tablet by mouth twice daily, Disp: 180 tablet, Rfl: 1 .  potassium chloride (KLOR-CON M) 10 MEQ tablet, Take 1 tablet by mouth once daily, Disp: 90 tablet, Rfl: 0 .  rosuvastatin (CRESTOR) 20 MG tablet, Take 1 tablet by mouth once daily, Disp: 90 tablet, Rfl: 0 .  Semaglutide,0.25 or 0.'5MG'$ /DOS, (OZEMPIC, 0.25 OR 0.5 MG/DOSE,) 2 MG/3ML SOPN, INJECT 0.'5MG'$  INTO THE SKIN ONCE WEEKY, Disp: 3 mL, Rfl: 11 .  TRESIBA FLEXTOUCH 100 UNIT/ML FlexTouch Pen, Inject 14 Units into the skin daily., Disp: 15 mL, Rfl: 1 .  valsartan (DIOVAN) 160 MG tablet,  Take 1 tablet by mouth once daily, Disp: 90 tablet, Rfl: 3 .  amLODipine (NORVASC) 5 MG tablet, Take 1 tablet (5 mg total) by mouth daily. (Patient not taking: Reported on 03/24/2022), Disp: 90 tablet, Rfl: 1   Objective:     BP (!) 152/82 (BP Location: Left Arm, Patient Position: Sitting, Cuff Size: Large)   Pulse 81   Temp (!) 96.8 F (36 C) (Temporal)   Ht '5\' 3"'$  (1.6 m)   Wt 235 lb 9.6 oz (106.9 kg)   SpO2 98%   BMI 41.73 kg/m    Physical Exam Constitutional:      General: She is not in acute distress.    Appearance: Normal appearance. She is not ill-appearing, toxic-appearing or diaphoretic.  HENT:     Head: Normocephalic and atraumatic.     Right Ear: External ear normal.     Left Ear: External ear normal.  Eyes:     General: No scleral icterus.       Right eye: No discharge.        Left eye: No discharge.     Extraocular Movements: Extraocular movements intact.     Conjunctiva/sclera: Conjunctivae normal.     Pupils: Pupils are equal, round, and reactive to light.  Cardiovascular:     Rate and Rhythm: Normal rate and regular rhythm.  Pulmonary:     Effort: Pulmonary effort is normal. No respiratory distress.     Breath sounds: Normal breath sounds.  Skin:    General: Skin is warm and dry.  Neurological:     Mental Status: She is alert and oriented to person, place, and time.  Psychiatric:        Mood and Affect: Mood normal.        Behavior: Behavior normal.     No results found for any visits on 03/24/22.    The 10-year ASCVD risk score (Arnett DK, et al., 2019) is: 25.9%    Assessment & Plan:   Essential hypertension, benign  Stage 4 chronic kidney disease ([place])  History of gout -     Uric acid    Return in about 6 months (around 09/22/2022), or continue weight loss efforts.Libby Maw, MD

## 2022-04-06 DIAGNOSIS — I129 Hypertensive chronic kidney disease with stage 1 through stage 4 chronic kidney disease, or unspecified chronic kidney disease: Secondary | ICD-10-CM | POA: Diagnosis not present

## 2022-04-06 DIAGNOSIS — N1832 Chronic kidney disease, stage 3b: Secondary | ICD-10-CM | POA: Diagnosis not present

## 2022-04-06 DIAGNOSIS — D631 Anemia in chronic kidney disease: Secondary | ICD-10-CM | POA: Diagnosis not present

## 2022-04-06 DIAGNOSIS — E1122 Type 2 diabetes mellitus with diabetic chronic kidney disease: Secondary | ICD-10-CM | POA: Diagnosis not present

## 2022-04-06 DIAGNOSIS — E785 Hyperlipidemia, unspecified: Secondary | ICD-10-CM | POA: Diagnosis not present

## 2022-04-06 DIAGNOSIS — N2581 Secondary hyperparathyroidism of renal origin: Secondary | ICD-10-CM | POA: Diagnosis not present

## 2022-04-13 ENCOUNTER — Other Ambulatory Visit: Payer: Self-pay | Admitting: Endocrinology

## 2022-06-14 ENCOUNTER — Other Ambulatory Visit: Payer: Self-pay | Admitting: Endocrinology

## 2022-06-18 ENCOUNTER — Other Ambulatory Visit: Payer: Self-pay | Admitting: Endocrinology

## 2022-06-19 ENCOUNTER — Other Ambulatory Visit: Payer: Self-pay

## 2022-06-27 ENCOUNTER — Other Ambulatory Visit: Payer: Self-pay | Admitting: Endocrinology

## 2022-06-27 ENCOUNTER — Other Ambulatory Visit: Payer: Self-pay | Admitting: Family Medicine

## 2022-06-27 DIAGNOSIS — Z8739 Personal history of other diseases of the musculoskeletal system and connective tissue: Secondary | ICD-10-CM

## 2022-06-28 ENCOUNTER — Telehealth: Payer: Self-pay

## 2022-06-28 NOTE — Telephone Encounter (Signed)
Request for Furosemide. Would you like to refill?

## 2022-07-01 ENCOUNTER — Ambulatory Visit (INDEPENDENT_AMBULATORY_CARE_PROVIDER_SITE_OTHER): Payer: 59

## 2022-07-01 ENCOUNTER — Other Ambulatory Visit: Payer: Self-pay | Admitting: Endocrinology

## 2022-07-01 VITALS — Ht 64.0 in | Wt 225.0 lb

## 2022-07-01 DIAGNOSIS — Z0000 Encounter for general adult medical examination without abnormal findings: Secondary | ICD-10-CM | POA: Diagnosis not present

## 2022-07-01 DIAGNOSIS — E2839 Other primary ovarian failure: Secondary | ICD-10-CM

## 2022-07-01 NOTE — Progress Notes (Signed)
I connected with  Creig Hines on 07/01/22 by a audio enabled telemedicine application and verified that I am speaking with the correct person using two identifiers.  Patient Location: Home  Provider Location: Office/Clinic  I discussed the limitations of evaluation and management by telemedicine. The patient expressed understanding and agreed to proceed. Subjective:   Stephanie [name] is a 67 y.o. female who presents for Medicare Annual (Subsequent) preventive examination.  Review of Systems     Cardiac Risk Factors include: advanced age (>44men, >73 women);diabetes mellitus;dyslipidemia;hypertension;obesity (BMI >30kg/m2)     Objective:    Today's Vitals   07/01/22 1256  Weight: 225 lb (102.1 kg)  Height: 5\' 4"  (1.626 m)   Body mass index is 38.62 kg/m.     07/01/2022    1:05 PM 12/20/2021    2:22 PM 06/29/2021    1:44 PM 09/28/2020   11:04 PM 05/30/2018    4:12 AM 06/26/2015    1:38 PM 05/06/2014    6:31 PM  Advanced Directives  Does Patient Have a Medical Advance Directive? No No No No No No No  Would patient like information on creating a medical advance directive?   No - Patient declined   Yes - Educational materials given     Current Medications (verified) Outpatient Encounter Medications as of 07/01/2022  Medication Sig   Accu-Chek FastClix Lancets MISC Use accu chek fastclix lancets to check blood sugar 2-3 times daily. DX:E11.65   acetaminophen (TYLENOL) 325 MG tablet Take 2 tablets (650 mg total) by mouth every 6 (six) hours as needed for mild pain, fever or headache.   albuterol (VENTOLIN HFA) 108 (90 Base) MCG/ACT inhaler Inhale 2 puffs into the lungs every 6 (six) hours as needed for wheezing or shortness of breath.   allopurinol (ZYLOPRIM) 100 MG tablet TAKE 1/2 (ONE-HALF) TABLET BY MOUTH EVERY OTHER DAY   ascorbic acid (VITAMIN C) 500 MG tablet Take 1 tablet (500 mg total) by mouth 3 (three) times daily.   Blood Glucose Monitoring Suppl (ACCU-CHEK GUIDE ME)  w/Device KIT 1 each by Does not apply route 3 (three) times daily. Use accu chek guide me to check blood sugar 2-3 times daily. DX:E11.65   ferrous sulfate 325 (65 FE) MG tablet Take 1 tablet (325 mg total) by mouth 3 (three) times daily with meals. (Patient taking differently: Take 325 mg by mouth daily.)   furosemide (LASIX) 40 MG tablet Take 1 tablet by mouth once daily   glucose blood (ACCU-CHEK GUIDE) test strip USE TO CHECK BLOOD GLUCOSE THREE TO FOUR TIMES DAILY   hydrocortisone valerate cream (WESTCORT) 0.2 % Apply 1 application topically daily as needed (rash).   hydrocortisone valerate cream (WESTCORT) 0.2 % Apply 1 Application topically 2 (two) times daily.   Insulin Lispro Prot & Lispro (HUMALOG MIX 75/25 KWIKPEN) (75-25) 100 UNIT/ML Kwikpen INJECT 10 UNITS SUBCUTANEOUSLY BEFORE BREAKFAST AND 48 BEFORE SUPPER (Patient taking differently: 34 with dinner)   Insulin Pen Needle (BD PEN NEEDLE NANO 2ND GEN) 32G X 4 MM MISC USE 1  THREE TIMES DAILY   Insulin Pen Needle (RELION PEN NEEDLES) 31G X 6 MM MISC USE 1  THREE TIMES DAILY   labetalol (NORMODYNE) 100 MG tablet Take 1 tablet by mouth twice daily   potassium chloride (KLOR-CON M) 10 MEQ tablet Take 1 tablet by mouth once daily   rosuvastatin (CRESTOR) 20 MG tablet Take 1 tablet by mouth once daily   Semaglutide,0.25 or 0.5MG /DOS, (OZEMPIC, 0.25 OR 0.5 MG/DOSE,)  2 MG/3ML SOPN INJECT 0.5MG  INTO THE SKIN ONCE WEEKY   TRESIBA FLEXTOUCH 100 UNIT/ML FlexTouch Pen Inject 14 Units into the skin daily.   valsartan (DIOVAN) 160 MG tablet Take 1 tablet by mouth once daily   amLODipine (NORVASC) 5 MG tablet Take 1 tablet (5 mg total) by mouth daily. (Patient not taking: Reported on 03/24/2022)   No facility-administered encounter medications on file as of 07/01/2022.    Allergies (verified) Avapro [irbesartan], Codeine, and Lisinopril   History: Past Medical History:  Diagnosis Date   Abnormal Pap smear    Asthma    Diabetes mellitus     Hyperlipidemia    Hypertension    LGSIL (low grade squamous intraepithelial dysplasia)    Past Surgical History:  Procedure Laterality Date   ABDOMINAL HYSTERECTOMY     bladder surgery     BOWEL RESECTION N/A 10/23/2020   Procedure: SMALL BOWEL RESECTION;  Surgeon: Kinsinger, De Blanch, MD;  Location: [facility] OR;  Service: General;  Laterality: N/A;   COLPOSCOPY     INCISIONAL HERNIA REPAIR N/A 10/23/2020   Procedure: OPEN HERNIA REPAIR INCISIONAL WITH MESH;  Surgeon: Sheliah Hatch, De Blanch, MD;  Location: [facility] OR;  Service: General;  Laterality: N/A;   INSERTION OF MESH N/A 10/23/2020   Procedure: INSERTION OF MESH;  Surgeon: Kinsinger, De Blanch, MD;  Location: [facility] OR;  Service: General;  Laterality: N/A;   LAPAROTOMY N/A 10/23/2020   Procedure: EXPLORATORY LAPAROTOMY;  Surgeon: Rodman Pickle, MD;  Location: [facility] OR;  Service: General;  Laterality: N/A;   Family History  Problem Relation Age of Onset   Diabetes Mother    Diabetes Maternal Grandmother    Heart disease Neg Hx    Hypertension Neg Hx    Social History   Socioeconomic History   Marital status: Single    Spouse name: Not on file   Number of children: Not on file   Years of education: Not on file   Highest education level: Not on file  Occupational History   Not on file  Tobacco Use   Smoking status: Never   Smokeless tobacco: Never  Vaping Use   Vaping Use: Never used  Substance and Sexual Activity   Alcohol use: No   Drug use: No   Sexual activity: Yes    Birth control/protection: Surgical    Comment: hyst  Other Topics Concern   Not on file  Social History Narrative   Not on file   Social Determinants of Health   Financial Resource Strain: Low Risk  (07/01/2022)   Overall Financial Resource Strain (CARDIA)    Difficulty of Paying Living Expenses: Not hard at all  Food Insecurity: No Food Insecurity (07/01/2022)   Hunger Vital Sign    Worried About Running Out of Food in the Last Year: Never true    Ran  Out of Food in the Last Year: Never true  Transportation Needs: No Transportation Needs (07/01/2022)   PRAPARE - Administrator, Civil Service (Medical): No    Lack of Transportation (Non-Medical): No  Physical Activity: Inactive (07/01/2022)   Exercise Vital Sign    Days of Exercise per Week: 0 days    Minutes of Exercise per Session: 0 min  Stress: No Stress Concern Present (07/01/2022)   [employer] of Occupational Health - Occupational Stress Questionnaire    Feeling of Stress : Not at all  Social Connections: Moderately Isolated (06/29/2021)   Social Connection and Isolation Panel [NHANES]  Frequency of Communication with Friends and Family: Three times a week    Frequency of Social Gatherings with Friends and Family: Three times a week    Attends Religious Services: More than 4 times per year    Active Member of Clubs or Organizations: No    Attends Banker Meetings: Never    Marital Status: Never married    Tobacco Counseling Counseling given: Not Answered   Clinical Intake:  Pre-visit preparation completed: Yes  Pain : No/denies pain     Nutritional Status: BMI > 30  Obese Nutritional Risks: None Diabetes: Yes  How often do you need to have someone help you when you read instructions, pamphlets, or other written materials from your doctor or pharmacy?: 1 - Never  Diabetic? Yes Nutrition Risk Assessment:  Has the patient had any N/V/D within the last 2 months?  No  Does the patient have any non-healing wounds?  No  Has the patient had any unintentional weight loss or weight gain?  No   Diabetes:  Is the patient diabetic?  Yes  If diabetic, was a CBG obtained today?  No  Did the patient bring in their glucometer from home?  No  How often do you monitor your CBG's? daily.   Financial Strains and Diabetes Management:  Are you having any financial strains with the device, your supplies or your medication? No .  Does the patient want  to be seen by Chronic Care Management for management of their diabetes?  No  Would the patient like to be referred to a Nutritionist or for Diabetic Management?  No   Diabetic Exams:  Diabetic Eye Exam: Overdue for diabetic eye exam. Pt has been advised about the importance in completing this exam. Patient advised to call and schedule an eye exam. Diabetic Foot Exam: Completed 03/18/2022  Interpreter Needed?: No  Information entered by :: NAllen LPN   Activities of Daily Living    07/01/2022    1:06 PM  In your present state of health, do you have any difficulty performing the following activities:  Hearing? 0  Vision? 1  Comment blurriness at times that comes and goes  Difficulty concentrating or making decisions? 0  Walking or climbing stairs? 0  Dressing or bathing? 0  Doing errands, shopping? 0  Preparing Food and eating ? N  Using the Toilet? N  In the past six months, have you accidently leaked urine? Y  Comment with sneeze  Do you have problems with loss of bowel control? N  Managing your Medications? N  Managing your Finances? N  Housekeeping or managing your Housekeeping? N    Patient Care Team: Mliss Sax, MD as PCP - General (Family Medicine)  Indicate any recent Medical Services you may have received from other than [facility] providers in the past year (date may be approximate).     Assessment:   This is a routine wellness examination for [place].  Hearing/Vision screen Vision Screening - Comments:: No regular eye exams, Dr. Madilyn Fireman  Dietary issues and exercise activities discussed: Current Exercise Habits: The patient does not participate in regular exercise at present   Goals Addressed             This Visit's Progress    Patient Stated       07/01/2022, wants to lose weight       Depression Screen    07/01/2022    1:06 PM 03/24/2022   10:38 AM 09/01/2021   11:16 AM  07/01/2021   10:22 AM 06/29/2021    1:46 PM 06/29/2021    1:43 PM 05/10/2021     1:57 PM  PHQ 2/9 Scores  PHQ - 2 Score 0 0 0 0 0 0 0  PHQ- 9 Score       0    Fall Risk    07/01/2022    1:06 PM 03/24/2022   10:39 AM 12/02/2021    1:02 PM 09/01/2021   11:16 AM 07/01/2021   10:22 AM  Fall Risk   Falls in the past year? 0 0 0 0 0  Number falls in past yr: 0 0 0 0 0  Injury with Fall? 0 0 0    Risk for fall due to : Medication side effect No Fall Risks     Follow up Falls prevention discussed;Education provided;Falls evaluation completed Falls evaluation completed       FALL RISK PREVENTION PERTAINING TO THE HOME:  Any stairs in or around the home? Yes  If so, are there any without handrails? No  Home free of loose throw rugs in walkways, pet beds, electrical cords, etc? Yes  Adequate lighting in your home to reduce risk of falls? Yes   ASSISTIVE DEVICES UTILIZED TO PREVENT FALLS:  Life alert? No  Use of a cane, walker or w/c? No  Grab bars in the bathroom? No  Shower chair or bench in shower? No  Elevated toilet seat or a handicapped toilet? No   TIMED UP AND GO:  Was the test performed? No .      Cognitive Function:        07/01/2022    1:08 PM  6CIT Screen  What Year? 0 points  What month? 0 points  What time? 0 points  Count back from 20 0 points  Months in reverse 0 points  Repeat phrase 2 points  Total Score 2 points    Immunizations Immunization History  Administered Date(s) Administered   Fluad Quad(high Dose 65+) 10/27/2020, 12/02/2021   Influenza Split 10/10/2016   Influenza,inj,Quad PF,6+ Mos 11/24/2010, 10/28/2013, 12/03/2014, 12/11/2015   Influenza-Unspecified 11/20/2012   PFIZER Comirnaty(Gray Top)Covid-19 Tri-Sucrose Vaccine 09/16/2020   PFIZER(Purple Top)SARS-COV-2 Vaccination 03/24/2019, 04/23/2019   PNEUMOCOCCAL CONJUGATE-20 08/30/2020   Pneumococcal Polysaccharide-23 05/11/2000, 12/27/2010, 10/10/2016   Td 05/17/2002   Tdap 02/08/2012, 10/10/2016   Unspecified SARS-COV-2 Vaccination 03/22/2019, 04/19/2019   Zoster,  Live 02/07/2014, 10/10/2016, 05/19/2020    TDAP status: Up to date  Flu Vaccine status: Up to date  Pneumococcal vaccine status: Up to date  Covid-19 vaccine status: Completed vaccines  Qualifies for Shingles Vaccine? Yes   Zostavax completed Yes   Shingrix Completed?: Yes  Screening Tests Health Maintenance  Topic Date Due   OPHTHALMOLOGY EXAM  Never done   Zoster Vaccines- Shingrix (1 of 2) Never done   DEXA SCAN  Never done   Colonoscopy  06/21/2021   Medicare Annual Wellness (AWV)  06/30/2022   INFLUENZA VACCINE  08/25/2022   HEMOGLOBIN A1C  09/14/2022   Diabetic kidney evaluation - eGFR measurement  03/17/2023   Diabetic kidney evaluation - Urine ACR  03/17/2023   FOOT EXAM  03/19/2023   MAMMOGRAM  07/16/2023   DTaP/Tdap/Td (4 - Td or Tdap) 10/11/2026   Pneumonia Vaccine 63+ Years old  Completed   HPV VACCINES  Aged Out   COVID-19 Vaccine  Discontinued   Hepatitis C Screening  Discontinued    Health Maintenance  Health Maintenance Due  Topic Date Due   OPHTHALMOLOGY  EXAM  Never done   Zoster Vaccines- Shingrix (1 of 2) Never done   DEXA SCAN  Never done   Colonoscopy  06/21/2021   Medicare Annual Wellness (AWV)  06/30/2022    Colorectal cancer screening: Type of screening: Colonoscopy. Completed 06/22/2011. Repeat every 10 years  Mammogram status: Completed 07/15/2021. Repeat every year  Bone Density status: Ordered today. Pt provided with contact info and advised to call to schedule appt.  Lung Cancer Screening: (Low Dose CT Chest recommended if Age 48-80 years, 30 pack-year currently smoking OR have quit w/in 15years.) does not qualify.   Lung Cancer Screening Referral: no  Additional Screening:  Hepatitis C Screening: does not qualify;   Vision Screening: Recommended annual ophthalmology exams for early detection of glaucoma and other disorders of the eye. Is the patient up to date with their annual eye exam?  No  Who is the provider or what is the  name of the office in which the patient attends annual eye exams? Dr. Madilyn Fireman If pt is not established with a provider, would they like to be referred to a provider to establish care? No .   Dental Screening: Recommended annual dental exams for proper oral hygiene  Community Resource Referral / Chronic Care Management: CRR required this visit?  No   CCM required this visit?  No      Plan:     I have personally reviewed and noted the following in the patient's chart:   Medical and social history Use of alcohol, tobacco or illicit drugs  Current medications and supplements including opioid prescriptions. Patient is not currently taking opioid prescriptions. Functional ability and status Nutritional status Physical activity Advanced directives List of other physicians Hospitalizations, surgeries, and ER visits in previous 12 months Vitals Screenings to include cognitive, depression, and falls Referrals and appointments  In addition, I have reviewed and discussed with patient certain preventive protocols, quality metrics, and best practice recommendations. A written personalized care plan for preventive services as well as general preventive health recommendations were provided to patient.     Barb Merino, LPN   03/05/5619   Nurse Notes: none  Due to this being a virtual visit, the after visit summary with patients personalized plan was offered to patient via mail or my-chart.  to pick up at office at next visit

## 2022-07-01 NOTE — Patient Instructions (Signed)
Stephanie [name] , Thank you for taking time to come for your Medicare Wellness Visit. I appreciate your ongoing commitment to your health goals. Please review the following plan we discussed and let me know if I can assist you in the future.   These are the goals we discussed:  Goals      Patient Stated     07/01/2022, wants to lose weight        This is a list of the screening recommended for you and due dates:  Health Maintenance  Topic Date Due   Eye exam for diabetics  Never done   DEXA scan (bone density measurement)  Never done   Colon Cancer Screening  06/21/2021   Flu Shot  08/25/2022   Hemoglobin A1C  09/14/2022   Yearly kidney function blood test for diabetes  03/17/2023   Yearly kidney health urinalysis for diabetes  03/17/2023   Complete foot exam   03/19/2023   Medicare Annual Wellness Visit  07/01/2023   Mammogram  07/16/2023   DTaP/Tdap/Td vaccine (4 - Td or Tdap) 10/11/2026   Pneumonia Vaccine  Completed   Zoster (Shingles) Vaccine  Completed   HPV Vaccine  Aged Out   COVID-19 Vaccine  Discontinued   Hepatitis C Screening  Discontinued    Advanced directives: Advance directive discussed with you today.   Conditions/risks identified: none  Next appointment: Follow up in one year for your annual wellness visit    Preventive Care 65 Years and Older, Female Preventive care refers to lifestyle choices and visits with your health care provider that can promote health and wellness. What does preventive care include? A yearly physical exam. This is also called an annual well check. Dental exams once or twice a year. Routine eye exams. Ask your health care provider how often you should have your eyes checked. Personal lifestyle choices, including: Daily care of your teeth and gums. Regular physical activity. Eating a healthy diet. Avoiding tobacco and drug use. Limiting alcohol use. Practicing safe sex. Taking low-dose aspirin every day. Taking vitamin and mineral  supplements as recommended by your health care provider. What happens during an annual well check? The services and screenings done by your health care provider during your annual well check will depend on your age, overall health, lifestyle risk factors, and family history of disease. Counseling  Your health care provider may ask you questions about your: Alcohol use. Tobacco use. Drug use. Emotional well-being. Home and relationship well-being. Sexual activity. Eating habits. History of falls. Memory and ability to understand (cognition). Work and work Astronomer. Reproductive health. Screening  You may have the following tests or measurements: Height, weight, and BMI. Blood pressure. Lipid and cholesterol levels. These may be checked every 5 years, or more frequently if you are over 28 years old. Skin check. Lung cancer screening. You may have this screening every year starting at age 46 if you have a 30-pack-year history of smoking and currently smoke or have quit within the past 15 years. Fecal occult blood test (FOBT) of the stool. You may have this test every year starting at age 38. Flexible sigmoidoscopy or colonoscopy. You may have a sigmoidoscopy every 5 years or a colonoscopy every 10 years starting at age 5. Hepatitis C blood test. Hepatitis B blood test. Sexually transmitted disease (STD) testing. Diabetes screening. This is done by checking your blood sugar (glucose) after you have not eaten for a while (fasting). You may have this done every 1-3 years. Bone density  scan. This is done to screen for osteoporosis. You may have this done starting at age 1. Mammogram. This may be done every 1-2 years. Talk to your health care provider about how often you should have regular mammograms. Talk with your health care provider about your test results, treatment options, and if necessary, the need for more tests. Vaccines  Your health care provider may recommend certain  vaccines, such as: Influenza vaccine. This is recommended every year. Tetanus, diphtheria, and acellular pertussis (Tdap, Td) vaccine. You may need a Td booster every 10 years. Zoster vaccine. You may need this after age 27. Pneumococcal 13-valent conjugate (PCV13) vaccine. One dose is recommended after age 69. Pneumococcal polysaccharide (PPSV23) vaccine. One dose is recommended after age 49. Talk to your health care provider about which screenings and vaccines you need and how often you need them. This information is not intended to replace advice given to you by your health care provider. Make sure you discuss any questions you have with your health care provider. Document Released: 02/06/2015 Document Revised: 09/30/2015 Document Reviewed: 11/11/2014 Elsevier Interactive Patient Education  2017 [employer].  Fall Prevention in the Home Falls can cause injuries. They can happen to people of all ages. There are many things you can do to make your home safe and to help prevent falls. What can I do on the outside of my home? Regularly fix the edges of walkways and driveways and fix any cracks. Remove anything that might make you trip as you walk through a door, such as a raised step or threshold. Trim any bushes or trees on the path to your home. Use bright outdoor lighting. Clear any walking paths of anything that might make someone trip, such as rocks or tools. Regularly check to see if handrails are loose or broken. Make sure that both sides of any steps have handrails. Any raised decks and porches should have guardrails on the edges. Have any leaves, snow, or ice cleared regularly. Use sand or salt on walking paths during winter. Clean up any spills in your garage right away. This includes oil or grease spills. What can I do in the bathroom? Use night lights. Install grab bars by the toilet and in the tub and shower. Do not use towel bars as grab bars. Use non-skid mats or decals in  the tub or shower. If you need to sit down in the shower, use a plastic, non-slip stool. Keep the floor dry. Clean up any water that spills on the floor as soon as it happens. Remove soap buildup in the tub or shower regularly. Attach bath mats securely with double-sided non-slip rug tape. Do not have throw rugs and other things on the floor that can make you trip. What can I do in the bedroom? Use night lights. Make sure that you have a light by your bed that is easy to reach. Do not use any sheets or blankets that are too big for your bed. They should not hang down onto the floor. Have a firm chair that has side arms. You can use this for support while you get dressed. Do not have throw rugs and other things on the floor that can make you trip. What can I do in the kitchen? Clean up any spills right away. Avoid walking on wet floors. Keep items that you use a lot in easy-to-reach places. If you need to reach something above you, use a strong step stool that has a grab bar. Keep electrical  cords out of the way. Do not use floor polish or wax that makes floors slippery. If you must use wax, use non-skid floor wax. Do not have throw rugs and other things on the floor that can make you trip. What can I do with my stairs? Do not leave any items on the stairs. Make sure that there are handrails on both sides of the stairs and use them. Fix handrails that are broken or loose. Make sure that handrails are as long as the stairways. Check any carpeting to make sure that it is firmly attached to the stairs. Fix any carpet that is loose or worn. Avoid having throw rugs at the top or bottom of the stairs. If you do have throw rugs, attach them to the floor with carpet tape. Make sure that you have a light switch at the top of the stairs and the bottom of the stairs. If you do not have them, ask someone to add them for you. What else can I do to help prevent falls? Wear shoes that: Do not have high  heels. Have rubber bottoms. Are comfortable and fit you well. Are closed at the toe. Do not wear sandals. If you use a stepladder: Make sure that it is fully opened. Do not climb a closed stepladder. Make sure that both sides of the stepladder are locked into place. Ask someone to hold it for you, if possible. Clearly mark and make sure that you can see: Any grab bars or handrails. First and last steps. Where the edge of each step is. Use tools that help you move around (mobility aids) if they are needed. These include: Canes. Walkers. Scooters. Crutches. Turn on the lights when you go into a dark area. Replace any light bulbs as soon as they burn out. Set up your furniture so you have a clear path. Avoid moving your furniture around. If any of your floors are uneven, fix them. If there are any pets around you, be aware of where they are. Review your medicines with your doctor. Some medicines can make you feel dizzy. This can increase your chance of falling. Ask your doctor what other things that you can do to help prevent falls. This information is not intended to replace advice given to you by your health care provider. Make sure you discuss any questions you have with your health care provider. Document Released: 11/06/2008 Document Revised: 06/18/2015 Document Reviewed: 02/14/2014 Elsevier Interactive Patient Education  2017 [employer].

## 2022-07-06 ENCOUNTER — Telehealth: Payer: Self-pay | Admitting: Family Medicine

## 2022-07-06 MED ORDER — FUROSEMIDE 40 MG PO TABS: 40.0000 mg | ORAL_TABLET | Freq: Every day | ORAL | 0 refills | Status: DC

## 2022-07-06 NOTE — Telephone Encounter (Signed)
furosemide (LASIX) 40 MG tablet [[id number]]  Pt needs a refill she has 1 pill left and her feet are swollen. [facility] on [address]

## 2022-07-07 ENCOUNTER — Other Ambulatory Visit: Payer: Self-pay | Admitting: Endocrinology

## 2022-07-18 ENCOUNTER — Other Ambulatory Visit: Payer: Self-pay | Admitting: Obstetrics and Gynecology

## 2022-07-18 DIAGNOSIS — Z1231 Encounter for screening mammogram for malignant neoplasm of breast: Secondary | ICD-10-CM

## 2022-07-19 ENCOUNTER — Other Ambulatory Visit (INDEPENDENT_AMBULATORY_CARE_PROVIDER_SITE_OTHER): Payer: 59

## 2022-07-19 DIAGNOSIS — E1165 Type 2 diabetes mellitus with hyperglycemia: Secondary | ICD-10-CM

## 2022-07-19 DIAGNOSIS — Z794 Long term (current) use of insulin: Secondary | ICD-10-CM

## 2022-07-20 ENCOUNTER — Ambulatory Visit: Payer: 59

## 2022-07-20 LAB — BASIC METABOLIC PANEL
BUN: 45 mg/dL — ABNORMAL HIGH (ref 6–23)
CO2: 26 mEq/L (ref 19–32)
Calcium: 9.6 mg/dL (ref 8.4–10.5)
Chloride: 102 mEq/L (ref 96–112)
Creatinine, Ser: 1.95 mg/dL — ABNORMAL HIGH (ref 0.40–1.20)
GFR: 25.92 mL/min — ABNORMAL LOW (ref >60.00)
Glucose, Bld: 98 mg/dL (ref 70–99)
Potassium: 4.9 mEq/L (ref 3.5–5.1)
Sodium: 136 mEq/L (ref 135–145)

## 2022-07-20 LAB — HEMOGLOBIN A1C: Hgb A1c MFr Bld: 6.1 % (ref 4.6–6.5)

## 2022-07-21 ENCOUNTER — Ambulatory Visit (INDEPENDENT_AMBULATORY_CARE_PROVIDER_SITE_OTHER): Payer: 59 | Admitting: Endocrinology

## 2022-07-21 ENCOUNTER — Encounter: Payer: Self-pay | Admitting: Endocrinology

## 2022-07-21 VITALS — BP 119/82 | HR 68 | Ht 63.5 in | Wt 227.6 lb

## 2022-07-21 DIAGNOSIS — I10 Essential (primary) hypertension: Secondary | ICD-10-CM

## 2022-07-21 DIAGNOSIS — Z794 Long term (current) use of insulin: Secondary | ICD-10-CM | POA: Diagnosis not present

## 2022-07-21 DIAGNOSIS — E1165 Type 2 diabetes mellitus with hyperglycemia: Secondary | ICD-10-CM

## 2022-07-21 DIAGNOSIS — E1121 Type 2 diabetes mellitus with diabetic nephropathy: Secondary | ICD-10-CM | POA: Diagnosis not present

## 2022-07-21 MED ORDER — OZEMPIC (1 MG/DOSE) 4 MG/3ML ~~LOC~~ SOPN: PEN_INJECTOR | SUBCUTANEOUS | 2 refills | Status: DC

## 2022-07-21 NOTE — Patient Instructions (Addendum)
Check blood sugars on waking up days a week  Also check blood sugars about 2 hours after meals and do this after different meals by rotation  Recommended blood sugar levels on waking up are 90-130 and about 2 hours after meal is 130-160  Please bring your blood sugar monitor to each visit, thank you   Walk in am daily

## 2022-07-21 NOTE — Progress Notes (Signed)
Patient ID: Stephanie [name], female   DOB: [birth year], 67 y.o.   MRN: [id number]           Reason for Appointment: Followup   History of Present Illness:          Diagnosis: Type 2 diabetes mellitus, date of diagnosis: 2003       Past history:  She was initially treated metformin at some point glimepiride also added and not clear what her level of control was in the first few years. Had been only taking 500 mg twice a day of metformin and Amaryl increased to 4 mg twice a day several years ago In the last few years her blood sugar control has been more difficult and she has been managed by an endocrinologist Because of poor control she had been tried on Janumet and Kombiglyze but she states that she could not tolerate them because of headaches Had poor control with Amaryl and metformin and also difficulty with weight gain she had started Trulicity in 11/2013 on her initial consultation Her weight on her initial visit was 240 pounds. Her A1c had gone down to 7.1% in 12/15 with using Trulicity which she was tolerating at that time However in 2/16 she stopped taking Trulicity as it was causing nausea , subsequently did not want to go on Tanzeum Because of markedly increased sugars she has been on premixed insulin since 4/16  Recent history:   INSULIN DOSE: Humalog mix 70/30,  32 units before supper  Tresiba 14 units daily  Oral hypoglycemic drugs the patient is taking are:, Ozempic 0.5 mg weekly   A1c is now 6.1  Current diabetes history, management, problems and blood sugar patterns:   She was told to stop her morning premixed insulin and with this her blood sugars during the day are not any higher, afternoon lab glucose was 98 However most of her monitoring is in the morning Occasionally will have slightly higher fasting readings also but unclear whether her readings are high after dinner She has taken her insulin before dinner consistently Also no reported hypoglycemia  overnight Despite reminders she is not exercising However with continuing Ozempic she is starting to lose weight and is down 5 pounds, no side effects like nausea with this For breakfast she is mostly eating eggs, only occasionally getting carbohydrates like bread   Side effects from medications have been: Januvia, Onglyza apparently caused headaches, Invokana: Candidiasis   Compliance with the medical regimen: Fair    Dinner is usually at 7 pm  Glucose monitoring:  done 1 times a day or more       Glucometer:  Accu-Chek guide .      Blood Glucose readings by meter review with average readings for 30 days:   PRE-MEAL Fasting Lunch Dinner Bedtime Overall  Glucose range: 122-153 108, 119 132    Mean/median:     119   POST-MEAL PC Breakfast PC Lunch PC Dinner  Glucose range:   ?  Mean/median:       Previously:  PRE-MEAL HS Lunch Dinner Bedtime Overall  Glucose range: 114-176      Mean/median:     127    Glycemic control:   Lab Results  Component Value Date   HGBA1C 6.1 07/19/2022   HGBA1C 6.3 03/16/2022   HGBA1C 6.1 11/10/2021   Lab Results  Component Value Date   MICROALBUR 83.5 (H) 03/16/2022   LDLCALC 83 03/16/2022   CREATININE 1.95 (H) 07/19/2022    Self-care: The diet  that the patient has been following is: tries to limit fats usually.      Meals: 2-3 meals per day. Breakfast is cereal or scrambled eggs/sausage.   Lunch is half sandwich, yogurt and fruit; dinner usually baked chicken, bread and vegetables, snacks are fruits or nuts. Not eating out regularly  Dietician visit: Most recent: 06/2015 CDE visit: 12/15              Weight history: Previous range 200-260  Wt Readings from Last 3 Encounters:  07/21/22 227 lb 9.6 oz (103.2 kg)  07/01/22 225 lb (102.1 kg)  03/24/22 235 lb 9.6 oz (106.9 kg)    Other active problems: discussed in review of systems   LABS:  Lab on 07/19/2022  Component Date Value Ref Range Status   Hgb A1c MFr Bld 07/19/2022  6.1  4.6 - 6.5 % Final   Glycemic Control Guidelines for People with Diabetes:Non Diabetic:  <6%Goal of Therapy: <7%Additional Action Suggested:  >8%    Sodium 07/19/2022 136  135 - 145 mEq/L Final   Potassium 07/19/2022 4.9  3.5 - 5.1 mEq/L Final   Chloride 07/19/2022 102  96 - 112 mEq/L Final   CO2 07/19/2022 26  19 - 32 mEq/L Final   Glucose, Bld 07/19/2022 98  70 - 99 mg/dL Final   BUN 16/10/9602 45 (H)  6 - 23 mg/dL Final   Creatinine, Ser 07/19/2022 1.95 (H)  0.40 - 1.20 mg/dL Final   GFR 54/09/8117 25.92 (L)  >60.00 mL/min Final   Calculated using the CKD-EPI Creatinine Equation (2021)   Calcium 07/19/2022 9.6  8.4 - 10.5 mg/dL Final     Allergies as of 07/21/2022       Reactions   Avapro [irbesartan] Other (See Comments)   headaches   Codeine Nausea And Vomiting   Dulaglutide Nausea And Vomiting   Lisinopril Itching        Medication List        Accurate as of July 21, 2022 10:29 AM. If you have any questions, ask your nurse or doctor.          Accu-Chek FastClix Lancets Misc Use accu chek fastclix lancets to check blood sugar 2-3 times daily. DX:E11.65   Accu-Chek Guide Me w/Device Kit 1 each by Does not apply route 3 (three) times daily. Use accu chek guide me to check blood sugar 2-3 times daily. DX:E11.65   Accu-Chek Guide test strip Generic drug: glucose blood USE TO CHECK BLOOD GLUCOSE THREE TO FOUR TIMES DAILY   acetaminophen 325 MG tablet Commonly known as: TYLENOL Take 2 tablets (650 mg total) by mouth every 6 (six) hours as needed for mild pain, fever or headache.   albuterol 108 (90 Base) MCG/ACT inhaler Commonly known as: VENTOLIN HFA Inhale 2 puffs into the lungs every 6 (six) hours as needed for wheezing or shortness of breath.   allopurinol 100 MG tablet Commonly known as: ZYLOPRIM TAKE 1/2 (ONE-HALF) TABLET BY MOUTH EVERY OTHER DAY   amLODipine 5 MG tablet Commonly known as: NORVASC Take 1 tablet (5 mg total) by mouth daily.    ascorbic acid 500 MG tablet Commonly known as: VITAMIN C Take 1 tablet (500 mg total) by mouth 3 (three) times daily.   ferrous sulfate 325 (65 FE) MG tablet Take 1 tablet (325 mg total) by mouth 3 (three) times daily with meals. What changed: when to take this   furosemide 40 MG tablet Commonly known as: LASIX Take 1 tablet (40 mg  total) by mouth daily.   hydrocortisone valerate cream 0.2 % Commonly known as: WESTCORT Apply 1 application topically daily as needed (rash).   hydrocortisone valerate cream 0.2 % Commonly known as: WESTCORT Apply 1 Application topically 2 (two) times daily.   Insulin Lispro Prot & Lispro (75-25) 100 UNIT/ML Kwikpen Commonly known as: HumaLOG Mix 75/25 KwikPen INJECT 10 UNITS SUBCUTANEOUSLY BEFORE BREAKFAST AND  48  UNITS  BEFORE  DINNER What changed: additional instructions   labetalol 100 MG tablet Commonly known as: NORMODYNE Take 1 tablet by mouth twice daily   Ozempic (0.25 or 0.5 MG/DOSE) 2 MG/3ML Sopn Generic drug: Semaglutide(0.25 or 0.5MG /DOS) INJECT 0.5MG  INTO THE SKIN ONCE WEEKY   potassium chloride 10 MEQ tablet Commonly known as: KLOR-CON M Take 1 tablet by mouth once daily   ReliOn Pen Needles 31G X 6 MM Misc Generic drug: Insulin Pen Needle USE 1  THREE TIMES DAILY   BD Pen Needle Nano 2nd Gen 32G X 4 MM Misc Generic drug: Insulin Pen Needle USE 1  THREE TIMES DAILY   rosuvastatin 20 MG tablet Commonly known as: CRESTOR Take 1 tablet by mouth once daily   Tresiba FlexTouch 100 UNIT/ML FlexTouch Pen Generic drug: insulin degludec Inject 14 Units into the skin daily.   valsartan 160 MG tablet Commonly known as: DIOVAN Take 1 tablet by mouth once daily        Allergies:  Allergies  Allergen Reactions   Avapro [Irbesartan] Other (See Comments)    headaches   Codeine Nausea And Vomiting   Dulaglutide Nausea And Vomiting   Lisinopril Itching    Past Medical History:  Diagnosis Date   Abnormal Pap smear     Asthma    Diabetes mellitus    Hyperlipidemia    Hypertension    LGSIL (low grade squamous intraepithelial dysplasia)     Past Surgical History:  Procedure Laterality Date   ABDOMINAL HYSTERECTOMY     bladder surgery     BOWEL RESECTION N/A 10/23/2020   Procedure: SMALL BOWEL RESECTION;  Surgeon: Kinsinger, De Blanch, MD;  Location: [facility] OR;  Service: General;  Laterality: N/A;   COLPOSCOPY     INCISIONAL HERNIA REPAIR N/A 10/23/2020   Procedure: OPEN HERNIA REPAIR INCISIONAL WITH MESH;  Surgeon: Sheliah Hatch, De Blanch, MD;  Location: [facility] OR;  Service: General;  Laterality: N/A;   INSERTION OF MESH N/A 10/23/2020   Procedure: INSERTION OF MESH;  Surgeon: Sheliah Hatch De Blanch, MD;  Location: [facility] OR;  Service: General;  Laterality: N/A;   LAPAROTOMY N/A 10/23/2020   Procedure: EXPLORATORY LAPAROTOMY;  Surgeon: Rodman Pickle, MD;  Location: [facility] OR;  Service: General;  Laterality: N/A;    Family History  Problem Relation Age of Onset   Diabetes Mother    Diabetes Maternal Grandmother    Heart disease Neg Hx    Hypertension Neg Hx     Social History:  reports that she has never smoked. She has never used smokeless tobacco. She reports that she does not drink alcohol and does not use drugs.    Review of Systems    Hypertension:   She is taking valsartan 160 mg, and labetalol 100 mg, not clear if she is taking amlodipine  She is followed by nephrologist periodically, last visit 03/2022  BP Readings from Last 3 Encounters:  07/21/22 119/82  03/24/22 (!) 152/82  03/18/22 124/80   CKD: Renal function has been variable but recently better  She has been seen by  nephrologist  regularly  On Lasix 40 for edema   Lab Results  Component Value Date   CREATININE 1.95 (H) 07/19/2022   CREATININE 2.33 (H) 03/16/2022   CREATININE 2.2 (A) 12/22/2021   Lab Results  Component Value Date   K 4.9 07/19/2022        Hypercholesterolemia:    LDL has been previously high, around  180 baseline She has been regular with her Crestor 20 mg dose and her LDL is at target   Lab Results  Component Value Date   CHOL 147 03/16/2022   CHOL 135 07/06/2021   CHOL 131 12/22/2020   Lab Results  Component Value Date   HDL 39.20 03/16/2022   HDL 41.20 07/06/2021   HDL 36.10 (L) 12/22/2020   Lab Results  Component Value Date   LDLCALC 83 03/16/2022   LDLCALC 78 07/06/2021   LDLCALC 59 12/22/2020   Lab Results  Component Value Date   TRIG 124.0 03/16/2022   TRIG 79.0 07/06/2021   TRIG 179.0 (H) 12/22/2020   Lab Results  Component Value Date   CHOLHDL 4 03/16/2022   CHOLHDL 3 07/06/2021   CHOLHDL 4 12/22/2020   Lab Results  Component Value Date   LDLDIRECT 89.0 12/05/2016   LDLDIRECT 183.0 10/01/2015                Diabetic foot exam last done in 2/24   Complications from diabetes: Proliferative retinopathy, microalbuminuria    Physical Examination:  BP 119/82 (BP Location: Left Arm, Patient Position: Sitting, Cuff Size: Large)   Pulse 68   Ht 5' 3.5" (1.613 m)   Wt 227 lb 9.6 oz (103.2 kg)   SpO2 96%   BMI 39.69 kg/m    No ankle edema present  ASSESSMENT:  Diabetes type 2, with obesity, insulin requiring  See history of present illness for detailed discussion of his current management, blood sugar patterns and problems identified  Her A1c is 6.1 compared to 6.3  She is on premixed insulin once a day, Tresiba 14 units, Ozempic 0.5 mg weekly  Glucose monitoring shows fairly good blood sugars although mildly increased fasting  She does not like to use CGM Also forgets to check blood sugars after meals including after supper and difficult to adjust her insulin dose currently Although she is not exercising she has lost some weight with starting Ozempic 0.5 mg   Plan:   She was reminded to check blood sugars consistently after dinner at night Also to report if fasting readings are consistently high She needs to start walking early in  the morning Blood sugar targets at different times were discussed If blood sugars are consistently high after dinner she will need to cut back on carbohydrates and snacks No change in insulin doses as yet   Hypertension: Blood pressure is better controlled now  RENAL dysfunction: This is relatively improved now, labs forwarded to nephrologist Proteinuria followed by nephrologist  She will also be followed by nephrologist for hyperuricemia  Follow-up in 3 months   There are no Patient Instructions on file for this visit.     Reather Littler 07/21/2022, 10:29 AM   Note: This office note was prepared with Dragon voice recognition system technology. Any transcriptional errors that result from this process are unintentional.

## 2022-08-02 ENCOUNTER — Ambulatory Visit: Payer: 59

## 2022-08-07 ENCOUNTER — Other Ambulatory Visit: Payer: Self-pay | Admitting: Endocrinology

## 2022-08-17 ENCOUNTER — Ambulatory Visit
Admission: RE | Admit: 2022-08-17 | Discharge: 2022-08-17 | Disposition: A | Discharge status: Home / Self Care | Payer: 59 | Source: Ambulatory Visit | Attending: Obstetrics and Gynecology | Admitting: Obstetrics and Gynecology

## 2022-08-17 DIAGNOSIS — Z1231 Encounter for screening mammogram for malignant neoplasm of breast: Secondary | ICD-10-CM

## 2022-08-24 ENCOUNTER — Encounter (INDEPENDENT_AMBULATORY_CARE_PROVIDER_SITE_OTHER): Payer: Self-pay

## 2022-09-03 ENCOUNTER — Other Ambulatory Visit: Payer: Self-pay | Admitting: Endocrinology

## 2022-09-07 ENCOUNTER — Other Ambulatory Visit: Payer: Self-pay | Admitting: Family Medicine

## 2022-09-07 ENCOUNTER — Other Ambulatory Visit: Payer: Self-pay | Admitting: Endocrinology

## 2022-09-21 ENCOUNTER — Telehealth: Payer: Self-pay | Admitting: Family Medicine

## 2022-09-21 ENCOUNTER — Ambulatory Visit: Payer: 59 | Admitting: Family Medicine

## 2022-09-21 NOTE — Telephone Encounter (Signed)
NS no reason letter printed

## 2022-09-22 NOTE — Telephone Encounter (Signed)
1st no show w/in 12 months (05/2021 last no show), letter sent via mail

## 2022-09-28 ENCOUNTER — Other Ambulatory Visit: Payer: Self-pay | Admitting: Family Medicine

## 2022-10-27 ENCOUNTER — Other Ambulatory Visit: Payer: 59

## 2022-10-28 ENCOUNTER — Other Ambulatory Visit: Payer: Self-pay | Admitting: Family Medicine

## 2022-10-28 DIAGNOSIS — Z1211 Encounter for screening for malignant neoplasm of colon: Secondary | ICD-10-CM

## 2022-10-28 DIAGNOSIS — Z1212 Encounter for screening for malignant neoplasm of rectum: Secondary | ICD-10-CM

## 2022-11-02 ENCOUNTER — Ambulatory Visit: Payer: 59 | Admitting: Endocrinology

## 2022-11-04 ENCOUNTER — Other Ambulatory Visit: Payer: Self-pay

## 2022-11-04 DIAGNOSIS — E1165 Type 2 diabetes mellitus with hyperglycemia: Secondary | ICD-10-CM

## 2022-11-04 MED ORDER — OZEMPIC (1 MG/DOSE) 4 MG/3ML ~~LOC~~ SOPN
PEN_INJECTOR | SUBCUTANEOUS | 2 refills | Status: DC
Start: 2022-11-04 — End: 2023-01-24

## 2022-12-13 ENCOUNTER — Other Ambulatory Visit: Payer: Self-pay

## 2022-12-13 ENCOUNTER — Telehealth: Payer: Self-pay

## 2022-12-13 DIAGNOSIS — E782 Mixed hyperlipidemia: Secondary | ICD-10-CM

## 2022-12-13 DIAGNOSIS — I10 Essential (primary) hypertension: Secondary | ICD-10-CM

## 2022-12-13 MED ORDER — POTASSIUM CHLORIDE CRYS ER 10 MEQ PO TBCR: 10.0000 meq | EXTENDED_RELEASE_TABLET | Freq: Every day | ORAL | 0 refills | Status: AC

## 2022-12-13 MED ORDER — LABETALOL HCL 100 MG PO TABS: 100.0000 mg | ORAL_TABLET | Freq: Two times a day (BID) | ORAL | 3 refills | Status: AC

## 2022-12-13 NOTE — Patient Outreach (Signed)
Attempted to contact patient regarding DM eye, colorectal exam. Left voicemail for patient to return my call at [phone].  Nicholes Rough, CMA Care [facility] [employer]

## 2022-12-23 ENCOUNTER — Other Ambulatory Visit: Payer: Self-pay | Admitting: Family Medicine

## 2022-12-23 DIAGNOSIS — Z8739 Personal history of other diseases of the musculoskeletal system and connective tissue: Secondary | ICD-10-CM

## 2023-01-24 ENCOUNTER — Other Ambulatory Visit: Payer: Self-pay | Admitting: Endocrinology

## 2023-01-24 DIAGNOSIS — E1165 Type 2 diabetes mellitus with hyperglycemia: Secondary | ICD-10-CM

## 2023-01-26 ENCOUNTER — Other Ambulatory Visit: Payer: 59

## 2023-01-26 ENCOUNTER — Telehealth: Payer: Self-pay

## 2023-01-26 DIAGNOSIS — E1165 Type 2 diabetes mellitus with hyperglycemia: Secondary | ICD-10-CM | POA: Diagnosis not present

## 2023-01-26 DIAGNOSIS — Z794 Long term (current) use of insulin: Secondary | ICD-10-CM | POA: Diagnosis not present

## 2023-01-26 NOTE — Telephone Encounter (Signed)
 Orders Placed This Encounter  Procedures   Comprehensive metabolic panel   Lipid panel   Hemoglobin A1c

## 2023-01-27 LAB — COMPREHENSIVE METABOLIC PANEL
AG Ratio: 1.3 (calc) (ref 1.0–2.5)
ALT: 12 U/L (ref 6–29)
AST: 14 U/L (ref 10–35)
Albumin: 4.3 g/dL (ref 3.6–5.1)
Alkaline phosphatase (APISO): 61 U/L (ref 37–153)
BUN/Creatinine Ratio: 20 (calc) (ref 6–22)
BUN: 39 mg/dL — ABNORMAL HIGH (ref 7–25)
CO2: 26 mmol/L (ref 20–32)
Calcium: 9.9 mg/dL (ref 8.6–10.4)
Chloride: 108 mmol/L (ref 98–110)
Creat: 1.99 mg/dL — ABNORMAL HIGH (ref 0.50–1.05)
Globulin: 3.4 g/dL (ref 1.9–3.7)
Glucose, Bld: 121 mg/dL — ABNORMAL HIGH (ref 65–99)
Potassium: 4.6 mmol/L (ref 3.5–5.3)
Sodium: 142 mmol/L (ref 135–146)
Total Bilirubin: 0.5 mg/dL (ref 0.2–1.2)
Total Protein: 7.7 g/dL (ref 6.1–8.1)

## 2023-01-27 LAB — LIPID PANEL
Cholesterol: 195 mg/dL (ref <200)
HDL: 52 mg/dL (ref >=50)
LDL Cholesterol (Calc): 123 mg/dL — ABNORMAL HIGH
Non-HDL Cholesterol (Calc): 143 mg/dL — ABNORMAL HIGH (ref <130)
Total CHOL/HDL Ratio: 3.8 (calc) (ref <5.0)
Triglycerides: 92 mg/dL (ref <150)

## 2023-01-27 LAB — HEMOGLOBIN A1C
Hgb A1c MFr Bld: 6.2 %{Hb} — ABNORMAL HIGH (ref <5.7)
Mean Plasma Glucose: 131 mg/dL
eAG (mmol/L): 7.3 mmol/L

## 2023-02-02 ENCOUNTER — Telehealth (INDEPENDENT_AMBULATORY_CARE_PROVIDER_SITE_OTHER): Payer: 59 | Admitting: Endocrinology

## 2023-02-02 ENCOUNTER — Ambulatory Visit
Admission: RE | Admit: 2023-02-02 | Discharge: 2023-02-02 | Disposition: A | Discharge status: Home / Self Care | Payer: 59 | Source: Ambulatory Visit | Attending: Family Medicine | Admitting: Family Medicine

## 2023-02-02 ENCOUNTER — Encounter: Payer: Self-pay | Admitting: Endocrinology

## 2023-02-02 DIAGNOSIS — Z794 Long term (current) use of insulin: Secondary | ICD-10-CM

## 2023-02-02 DIAGNOSIS — E1165 Type 2 diabetes mellitus with hyperglycemia: Secondary | ICD-10-CM | POA: Diagnosis not present

## 2023-02-02 DIAGNOSIS — E2839 Other primary ovarian failure: Secondary | ICD-10-CM

## 2023-02-02 DIAGNOSIS — E118 Type 2 diabetes mellitus with unspecified complications: Secondary | ICD-10-CM | POA: Diagnosis not present

## 2023-02-02 MED ORDER — INSULIN LISPRO PROT & LISPRO (75-25 MIX) 100 UNIT/ML KWIKPEN: PEN_INJECTOR | SUBCUTANEOUS | 3 refills | Status: DC

## 2023-02-02 MED ORDER — TRESIBA FLEXTOUCH 100 UNIT/ML ~~LOC~~ SOPN: 14.0000 [IU] | PEN_INJECTOR | Freq: Every day | SUBCUTANEOUS | 3 refills | Status: DC

## 2023-02-02 MED ORDER — OZEMPIC (1 MG/DOSE) 4 MG/3ML ~~LOC~~ SOPN: PEN_INJECTOR | SUBCUTANEOUS | 3 refills | Status: DC

## 2023-02-02 NOTE — Progress Notes (Signed)
 I connected with  Channing LITTIE Agent on 02/02/23 by a video enabled telemedicine application and verified that I am speaking with the correct person using two identifiers.   I discussed the limitations of evaluation and management by telemedicine. The patient expressed understanding and agreed to proceed.  Provider location : Office Patient location: at home  Outpatient Endocrinology Note Isauro Skelley, MD   Patient's Name: Stephanie [name]    DOB: [birth year]    MRN: [id number]                                                    REASON OF VISIT: Follow up for type 2 diabetes mellitus  PCP: Berneta Elsie Sayre, MD  HISTORY OF PRESENT ILLNESS:   Stephanie [name] is a 68 y.o. old female with past medical history listed below, is here for follow up for type 2 diabetes mellitus.   Pertinent Diabetes History: Patient was previously seen by Dr. Von and was last time seen in June 2024.  Patient was diagnosed with type 2 diabetes mellitus in 2003.  Patient has controlled type 2 diabetes mellitus.  Insulin  therapy was started in 2016.  Chronic Diabetes Complications : Retinopathy: yes /proliferative retinopathy. Last ophthalmology exam was done on ? annually, following with ophthalmology regularly.  Nephropathy: Microalbuminuria present, CKD on ACE/ARB /valsartan , following with nephrology Peripheral neuropathy: no Coronary artery disease: no Stroke: no  Relevant comorbidities and cardiovascular risk factors: Obesity: yes There is no height or weight on file to calculate BMI.  Hypertension: Yes  Hyperlipidemia : Yes, on statin   Current / Home Diabetic regimen includes:  Humalog  mix 70/30,  34 units before supper.  No longer taking morning premixed insulin . Tresiba  14 units daily.  Ozempic  1 mg weekly.  Prior diabetic medications: Metformin , glimepiride , Kombiglyze Januvia, Onglyza apparently caused headaches, Invokana : Candidiasis .  Trulicity  in the past.  Janumet in the past.  Amaryl   caused weight gain in the past.  Trulicity  was stopped due to nausea.  Glycemic data:   She has Accu-Chek glucometer and checking mostly daily in the morning fasting and occasionally in the afternoon and in the evening.  Some of the blood sugars noted as follows patient read through the glucometer mostly fasting blood sugar.  114, 119, 109, 133, 96, 87,  105, 117, 104, 115, 92, 81  Hypoglycemia: Patient has no hypoglycemic episodes. Patient has hypoglycemia awareness.  Factors modifying glucose control: 1.  Diabetic diet assessment: 2 meals a day.   2.  Staying active or exercising: No formal exercise.  3.  Medication compliance: compliant all of the time.  Interval history  Patient had a mechanical fall and having muscle ache this afternoon, we had virtual video visit today.  She thinks is mostly sprain no fracture.  Advised to discuss with primary care provider as well.  If the pain is severe, chest pain or trouble breathing asked to go to ER.  She denies having hypoglycemia during the fall.  Glucose data as reviewed above.  Diabetes regimen as noted above.  She has no other complaints today.  Recent lab results reviewed.  Hemoglobin A1c 6.2%.  Normal electrolytes.  Renal function is stable.  Normal liver enzymes.  Lipid panel with mildly elevated LDL 123.   REVIEW OF SYSTEMS As per history of present illness.   PAST  MEDICAL HISTORY: Past Medical History:  Diagnosis Date   Abnormal Pap smear    Asthma    Diabetes mellitus    Hyperlipidemia    Hypertension    LGSIL (low grade squamous intraepithelial dysplasia)     PAST SURGICAL HISTORY: Past Surgical History:  Procedure Laterality Date   ABDOMINAL HYSTERECTOMY     bladder surgery     BOWEL RESECTION N/A 10/23/2020   Procedure: SMALL BOWEL RESECTION;  Surgeon: Kinsinger, Herlene Righter, MD;  Location: [facility] OR;  Service: General;  Laterality: N/A;   COLPOSCOPY     INCISIONAL HERNIA REPAIR N/A 10/23/2020   Procedure: OPEN  HERNIA REPAIR INCISIONAL WITH MESH;  Surgeon: Stevie, Herlene Righter, MD;  Location: [facility] OR;  Service: General;  Laterality: N/A;   INSERTION OF MESH N/A 10/23/2020   Procedure: INSERTION OF MESH;  Surgeon: Stevie Herlene Righter, MD;  Location: [facility] OR;  Service: General;  Laterality: N/A;   LAPAROTOMY N/A 10/23/2020   Procedure: EXPLORATORY LAPAROTOMY;  Surgeon: Stevie Herlene Righter, MD;  Location: [facility] OR;  Service: General;  Laterality: N/A;    ALLERGIES: Allergies  Allergen Reactions   Avapro  [Irbesartan ] Other (See Comments)    headaches   Codeine Nausea And Vomiting   Dulaglutide  Nausea And Vomiting   Lisinopril  Itching    FAMILY HISTORY:  Family History  Problem Relation Age of Onset   Diabetes Mother    Diabetes Maternal Grandmother    Heart disease Neg Hx    Hypertension Neg Hx     SOCIAL HISTORY: Social History   Socioeconomic History   Marital status: Single    Spouse name: Not on file   Number of children: Not on file   Years of education: Not on file   Highest education level: Not on file  Occupational History   Not on file  Tobacco Use   Smoking status: Never   Smokeless tobacco: Never  Vaping Use   Vaping status: Never Used  Substance and Sexual Activity   Alcohol use: No   Drug use: No   Sexual activity: Yes    Birth control/protection: Surgical    Comment: hyst  Other Topics Concern   Not on file  Social History Narrative   Not on file   Social Drivers of Health   Financial Resource Strain: Low Risk  (07/01/2022)   Overall Financial Resource Strain (CARDIA)    Difficulty of Paying Living Expenses: Not hard at all  Food Insecurity: No Food Insecurity (07/01/2022)   Hunger Vital Sign    Worried About Running Out of Food in the Last Year: Never true    Ran Out of Food in the Last Year: Never true  Transportation Needs: No Transportation Needs (07/01/2022)   PRAPARE - Administrator, Civil Service (Medical): No    Lack of Transportation  (Non-Medical): No  Physical Activity: Inactive (07/01/2022)   Exercise Vital Sign    Days of Exercise per Week: 0 days    Minutes of Exercise per Session: 0 min  Stress: No Stress Concern Present (07/01/2022)   [employer] of Occupational Health - Occupational Stress Questionnaire    Feeling of Stress : Not at all  Social Connections: Moderately Isolated (06/29/2021)   Social Connection and Isolation Panel [NHANES]    Frequency of Communication with Friends and Family: Three times a week    Frequency of Social Gatherings with Friends and Family: Three times a week    Attends Religious Services: More than 4 times  per year    Active Member of Clubs or Organizations: No    Attends Banker Meetings: Never    Marital Status: Never married    MEDICATIONS:  Current Outpatient Medications  Medication Sig Dispense Refill   Accu-Chek FastClix Lancets MISC Use accu chek fastclix lancets to check blood sugar 2-3 times daily. DX:E11.65 300 each 1   acetaminophen  (TYLENOL ) 325 MG tablet Take 2 tablets (650 mg total) by mouth every 6 (six) hours as needed for mild pain, fever or headache.     albuterol  (VENTOLIN  HFA) 108 (90 Base) MCG/ACT inhaler Inhale 2 puffs into the lungs every 6 (six) hours as needed for wheezing or shortness of breath. 8 g 0   allopurinol  (ZYLOPRIM ) 100 MG tablet TAKE 1/2 (ONE-HALF) TABLET BY MOUTH EVERY OTHER DAY 8 tablet 5   amLODipine  (NORVASC ) 5 MG tablet Take 1 tablet (5 mg total) by mouth daily. 90 tablet 1   ascorbic acid  (VITAMIN C) 500 MG tablet Take 1 tablet (500 mg total) by mouth 3 (three) times daily. 90 tablet 0   Blood Glucose Monitoring Suppl (ACCU-CHEK GUIDE ME) w/Device KIT 1 each by Does not apply route 3 (three) times daily. Use accu chek guide me to check blood sugar 2-3 times daily. DX:E11.65 1 kit 0   ferrous sulfate  325 (65 FE) MG tablet Take 1 tablet (325 mg total) by mouth 3 (three) times daily with meals. (Patient taking differently: Take  325 mg by mouth daily.) 90 tablet 0   furosemide  (LASIX ) 40 MG tablet Take 1 tablet (40 mg total) by mouth daily. 90 tablet 0   glucose blood (ACCU-CHEK GUIDE) test strip USE TO CHECK BLOOD GLUCOSE THREE TO FOUR TIMES DAILY 300 each 3   hydrocortisone  valerate cream (WESTCORT ) 0.2 % Apply 1 application topically daily as needed (rash).     hydrocortisone  valerate cream (WESTCORT ) 0.2 % APPLY  CREAM TOPICALLY TO AFFECTED AREA TWICE DAILY 45 g 0   Insulin  Pen Needle (BD PEN NEEDLE NANO 2ND GEN) 32G X 4 MM MISC USE 1  THREE TIMES DAILY 100 each 3   Insulin  Pen Needle (RELION PEN NEEDLES) 31G X 6 MM MISC USE 1  THREE TIMES DAILY 200 each 0   labetalol  (NORMODYNE ) 100 MG tablet Take 1 tablet (100 mg total) by mouth 2 (two) times daily. 60 tablet 3   potassium chloride  (KLOR-CON  M) 10 MEQ tablet Take 1 tablet (10 mEq total) by mouth daily. 90 tablet 0   rosuvastatin  (CRESTOR ) 20 MG tablet Take 1 tablet by mouth once daily 30 tablet 3   valsartan  (DIOVAN ) 160 MG tablet Take 1 tablet by mouth once daily 90 tablet 3   Insulin  Lispro Prot & Lispro (HUMALOG  MIX 75/25 KWIKPEN) (75-25) 100 UNIT/ML Kwikpen INJECT 34 UNITS  BEFORE  DINNER 30 mL 3   Semaglutide , 1 MG/DOSE, (OZEMPIC , 1 MG/DOSE,) 4 MG/3ML SOPN INJECT 1MG  INTO THE SKIN ONCE WEEKLY 9 mL 3   TRESIBA  FLEXTOUCH 100 UNIT/ML FlexTouch Pen Inject 14 Units into the skin daily. 15 mL 3   No current facility-administered medications for this visit.    PHYSICAL EXAM: There were no vitals filed for this visit. There is no height or weight on file to calculate BMI.  Wt Readings from Last 3 Encounters:  07/21/22 227 lb 9.6 oz (103.2 kg)  07/01/22 225 lb (102.1 kg)  03/24/22 235 lb 9.6 oz (106.9 kg)    General: Well developed, well nourished female in no apparent distress.  HEENT: AT/[place], no external lesions.  Eyes: Conjunctiva clear and no icterus. Neurologic: Alert, oriented, normal speech Psychiatric: Does not appear depressed or anxious  Diabetic  Foot Exam - Simple   No data filed    LABS Reviewed Lab Results  Component Value Date   HGBA1C 6.2 (H) 01/26/2023   HGBA1C 6.1 07/19/2022   HGBA1C 6.3 03/16/2022   Lab Results  Component Value Date   FRUCTOSAMINE 216 11/10/2021   FRUCTOSAMINE 232 01/04/2019   FRUCTOSAMINE 288 (H) 05/21/2018   Lab Results  Component Value Date   CHOL 195 01/26/2023   HDL 52 01/26/2023   LDLCALC 123 (H) 01/26/2023   LDLDIRECT 89.0 12/05/2016   TRIG 92 01/26/2023   CHOLHDL 3.8 01/26/2023   Lab Results  Component Value Date   MICRALBCREAT 72.8 (H) 03/16/2022   MICRALBCREAT 1,125 12/22/2021   Lab Results  Component Value Date   CREATININE 1.99 (H) 01/26/2023   Lab Results  Component Value Date   GFR 25.92 (L) 07/19/2022    ASSESSMENT / PLAN  1. Controlled type 2 diabetes mellitus with complication, with long-term current use of insulin  (HCC)   2. Type 2 diabetes mellitus with hyperglycemia, with long-term current use of insulin  (HCC)     Diabetes Mellitus type 2, complicated by diabetic retinopathy and CKD. - Diabetic status / severity: Controlled.  Lab Results  Component Value Date   HGBA1C 6.2 (H) 01/26/2023    - Hemoglobin A1c goal : <7%  Patient has mostly acceptable blood sugar with occasional low normal blood sugar.  No reported hypoglycemia.  - Medications: See below.  I) continue Tresiba  14 units in the evening. II) crease Humalog  mix 70/30 from 34 to 30 units with supper. III) continue Ozempic  1 mg weekly.  - Home glucose testing: Check in the morning fasting and at bedtime and sometime in the afternoon. - Discussed/ Gave Hypoglycemia treatment plan.  # Consult : not required at this time.   # Annual urine for microalbuminuria/ creatinine ratio, + microalbuminuria currently, continue ACE/ARB /valsartan .  Has CKD following with nephrology. Last  Lab Results  Component Value Date   MICRALBCREAT 72.8 (H) 03/16/2022    # Foot check nightly.  # She has  diabetic retinopathy, following regularly with ophthalmology.   - Diet: Make healthy diabetic food choices - Life style / activity / exercise: Discussed.  2. Blood pressure  -  BP Readings from Last 1 Encounters:  07/21/22 119/82    - Control is in target.  - No change in current plans.  3. Lipid status / Hyperlipidemia - Last  Lab Results  Component Value Date   LDLCALC 123 (H) 01/26/2023   - Continue rosuvastatin  20 mg daily.  Advised to stay on diet control and limiting fatty food.  Will recheck in the future and consider increasing rosuvastatin  if LDL remains high.  Diagnoses and all orders for this visit:  Controlled type 2 diabetes mellitus with complication, with long-term current use of insulin  (HCC) -     BASIC METABOLIC PANEL WITH GFR -     Hemoglobin A1c -     Microalbumin / creatinine urine ratio  Type 2 diabetes mellitus with hyperglycemia, with long-term current use of insulin  (HCC) -     Semaglutide , 1 MG/DOSE, (OZEMPIC , 1 MG/DOSE,) 4 MG/3ML SOPN; INJECT 1MG  INTO THE SKIN ONCE WEEKLY  Other orders -     Insulin  Lispro Prot & Lispro (HUMALOG  MIX 75/25 KWIKPEN) (75-25) 100 UNIT/ML Kwikpen; INJECT 34 UNITS  BEFORE  DINNER -     TRESIBA  FLEXTOUCH 100 UNIT/ML FlexTouch Pen; Inject 14 Units into the skin daily.    DISPOSITION Follow up in clinic in 3  months suggested.   All questions answered and patient verbalized understanding of the plan.  Mayela Bullard, MD [facility] [address] [place], [address] [place], [place] [address] Phone # [phone]  At least part of this note was generated using voice recognition software. Inadvertent word errors may have occurred, which were not recognized during the proofreading process.

## 2023-03-08 ENCOUNTER — Other Ambulatory Visit: Payer: Self-pay

## 2023-03-08 MED ORDER — BD PEN NEEDLE NANO 2ND GEN 32G X 4 MM MISC: 3 refills | Status: AC

## 2023-05-13 ENCOUNTER — Other Ambulatory Visit: Payer: Self-pay | Admitting: Family Medicine

## 2023-05-26 ENCOUNTER — Other Ambulatory Visit: Payer: Self-pay | Admitting: Family Medicine

## 2023-05-29 ENCOUNTER — Other Ambulatory Visit: Payer: Self-pay | Admitting: Family Medicine

## 2023-05-29 NOTE — Telephone Encounter (Signed)
 Copied from CRM [id number]. Topic: Clinical - Medication Refill >> May 29, 2023  4:17 PM [place] E wrote: Most Recent Primary Care Visit:  Provider: ALLEN, NICKEAH E  Department: [facility]  Visit Type: MEDICARE AWV, SEQUENTIAL  Date: 07/01/2022  Medication: hydrocortisone  valerate cream (WESTCORT ) 0.2  Has the patient contacted their pharmacy? Yes (Agent: If no, request that the patient contact the pharmacy for the refill. If patient does not wish to contact the pharmacy document the reason why and proceed with request.) (Agent: If yes, when and what did the pharmacy advise?)  Is this the correct pharmacy for this prescription? Yes If no, delete pharmacy and type the correct one.  This is the patient's preferred pharmacy:  [facility] [address], [place] - [address] [place] [address] Phone: [phone] Fax: [phone]   Has the prescription been filled recently? Yes  Is the patient out of the medication? Yes  Has the patient been seen for an appointment in the last year OR does the patient have an upcoming appointment? Yes  Can we respond through MyChart? Yes  Agent: Please be advised that Rx refills may take up to 3 business days. We ask that you follow-up with your pharmacy.

## 2023-05-29 NOTE — Telephone Encounter (Signed)
 Copied from CRM [id number]. Topic: Clinical - Prescription Issue >> May 29, 2023  4:19 PM [place]ique E wrote: Reason for CRM: Patient called in to refill her hydrocortisone  valerate cream (WESTCORT ) 0.2 medication, and an error popped up after it stated "No new order entered," after agent already submitted the refill request, was not able to pend this medication. Callback number for patient is [phone] for any questions.

## 2023-05-30 ENCOUNTER — Other Ambulatory Visit: Payer: Self-pay | Admitting: Family Medicine

## 2023-06-01 ENCOUNTER — Telehealth: Payer: Self-pay | Admitting: Family Medicine

## 2023-06-01 ENCOUNTER — Other Ambulatory Visit: Payer: Self-pay | Admitting: Family Medicine

## 2023-06-01 NOTE — Telephone Encounter (Unsigned)
 Copied from CRM [id number]. Topic: Clinical - Medication Refill >> Jun 01, 2023  1:16 PM Tanazia G wrote: Medication: hydrocortisone  valerate cream (WESTCORT ) 0.2 %   Has the patient contacted their pharmacy? Yes (Agent: If no, request that the patient contact the pharmacy for the refill. If patient does not wish to contact the pharmacy document the reason why and proceed with request.) (Agent: If yes, when and what did the pharmacy advise?)  This is the patient's preferred pharmacy:  [facility] [address], [place] - [address] [place]  [address] Phone: [phone] Fax: [phone]  Is this the correct pharmacy for this prescription? Yes If no, delete pharmacy and type the correct one.   Has the prescription been filled recently? Yes  Is the patient out of the medication? Yes  Has the patient been seen for an appointment in the last year OR does the patient have an upcoming appointment? Yes  Can we respond through MyChart? Yes  Agent: Please be advised that Rx refills may take up to 3 business days. We ask that you follow-up with your pharmacy.

## 2023-06-05 ENCOUNTER — Encounter: Payer: Self-pay | Admitting: Family Medicine

## 2023-06-05 ENCOUNTER — Ambulatory Visit (INDEPENDENT_AMBULATORY_CARE_PROVIDER_SITE_OTHER): Admitting: Family Medicine

## 2023-06-05 VITALS — BP 152/84 | HR 99 | Temp 97.6°F | Ht 63.0 in | Wt 206.8 lb

## 2023-06-05 DIAGNOSIS — Z8739 Personal history of other diseases of the musculoskeletal system and connective tissue: Secondary | ICD-10-CM

## 2023-06-05 DIAGNOSIS — E7800 Pure hypercholesterolemia, unspecified: Secondary | ICD-10-CM | POA: Diagnosis not present

## 2023-06-05 DIAGNOSIS — N1832 Chronic kidney disease, stage 3b: Secondary | ICD-10-CM | POA: Diagnosis not present

## 2023-06-05 DIAGNOSIS — Z1211 Encounter for screening for malignant neoplasm of colon: Secondary | ICD-10-CM | POA: Diagnosis not present

## 2023-06-05 DIAGNOSIS — I10 Essential (primary) hypertension: Secondary | ICD-10-CM

## 2023-06-05 DIAGNOSIS — E11319 Type 2 diabetes mellitus with unspecified diabetic retinopathy without macular edema: Secondary | ICD-10-CM | POA: Diagnosis not present

## 2023-06-05 DIAGNOSIS — L309 Dermatitis, unspecified: Secondary | ICD-10-CM

## 2023-06-05 MED ORDER — HYDROCORTISONE VALERATE 0.2 % EX CREA: TOPICAL_CREAM | CUTANEOUS | 0 refills | Status: DC

## 2023-06-05 NOTE — Progress Notes (Signed)
 Established Patient Office Visit   Subjective:  Patient ID: Stephanie [name], female    DOB: Mar 12, 1955  Age: 68 y.o. MRN: [id number]  Chief Complaint  Patient presents with   Medical Management of Chronic Issues    Follow up. Rx refill for Westcort  cream. Pt is not fasting.     HPI Encounter Diagnoses  Name Primary?   Stage 3b chronic kidney disease (HCC) Yes   History of gout    Uncontrolled hypertension    Elevated cholesterol    Screening for colon cancer    Diabetic retinopathy associated with type 2 diabetes mellitus, macular edema presence unspecified, unspecified laterality, unspecified retinopathy severity (HCC)    Eczema, unspecified type    For follow-up of above.  Last seen in February of last year and asked to return in 6 months.  She has also been seeing endocrinology and nephrology but may be lost to follow-up there as well.    Review of Systems  Constitutional: Negative.   HENT: Negative.    Eyes:  Negative for blurred vision, discharge and redness.  Respiratory: Negative.    Cardiovascular: Negative.   Gastrointestinal:  Negative for abdominal pain.  Genitourinary: Negative.   Musculoskeletal: Negative.  Negative for myalgias.  Skin:  Negative for rash.  Neurological:  Negative for tingling, loss of consciousness and weakness.  Endo/Heme/Allergies:  Negative for polydipsia.     Current Outpatient Medications:    Accu-Chek FastClix Lancets MISC, Use accu chek fastclix lancets to check blood sugar 2-3 times daily. DX:E11.65, Disp: 300 each, Rfl: 1   acetaminophen  (TYLENOL ) 325 MG tablet, Take 2 tablets (650 mg total) by mouth every 6 (six) hours as needed for mild pain, fever or headache., Disp: , Rfl:    albuterol  (VENTOLIN  HFA) 108 (90 Base) MCG/ACT inhaler, Inhale 2 puffs into the lungs every 6 (six) hours as needed for wheezing or shortness of breath., Disp: 8 g, Rfl: 0   allopurinol  (ZYLOPRIM ) 100 MG tablet, TAKE 1/2 (ONE-HALF) TABLET BY MOUTH EVERY  OTHER DAY, Disp: 8 tablet, Rfl: 5   Blood Glucose Monitoring Suppl (ACCU-CHEK GUIDE ME) w/Device KIT, 1 each by Does not apply route 3 (three) times daily. Use accu chek guide me to check blood sugar 2-3 times daily. DX:E11.65, Disp: 1 kit, Rfl: 0   ferrous sulfate  325 (65 FE) MG tablet, Take 1 tablet (325 mg total) by mouth 3 (three) times daily with meals. (Patient taking differently: Take 325 mg by mouth daily.), Disp: 90 tablet, Rfl: 0   furosemide  (LASIX ) 40 MG tablet, Take 1 tablet (40 mg total) by mouth daily., Disp: 90 tablet, Rfl: 0   glucose blood (ACCU-CHEK GUIDE) test strip, USE TO CHECK BLOOD GLUCOSE THREE TO FOUR TIMES DAILY, Disp: 300 each, Rfl: 3   Insulin  Lispro Prot & Lispro (HUMALOG  MIX 75/25 KWIKPEN) (75-25) 100 UNIT/ML Kwikpen, INJECT 34 UNITS  BEFORE  DINNER, Disp: 30 mL, Rfl: 3   Insulin  Pen Needle (BD PEN NEEDLE NANO 2ND GEN) 32G X 4 MM MISC, USE 1  THREE TIMES DAILY, Disp: 100 each, Rfl: 3   Insulin  Pen Needle (RELION PEN NEEDLES) 31G X 6 MM MISC, USE 1  THREE TIMES DAILY, Disp: 200 each, Rfl: 0   labetalol  (NORMODYNE ) 100 MG tablet, Take 1 tablet (100 mg total) by mouth 2 (two) times daily., Disp: 60 tablet, Rfl: 3   potassium chloride  (KLOR-CON  M) 10 MEQ tablet, Take 1 tablet (10 mEq total) by mouth daily., Disp: 90 tablet, Rfl:  0   rosuvastatin  (CRESTOR ) 20 MG tablet, Take 1 tablet by mouth once daily, Disp: 30 tablet, Rfl: 3   TRESIBA  FLEXTOUCH 100 UNIT/ML FlexTouch Pen, Inject 14 Units into the skin daily., Disp: 15 mL, Rfl: 3   valsartan  (DIOVAN ) 160 MG tablet, Take 1 tablet by mouth once daily, Disp: 90 tablet, Rfl: 3   amLODipine  (NORVASC ) 5 MG tablet, Take 1 tablet (5 mg total) by mouth daily. (Patient not taking: Reported on 06/05/2023), Disp: 90 tablet, Rfl: 1   ascorbic acid  (VITAMIN C) 500 MG tablet, Take 1 tablet (500 mg total) by mouth 3 (three) times daily. (Patient not taking: Reported on 06/05/2023), Disp: 90 tablet, Rfl: 0   hydrocortisone  valerate cream  (WESTCORT ) 0.2 %, Apply a thin coat to rash daily as needed., Disp: 45 g, Rfl: 0   Semaglutide , 1 MG/DOSE, (OZEMPIC , 1 MG/DOSE,) 4 MG/3ML SOPN, INJECT 1MG  INTO THE SKIN ONCE WEEKLY (Patient not taking: Reported on 06/05/2023), Disp: 9 mL, Rfl: 3   Objective:     BP (!) 152/84 (BP Location: Right Arm, Patient Position: Sitting, Cuff Size: Large)   Pulse 99   Temp 97.6 F (36.4 C) (Temporal)   Ht 5\' 3"  (1.6 m)   Wt 206 lb 12.8 oz (93.8 kg)   SpO2 96%   BMI 36.63 kg/m    Physical Exam Constitutional:      General: She is not in acute distress.    Appearance: Normal appearance. She is not ill-appearing, toxic-appearing or diaphoretic.  HENT:     Head: Normocephalic and atraumatic.     Right Ear: External ear normal.     Left Ear: External ear normal.     Mouth/Throat:     Mouth: Mucous membranes are moist.     Pharynx: Oropharynx is clear. No oropharyngeal exudate or posterior oropharyngeal erythema.  Eyes:     General: No scleral icterus.       Right eye: No discharge.        Left eye: No discharge.     Extraocular Movements: Extraocular movements intact.     Conjunctiva/sclera: Conjunctivae normal.     Pupils: Pupils are equal, round, and reactive to light.  Cardiovascular:     Rate and Rhythm: Normal rate and regular rhythm.  Pulmonary:     Effort: Pulmonary effort is normal. No respiratory distress.     Breath sounds: Normal breath sounds. No wheezing, rhonchi or rales.  Musculoskeletal:     Cervical back: No rigidity or tenderness.  Skin:    General: Skin is warm and dry.  Neurological:     Mental Status: She is alert and oriented to person, place, and time.  Psychiatric:        Mood and Affect: Mood normal.        Behavior: Behavior normal.      No results found for any visits on 06/05/23.    The 10-year ASCVD risk score (Arnett DK, et al., 2019) is: 33.1%    Assessment & Plan:   Stage 3b chronic kidney disease (HCC) -     Basic metabolic panel with  GFR -     Ambulatory referral to Nephrology  History of gout -     Uric acid -     Ambulatory referral to Nephrology  Uncontrolled hypertension -     Basic metabolic panel with GFR -     Ambulatory referral to Nephrology  Elevated cholesterol -     LDL cholesterol, direct -  Ambulatory referral to Nephrology  Screening for colon cancer  Diabetic retinopathy associated with type 2 diabetes mellitus, macular edema presence unspecified, unspecified laterality, unspecified retinopathy severity (HCC) -     Ambulatory referral to Ophthalmology  Eczema, unspecified type -     Hydrocortisone  Valerate; Apply a thin coat to rash daily as needed.  Dispense: 45 g; Refill: 0    Return in about 6 months (around 12/06/2023), or Follow-up with endocrinology and nephrology as planned..  Advised patient of the importance of regular follow-up with endocrinology for diabetes and nephrology for hypertension, elevated cholesterol and gout.  Information was given on Cologuard.  Refilled Westcort  cream  Tonna Frederic, MD

## 2023-06-06 ENCOUNTER — Ambulatory Visit: Payer: Self-pay | Admitting: Family Medicine

## 2023-06-06 LAB — BASIC METABOLIC PANEL WITH GFR
BUN: 38 mg/dL — ABNORMAL HIGH (ref 6–23)
CO2: 25 meq/L (ref 19–32)
Calcium: 9.5 mg/dL (ref 8.4–10.5)
Chloride: 107 meq/L (ref 96–112)
Creatinine, Ser: 1.86 mg/dL — ABNORMAL HIGH (ref 0.40–1.20)
GFR: 27.27 mL/min — ABNORMAL LOW (ref >60.00)
Glucose, Bld: 153 mg/dL — ABNORMAL HIGH (ref 70–99)
Potassium: 4.1 meq/L (ref 3.5–5.1)
Sodium: 142 meq/L (ref 135–145)

## 2023-06-06 LAB — URIC ACID: Uric Acid, Serum: 9.2 mg/dL — ABNORMAL HIGH (ref 2.4–7.0)

## 2023-06-06 LAB — LDL CHOLESTEROL, DIRECT: Direct LDL: 185 mg/dL

## 2023-06-21 ENCOUNTER — Other Ambulatory Visit: Payer: Self-pay

## 2023-06-21 DIAGNOSIS — E1165 Type 2 diabetes mellitus with hyperglycemia: Secondary | ICD-10-CM

## 2023-06-21 MED ORDER — ACCU-CHEK GUIDE TEST VI STRP: ORAL_STRIP | 12 refills | Status: AC

## 2023-06-29 ENCOUNTER — Telehealth: Payer: Self-pay | Admitting: Family Medicine

## 2023-06-29 NOTE — Telephone Encounter (Signed)
 Copied from CRM [id number]. Topic: Referral - Status >> Jun 29, 2023 11:08 AM Stephanie [name] wrote: Reason for CRM: Pateint was referred to [facility] 06/05/23, office called to let Dr. Buelah Carmel know that they've tried to reach out to patient several times and she has not returned there call. They will be putting referral to the side until patient gives them a call back.

## 2023-07-01 ENCOUNTER — Other Ambulatory Visit: Payer: Self-pay | Admitting: Family Medicine

## 2023-07-01 DIAGNOSIS — L309 Dermatitis, unspecified: Secondary | ICD-10-CM

## 2023-07-12 ENCOUNTER — Encounter: Payer: Self-pay | Admitting: Endocrinology

## 2023-07-12 ENCOUNTER — Ambulatory Visit: Payer: Self-pay | Admitting: Endocrinology

## 2023-07-12 ENCOUNTER — Ambulatory Visit (INDEPENDENT_AMBULATORY_CARE_PROVIDER_SITE_OTHER): Admitting: Endocrinology

## 2023-07-12 VITALS — BP 146/90 | HR 94 | Resp 20 | Ht 63.0 in | Wt 209.8 lb

## 2023-07-12 DIAGNOSIS — Z794 Long term (current) use of insulin: Secondary | ICD-10-CM | POA: Diagnosis not present

## 2023-07-12 DIAGNOSIS — E782 Mixed hyperlipidemia: Secondary | ICD-10-CM

## 2023-07-12 DIAGNOSIS — E118 Type 2 diabetes mellitus with unspecified complications: Secondary | ICD-10-CM | POA: Diagnosis not present

## 2023-07-12 LAB — POCT GLYCOSYLATED HEMOGLOBIN (HGB A1C): Hemoglobin A1C: 5.7 % — AB (ref 4.0–5.6)

## 2023-07-12 MED ORDER — ROSUVASTATIN CALCIUM 40 MG PO TABS: 40.0000 mg | ORAL_TABLET | Freq: Every day | ORAL | 3 refills | Status: AC

## 2023-07-12 MED ORDER — INSULIN LISPRO PROT & LISPRO (75-25 MIX) 100 UNIT/ML KWIKPEN: PEN_INJECTOR | SUBCUTANEOUS | 3 refills | Status: DC

## 2023-07-12 MED ORDER — TRESIBA FLEXTOUCH 100 UNIT/ML ~~LOC~~ SOPN: 12.0000 [IU] | PEN_INJECTOR | Freq: Every day | SUBCUTANEOUS | 3 refills | Status: AC

## 2023-07-12 MED ORDER — SEMAGLUTIDE (2 MG/DOSE) 8 MG/3ML ~~LOC~~ SOPN: 2.0000 mg | PEN_INJECTOR | SUBCUTANEOUS | 4 refills | Status: AC

## 2023-07-12 NOTE — Progress Notes (Signed)
 Outpatient Endocrinology Note [place] Tarris Delbene, MD   Patient's Name: Stephanie [name]    DOB: [birth year]    MRN: [id number]                                                    REASON OF VISIT: Follow up for type 2 diabetes mellitus  PCP: Tonna Frederic, MD  HISTORY OF PRESENT ILLNESS:   Stephanie [name] is a 68 y.o. old female with past medical history listed below, is here for follow up for type 2 diabetes mellitus.   Pertinent Diabetes History: Patient was previously seen by Dr. Hubert Madden and was last time seen in June 2024.  Patient was diagnosed with type 2 diabetes mellitus in 2003.  Patient has controlled type 2 diabetes mellitus.  Insulin  therapy was started in 2016.  Chronic Diabetes Complications : Retinopathy: yes /proliferative retinopathy. Last ophthalmology exam was done on ? annually, following with ophthalmology regularly.  Nephropathy: Microalbuminuria present, CKD on ACE/ARB /valsartan , following with nephrology Peripheral neuropathy: no Coronary artery disease: no Stroke: no  Relevant comorbidities and cardiovascular risk factors: Obesity: yes Body mass index is 37.16 kg/m.  Hypertension: Yes  Hyperlipidemia : Yes, on statin   Current / Home Diabetic regimen includes:  Humalog  mix 70/30,  30 units before supper.  No longer taking morning premixed insulin . Tresiba  14 units daily.  Ozempic  1 mg weekly.  Prior diabetic medications: Metformin , glimepiride , Kombiglyze Januvia, Onglyza apparently caused headaches, Invokana : Candidiasis .  Trulicity  in the past.  Janumet in the past.  Amaryl  caused weight gain in the past.  Trulicity  was stopped due to nausea.  Glycemic data:   She has Accu-Chek glucometer , download from June 4 to August 11, 2023, average blood sugar 111.  Mostly taking the morning fasting.  Showed fasting blood sugar 139, 126, 118, 3, 121, 118.  Blood sugar in the early evening around 90, 78, 62.    Hypoglycemia: Patient has one hypoglycemic  episode. Patient has hypoglycemia awareness.  Factors modifying glucose control: 1.  Diabetic diet assessment: 2 meals a day.   2.  Staying active or exercising: No formal exercise.  3.  Medication compliance: compliant all of the time.  Interval history  Glucometer data as reviewed above.  Mostly acceptable fasting blood sugar.  Hemoglobin A1c improved to 5.7%.  Patient denies hypoglycemic symptoms.  Diabetes regimen as reviewed and noted above.  She has been taking Ozempic  1 mg weekly and denies any GI issues.  She has plan to see ophthalmology and nephrology in the near future.  Patient lab with direct LDL 185.  No numbness and tingling of the feet.  No other complaints today.   REVIEW OF SYSTEMS As per history of present illness.   PAST MEDICAL HISTORY: Past Medical History:  Diagnosis Date   Abnormal Pap smear    Asthma    Diabetes mellitus    Hyperlipidemia    Hypertension    LGSIL (low grade squamous intraepithelial dysplasia)     PAST SURGICAL HISTORY: Past Surgical History:  Procedure Laterality Date   ABDOMINAL HYSTERECTOMY     bladder surgery     BOWEL RESECTION N/A 10/23/2020   Procedure: SMALL BOWEL RESECTION;  Surgeon: Dorrie Gaudier, Alphonso Aschoff, MD;  Location: [facility] OR;  Service: General;  Laterality: N/A;   COLPOSCOPY  INCISIONAL HERNIA REPAIR N/A 10/23/2020   Procedure: OPEN HERNIA REPAIR INCISIONAL WITH MESH;  Surgeon: Kinsinger, Alphonso Aschoff, MD;  Location: [facility] OR;  Service: General;  Laterality: N/A;   INSERTION OF MESH N/A 10/23/2020   Procedure: INSERTION OF MESH;  Surgeon: Kinsinger, Alphonso Aschoff, MD;  Location: [facility] OR;  Service: General;  Laterality: N/A;   LAPAROTOMY N/A 10/23/2020   Procedure: EXPLORATORY LAPAROTOMY;  Surgeon: Derral Flick, MD;  Location: [facility] OR;  Service: General;  Laterality: N/A;    ALLERGIES: Allergies  Allergen Reactions   Avapro  [Irbesartan ] Other (See Comments)    headaches   Codeine Nausea And Vomiting   Dulaglutide  Nausea  And Vomiting   Lisinopril  Itching    FAMILY HISTORY:  Family History  Problem Relation Age of Onset   Diabetes Mother    Diabetes Maternal Grandmother    Heart disease Neg Hx    Hypertension Neg Hx     SOCIAL HISTORY: Social History   Socioeconomic History   Marital status: Single    Spouse name: Not on file   Number of children: Not on file   Years of education: Not on file   Highest education level: Not on file  Occupational History   Not on file  Tobacco Use   Smoking status: Never   Smokeless tobacco: Never  Vaping Use   Vaping status: Never Used  Substance and Sexual Activity   Alcohol use: No   Drug use: No   Sexual activity: Yes    Birth control/protection: Surgical    Comment: hyst  Other Topics Concern   Not on file  Social History Narrative   Not on file   Social Drivers of Health   Financial Resource Strain: Low Risk  (07/01/2022)   Overall Financial Resource Strain (CARDIA)    Difficulty of Paying Living Expenses: Not hard at all  Food Insecurity: No Food Insecurity (07/01/2022)   Hunger Vital Sign    Worried About Running Out of Food in the Last Year: Never true    Ran Out of Food in the Last Year: Never true  Transportation Needs: No Transportation Needs (07/01/2022)   PRAPARE - Administrator, Civil Service (Medical): No    Lack of Transportation (Non-Medical): No  Physical Activity: Inactive (07/01/2022)   Exercise Vital Sign    Days of Exercise per Week: 0 days    Minutes of Exercise per Session: 0 min  Stress: No Stress Concern Present (07/01/2022)   [employer] of Occupational Health - Occupational Stress Questionnaire    Feeling of Stress : Not at all  Social Connections: Moderately Isolated (06/29/2021)   Social Connection and Isolation Panel    Frequency of Communication with Friends and Family: Three times a week    Frequency of Social Gatherings with Friends and Family: Three times a week    Attends Religious Services: More  than 4 times per year    Active Member of Clubs or Organizations: No    Attends Banker Meetings: Never    Marital Status: Never married    MEDICATIONS:  Current Outpatient Medications  Medication Sig Dispense Refill   Accu-Chek FastClix Lancets MISC Use accu chek fastclix lancets to check blood sugar 2-3 times daily. DX:E11.65 300 each 1   acetaminophen  (TYLENOL ) 325 MG tablet Take 2 tablets (650 mg total) by mouth every 6 (six) hours as needed for mild pain, fever or headache.     allopurinol  (ZYLOPRIM ) 100 MG tablet TAKE 1/2 (  ONE-HALF) TABLET BY MOUTH EVERY OTHER DAY (Patient taking differently: Takes Tues., Thurs. ,Saturdays) 8 tablet 5   furosemide  (LASIX ) 40 MG tablet Take 1 tablet (40 mg total) by mouth daily. (Patient taking differently: Take 40 mg by mouth daily. Takes Tues., Thurs., Sat.) 90 tablet 0   glucose blood (ACCU-CHEK GUIDE TEST) test strip Use to test blood glucose before meals and at bedtime 300 each 12   glucose blood (ACCU-CHEK GUIDE) test strip USE TO CHECK BLOOD GLUCOSE THREE TO FOUR TIMES DAILY 300 each 3   Insulin  Pen Needle (BD PEN NEEDLE NANO 2ND GEN) 32G X 4 MM MISC USE 1  THREE TIMES DAILY 100 each 3   Insulin  Pen Needle (RELION PEN NEEDLES) 31G X 6 MM MISC USE 1  THREE TIMES DAILY 200 each 0   labetalol  (NORMODYNE ) 100 MG tablet Take 1 tablet (100 mg total) by mouth 2 (two) times daily. 60 tablet 3   potassium chloride  (KLOR-CON  M) 10 MEQ tablet Take 1 tablet (10 mEq total) by mouth daily. 90 tablet 0   Semaglutide , 2 MG/DOSE, 8 MG/3ML SOPN Inject 2 mg as directed once a week. 9 mL 4   valsartan  (DIOVAN ) 160 MG tablet Take 1 tablet by mouth once daily 90 tablet 3   albuterol  (VENTOLIN  HFA) 108 (90 Base) MCG/ACT inhaler Inhale 2 puffs into the lungs every 6 (six) hours as needed for wheezing or shortness of breath. (Patient not taking: Reported on 07/12/2023) 8 g 0   amLODipine  (NORVASC ) 5 MG tablet Take 1 tablet (5 mg total) by mouth daily. (Patient  not taking: Reported on 06/05/2023) 90 tablet 1   ascorbic acid  (VITAMIN C) 500 MG tablet Take 1 tablet (500 mg total) by mouth 3 (three) times daily. (Patient not taking: Reported on 06/05/2023) 90 tablet 0   Blood Glucose Monitoring Suppl (ACCU-CHEK GUIDE ME) w/Device KIT 1 each by Does not apply route 3 (three) times daily. Use accu chek guide me to check blood sugar 2-3 times daily. DX:E11.65 1 kit 0   ferrous sulfate  325 (65 FE) MG tablet Take 1 tablet (325 mg total) by mouth 3 (three) times daily with meals. (Patient taking differently: Take 325 mg by mouth daily.) 90 tablet 0   hydrocortisone  valerate cream (WESTCORT ) 0.2 % APPLY A THIN COAT TO RASH DAILY AS NEEDED (Patient not taking: Reported on 07/12/2023) 45 g 0   Insulin  Lispro Prot & Lispro (HUMALOG  MIX 75/25 KWIKPEN) (75-25) 100 UNIT/ML Kwikpen INJECT 20 UNITS  BEFORE  DINNER 30 mL 3   rosuvastatin  (CRESTOR ) 40 MG tablet Take 1 tablet (40 mg total) by mouth daily. 90 tablet 3   TRESIBA  FLEXTOUCH 100 UNIT/ML FlexTouch Pen Inject 12 Units into the skin daily. 15 mL 3   No current facility-administered medications for this visit.    PHYSICAL EXAM: Vitals:   07/12/23 0839 07/12/23 0840  BP: (!) 142/82 (!) 146/90  Pulse: 94   Resp: 20   SpO2: 96%   Weight: 209 lb 12.8 oz (95.2 kg)   Height: 5' 3 (1.6 m)    Body mass index is 37.16 kg/m.  Wt Readings from Last 3 Encounters:  07/12/23 209 lb 12.8 oz (95.2 kg)  06/05/23 206 lb 12.8 oz (93.8 kg)  07/21/22 227 lb 9.6 oz (103.2 kg)    General: Well developed, well nourished female in no apparent distress.  HEENT: AT/[place], no external lesions.  Eyes: Conjunctiva clear and no icterus. Neurologic: Alert, oriented, normal speech Extremities : Trace  pedal edema. Psychiatric: Does not appear depressed or anxious  Diabetic Foot Exam - Simple   Simple Foot Form Diabetic Foot exam was performed with the following findings: Yes 07/12/2023  8:52 AM  Visual Inspection No deformities, no  ulcerations, no other skin breakdown bilaterally: Yes See comments: Yes Sensation Testing Intact to touch and monofilament testing bilaterally: Yes Pulse Check Posterior Tibialis and Dorsalis pulse intact bilaterally: Yes Comments Dystrophic nails bilaterally.     LABS Reviewed Lab Results  Component Value Date   HGBA1C 5.7 (A) 07/12/2023   HGBA1C 6.2 (H) 01/26/2023   HGBA1C 6.1 07/19/2022   Lab Results  Component Value Date   FRUCTOSAMINE 216 11/10/2021   FRUCTOSAMINE 232 01/04/2019   FRUCTOSAMINE 288 (H) 05/21/2018   Lab Results  Component Value Date   CHOL 195 01/26/2023   HDL 52 01/26/2023   LDLCALC 123 (H) 01/26/2023   LDLDIRECT 185.0 06/05/2023   TRIG 92 01/26/2023   CHOLHDL 3.8 01/26/2023   Lab Results  Component Value Date   MICRALBCREAT 72.8 (H) 03/16/2022   MICRALBCREAT 1,125 12/22/2021   Lab Results  Component Value Date   CREATININE 1.86 (H) 06/05/2023   Lab Results  Component Value Date   GFR 27.27 (L) 06/05/2023    ASSESSMENT / PLAN  1. Controlled type 2 diabetes mellitus with complication, with long-term current use of insulin  (HCC)   2. Mixed hyperlipidemia     Diabetes Mellitus type 2, complicated by diabetic retinopathy and CKD. - Diabetic status / severity: Controlled.  Lab Results  Component Value Date   HGBA1C 5.7 (A) 07/12/2023    - Hemoglobin A1c goal : <6.5%  Patient has mostly acceptable blood sugar with occasional low normal blood sugar and 1 episode of low blood sugar.    Adjusted diabetes regimen as follows.  Will maximize Ozempic  and gradually decrease insulin .  - Medications: See below.  I) decrease Tresiba  14 units in the evening to 12 units. II) decrease Humalog  mix 70/30 from 30 to 20 units with supper. III) increase Ozempic  1 mg weekly to 2 mg.  - Home glucose testing: Check in the morning fasting and at bedtime and sometime in the afternoon. - Discussed/ Gave Hypoglycemia treatment plan.  # Consult : not  required at this time.   # Annual urine for microalbuminuria/ creatinine ratio, + microalbuminuria currently, continue ACE/ARB /valsartan .  Has CKD following with nephrology.  She reports she has follow-up in July with nephrology. Last  Lab Results  Component Value Date   MICRALBCREAT 72.8 (H) 03/16/2022    # Foot check nightly.  # She has diabetic retinopathy, following regularly with ophthalmology.   - Diet: Make healthy diabetic food choices - Life style / activity / exercise: Discussed.  2. Blood pressure  -  BP Readings from Last 1 Encounters:  07/12/23 (!) 146/90    - Control is in target.  - No change in current plans.  3. Lipid status / Hyperlipidemia - Last  Lab Results  Component Value Date   LDLCALC 123 (H) 01/26/2023   -Recent direct LDL 185. -Increase rosuvastatin  from 20 to 40 mg daily.  Diagnoses and all orders for this visit:  Controlled type 2 diabetes mellitus with complication, with long-term current use of insulin  (HCC) -     POCT glycosylated hemoglobin (Hb A1C) -     Semaglutide , 2 MG/DOSE, 8 MG/3ML SOPN; Inject 2 mg as directed once a week. -     Insulin  Lispro Prot & Lispro (HUMALOG   MIX 75/25 KWIKPEN) (75-25) 100 UNIT/ML Kwikpen; INJECT 20 UNITS  BEFORE  DINNER -     TRESIBA  FLEXTOUCH 100 UNIT/ML FlexTouch Pen; Inject 12 Units into the skin daily.  Mixed hyperlipidemia -     rosuvastatin  (CRESTOR ) 40 MG tablet; Take 1 tablet (40 mg total) by mouth daily.    DISPOSITION Follow up in clinic in 4  months suggested.   All questions answered and patient verbalized understanding of the plan.  [place] Cerria Randhawa, MD [facility] [address] [place], [address] [place], [place] [address] Phone # [phone]  At least part of this note was generated using voice recognition software. Inadvertent word errors may have occurred, which were not recognized during the proofreading process.

## 2023-07-12 NOTE — Patient Instructions (Addendum)
 Latest Reference Range & Units 03/16/22 10:05 07/19/22 14:58 01/26/23 11:52 07/12/23 08:42  Hemoglobin A1C 4.0 - 5.6 % 6.3 6.1 6.2 (H) 5.7 !  (H): Data is abnormally high !: Data is abnormal   Increase Ozempic  to 2 mg weekly. Decrease tresiba  to 12 units daily and Humalog  mix to 20 mg with supper.

## 2023-07-15 ENCOUNTER — Other Ambulatory Visit: Payer: Self-pay | Admitting: Family Medicine

## 2023-07-21 ENCOUNTER — Other Ambulatory Visit: Payer: Self-pay | Admitting: Family Medicine

## 2023-07-21 DIAGNOSIS — Z8739 Personal history of other diseases of the musculoskeletal system and connective tissue: Secondary | ICD-10-CM

## 2023-07-24 MED ORDER — ALLOPURINOL 100 MG PO TABS
100.0000 mg | ORAL_TABLET | Freq: Every day | ORAL | 0 refills | Status: DC
Start: 2023-07-24 — End: 2023-12-06

## 2023-07-24 NOTE — Addendum Note (Signed)
 Addended by: BERNETA ELSIE LABOR on: 07/24/2023 01:14 PM   Modules accepted: Orders

## 2023-07-27 ENCOUNTER — Other Ambulatory Visit: Payer: Self-pay | Admitting: Family Medicine

## 2023-07-27 DIAGNOSIS — L309 Dermatitis, unspecified: Secondary | ICD-10-CM

## 2023-08-09 ENCOUNTER — Telehealth: Payer: Self-pay | Admitting: Family Medicine

## 2023-08-09 NOTE — Telephone Encounter (Signed)
 ERROR

## 2023-08-11 ENCOUNTER — Other Ambulatory Visit: Payer: Self-pay | Admitting: Obstetrics and Gynecology

## 2023-08-11 DIAGNOSIS — Z1231 Encounter for screening mammogram for malignant neoplasm of breast: Secondary | ICD-10-CM

## 2023-08-17 DIAGNOSIS — N1832 Chronic kidney disease, stage 3b: Secondary | ICD-10-CM | POA: Diagnosis not present

## 2023-08-17 DIAGNOSIS — E785 Hyperlipidemia, unspecified: Secondary | ICD-10-CM | POA: Diagnosis not present

## 2023-08-17 DIAGNOSIS — D631 Anemia in chronic kidney disease: Secondary | ICD-10-CM | POA: Diagnosis not present

## 2023-08-17 DIAGNOSIS — N189 Chronic kidney disease, unspecified: Secondary | ICD-10-CM | POA: Diagnosis not present

## 2023-08-17 DIAGNOSIS — N2581 Secondary hyperparathyroidism of renal origin: Secondary | ICD-10-CM | POA: Diagnosis not present

## 2023-08-17 DIAGNOSIS — E1122 Type 2 diabetes mellitus with diabetic chronic kidney disease: Secondary | ICD-10-CM | POA: Diagnosis not present

## 2023-08-17 DIAGNOSIS — Z794 Long term (current) use of insulin: Secondary | ICD-10-CM | POA: Diagnosis not present

## 2023-08-17 DIAGNOSIS — I129 Hypertensive chronic kidney disease with stage 1 through stage 4 chronic kidney disease, or unspecified chronic kidney disease: Secondary | ICD-10-CM | POA: Diagnosis not present

## 2023-08-30 ENCOUNTER — Ambulatory Visit
Admission: RE | Admit: 2023-08-30 | Discharge: 2023-08-30 | Disposition: A | Discharge status: Home / Self Care | Source: Ambulatory Visit | Attending: Obstetrics and Gynecology | Admitting: Obstetrics and Gynecology

## 2023-08-30 DIAGNOSIS — Z1231 Encounter for screening mammogram for malignant neoplasm of breast: Secondary | ICD-10-CM

## 2023-10-27 ENCOUNTER — Telehealth: Payer: Self-pay | Admitting: Family Medicine

## 2023-10-27 NOTE — Telephone Encounter (Signed)
 Copied from CRM #[id number]. Topic: Appointments - Transfer of Care >> Oct 27, 2023  2:30 PM Franky GRADE wrote: Pt is requesting to transfer FROM: Dr.Kremer Pt is requesting to transfer TO: Dr.Thompson Reason for requested transfer: Patient stated she wanted to change primary care providers It is the responsibility of the team the patient would like to transfer to (Dr. Sebastian.) to reach out to the patient if for any reason this transfer is not acceptable.

## 2023-10-27 NOTE — Telephone Encounter (Signed)
 E2C2 scheduled a TOC from Dr Berneta to Dr Sebastian. I spoke with pt, she needs more availability for appt's and she feels he can't do any more for her. Can she have her TOC?

## 2023-10-30 NOTE — Telephone Encounter (Signed)
 That is fine with me.

## 2023-11-14 ENCOUNTER — Telehealth: Payer: Self-pay

## 2023-11-14 NOTE — Telephone Encounter (Signed)
 Patient called with a scheduling conflict. Patient called and transferred to front desk to assist

## 2023-11-16 ENCOUNTER — Ambulatory Visit: Admitting: Endocrinology

## 2023-11-16 DIAGNOSIS — Z1211 Encounter for screening for malignant neoplasm of colon: Secondary | ICD-10-CM | POA: Diagnosis not present

## 2023-11-17 ENCOUNTER — Encounter

## 2023-11-20 ENCOUNTER — Encounter: Payer: Self-pay | Admitting: Endocrinology

## 2023-11-20 ENCOUNTER — Ambulatory Visit (INDEPENDENT_AMBULATORY_CARE_PROVIDER_SITE_OTHER): Admitting: Endocrinology

## 2023-11-20 ENCOUNTER — Ambulatory Visit: Payer: Self-pay | Admitting: Endocrinology

## 2023-11-20 ENCOUNTER — Other Ambulatory Visit

## 2023-11-20 VITALS — BP 128/76 | HR 95 | Ht 63.0 in | Wt 218.0 lb

## 2023-11-20 DIAGNOSIS — Z794 Long term (current) use of insulin: Secondary | ICD-10-CM | POA: Diagnosis not present

## 2023-11-20 DIAGNOSIS — E782 Mixed hyperlipidemia: Secondary | ICD-10-CM

## 2023-11-20 DIAGNOSIS — E118 Type 2 diabetes mellitus with unspecified complications: Secondary | ICD-10-CM

## 2023-11-20 LAB — POCT GLYCOSYLATED HEMOGLOBIN (HGB A1C): Hemoglobin A1C: 5.9 % — AB (ref 4.0–5.6)

## 2023-11-20 MED ORDER — INSULIN LISPRO PROT & LISPRO (75-25 MIX) 100 UNIT/ML KWIKPEN: PEN_INJECTOR | SUBCUTANEOUS | 3 refills | Status: AC

## 2023-11-20 NOTE — Progress Notes (Signed)
 Outpatient Endocrinology Note Stephanie Gullickson, MD   Patient's Name: Stephanie [name]    DOB: 1955-03-26    MRN: [id number]                                                    REASON OF VISIT: Follow up for type 2 diabetes mellitus  PCP: Berneta Elsie Sayre, MD  HISTORY OF PRESENT ILLNESS:   Stephanie [name] is a 68 y.o. old female with past medical history listed below, is here for follow up for type 2 diabetes mellitus.   Pertinent Diabetes History: Patient was previously seen by Dr. Von and was last time seen in June 2024.  Patient was diagnosed with type 2 diabetes mellitus in 2003.  Patient has controlled type 2 diabetes mellitus.  Insulin  therapy was started in 2016.  Chronic Diabetes Complications : Retinopathy: yes / proliferative retinopathy. Last ophthalmology exam was done on ? annually, following with ophthalmology regularly.  Nephropathy: Microalbuminuria present, CKD on ACE/ARB /valsartan , following with nephrology Peripheral neuropathy: no Coronary artery disease: no Stroke: no  Relevant comorbidities and cardiovascular risk factors: Obesity: yes Body mass index is 38.62 kg/m.  Hypertension: Yes  Hyperlipidemia : Yes, on statin   Current / Home Diabetic regimen includes:  Humalog  mix 70/30 insulin  24 units before supper.  No longer taking morning premixed insulin . Tresiba  12 units daily. Ozempic  2 mg weekly.  Prior diabetic medications: Metformin , glimepiride , Kombiglyze Januvia, Onglyza apparently caused headaches, Invokana : Candidiasis .  Trulicity  in the past.  Janumet in the past.  Amaryl  caused weight gain in the past.  Trulicity  was stopped due to nausea.  Glycemic data:   She has Accu-Chek glucometer, download from J October 13 to November 20, 2023, average blood sugar 126.  Mostly taking the morning fasting and sometime in the evening.  Fasting blood sugar 143, 154, 104, 115, 127, 101.  Blood sugar in the evening 164, 87, 125, 135.  Hypoglycemia: Patient  has no hypoglycemic episode. Patient has hypoglycemia awareness.  Factors modifying glucose control: 1.  Diabetic diet assessment: 2 meals a day.   2.  Staying active or exercising: No formal exercise.  3.  Medication compliance: compliant all of the time.  Interval history  Glucometer data as reviewed above.  Hemoglobin A1c 5.9%.  Diabetes regimen is as noted and reviewed above.  She has been tolerating Ozempic  well, currently taking 2 mg weekly.  Denies any hypoglycemic symptoms.  She reports she has diabetic eye exam in coming December.  She has no other complaints today.   REVIEW OF SYSTEMS As per history of present illness.   PAST MEDICAL HISTORY: Past Medical History:  Diagnosis Date   Abnormal Pap smear    Asthma    Diabetes mellitus    Hyperlipidemia    Hypertension    LGSIL (low grade squamous intraepithelial dysplasia)     PAST SURGICAL HISTORY: Past Surgical History:  Procedure Laterality Date   ABDOMINAL HYSTERECTOMY     bladder surgery     BOWEL RESECTION N/A 10/23/2020   Procedure: SMALL BOWEL RESECTION;  Surgeon: Stevie, Herlene Righter, MD;  Location: [facility] OR;  Service: General;  Laterality: N/A;   COLPOSCOPY     INCISIONAL HERNIA REPAIR N/A 10/23/2020   Procedure: OPEN HERNIA REPAIR INCISIONAL WITH MESH;  Surgeon: Stevie Herlene Righter, MD;  Location:  [facility] OR;  Service: General;  Laterality: N/A;   INSERTION OF MESH N/A 10/23/2020   Procedure: INSERTION OF MESH;  Surgeon: Kinsinger, Herlene Righter, MD;  Location: [facility] OR;  Service: General;  Laterality: N/A;   LAPAROTOMY N/A 10/23/2020   Procedure: EXPLORATORY LAPAROTOMY;  Surgeon: Stevie Herlene Righter, MD;  Location: [facility] OR;  Service: General;  Laterality: N/A;    ALLERGIES: Allergies  Allergen Reactions   Avapro  [Irbesartan ] Other (See Comments)    headaches   Codeine Nausea And Vomiting   Dulaglutide  Nausea And Vomiting   Lisinopril  Itching    FAMILY HISTORY:  Family History  Problem Relation Age of Onset    Diabetes Mother    Diabetes Maternal Grandmother    Heart disease Neg Hx    Hypertension Neg Hx     SOCIAL HISTORY: Social History   Socioeconomic History   Marital status: Single    Spouse name: Not on file   Number of children: Not on file   Years of education: Not on file   Highest education level: Not on file  Occupational History   Not on file  Tobacco Use   Smoking status: Never   Smokeless tobacco: Never  Vaping Use   Vaping status: Never Used  Substance and Sexual Activity   Alcohol use: No   Drug use: No   Sexual activity: Yes    Birth control/protection: Surgical    Comment: hyst  Other Topics Concern   Not on file  Social History Narrative   Not on file   Social Drivers of Health   Financial Resource Strain: Low Risk  (07/01/2022)   Overall Financial Resource Strain (CARDIA)    Difficulty of Paying Living Expenses: Not hard at all  Food Insecurity: No Food Insecurity (07/01/2022)   Hunger Vital Sign    Worried About Running Out of Food in the Last Year: Never true    Ran Out of Food in the Last Year: Never true  Transportation Needs: No Transportation Needs (07/01/2022)   PRAPARE - Administrator, Civil Service (Medical): No    Lack of Transportation (Non-Medical): No  Physical Activity: Inactive (07/01/2022)   Exercise Vital Sign    Days of Exercise per Week: 0 days    Minutes of Exercise per Session: 0 min  Stress: No Stress Concern Present (07/01/2022)   [employer] of Occupational Health - Occupational Stress Questionnaire    Feeling of Stress : Not at all  Social Connections: Moderately Isolated (06/29/2021)   Social Connection and Isolation Panel    Frequency of Communication with Friends and Family: Three times a week    Frequency of Social Gatherings with Friends and Family: Three times a week    Attends Religious Services: More than 4 times per year    Active Member of Clubs or Organizations: No    Attends Banker  Meetings: Never    Marital Status: Never married    MEDICATIONS:  Current Outpatient Medications  Medication Sig Dispense Refill   Accu-Chek FastClix Lancets MISC Use accu chek fastclix lancets to check blood sugar 2-3 times daily. DX:E11.65 300 each 1   acetaminophen  (TYLENOL ) 325 MG tablet Take 2 tablets (650 mg total) by mouth every 6 (six) hours as needed for mild pain, fever or headache.     allopurinol  (ZYLOPRIM ) 100 MG tablet Take 1 tablet (100 mg total) by mouth daily. 90 tablet 0   Blood Glucose Monitoring Suppl (ACCU-CHEK GUIDE ME) w/Device KIT 1  each by Does not apply route 3 (three) times daily. Use accu chek guide me to check blood sugar 2-3 times daily. DX:E11.65 1 kit 0   ferrous sulfate  325 (65 FE) MG tablet Take 1 tablet (325 mg total) by mouth 3 (three) times daily with meals. (Patient taking differently: Take 325 mg by mouth daily.) 90 tablet 0   furosemide  (LASIX ) 40 MG tablet Take 1 tablet (40 mg total) by mouth daily. Takes Tues., Thurs., Sat. 90 tablet 5   glucose blood (ACCU-CHEK GUIDE TEST) test strip Use to test blood glucose before meals and at bedtime 300 each 12   glucose blood (ACCU-CHEK GUIDE) test strip USE TO CHECK BLOOD GLUCOSE THREE TO FOUR TIMES DAILY 300 each 3   hydrocortisone  valerate cream (WESTCORT ) 0.2 % APPLY A THIN COAT TO RASH DAILY AS NEEDED 45 g 0   Insulin  Pen Needle (BD PEN NEEDLE NANO 2ND GEN) 32G X 4 MM MISC USE 1  THREE TIMES DAILY 100 each 3   Insulin  Pen Needle (RELION PEN NEEDLES) 31G X 6 MM MISC USE 1  THREE TIMES DAILY 200 each 0   labetalol  (NORMODYNE ) 100 MG tablet Take 1 tablet (100 mg total) by mouth 2 (two) times daily. 60 tablet 3   potassium chloride  (KLOR-CON  M) 10 MEQ tablet Take 1 tablet (10 mEq total) by mouth daily. 90 tablet 0   rosuvastatin  (CRESTOR ) 40 MG tablet Take 1 tablet (40 mg total) by mouth daily. 90 tablet 3   Semaglutide , 2 MG/DOSE, 8 MG/3ML SOPN Inject 2 mg as directed once a week. 9 mL 4   TRESIBA  FLEXTOUCH 100  UNIT/ML FlexTouch Pen Inject 12 Units into the skin daily. 15 mL 3   valsartan  (DIOVAN ) 160 MG tablet Take 1 tablet by mouth once daily 90 tablet 3   albuterol  (VENTOLIN  HFA) 108 (90 Base) MCG/ACT inhaler Inhale 2 puffs into the lungs every 6 (six) hours as needed for wheezing or shortness of breath. (Patient not taking: Reported on 11/20/2023) 8 g 0   amLODipine  (NORVASC ) 5 MG tablet Take 1 tablet (5 mg total) by mouth daily. (Patient not taking: Reported on 11/20/2023) 90 tablet 1   ascorbic acid  (VITAMIN C) 500 MG tablet Take 1 tablet (500 mg total) by mouth 3 (three) times daily. (Patient not taking: Reported on 11/20/2023) 90 tablet 0   Insulin  Lispro Prot & Lispro (HUMALOG  MIX 75/25 KWIKPEN) (75-25) 100 UNIT/ML Kwikpen INJECT 24 UNITS  BEFORE  DINNER 30 mL 3   No current facility-administered medications for this visit.    PHYSICAL EXAM: Vitals:   11/20/23 1047  BP: 128/76  Pulse: 95  SpO2: 99%  Weight: 218 lb (98.9 kg)  Height: 5' 3 (1.6 m)    Body mass index is 38.62 kg/m.  Wt Readings from Last 3 Encounters:  11/20/23 218 lb (98.9 kg)  07/12/23 209 lb 12.8 oz (95.2 kg)  06/05/23 206 lb 12.8 oz (93.8 kg)    General: Well developed, well nourished female in no apparent distress.  HEENT: AT/[place], no external lesions.  Eyes: Conjunctiva clear and no icterus. Neurologic: Alert, oriented, normal speech Extremities : No pedal edema. Psychiatric: Does not appear depressed or anxious  Diabetic Foot Exam - Simple   No data filed    LABS Reviewed Lab Results  Component Value Date   HGBA1C 5.9 (A) 11/20/2023   HGBA1C 5.7 (A) 07/12/2023   HGBA1C 6.2 (H) 01/26/2023   Lab Results  Component Value Date   FRUCTOSAMINE  216 11/10/2021   FRUCTOSAMINE 232 01/04/2019   FRUCTOSAMINE 288 (H) 05/21/2018   Lab Results  Component Value Date   CHOL 195 01/26/2023   HDL 52 01/26/2023   LDLCALC 123 (H) 01/26/2023   LDLDIRECT 185.0 06/05/2023   TRIG 92 01/26/2023   CHOLHDL 3.8  01/26/2023   Lab Results  Component Value Date   MICRALBCREAT 1,125 12/22/2021   Lab Results  Component Value Date   CREATININE 1.86 (H) 06/05/2023   Lab Results  Component Value Date   GFR 27.27 (L) 06/05/2023    ASSESSMENT / PLAN  1. Controlled type 2 diabetes mellitus with complication, with long-term current use of insulin  (HCC)   2. Mixed hyperlipidemia    Diabetes Mellitus type 2, complicated by diabetic retinopathy and CKD. - Diabetic status / severity: controlled.  Lab Results  Component Value Date   HGBA1C 5.9 (A) 11/20/2023    - Hemoglobin A1c goal : < 6.5%  - Medications: See below.  No change.  I) continue Tresiba  12 units daily. II) continue Humalog  mix 70/30 insulin  24 units with supper. III) continue Ozempic  2 mg weekly.  - Home glucose testing: Check in the morning fasting and at bedtime and sometime in the afternoon. - Discussed/ Gave Hypoglycemia treatment plan.  # Consult : not required at this time.   # Annual urine for microalbuminuria/ creatinine ratio, + microalbuminuria currently, continue ACE/ARB /valsartan .  Has CKD following with nephrology.  She reports she has follow-up in July with nephrology.  Will check urine microalbumin creatinine ratio today. Last  Lab Results  Component Value Date   MICRALBCREAT 1,125 12/22/2021    # Foot check nightly.  # She has diabetic retinopathy, following regularly with ophthalmology.   - Diet: Make healthy diabetic food choices - Life style / activity / exercise: Discussed.  2. Blood pressure  -  BP Readings from Last 1 Encounters:  11/20/23 128/76    - Control is in target.  - No change in current plans.  3. Lipid status / Hyperlipidemia - Last  Lab Results  Component Value Date   LDLCALC 123 (H) 01/26/2023   -Recent direct LDL 185. - On rosuvastatin  40 mg daily. - Check lipid panel today.  [place] was seen today for follow-up.  Diagnoses and all orders for this visit:  Controlled  type 2 diabetes mellitus with complication, with long-term current use of insulin  (HCC) -     POCT glycosylated hemoglobin (Hb A1C) -     Microalbumin / creatinine urine ratio -     Insulin  Lispro Prot & Lispro (HUMALOG  MIX 75/25 KWIKPEN) (75-25) 100 UNIT/ML Kwikpen; INJECT 24 UNITS  BEFORE  DINNER  Mixed hyperlipidemia -     Lipid panel   DISPOSITION Follow up in clinic in 4  months suggested.   All questions answered and patient verbalized understanding of the plan.  Star Cheese, MD [facility] Endocrinology [facility] [address] [place], [address] [place], [place] [address] Phone # [phone]  At least part of this note was generated using voice recognition software. Inadvertent word errors may have occurred, which were not recognized during the proofreading process.

## 2023-11-21 LAB — LIPID PANEL
Cholesterol: 168 mg/dL (ref <200)
HDL: 59 mg/dL (ref >=50)
LDL Cholesterol (Calc): 90 mg/dL
Non-HDL Cholesterol (Calc): 109 mg/dL (ref <130)
Total CHOL/HDL Ratio: 2.8 (calc) (ref <5.0)
Triglycerides: 101 mg/dL (ref <150)

## 2023-11-21 LAB — MICROALBUMIN / CREATININE URINE RATIO
Creatinine, Urine: 70 mg/dL (ref 20–275)
Microalb Creat Ratio: 1066 mg/g{creat} — ABNORMAL HIGH (ref <30)
Microalb, Ur: 74.6 mg/dL

## 2023-11-28 IMAGING — MG MM DIGITAL SCREENING BILAT W/ TOMO AND CAD
6 of 12 series · 6 of 36 positions shown · non-contrast
Comparison: Previous exam(s).

CLINICAL DATA: Screening.

EXAM:
DIGITAL SCREENING BILATERAL MAMMOGRAM WITH TOMOSYNTHESIS AND CAD
TECHNIQUE: Bilateral screening digital craniocaudal and mediolateral oblique
mammograms were obtained. Bilateral screening digital breast
tomosynthesis was performed. The images were evaluated with
computer-aided detection.

[L CC synth-2D (1 of 2)]
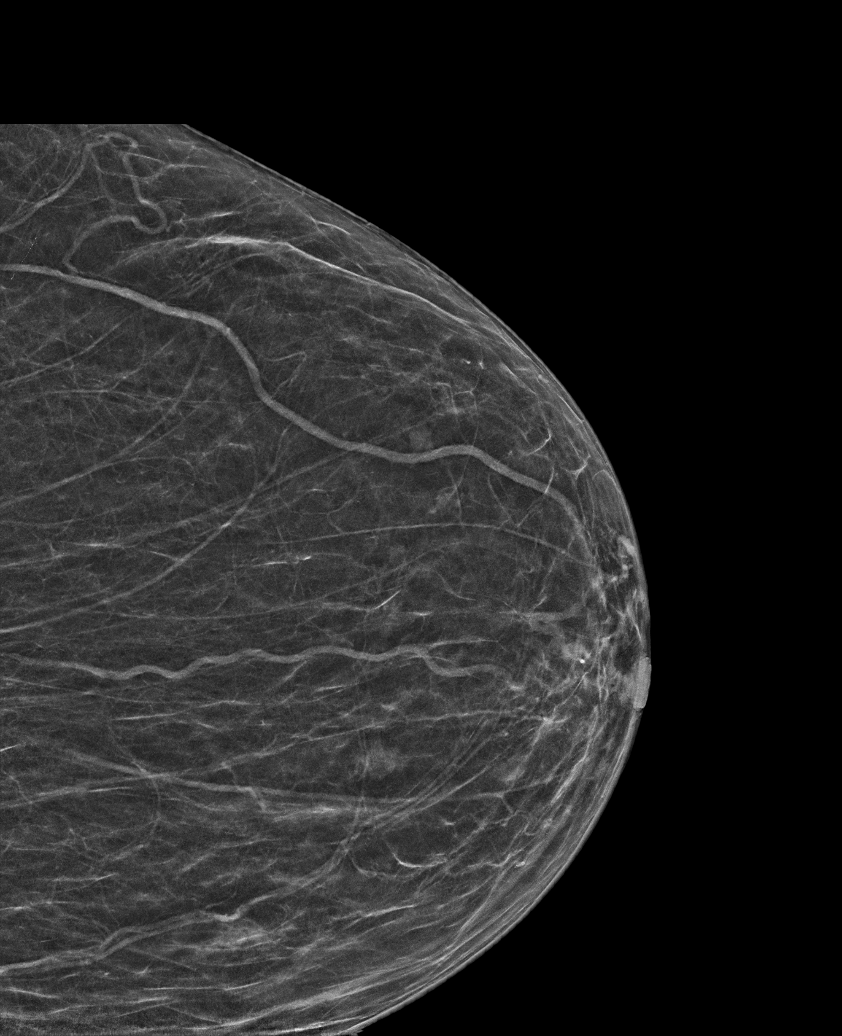

[R CC synth-2D (1 of 2)]
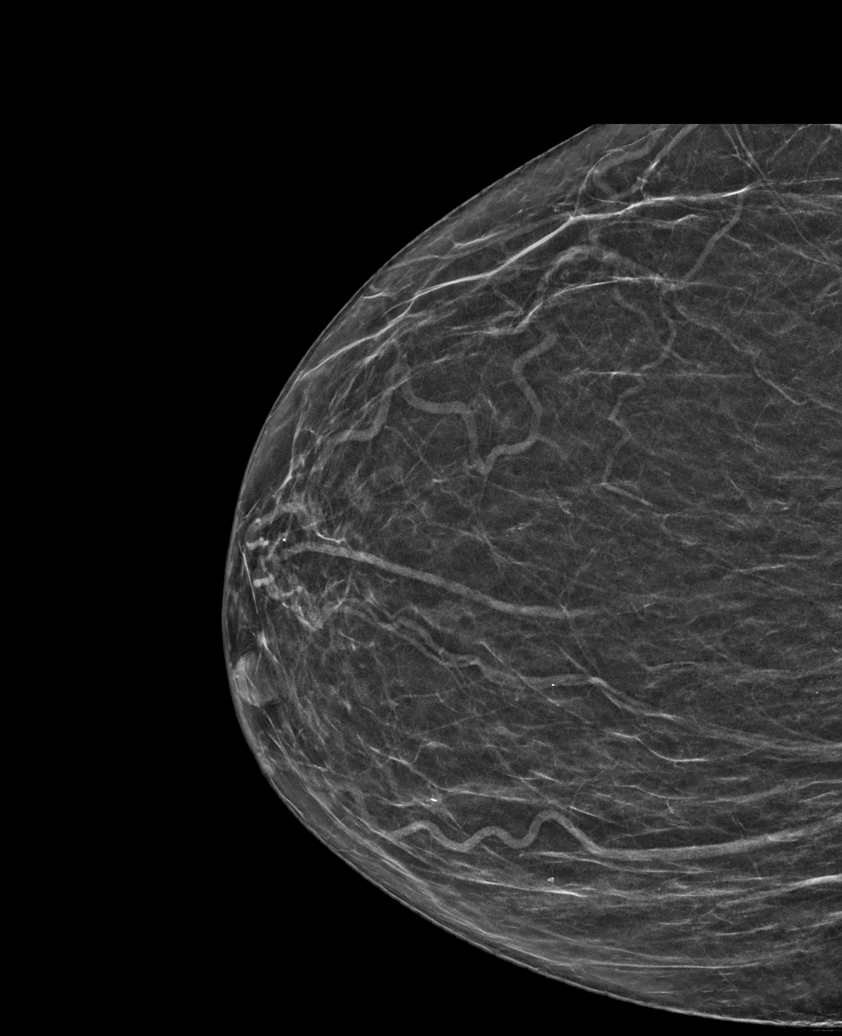

[L CC synth-2D (2 of 2)]
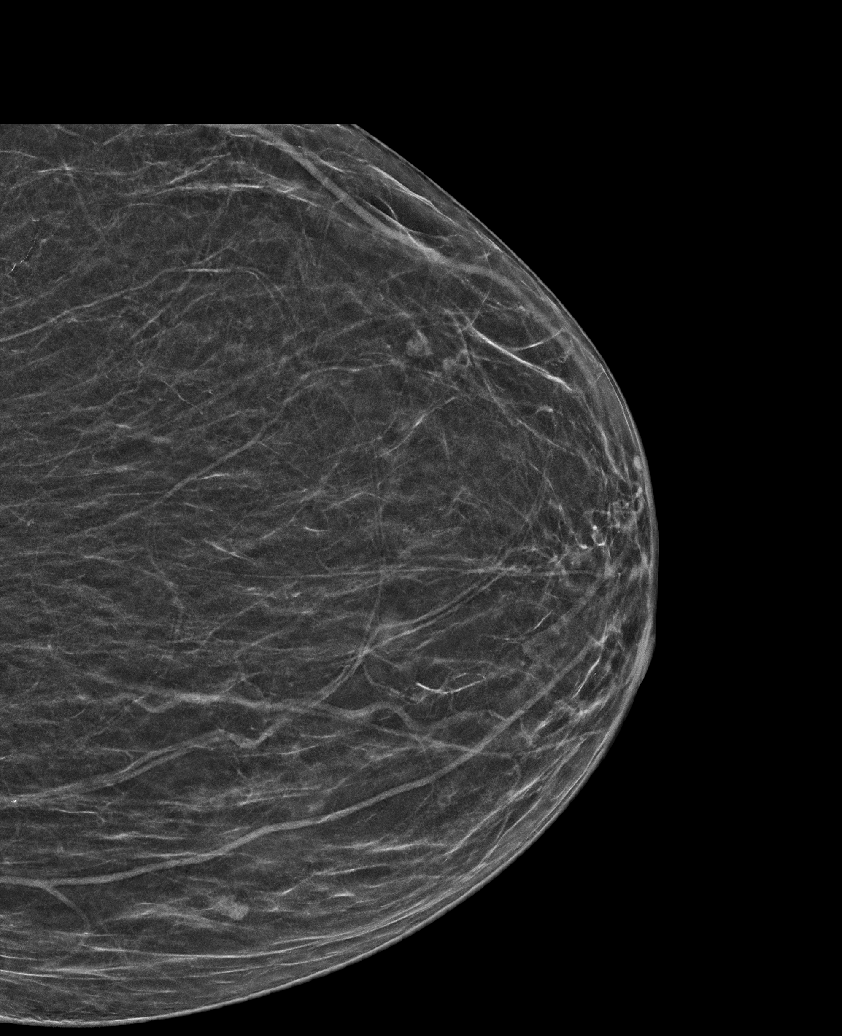

[L MLO synth-2D]
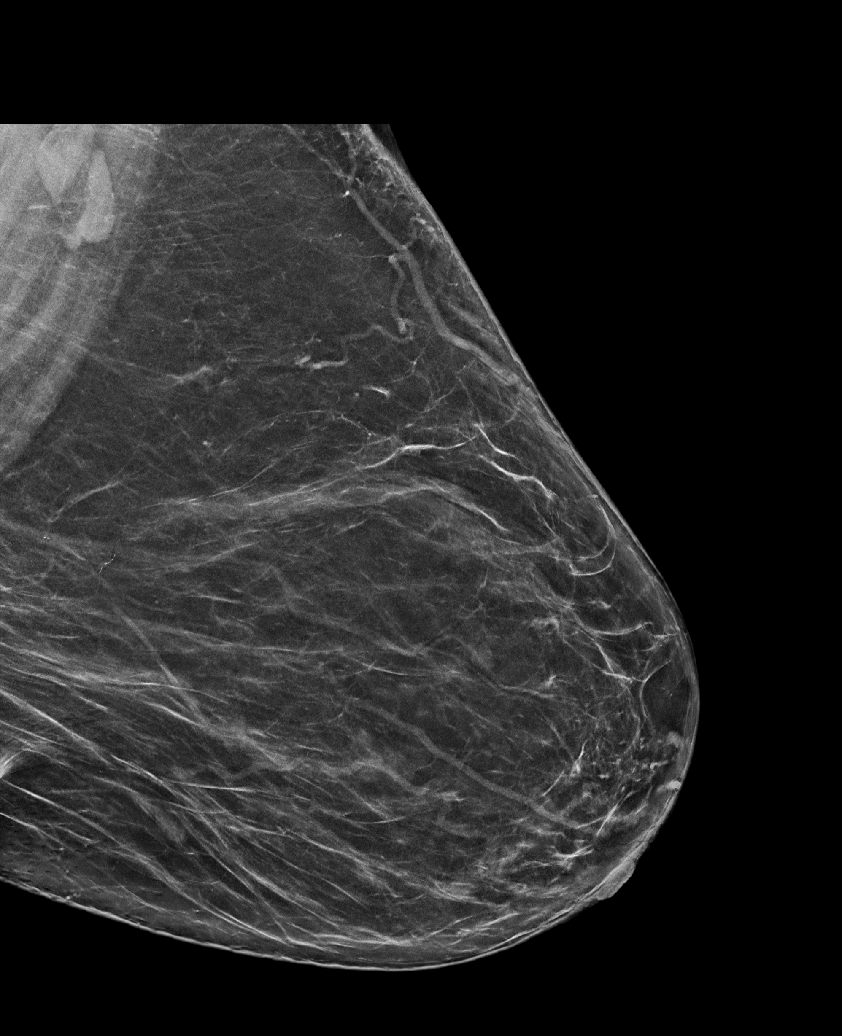

[R MLO synth-2D]
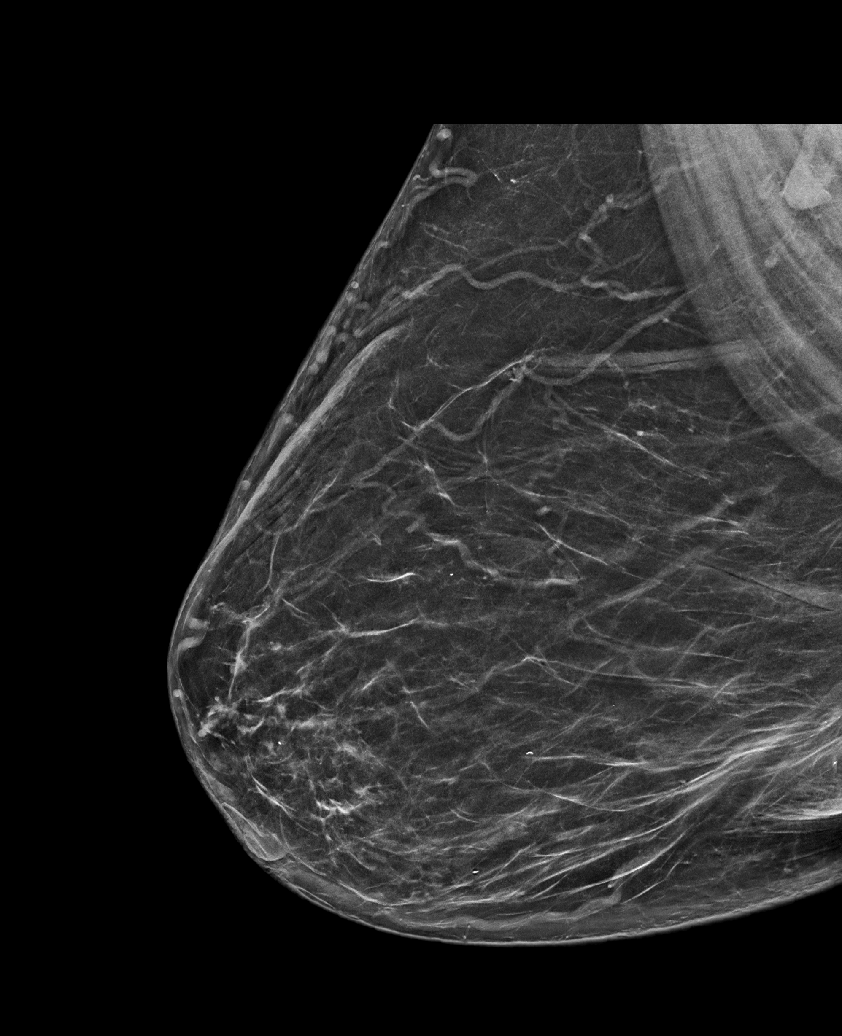

[R CC synth-2D (2 of 2)]
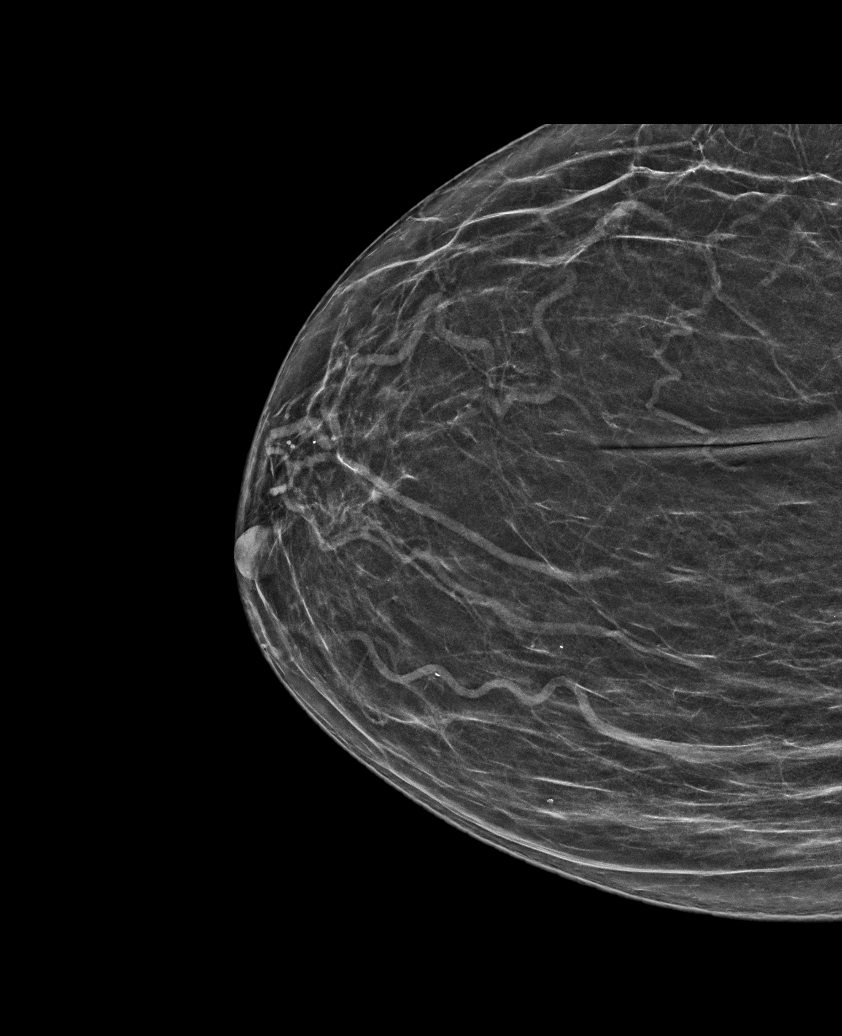

[6 of 36 positions shown; findings below may reference images not displayed]

ACR Breast Density Category b: There are scattered areas of
fibroglandular density.
FINDINGS: There are no findings suspicious for malignancy.
IMPRESSION: No mammographic evidence of malignancy. A result letter of this
screening mammogram will be mailed directly to the patient.

RECOMMENDATION:
Screening mammogram in one year. (Code:51-O-LD2)

BI-RADS CATEGORY  1: Negative.

## 2023-12-05 ENCOUNTER — Ambulatory Visit: Admitting: Family Medicine

## 2023-12-06 ENCOUNTER — Encounter: Payer: Self-pay | Admitting: Family Medicine

## 2023-12-06 ENCOUNTER — Ambulatory Visit (INDEPENDENT_AMBULATORY_CARE_PROVIDER_SITE_OTHER): Admitting: Family Medicine

## 2023-12-06 VITALS — BP 141/89 | HR 82 | Temp 97.3°F | Resp 18 | Wt 221.2 lb

## 2023-12-06 DIAGNOSIS — Z7689 Persons encountering health services in other specified circumstances: Secondary | ICD-10-CM

## 2023-12-06 DIAGNOSIS — Z794 Long term (current) use of insulin: Secondary | ICD-10-CM

## 2023-12-06 DIAGNOSIS — E785 Hyperlipidemia, unspecified: Secondary | ICD-10-CM

## 2023-12-06 DIAGNOSIS — D631 Anemia in chronic kidney disease: Secondary | ICD-10-CM | POA: Diagnosis not present

## 2023-12-06 DIAGNOSIS — E1159 Type 2 diabetes mellitus with other circulatory complications: Secondary | ICD-10-CM

## 2023-12-06 DIAGNOSIS — E1169 Type 2 diabetes mellitus with other specified complication: Secondary | ICD-10-CM | POA: Diagnosis not present

## 2023-12-06 DIAGNOSIS — F5101 Primary insomnia: Secondary | ICD-10-CM

## 2023-12-06 DIAGNOSIS — N184 Chronic kidney disease, stage 4 (severe): Secondary | ICD-10-CM

## 2023-12-06 DIAGNOSIS — G8929 Other chronic pain: Secondary | ICD-10-CM

## 2023-12-06 DIAGNOSIS — E1122 Type 2 diabetes mellitus with diabetic chronic kidney disease: Secondary | ICD-10-CM | POA: Diagnosis not present

## 2023-12-06 DIAGNOSIS — I152 Hypertension secondary to endocrine disorders: Secondary | ICD-10-CM

## 2023-12-06 DIAGNOSIS — Z8739 Personal history of other diseases of the musculoskeletal system and connective tissue: Secondary | ICD-10-CM

## 2023-12-06 DIAGNOSIS — R0789 Other chest pain: Secondary | ICD-10-CM

## 2023-12-06 DIAGNOSIS — Z1211 Encounter for screening for malignant neoplasm of colon: Secondary | ICD-10-CM

## 2023-12-06 LAB — COMPREHENSIVE METABOLIC PANEL WITH GFR
ALT: 16 U/L (ref 0–35)
AST: 18 U/L (ref 0–37)
Albumin: 4.2 g/dL (ref 3.5–5.2)
Alkaline Phosphatase: 49 U/L (ref 39–117)
BUN: 32 mg/dL — ABNORMAL HIGH (ref 6–23)
CO2: 27 meq/L (ref 19–32)
Calcium: 9.6 mg/dL (ref 8.4–10.5)
Chloride: 105 meq/L (ref 96–112)
Creatinine, Ser: 1.71 mg/dL — ABNORMAL HIGH (ref 0.40–1.20)
GFR: 30.06 mL/min — ABNORMAL LOW (ref >60.00)
Glucose, Bld: 93 mg/dL (ref 70–99)
Potassium: 4.3 meq/L (ref 3.5–5.1)
Sodium: 140 meq/L (ref 135–145)
Total Bilirubin: 0.7 mg/dL (ref 0.2–1.2)
Total Protein: 7.9 g/dL (ref 6.0–8.3)

## 2023-12-06 LAB — CBC WITH DIFFERENTIAL/PLATELET
Basophils Absolute: 0.1 K/uL (ref 0.0–0.1)
Basophils Relative: 0.9 % (ref 0.0–3.0)
Eosinophils Absolute: 0.2 K/uL (ref 0.0–0.7)
Eosinophils Relative: 3.7 % (ref 0.0–5.0)
HCT: 35.8 % — ABNORMAL LOW (ref 36.0–46.0)
Hemoglobin: 12 g/dL (ref 12.0–15.0)
Lymphocytes Relative: 36 % (ref 12.0–46.0)
Lymphs Abs: 2.4 K/uL (ref 0.7–4.0)
MCHC: 33.4 g/dL (ref 30.0–36.0)
MCV: 90.5 fl (ref 78.0–100.0)
Monocytes Absolute: 0.6 K/uL (ref 0.1–1.0)
Monocytes Relative: 9.3 % (ref 3.0–12.0)
Neutro Abs: 3.3 K/uL (ref 1.4–7.7)
Neutrophils Relative %: 50.1 % (ref 43.0–77.0)
Platelets: 220 K/uL (ref 150.0–400.0)
RBC: 3.95 Mil/uL (ref 3.87–5.11)
RDW: 14 % (ref 11.5–15.5)
WBC: 6.6 K/uL (ref 4.0–10.5)

## 2023-12-06 LAB — URIC ACID: Uric Acid, Serum: 8.9 mg/dL — ABNORMAL HIGH (ref 2.4–7.0)

## 2023-12-06 LAB — B12 AND FOLATE PANEL
Folate: 18 ng/mL (ref >5.9)
Vitamin B-12: >1500 pg/mL — ABNORMAL HIGH (ref 211–911)

## 2023-12-06 MED ORDER — ALLOPURINOL 100 MG PO TABS: 50.0000 mg | ORAL_TABLET | ORAL | 3 refills | Status: AC

## 2023-12-06 NOTE — Patient Instructions (Addendum)
 It was very nice to see you today!  VISIT SUMMARY: Today, we reviewed your overall health and made adjustments to your treatment plan for diabetes, hypertension, gout, and anemia. We also discussed your sleep issues and recurrent hernia, and ordered necessary tests for further evaluation.  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 -Continue your current diabetes medications: Humalog  24 units daily, semaglutide  2 mg weekly, and Tresiba  2 units daily. -We ordered a comprehensive metabolic panel (CMP) to check your creatinine levels.  HYPERTENSION: Your blood pressure is slightly high, especially in the mornings. -Continue taking olmesartan  160 mg daily and labetalol  100 mg twice a day. -Monitor your blood pressure at home regularly.  HYPERLIPIDEMIA: Your cholesterol levels are stable. -Continue taking atorvastatin  40 mg daily.  GOUT: Your uric acid levels are high, and we need to adjust your medication due to your kidney condition. -Adjust allopurinol  to 50 mg every other day. -We checked your uric acid levels and will consider referring you to a rheumatologist if they remain high.  OBESITY, CLASS 3: Your BMI is high, which can affect your overall health. -We will continue to monitor your weight and discuss potential strategies for weight management in future visits.  ANEMIA, UNSPECIFIED: You have anemia, possibly related to your kidney condition. -We ordered blood tests to check your blood counts, vitamin B12, and folate levels. -Continue taking your iron supplements.  INSOMNIA: You have trouble falling and staying asleep and prefer natural remedies. -Try using the CBT-I Coach app for sleep improvement. -Consider taking melatonin as a natural sleep aid. -We may consider a sleep study if your insomnia persists.  VENTRAL HERNIA, RECURRENT, NON-OBSTRUCTED:  You have a recurrent hernia above your belly button, but it is not causing pain or obstruction. -Monitor for any symptoms of obstruction or pain.  RIGHT-SIDED CHEST WALL PAIN AFTER FALL: You have persistent pain in your right chest area from a fall in February, but it is improving. -Continue to monitor your symptoms. We will consider imaging if the pain worsens.  GENERAL HEALTH MAINTENANCE: You are due for colon cancer screening. -We ordered a Cologuard test for colon cancer screening.  Return in about 3 months (around 03/07/2024) for blood pressure, sleep .   Take care, Stephanie Hummer, MD, Stephanie [name]  For a Better Night's Sleep  1. Establish a Consistent Sleep Schedule  Go to bed and wake up at the same time every day, even on weekends. This helps regulate your body's internal clock. 2. Create a Relaxing Bedtime Routine  Engage in calming activities before bed, such as reading or taking a warm bath. Avoid screens (phones, tablets, computers) at least 30 minutes before bedtime. 3. Make Your Sleep Environment Comfortable  Ensure your bedroom is cool, quiet, and dark. Invest in a comfortable mattress and pillows. Consider using earplugs or a white noise machine if noise is an issue. 4. Be Mindful of Your Diet  Avoid large meals, caffeine, and alcohol close to bedtime. If you're hungry at night, opt for a light snack. 5. Stay Active During the Day  Regular physical activity can help you fall asleep faster and enjoy deeper sleep. Avoid vigorous exercise close to bedtime. 6. Manage Stress and Anxiety  Practice relaxation techniques such as deep breathing, meditation, or yoga. Keep a journal to write down your thoughts and worries before bed. 7. Limit Naps  If you need to nap, keep  it short (20-30 minutes) and avoid napping late in the afternoon. 8. Exposure to Geradine Mallory  Spend time outside in natural sunlight during the day. This helps regulate your sleep-wake cycle. 9. Avoid  Stimulants  Limit the use of stimulants like nicotine and caffeine, especially in the afternoon and evening. 10. Seek Professional Help if Needed  If you continue to have trouble sleeping, consult with your healthcare provider for further evaluation and treatment options. Remember: Good sleep hygiene is essential for your overall health and well-being. Following these tips can help you achieve a more restful and restorative sleep.   PLEASE NOTE:  If you had any lab tests, please let us  know if you have not heard back within a few days. You may see your results on mychart before we have a chance to review them but we will give you a call once they are reviewed by us .   If we ordered any referrals today, please let us  know if you have not heard from their office within the next week.   If you had any urgent prescriptions sent in today, please check with the pharmacy within an hour of our visit to make sure the prescription was transmitted appropriately.   Please try these tips to maintain a healthy lifestyle:  Eat at least 3 REAL meals and 1-2 snacks per day.  Aim for no more than 5 hours between eating.  If you eat breakfast, please do so within one hour of getting up.   Each meal should contain half fruits/vegetables, one quarter protein, and one quarter carbs (no bigger than a computer mouse)  Cut down on sweet beverages. This includes juice, soda, and sweet tea.   Drink at least 1 glass of water with each meal and aim for at least 8 glasses per day  Exercise at least 150 minutes every week.

## 2023-12-06 NOTE — Progress Notes (Signed)
 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 Stage 4 chronic kidney disease with proteinuria is managed by nephrology. Recent microalbumin creatinine ratio was 1000. No recent creatinine check since May. - Ordered CMP to check creatinine levels - Continue current diabetes management regimen  Hypertension Mildly hypertensive with a blood pressure of 141/89. Managed with olmesartan  160 mg daily and labetalol  100 mg twice a day. Blood pressure tends to be higher in the mornings. - Continue current antihypertensive regimen - Monitor blood pressure at home  Hyperlipidemia Well-managed with atorvastatin  40 mg. Recent lipid panel is stable. - Continue atorvastatin  40 mg daily  Gout Uric acid level was elevated at 9.2. Currently on allopurinol  100 mg daily, but dosing needs adjustment due to renal impairment. Allopurinol  should be taken at 50 mg every other day. - Adjusted allopurinol  to 50 mg every other day - Checked uric acid levels - Will consider referral to rheumatology if uric acid remains elevated  Obesity, class 3 BMI is elevated at 39, class 3 obesity. Multiple comorbidities present.  Anemia, unspecified Anemia possibly related to kidney dysfunction. Iron supplementation recommended. B12 and folate levels need rechecking. - Ordered CBC to check blood counts - Ordered B12 and folate levels - Continue iron supplementation  Insomnia Difficulty falling asleep and staying asleep. Prefers natural remedies over medication. Discussed non-pharmacological options including CBT-I Coach app and melatonin. - Recommended CBT-I Coach app for sleep improvement - Discussed melatonin as a natural sleep aid - Consider sleep study if insomnia persists  Ventral hernia, recurrent, non-obstructed Recurrent ventral hernia above the umbilicus,  non-obstructed and reducible. No current pain reported. - Monitor for symptoms of obstruction or pain  Right-sided chest wall pain after fall Persistent right-sided chest wall pain since a fall in February. Pain is improving but still present. No current need for imaging as symptoms are improving. - Continue to monitor symptoms and consider imaging if pain worsens  General Health Maintenance Due for colon cancer screening. - Ordered Cologuard for colon cancer screening      Medications Discontinued During This Encounter  Medication Reason   allopurinol  (ZYLOPRIM ) 100 MG tablet     Patient Counseling(The following topics were reviewed and/or handout was given):  -Nutrition: Stressed importance of moderation in sodium/caffeine intake, saturated fat and cholesterol, caloric balance, sufficient intake of fresh fruits, vegetables, and fiber.  -Stressed the importance of regular exercise.   -Substance Abuse: Discussed cessation/primary prevention of tobacco, alcohol, or other drug use; driving or other dangerous activities under the influence; availability of treatment for abuse.   -Injury prevention: Discussed safety belts, safety helmets, smoke detector, smoking near bedding or upholstery.   -Sexuality: Discussed sexually transmitted diseases, partner selection, use of condoms, avoidance of unintended pregnancy and contraceptive alternatives.   -Dental health: Discussed importance of regular tooth brushing, flossing, and dental visits.  -Health maintenance and immunizations reviewed. Please refer to Health maintenance section.  Return in about 3 months (around 03/07/2024) for blood pressure, sleep .        Subjective:   Encounter date: 12/06/2023  Chief Complaint  Patient presents with   Establish Care    6 month follow up. Pt is fasting today   HM due- cologuard and diabetic eye exam scheuled for next month 12/2023   Diabetes   side pain    Pt c/o of right side discomfort  near  ribs after fall a few months ago.    Hypertension    Pt reading at home range from 140/80-90.    Discussed the use of AI scribe software for clinical note transcription with the patient, who gave verbal consent to proceed.  History of Present Illness Stephanie [name] is a 68 year old female who presents to establish care.  Glycemic control - Type 2 diabetes mellitus, well managed with recent hemoglobin A1c of 5.9%. - Current medications: Humalog  24 units daily, semaglutide  2 mg weekly, Tresiba  2 units daily. - Follows with endocrinology for diabetes management.  Renal dysfunction - Stage 4 chronic kidney disease with proteinuria. - Microalbumin/creatinine ratio elevated at 1000. - No creatinine check since May. - Follows with nephrology for kidney disease.  Hyperlipidemia - Hyperlipidemia, well controlled on atorvastatin  40 mg daily. - Recent lipid panel stable.  Hypertension - Hypertension managed with olmesartan  160 mg daily, labetalol  100 mg twice daily, and furosemide  40 mg three times a week. - Previously on amlodipine , currently discontinued.  Gout and hyperuricemia - History of gout, currently on allopurinol  100 mg daily. - Recent uric acid level elevated at 9.2. - No current gout attacks; feet asymptomatic.  Musculoskeletal pain - Persistent right-sided rib cage pain following a fall in February. - Pain is improving but still present.  Sleep disturbance - Insomnia characterized by difficulty falling asleep and staying asleep. - Prefers to avoid pharmacologic treatment for sleep.  Abdominal wall hernia - History of ventral hernia repair two to three years ago. - Recurrence of hernia above the umbilicus. - No current pain associated with hernia.  Anemia - Previously informed of anemia. - Interested in evaluation of blood counts, vitamin B12, and folate levels.       12/06/2023    2:28 PM 07/01/2022    1:06 PM 03/24/2022   10:38 AM 09/01/2021   11:16 AM  07/01/2021   10:22 AM  Depression screen PHQ 2/9  Decreased Interest 3 0 0 0 0  Down, Depressed, Hopeless 0 0 0 0 0  PHQ - 2 Score 3 0 0 0 0  Altered sleeping 1      Tired, decreased energy 0      Change in appetite 1      Feeling bad or failure about yourself  0      Trouble concentrating 0      Moving slowly or fidgety/restless 0      Suicidal thoughts 0      PHQ-9 Score 5      Difficult doing work/chores Not difficult at all           12/06/2023    2:28 PM 05/10/2021    1:57 PM  GAD 7 : Generalized Anxiety Score  Nervous, Anxious, on Edge 0 0  Control/stop worrying 0 0  Worry too much - different things 0 0  Trouble relaxing 0 0  Restless 0 0  Easily annoyed or irritable 0 0  Afraid - awful might happen 0 0  Total GAD 7 Score 0 0  Anxiety Difficulty Not difficult at all Not difficult at all    Health Maintenance Due  Topic Date Due   OPHTHALMOLOGY EXAM  Never done   Fecal DNA (Cologuard)  Never done   Medicare Annual Wellness (AWV)  07/01/2023     PMH:  The following were reviewed and entered/updated in epic: Past Medical History:  Diagnosis Date   Abnormal Pap smear    Asthma    Diabetes  mellitus    Hyperlipidemia    Hypertension    LGSIL (low grade squamous intraepithelial dysplasia)     Patient Active Problem List   Diagnosis Date Noted   Primary insomnia 12/06/2023   Anemia due to stage 4 chronic kidney disease (HCC) 12/06/2023   Need for influenza vaccination 12/02/2021   Stage 3b chronic kidney disease (HCC) 12/02/2021   History of gout 07/01/2021   Stage 4 chronic kidney disease (HCC) 05/10/2021   Ventral incisional hernia 10/22/2020   Uncontrolled type 2 diabetes mellitus with hyperglycemia, with long-term current use of insulin  (HCC) 12/07/2016   Diabetes type 2, uncontrolled 10/28/2013   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/2013   Type II diabetes mellitus with renal manifestations (HCC) 09/13/2013   Hypertension associated with diabetes (HCC)  09/13/2013   Hyperlipidemia associated with type 2 diabetes mellitus (HCC) 09/13/2013    Past Surgical History:  Procedure Laterality Date   ABDOMINAL HYSTERECTOMY     bladder surgery     BOWEL RESECTION N/A 10/23/2020   Procedure: SMALL BOWEL RESECTION;  Surgeon: Stevie Herlene Righter, MD;  Location: [facility] OR;  Service: General;  Laterality: N/A;   COLPOSCOPY     INCISIONAL HERNIA REPAIR N/A 10/23/2020   Procedure: OPEN HERNIA REPAIR INCISIONAL WITH MESH;  Surgeon: Stevie, Herlene Righter, MD;  Location: [facility] OR;  Service: General;  Laterality: N/A;   INSERTION OF MESH N/A 10/23/2020   Procedure: INSERTION OF MESH;  Surgeon: Stevie Herlene Righter, MD;  Location: [facility] OR;  Service: General;  Laterality: N/A;   LAPAROTOMY N/A 10/23/2020   Procedure: EXPLORATORY LAPAROTOMY;  Surgeon: Stevie Herlene Righter, MD;  Location: [facility] OR;  Service: General;  Laterality: N/A;    Family History  Problem Relation Age of Onset   Diabetes Mother    Diabetes Maternal Grandmother    Heart disease Neg Hx    Hypertension Neg Hx     Medications- reviewed and updated Outpatient Medications Prior to Visit  Medication Sig Dispense Refill   Accu-Chek FastClix Lancets MISC Use accu chek fastclix lancets to check blood sugar 2-3 times daily. DX:E11.65 300 each 1   acetaminophen  (TYLENOL ) 325 MG tablet Take 2 tablets (650 mg total) by mouth every 6 (six) hours as needed for mild pain, fever or headache.     albuterol  (VENTOLIN  HFA) 108 (90 Base) MCG/ACT inhaler Inhale 2 puffs into the lungs every 6 (six) hours as needed for wheezing or shortness of breath. 8 g 0   AREXVY 120 MCG/0.5ML injection      ascorbic acid  (VITAMIN C) 500 MG tablet Take 1 tablet (500 mg total) by mouth 3 (three) times daily. 90 tablet 0   Blood Glucose Monitoring Suppl (ACCU-CHEK GUIDE ME) w/Device KIT 1 each by Does not apply route 3 (three) times daily. Use accu chek guide me to check blood sugar 2-3 times daily. DX:E11.65 1 kit 0   ferrous  sulfate 325 (65 FE) MG tablet Take 1 tablet (325 mg total) by mouth 3 (three) times daily with meals. (Patient taking differently: Take 325 mg by mouth daily. Pt is taking lowest dosage 25MG ) 90 tablet 0   furosemide  (LASIX ) 40 MG tablet Take 1 tablet (40 mg total) by mouth daily. Takes Tues., Thurs., Sat. 90 tablet 5   glucose blood (ACCU-CHEK GUIDE TEST) test strip Use to test blood glucose before meals and at bedtime 300 each 12   glucose blood (ACCU-CHEK GUIDE) test strip USE TO CHECK BLOOD GLUCOSE THREE TO FOUR TIMES DAILY 300 each  3   hydrocortisone  valerate cream (WESTCORT ) 0.2 % APPLY A THIN COAT TO RASH DAILY AS NEEDED 45 g 0   Insulin  Lispro Prot & Lispro (HUMALOG  MIX 75/25 KWIKPEN) (75-25) 100 UNIT/ML Kwikpen INJECT 24 UNITS  BEFORE  DINNER 30 mL 3   Insulin  Pen Needle (BD PEN NEEDLE NANO 2ND GEN) 32G X 4 MM MISC USE 1  THREE TIMES DAILY 100 each 3   Insulin  Pen Needle (RELION PEN NEEDLES) 31G X 6 MM MISC USE 1  THREE TIMES DAILY 200 each 0   labetalol  (NORMODYNE ) 100 MG tablet Take 1 tablet (100 mg total) by mouth 2 (two) times daily. 60 tablet 3   potassium chloride  (KLOR-CON  M) 10 MEQ tablet Take 1 tablet (10 mEq total) by mouth daily. 90 tablet 0   rosuvastatin  (CRESTOR ) 40 MG tablet Take 1 tablet (40 mg total) by mouth daily. 90 tablet 3   Semaglutide , 2 MG/DOSE, 8 MG/3ML SOPN Inject 2 mg as directed once a week. 9 mL 4   TRESIBA  FLEXTOUCH 100 UNIT/ML FlexTouch Pen Inject 12 Units into the skin daily. 15 mL 3   valsartan  (DIOVAN ) 160 MG tablet Take 1 tablet by mouth once daily 90 tablet 3   allopurinol  (ZYLOPRIM ) 100 MG tablet Take 1 tablet (100 mg total) by mouth daily. 90 tablet 0   amLODipine  (NORVASC ) 5 MG tablet Take 1 tablet (5 mg total) by mouth daily. (Patient not taking: Reported on 12/06/2023) 90 tablet 1   No facility-administered medications prior to visit.     Allergies  Allergen Reactions   Avapro  [Irbesartan ] Other (See Comments)    headaches   Codeine Nausea  And Vomiting   Dulaglutide  Nausea And Vomiting   Lisinopril  Itching    Social History   Socioeconomic History   Marital status: Single    Spouse name: Not on file   Number of children: Not on file   Years of education: Not on file   Highest education level: Not on file  Occupational History   Not on file  Tobacco Use   Smoking status: Never   Smokeless tobacco: Never  Vaping Use   Vaping status: Never Used  Substance and Sexual Activity   Alcohol use: No   Drug use: No   Sexual activity: Yes    Birth control/protection: Surgical    Comment: hyst  Other Topics Concern   Not on file  Social History Narrative   Not on file   Social Drivers of Health   Financial Resource Strain: Low Risk  (07/01/2022)   Overall Financial Resource Strain (CARDIA)    Difficulty of Paying Living Expenses: Not hard at all  Food Insecurity: No Food Insecurity (07/01/2022)   Hunger Vital Sign    Worried About Running Out of Food in the Last Year: Never true    Ran Out of Food in the Last Year: Never true  Transportation Needs: No Transportation Needs (07/01/2022)   PRAPARE - Administrator, Civil Service (Medical): No    Lack of Transportation (Non-Medical): No  Physical Activity: Inactive (07/01/2022)   Exercise Vital Sign    Days of Exercise per Week: 0 days    Minutes of Exercise per Session: 0 min  Stress: No Stress Concern Present (07/01/2022)   [employer] of Occupational Health - Occupational Stress Questionnaire    Feeling of Stress : Not at all  Social Connections: Moderately Isolated (06/29/2021)   Social Connection and Isolation Panel    Frequency of  Communication with Friends and Family: Three times a week    Frequency of Social Gatherings with Friends and Family: Three times a week    Attends Religious Services: More than 4 times per year    Active Member of Clubs or Organizations: No    Attends Banker Meetings: Never    Marital Status: Never married            Objective:  Physical Exam: BP (!) 141/89 (BP Location: Right Arm, Patient Position: Sitting, Cuff Size: Large) Comment: recheck  Pulse 82   Temp (!) 97.3 F (36.3 C) (Temporal)   Resp 18   Wt 221 lb 3.2 oz (100.3 kg)   SpO2 97%   BMI 39.18 kg/m   Body mass index is 39.18 kg/m. Wt Readings from Last 3 Encounters:  12/06/23 221 lb 3.2 oz (100.3 kg)  11/20/23 218 lb (98.9 kg)  07/12/23 209 lb 12.8 oz (95.2 kg)    Physical Exam          Physical Exam Constitutional:      General: She is not in acute distress.    Appearance: Normal appearance. She is not ill-appearing or toxic-appearing.  HENT:     Head: Normocephalic and atraumatic.     Nose: Nose normal. No congestion.  Eyes:     General: No scleral icterus.    Extraocular Movements: Extraocular movements intact.  Cardiovascular:     Rate and Rhythm: Normal rate and regular rhythm.     Pulses: Normal pulses.     Heart sounds: Normal heart sounds.  Pulmonary:     Effort: Pulmonary effort is normal. No respiratory distress.     Breath sounds: Normal breath sounds.  Abdominal:     General: Abdomen is flat. Bowel sounds are normal.     Palpations: Abdomen is soft.     Tenderness: There is no abdominal tenderness.     Hernia: A hernia is present. Hernia is present in the ventral area (Easily reducible).   Musculoskeletal:        General: Normal range of motion.  Lymphadenopathy:     Cervical: No cervical adenopathy.  Skin:    General: Skin is warm and dry.     Findings: No rash.  Neurological:     General: No focal deficit present.     Mental Status: She is alert and oriented to person, place, and time. Mental status is at baseline.  Psychiatric:        Mood and Affect: Mood normal.        Behavior: Behavior normal.        Thought Content: Thought content normal.        Judgment: Judgment normal.         Prior labs:   Recent Results (from the past 2160 hours)  POCT glycosylated hemoglobin  (Hb A1C)     Status: Abnormal   Collection Time: 11/20/23 10:58 AM  Result Value Ref Range   Hemoglobin A1C 5.9 (A) 4.0 - 5.6 %   HbA1c POC (<> result, manual entry)     HbA1c, POC (prediabetic range)     HbA1c, POC (controlled diabetic range)    Lipid panel     Status: None   Collection Time: 11/20/23 11:24 AM  Result Value Ref Range   Cholesterol 168 <200 mg/dL   HDL 59 > OR = 50 mg/dL   Triglycerides 898 <849 mg/dL   LDL Cholesterol (Calc) 90 mg/dL (calc)    Comment: Reference  range: <100 . Desirable range <100 mg/dL for primary prevention;   <70 mg/dL for patients with CHD or diabetic patients  with > or = 2 CHD risk factors. SABRA LDL-C is now calculated using the Martin-Hopkins  calculation, which is a validated novel method providing  better accuracy than the Friedewald equation in the  estimation of LDL-C.  Gladis APPLETHWAITE et al. SANDREA. 7986;689(80): 2061-2068  (http://education.[facility].com/faq/FAQ164)    Total CHOL/HDL Ratio 2.8 <5.0 (calc)   Non-HDL Cholesterol (Calc) 109 <130 mg/dL (calc)    Comment: For patients with diabetes plus 1 major ASCVD risk  factor, treating to a non-HDL-C goal of <100 mg/dL  (LDL-C of <29 mg/dL) is considered a therapeutic  option.   Microalbumin / creatinine urine ratio     Status: Abnormal   Collection Time: 11/20/23 11:24 AM  Result Value Ref Range   Creatinine, Urine 70 20 - 275 mg/dL   Microalb, Ur 25.3 mg/dL    Comment: Verified by repeat analysis. SABRA Reference Range Not established    Microalb Creat Ratio 1,066 (H) <30 mg/g creat    Comment: . The ADA defines abnormalities in albumin excretion as follows: SABRA Albuminuria Category        Result (mg/g creatinine) . Normal to Mildly increased   <30 Moderately increased         30-299  Severely increased           > OR = 300 . The ADA recommends that at least two of three specimens collected within a 3-6 month period be abnormal before considering a patient to be within a  diagnostic category.   Comp Met (CMET)     Status: Abnormal   Collection Time: 12/06/23  2:21 PM  Result Value Ref Range   Sodium 140 135 - 145 mEq/L   Potassium 4.3 3.5 - 5.1 mEq/L   Chloride 105 96 - 112 mEq/L   CO2 27 19 - 32 mEq/L    Comment: Elevated LDH levels may cause falsely increased CO2 results. If LDH is >2000 U/L, a positive bias of 12% is possible.   Glucose, Bld 93 70 - 99 mg/dL   BUN 32 (H) 6 - 23 mg/dL   Creatinine, Ser 8.28 (H) 0.40 - 1.20 mg/dL   Total Bilirubin 0.7 0.2 - 1.2 mg/dL   Alkaline Phosphatase 49 39 - 117 U/L   AST 18 0 - 37 U/L   ALT 16 0 - 35 U/L   Total Protein 7.9 6.0 - 8.3 g/dL   Albumin 4.2 3.5 - 5.2 g/dL   GFR 69.93 (L) >39.99 mL/min    Comment: Calculated using the CKD-EPI Creatinine Equation (2021)   Calcium  9.6 8.4 - 10.5 mg/dL  Uric acid     Status: Abnormal   Collection Time: 12/06/23  2:21 PM  Result Value Ref Range   Uric Acid, Serum 8.9 (H) 2.4 - 7.0 mg/dL  A87 and Folate Panel     Status: Abnormal   Collection Time: 12/06/23  2:21 PM  Result Value Ref Range   Vitamin B-12 >1500 (H) 211 - 911 pg/mL   Folate 18.0 >5.9 ng/mL  CBC with Differential/Platelet     Status: Abnormal   Collection Time: 12/06/23  2:21 PM  Result Value Ref Range   WBC 6.6 4.0 - 10.5 K/uL   RBC 3.95 3.87 - 5.11 Mil/uL   Hemoglobin 12.0 12.0 - 15.0 g/dL   HCT 64.1 (L) 63.9 - 53.9 %   MCV 90.5 78.0 -  100.0 fl   MCHC 33.4 30.0 - 36.0 g/dL   RDW 85.9 88.4 - 84.4 %   Platelets 220.0 150.0 - 400.0 K/uL   Neutrophils Relative % 50.1 43.0 - 77.0 %   Lymphocytes Relative 36.0 12.0 - 46.0 %   Monocytes Relative 9.3 3.0 - 12.0 %   Eosinophils Relative 3.7 0.0 - 5.0 %   Basophils Relative 0.9 0.0 - 3.0 %   Neutro Abs 3.3 1.4 - 7.7 K/uL   Lymphs Abs 2.4 0.7 - 4.0 K/uL   Monocytes Absolute 0.6 0.1 - 1.0 K/uL   Eosinophils Absolute 0.2 0.0 - 0.7 K/uL   Basophils Absolute 0.1 0.0 - 0.1 K/uL    Lab Results  Component Value Date   CHOL 168 11/20/2023   CHOL 195  01/26/2023   CHOL 147 03/16/2022   Lab Results  Component Value Date   HDL 59 11/20/2023   HDL 52 01/26/2023   HDL 39.20 03/16/2022   Lab Results  Component Value Date   LDLCALC 90 11/20/2023   LDLCALC 123 (H) 01/26/2023   LDLCALC 83 03/16/2022   Lab Results  Component Value Date   TRIG 101 11/20/2023   TRIG 92 01/26/2023   TRIG 124.0 03/16/2022   Lab Results  Component Value Date   CHOLHDL 2.8 11/20/2023   CHOLHDL 3.8 01/26/2023   CHOLHDL 4 03/16/2022   Lab Results  Component Value Date   LDLDIRECT 185.0 06/05/2023   LDLDIRECT 89.0 12/05/2016   LDLDIRECT 183.0 10/01/2015    Last metabolic panel Lab Results  Component Value Date   GLUCOSE 93 12/06/2023   NA 140 12/06/2023   K 4.3 12/06/2023   CL 105 12/06/2023   CO2 27 12/06/2023   BUN 32 (H) 12/06/2023   CREATININE 1.71 (H) 12/06/2023   GFR 30.06 (L) 12/06/2023   CALCIUM  9.6 12/06/2023   PROT 7.9 12/06/2023   ALBUMIN 4.2 12/06/2023   LABGLOB 3.4 09/01/2021   BILITOT 0.7 12/06/2023   ALKPHOS 49 12/06/2023   AST 18 12/06/2023   ALT 16 12/06/2023   ANIONGAP 5 10/30/2020    Lab Results  Component Value Date   HGBA1C 5.9 (A) 11/20/2023    Last CBC Lab Results  Component Value Date   WBC 6.6 12/06/2023   HGB 12.0 12/06/2023   HCT 35.8 (L) 12/06/2023   MCV 90.5 12/06/2023   MCH 29.7 10/31/2020   RDW 14.0 12/06/2023   PLT 220.0 12/06/2023    No results found for: TSH  No results found for: PSA1, PSA  Last vitamin D No results found for: MARIEN BOLLS, VD25OH  Lab Results  Component Value Date   BILIRUBINUR NEGATIVE 12/05/2016   PROTEINUR >300 (A) 01/02/2014   UROBILINOGEN 1.0 12/05/2016   LEUKOCYTESUR NEGATIVE 12/05/2016    Lab Results  Component Value Date   MICROALBUR 74.6 11/20/2023     At today's visit, we discussed treatment options, associated risk and benefits, and engage in counseling as needed.  Additionally the following were reviewed: Past medical  records, past medical and surgical history, family and social background, as well as relevant laboratory results, imaging findings, and specialty notes, where applicable.  This message was generated using dictation software, and as a result, it may contain unintentional typos or errors.  Nevertheless, extensive effort was made to accurately convey at the pertinent aspects of the patient visit.    There may have been are other unrelated non-urgent complaints, but due to the busy schedule and the amount of time  already spent with her, time does not permit to address these issues at today's visit. Another appointment may have or has been requested to review these additional issues.     Beverley KATHEE Hummer, MD  I,Emily Lagle,acting as a scribe for Beverley KATHEE Hummer, MD.,have documented all relevant documentation on the behalf of Beverley KATHEE Hummer, MD.  Stephanie [name] Beverley KATHEE Hummer, MD, have reviewed all documentation for this visit. The documentation on 12/06/2023 for the exam, diagnosis, procedures, and orders are all accurate and complete.

## 2023-12-07 ENCOUNTER — Ambulatory Visit: Admitting: Family Medicine

## 2023-12-07 ENCOUNTER — Ambulatory Visit: Payer: Self-pay | Admitting: Family Medicine

## 2023-12-07 LAB — IRON,TIBC AND FERRITIN PANEL
%SAT: 22 % (ref 16–45)
Ferritin: 54 ng/mL (ref 16–288)
Iron: 77 ug/dL (ref 45–160)
TIBC: 349 ug/dL (ref 250–450)

## 2023-12-12 LAB — HM DIABETES EYE EXAM

## 2023-12-14 ENCOUNTER — Ambulatory Visit: Payer: Self-pay

## 2023-12-14 NOTE — Telephone Encounter (Signed)
 FYI Only or Action Required?: Action required by provider: referral request.  Patient was last seen in primary care on 12/06/2023 by Sebastian Beverley NOVAK, MD.  Called Nurse Triage reporting Advice Only.  Symptoms began n/a.  Interventions attempted: Other: n/a.  Symptoms are: n/a.  Triage Disposition: Information or Advice Only Call  Patient/caregiver understands and will follow disposition?: Yes Reason for Disposition  Health information question, no triage required and triager able to answer question  Answer Assessment - Initial Assessment Questions Patient stated she saw kidney doctor yesterday and was advised not to take B12 supplement at all. Patient stated house call nurse today suggested getting a right shoulder x-ray due to a fall that patient had previously. And a referral to a hearing doctor. Please advise.   1. REASON FOR CALL: What is the main reason for your call? or How can I best help you?     Patient wanted more clarification on lab results  Protocols used: Information Only Call - No Triage-A-AH  Copied from CRM [id number]. Topic: Clinical - Lab/Test Results >> Dec 14, 2023  4:37 PM Nessti S wrote: Reason for CRM: pt called because she wants nurse to reread lab results. Call back number [phone]   ----------------------------------------------------------------------- From previous Reason for Contact - Other: Reason for CRM:

## 2023-12-15 LAB — COLOGUARD

## 2023-12-19 NOTE — Progress Notes (Signed)
 Stephanie [name]                                          MRN: [id number]   12/19/2023   The [facility] Quality Team Specialist reviewed this patient medical record for the purposes of chart review for care gap closure. The following were reviewed: chart review for care gap closure-colorectal cancer screening and diabetic eye exam.    [facility] Quality Team

## 2023-12-19 NOTE — Progress Notes (Signed)
 Stephanie [name]                                          MRN: [id number]   12/19/2023   The [facility] Quality Team Specialist reviewed this patient medical record for the purposes of chart review for care gap closure. The following were reviewed: abstraction for care gap closure-glycemic status assessment. KED closed in [facility] portal    [facility]BCI Quality Team

## 2023-12-19 NOTE — Telephone Encounter (Signed)
 ATC patient to see if she needs clarification on her labs or if she needs a referral. Patient was unable to answer.

## 2023-12-20 NOTE — Telephone Encounter (Signed)
 Spoke to patient she has already got every thing taken care of.

## 2023-12-26 ENCOUNTER — Telehealth: Payer: Self-pay

## 2023-12-26 LAB — COLOGUARD: COLOGUARD: NEGATIVE

## 2023-12-26 NOTE — Telephone Encounter (Signed)
 Pt requesting letter for exemption from jury duty from PCP. PCP declined. I offered to schedule pt an appt to discuss with PCP. Pt scheduled for OV with PCP on 01/03/24.

## 2024-01-02 ENCOUNTER — Encounter (HOSPITAL_COMMUNITY): Payer: Self-pay | Admitting: General Surgery

## 2024-01-02 ENCOUNTER — Ambulatory Visit: Admitting: Family Medicine

## 2024-01-03 ENCOUNTER — Ambulatory Visit: Admitting: Family Medicine

## 2024-01-10 ENCOUNTER — Ambulatory Visit

## 2024-01-10 ENCOUNTER — Ambulatory Visit: Admitting: Family Medicine

## 2024-01-10 VITALS — BP 155/83 | HR 81 | Temp 98.4°F | Ht 63.0 in | Wt 215.4 lb

## 2024-01-10 DIAGNOSIS — M546 Pain in thoracic spine: Secondary | ICD-10-CM

## 2024-01-10 DIAGNOSIS — E1159 Type 2 diabetes mellitus with other circulatory complications: Secondary | ICD-10-CM

## 2024-01-10 DIAGNOSIS — Z794 Long term (current) use of insulin: Secondary | ICD-10-CM | POA: Diagnosis not present

## 2024-01-10 DIAGNOSIS — E113313 Type 2 diabetes mellitus with moderate nonproliferative diabetic retinopathy with macular edema, bilateral: Secondary | ICD-10-CM | POA: Insufficient documentation

## 2024-01-10 DIAGNOSIS — E1122 Type 2 diabetes mellitus with diabetic chronic kidney disease: Secondary | ICD-10-CM | POA: Diagnosis not present

## 2024-01-10 DIAGNOSIS — N1832 Chronic kidney disease, stage 3b: Secondary | ICD-10-CM

## 2024-01-10 DIAGNOSIS — G540 Brachial plexus disorders: Secondary | ICD-10-CM

## 2024-01-10 DIAGNOSIS — M25511 Pain in right shoulder: Secondary | ICD-10-CM

## 2024-01-10 DIAGNOSIS — M542 Cervicalgia: Secondary | ICD-10-CM | POA: Diagnosis not present

## 2024-01-10 DIAGNOSIS — G8929 Other chronic pain: Secondary | ICD-10-CM

## 2024-01-10 DIAGNOSIS — I129 Hypertensive chronic kidney disease with stage 1 through stage 4 chronic kidney disease, or unspecified chronic kidney disease: Secondary | ICD-10-CM | POA: Diagnosis not present

## 2024-01-10 DIAGNOSIS — G44329 Chronic post-traumatic headache, not intractable: Secondary | ICD-10-CM | POA: Diagnosis not present

## 2024-01-10 MED ORDER — DICLOFENAC SODIUM 1 % EX GEL: 4.0000 g | Freq: Four times a day (QID) | CUTANEOUS | 3 refills | Status: AC | PRN

## 2024-01-10 MED ORDER — INDAPAMIDE 1.25 MG PO TABS: 1.2500 mg | ORAL_TABLET | Freq: Every day | ORAL | 3 refills | Status: AC

## 2024-01-10 MED ORDER — VALSARTAN 320 MG PO TABS: 320.0000 mg | ORAL_TABLET | Freq: Every day | ORAL | 3 refills | Status: DC

## 2024-01-10 NOTE — Progress Notes (Addendum)
 Assessment & Plan   Assessment/Plan:   Assessment and Plan Assessment & Plan Chronic pain involving headache, right shoulder, neck, and mid-back with right sided thoracic outlet syndrome Chronic pain persists since a fall in February, with intermittent headaches at the site of head impact and right-sided discomfort. Possible thoracic outlet syndrome due to cervical spine nerve compression. No new blurry vision or nausea associated with headaches. Recent nausea and vomiting likely due to medication intake without food. - Ordered MRI of the brain w/wo contrast to rule out brain bleed, malignancy, or other intracranial pathology. - Ordered x-rays of cervical, thoracic, and lumbar spine, and right shoulder to assess for structural issues. - Recommended Tylenol  and Voltaren  gel for pain management. 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 Current medications include Lasix , valsartan , and labetalol . Amlodipine  previously caused leg swelling. Recent kidney function tests show stable renal function GFR 30. Indapamide  considered safer for renal function. - Increased valsartan  320 mg daily - Initiated indapamide  1.25 mg daily - Scheduled blood work in two weeks to monitor kidney function and electrolytes - Continue other antihypertensives - Refer to pharmacy for assistanct  Type 2 diabetes mellitus with diabetic retinopathy Blood sugar level was 116 mg/dL before medication intake. Scheduled to see an eye doctor for diabetic retinopathy and macular edema. - Continue current diabetes management plan. - Follow up with eye doctor for diabetic retinopathy.  Morbid Obesity BMI 38 w/ comorbidies of hypertension, diabetes, and renal disease Recommend low carb, heart healthy meditaranean style diet            Subjective:   Encounter date: 01/10/2024  Stephanie [name] is a 68 y.o. 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   She  has a past medical history of Abnormal Pap smear, Asthma, Diabetes mellitus, Hyperlipidemia, Hypertension, and LGSIL (low grade squamous intraepithelial dysplasia)..   She presents with chief complaint of Medical Management of Chronic Issues (Pt presents today for a f/u from a fall from months ago. States she has been getting headaches and still feel pain on her right side in certain positions. Pt is fasting ) . Discussed the use of AI scribe software for clinical note transcription with the patient, who gave verbal consent to proceed.  History of Present Illness Stephanie [name] is a 68 year old female who presents with persistent headaches and right-sided discomfort after a fall.  Headache - Persistent headaches since a fall 10 months ago, with impact to the top of the head while ascending steps - Headaches are intermittent, occurring at the site of impact, with some headache-free days - Current episode of headache has persisted for approximately 10 days - Uncertain if  headaches are related to the fall or elevated blood pressure - No new vision changes associated with the headaches  Right-sided musculoskeletal pain - Right-sided discomfort present since the fall, affecting the shoulder and scapular ('wings') region - Initial sensation described as a line across the nerves on the right side, involving the shoulder and back - Pain intensity has decreased since the initial injury but persists with certain movements  Hypertension - History of elevated blood pressure, with recent readings around 155/160 mmHg - Currently taking Lasix , labetalol , and valsartan  for blood pressure management - Generally adheres to medication regimen, but on the day of the recorded blood pressure, had not yet taken medications - Episode of nausea and vomiting after taking all antihypertensive medications at once without eating  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 - History of type 2 diabetes with diabetic retinopathy and macular edema - Scheduled to see an ophthalmologist for ongoing blurry vision, which has been present since before the fall - Blood glucose measured at 116 mg/dL prior to taking diabetes medication  Chronic kidney disease 3 - History of kidney issues with most recent kidney function test showing stable results - Under the care of a nephrologist - Kidneys have been stressed due to high blood pressure  ROS  Past Surgical History:  Procedure Laterality Date   ABDOMINAL HYSTERECTOMY     bladder surgery     BOWEL RESECTION N/A 10/23/2020   Procedure: SMALL BOWEL RESECTION;  Surgeon: Stevie, Herlene Righter, MD;  Location: [facility] OR;  Service: General;  Laterality: N/A;   COLPOSCOPY     INCISIONAL HERNIA REPAIR N/A 10/23/2020   Procedure: OPEN HERNIA REPAIR INCISIONAL WITH MESH;  Surgeon: Stevie, Herlene Righter, MD;  Location: [facility] OR;  Service: General;  Laterality: N/A;   INSERTION OF MESH N/A 10/23/2020   Procedure: INSERTION OF MESH;  Surgeon: Stevie  Herlene Righter, MD;  Location: [facility] OR;  Service: General;  Laterality: N/A;   LAPAROTOMY N/A 10/23/2020   Procedure: EXPLORATORY LAPAROTOMY;  Surgeon: Stevie Herlene Righter, MD;  Location: [facility] OR;  Service: General;  Laterality: N/A;    Medications Ordered Prior to Encounter[1]  Family History  Problem Relation Age of Onset   Diabetes Mother    Diabetes Maternal Grandmother    Heart disease Neg Hx    Hypertension Neg Hx     Social History   Socioeconomic History   Marital status: Single    Spouse name: Not on file   Number of children: Not on file   Years of education: Not on file   Highest education level: Not on file  Occupational History   Not on file  Tobacco Use   Smoking status: Never   Smokeless tobacco: Never  Vaping Use   Vaping status: Never Used  Substance and Sexual Activity   Alcohol use: No   Drug use: No   Sexual activity: Yes    Birth control/protection: Surgical    Comment: hyst  Other Topics Concern   Not on file  Social History Narrative   Not on file   Social Drivers of Health   Tobacco Use: Low Risk (12/06/2023)   Patient History    Smoking Tobacco Use: Never    Smokeless Tobacco Use: Never    Passive Exposure: Not on file  Financial Resource Strain: Low Risk (07/01/2022)   Overall Financial Resource Strain (CARDIA)    Difficulty of Paying Living Expenses: Not hard at all  Food Insecurity: No Food Insecurity (07/01/2022)   Hunger  Vital Sign    Worried About Programme Researcher, Broadcasting/film/video in the Last Year: Never true    Ran Out of Food in the Last Year: Never true  Transportation Needs: No Transportation Needs (07/01/2022)   PRAPARE - Administrator, Civil Service (Medical): No    Lack of Transportation (Non-Medical): No  Physical Activity: Inactive (07/01/2022)   Exercise Vital Sign    Days of Exercise per Week: 0 days    Minutes of Exercise per Session: 0 min  Stress: No Stress Concern Present (07/01/2022)   [employer] of Occupational Health  - Occupational Stress Questionnaire    Feeling of Stress : Not at all  Social Connections: Moderately Isolated (06/29/2021)   Social Connection and Isolation Panel    Frequency of Communication with Friends and Family: Three times a week    Frequency of Social Gatherings with Friends and Family: Three times a week    Attends Religious Services: More than 4 times per year    Active Member of Clubs or Organizations: No    Attends Banker Meetings: Never    Marital Status: Never married  Intimate Partner Violence: Not At Risk (06/29/2021)   Humiliation, Afraid, Rape, and Kick questionnaire    Fear of Current or Ex-Partner: No    Emotionally Abused: No    Physically Abused: No    Sexually Abused: No  Depression (PHQ2-9): Low Risk (01/10/2024)   Depression (PHQ2-9)    PHQ-2 Score: 0  Recent Concern: Depression (PHQ2-9) - Medium Risk (12/06/2023)   Depression (PHQ2-9)    PHQ-2 Score: 5  Alcohol Screen: Low Risk (06/29/2021)   Alcohol Screen    Last Alcohol Screening Score (AUDIT): 0  Housing: Low Risk (07/01/2022)   Housing    Last Housing Risk Score: 0  Utilities: Not At Risk (07/01/2022)   AHC Utilities    Threatened with loss of utilities: No  Health Literacy: Not on file                                                                                                  Objective:  Physical Exam: BP (!) 155/83   Pulse 81   Temp 98.4 F (36.9 C)   Ht 5' 3 (1.6 m)   Wt 215 lb 6.4 oz (97.7 kg)   SpO2 97%   BMI 38.16 kg/m      Physical Exam Constitutional:      General: She is not in acute distress.    Appearance: Normal appearance. She is not ill-appearing or toxic-appearing.  HENT:     Head: Normocephalic and atraumatic.     Right Ear: Hearing, tympanic membrane, ear canal and external ear normal. There is no impacted cerumen.     Left Ear: Hearing, tympanic membrane, ear canal and external ear normal. There is no impacted cerumen.     Nose: Nose normal. No  congestion.     Mouth/Throat:     Lips: No lesions.     Mouth: Mucous membranes are moist.     Pharynx: Oropharynx is clear. No oropharyngeal exudate.  Eyes:     General: No scleral icterus.       Right eye: No discharge.        Left eye: No discharge.     Conjunctiva/sclera: Conjunctivae normal.     Pupils: Pupils are equal, round, and reactive to light.  Neck:     Thyroid : No thyroid  mass, thyromegaly or thyroid  tenderness.  Cardiovascular:     Rate and Rhythm: Normal rate and regular rhythm.     Pulses: Normal pulses.     Heart sounds: Normal heart sounds.  Pulmonary:     Effort: Pulmonary effort is normal. No respiratory distress.     Breath sounds: Normal breath sounds.  Abdominal:     General: Abdomen is flat. Bowel sounds are normal.     Palpations: Abdomen is soft.  Musculoskeletal:        General: Normal range of motion.     Cervical back: Normal range of motion.     Right lower leg: No edema.     Left lower leg: No edema.  Lymphadenopathy:     Cervical: No cervical adenopathy.  Skin:    General: Skin is warm and dry.     Findings: No rash.  Neurological:     General: No focal deficit present.     Mental Status: She is alert and oriented to person, place, and time. Mental status is at baseline.     Deep Tendon Reflexes:     Reflex Scores:      Patellar reflexes are 2+ on the right side and 2+ on the left side. Psychiatric:        Mood and Affect: Mood normal.        Behavior: Behavior normal.        Thought Content: Thought content normal.        Judgment: Judgment normal.     No results found.  Recent Results (from the past 2160 hours)  POCT glycosylated hemoglobin (Hb A1C)     Status: Abnormal   Collection Time: 11/20/23 10:58 AM  Result Value Ref Range   Hemoglobin A1C 5.9 (A) 4.0 - 5.6 %   HbA1c POC (<> result, manual entry)     HbA1c, POC (prediabetic range)     HbA1c, POC (controlled diabetic range)    Lipid panel     Status: None   Collection  Time: 11/20/23 11:24 AM  Result Value Ref Range   Cholesterol 168 <200 mg/dL   HDL 59 > OR = 50 mg/dL   Triglycerides 898 <849 mg/dL   LDL Cholesterol (Calc) 90 mg/dL (calc)    Comment: Reference range: <100 . Desirable range <100 mg/dL for primary prevention;   <70 mg/dL for patients with CHD or diabetic patients  with > or = 2 CHD risk factors. SABRA LDL-C is now calculated using the Martin-Hopkins  calculation, which is a validated novel method providing  better accuracy than the Friedewald equation in the  estimation of LDL-C.  Gladis APPLETHWAITE et al. SANDREA. 7986;689(80): 2061-2068  (http://education.[facility].com/faq/FAQ164)    Total CHOL/HDL Ratio 2.8 <5.0 (calc)   Non-HDL Cholesterol (Calc) 109 <130 mg/dL (calc)    Comment: For patients with diabetes plus 1 major ASCVD risk  factor, treating to a non-HDL-C goal of <100 mg/dL  (LDL-C of <29 mg/dL) is considered a therapeutic  option.   Microalbumin / creatinine urine ratio     Status: Abnormal   Collection Time: 11/20/23 11:24 AM  Result Value Ref Range   Creatinine,  Urine 70 20 - 275 mg/dL   Microalb, Ur 25.3 mg/dL    Comment: Verified by repeat analysis. SABRA Reference Range Not established    Microalb Creat Ratio 1,066 (H) <30 mg/g creat    Comment: . The ADA defines abnormalities in albumin excretion as follows: SABRA Albuminuria Category        Result (mg/g creatinine) . Normal to Mildly increased   <30 Moderately increased         30-299  Severely increased           > OR = 300 . The ADA recommends that at least two of three specimens collected within a 3-6 month period be abnormal before considering a patient to be within a diagnostic category.   Comp Met (CMET)     Status: Abnormal   Collection Time: 12/06/23  2:21 PM  Result Value Ref Range   Sodium 140 135 - 145 mEq/L   Potassium 4.3 3.5 - 5.1 mEq/L   Chloride 105 96 - 112 mEq/L   CO2 27 19 - 32 mEq/L    Comment: Elevated LDH levels may cause falsely  increased CO2 results. If LDH is >2000 U/L, a positive bias of 12% is possible.   Glucose, Bld 93 70 - 99 mg/dL   BUN 32 (H) 6 - 23 mg/dL   Creatinine, Ser 8.28 (H) 0.40 - 1.20 mg/dL   Total Bilirubin 0.7 0.2 - 1.2 mg/dL   Alkaline Phosphatase 49 39 - 117 U/L   AST 18 0 - 37 U/L   ALT 16 0 - 35 U/L   Total Protein 7.9 6.0 - 8.3 g/dL   Albumin 4.2 3.5 - 5.2 g/dL   GFR 69.93 (L) >39.99 mL/min    Comment: Calculated using the CKD-EPI Creatinine Equation (2021)   Calcium  9.6 8.4 - 10.5 mg/dL  Uric acid     Status: Abnormal   Collection Time: 12/06/23  2:21 PM  Result Value Ref Range   Uric Acid, Serum 8.9 (H) 2.4 - 7.0 mg/dL  Iron, TIBC and Ferritin Panel     Status: None   Collection Time: 12/06/23  2:21 PM  Result Value Ref Range   Iron 77 45 - 160 mcg/dL   TIBC 650 749 - 549 mcg/dL (calc)   %SAT 22 16 - 45 % (calc)   Ferritin 54 16 - 288 ng/mL  B12 and Folate Panel     Status: Abnormal   Collection Time: 12/06/23  2:21 PM  Result Value Ref Range   Vitamin B-12 >1500 (H) 211 - 911 pg/mL   Folate 18.0 >5.9 ng/mL  CBC with Differential/Platelet     Status: Abnormal   Collection Time: 12/06/23  2:21 PM  Result Value Ref Range   WBC 6.6 4.0 - 10.5 K/uL   RBC 3.95 3.87 - 5.11 Mil/uL   Hemoglobin 12.0 12.0 - 15.0 g/dL   HCT 64.1 (L) 63.9 - 53.9 %   MCV 90.5 78.0 - 100.0 fl   MCHC 33.4 30.0 - 36.0 g/dL   RDW 85.9 88.4 - 84.4 %   Platelets 220.0 150.0 - 400.0 K/uL   Neutrophils Relative % 50.1 43.0 - 77.0 %   Lymphocytes Relative 36.0 12.0 - 46.0 %   Monocytes Relative 9.3 3.0 - 12.0 %   Eosinophils Relative 3.7 0.0 - 5.0 %   Basophils Relative 0.9 0.0 - 3.0 %   Neutro Abs 3.3 1.4 - 7.7 K/uL   Lymphs Abs 2.4 0.7 - 4.0 K/uL  Monocytes Absolute 0.6 0.1 - 1.0 K/uL   Eosinophils Absolute 0.2 0.0 - 0.7 K/uL   Basophils Absolute 0.1 0.0 - 0.1 K/uL  HM DIABETES EYE EXAM     Status: None   Collection Time: 12/12/23 12:00 AM  Result Value Ref Range   HM Diabetic Eye Exam No  Retinopathy No Retinopathy  Cologuard     Status: None   Collection Time: 12/13/23 12:15 AM  Result Value Ref Range   COLOGUARD Sample Could Not Be Processed 3 N/A    Comment: The Cologuard (TM) test was assigned to this specimen.  The stool exceeds the allowable weight (300 grams). The patient will be contacted to initiate a new sample collection.  Cologuard     Status: None   Collection Time: 12/21/23  9:03 PM  Result Value Ref Range   COLOGUARD Negative Negative    Comment: The Cologuard (TM) test was performed on this specimen.  NEGATIVE TEST RESULT. A negative Cologuard result indicates a low likelihood that a colorectal cancer (CRC) or advanced adenoma (adenomatous polyps with more advanced pre-malignant features) is present. The chance that a person with a negative Cologuard test has a colorectal cancer is less than 1 in 1500 (negative predictive value >99.9%) or has an advanced adenoma is less than 5.3% (negative predictive value 94.7%). These data are based on a prospective cross-sectional study of 10,000 individuals at average risk for colorectal cancer who were screened with both Cologuard and colonoscopy. (Imperiale T. et al, N Engl J Med 2014;370(14):1286-1297) The normal value (reference range) for this assay is negative.  COLOGUARD RE-SCREENING RECOMMENDATION: Periodic colorectal cancer screening is an important part of preventive healthcare for asymptomatic individuals at average risk for colorectal cancer. Following a negative Cologuard  result, the American Cancer Society and U.S. Multi-Society Task Force screening guidelines recommend a Cologuard re-screening interval of 3 years.  References: American Cancer Society Guideline for Colorectal Cancer Screening: https://www.cancer.org/cancer/colon-rectal-cancer/detection-diagnosis-staging/acs-recommendations.html.; Rex DK, Boland CR, Dominitz JK, Colorectal Cancer Screening: Recommendations for Physicians and Patients from the U.S.  Multi-Society Task Force on Colorectal Cancer Screening , Am J Gastroenterology 2017; 112:1016-1030.  TEST DESCRIPTION: Composite algorithmic analysis of stool DNA-biomarkers with hemoglobin immunoassay.   Quantitative values of individual biomarkers are not reportable and are not associated with individual biomarker result reference ranges. Cologuard is intended for colorectal cancer screening of adults of either sex, 45 years or older, who are at average-risk for colorectal cancer (CRC). Cologuard has been approved for use by the U.S.  FDA. The performance of Cologuard was established in a cross sectional study of average-risk adults aged 41-84. Cologuard performance in patients ages 46 to 19 years was estimated by sub-group analysis of near-age groups. Colonoscopies performed for a positive result may find as the most clinically significant lesion: colorectal cancer [4.0%], advanced adenoma (including sessile serrated polyps greater than or equal to 1cm diameter) [20%] or non- advanced adenoma [31%]; or no colorectal neoplasia [45%]. These estimates are derived from a prospective cross-sectional screening study of 10,000 individuals at average risk for colorectal cancer who were screened with both Cologuard and colonoscopy. (Imperiale T. et al, LOISE [place] J Med 2014;370(14):1286-1297.) Cologuard may produce a false negative or false positive result (no colorectal cancer or precancerous polyp present at colonoscopy follow up). A negative Cologuard test result does not guarantee the absence of CRC or advanced adenoma  (pre-cancer). The current Cologuard screening interval is every 3 years. Science Writer and [place] Therapist, Music). Cologuard performance data in a  10,000 patient pivotal study using colonoscopy as the reference method can be accessed at the following location: www.exactlabs.com/results. Additional description of the Cologuard test process, warnings and precautions can be found at  www.cologuard.com.         Beverley KATHEE Hummer, MD  I,Emily Lagle,acting as a scribe for Beverley KATHEE Hummer, MD.,have documented all relevant documentation on the behalf of Beverley KATHEE Hummer, MD.  LILLETTE Beverley KATHEE Hummer, MD, have reviewed all documentation for this visit. The documentation on 01/10/2024 for the exam, diagnosis, procedures, and orders are all accurate and complete.     [1]  Current Outpatient Medications on File Prior to Visit  Medication Sig Dispense Refill   Accu-Chek FastClix Lancets MISC Use accu chek fastclix lancets to check blood sugar 2-3 times daily. DX:E11.65 300 each 1   acetaminophen  (TYLENOL ) 325 MG tablet Take 2 tablets (650 mg total) by mouth every 6 (six) hours as needed for mild pain, fever or headache.     albuterol  (VENTOLIN  HFA) 108 (90 Base) MCG/ACT inhaler Inhale 2 puffs into the lungs every 6 (six) hours as needed for wheezing or shortness of breath. 8 g 0   allopurinol  (ZYLOPRIM ) 100 MG tablet Take 0.5 tablets (50 mg total) by mouth every other day. 23 tablet 3   AREXVY 120 MCG/0.5ML injection      ascorbic acid  (VITAMIN C) 500 MG tablet Take 1 tablet (500 mg total) by mouth 3 (three) times daily. 90 tablet 0   Blood Glucose Monitoring Suppl (ACCU-CHEK GUIDE ME) w/Device KIT 1 each by Does not apply route 3 (three) times daily. Use accu chek guide me to check blood sugar 2-3 times daily. DX:E11.65 1 kit 0   ferrous sulfate  325 (65 FE) MG tablet Take 1 tablet (325 mg total) by mouth 3 (three) times daily with meals. 90 tablet 0   furosemide  (LASIX ) 40 MG tablet Take 1 tablet (40 mg total) by mouth daily. Takes Tues., Thurs., Sat. 90 tablet 5   glucose blood (ACCU-CHEK GUIDE TEST) test strip Use to test blood glucose before meals and at bedtime 300 each 12   glucose blood (ACCU-CHEK GUIDE) test strip USE TO CHECK BLOOD GLUCOSE THREE TO FOUR TIMES DAILY 300 each 3   hydrocortisone  valerate cream (WESTCORT ) 0.2 % APPLY A THIN COAT TO RASH DAILY AS NEEDED 45 g 0    Insulin  Lispro Prot & Lispro (HUMALOG  MIX 75/25 KWIKPEN) (75-25) 100 UNIT/ML Kwikpen INJECT 24 UNITS  BEFORE  DINNER 30 mL 3   Insulin  Pen Needle (BD PEN NEEDLE NANO 2ND GEN) 32G X 4 MM MISC USE 1  THREE TIMES DAILY 100 each 3   Insulin  Pen Needle (RELION PEN NEEDLES) 31G X 6 MM MISC USE 1  THREE TIMES DAILY 200 each 0   labetalol  (NORMODYNE ) 100 MG tablet Take 1 tablet (100 mg total) by mouth 2 (two) times daily. 60 tablet 3   potassium chloride  (KLOR-CON  M) 10 MEQ tablet Take 1 tablet (10 mEq total) by mouth daily. 90 tablet 0   rosuvastatin  (CRESTOR ) 40 MG tablet Take 1 tablet (40 mg total) by mouth daily. 90 tablet 3   Semaglutide , 2 MG/DOSE, 8 MG/3ML SOPN Inject 2 mg as directed once a week. 9 mL 4   TRESIBA  FLEXTOUCH 100 UNIT/ML FlexTouch Pen Inject 12 Units into the skin daily. 15 mL 3   No current facility-administered medications on file prior to visit.

## 2024-01-10 NOTE — Addendum Note (Signed)
 Addended by: SEBASTIAN RIGHTER B on: 01/10/2024 12:41 PM   Modules accepted: Orders

## 2024-01-10 NOTE — Patient Instructions (Addendum)
 It was very nice to see you today!  VISIT SUMMARY: During your visit, we addressed your persistent headaches and right-sided discomfort following a fall, managed your high blood pressure and chronic kidney disease, and reviewed your diabetes care.  YOUR PLAN: CHRONIC PAIN INVOLVING HEADACHE, RIGHT SHOULDER, NECK, AND MID-BACK: You have been experiencing chronic pain and headaches since your fall 10 months ago. The pain is intermittent and affects your right shoulder, neck, and mid-back. -We have ordered an MRI of your head to rule out any serious issues like a brain bleed. -We have also ordered x-rays of your spine, right shoulder, and chest to check for any structural problems. -For pain relief, you can use Tylenol  and Voltaren  gel. -I have provided you with exercises for home physical therapy. -If needed, we may refer you to sports medicine for a potential injection.  HYPERTENSIVE CHRONIC KIDNEY DISEASE STAGE 3: Your blood pressure remains high, which is affecting your kidney function. We are adjusting your medications to better manage your blood pressure while protecting your kidneys. -We have increased your valsartan  dosage. -We have started you on a low dose of indapamide  to help manage your blood pressure. -You will need to have blood work done in two weeks to monitor your kidney function.  TYPE 2 DIABETES MELLITUS WITH DIABETIC RETINOPATHY: Your blood sugar level was 116 mg/dL before taking your diabetes medication. You have an upcoming appointment with an eye doctor for your diabetic retinopathy and macular edema. -Continue with your current diabetes management plan. -Follow up with your eye doctor as scheduled.  GENERAL HEALTH MAINTENANCE: You have completed your colon cancer screening with [facility]. -No further action is needed at this time for colon cancer screening.    Take care, Arvella Hummer, MD, MS   PLEASE NOTE:  If you had any lab tests, please let us  know if you  have not heard back within a few days. You may see your results on mychart before we have a chance to review them but we will give you a call once they are reviewed by us .   If we ordered any referrals today, please let us  know if you have not heard from their office within the next week.   If you had any urgent prescriptions sent in today, please check with the pharmacy within an hour of our visit to make sure the prescription was transmitted appropriately.   Please try these tips to maintain a healthy lifestyle:  Eat at least 3 REAL meals and 1-2 snacks per day.  Aim for no more than 5 hours between eating.  If you eat breakfast, please do so within one hour of getting up.   Each meal should contain half fruits/vegetables, one quarter protein, and one quarter carbs (no bigger than a computer mouse)  Cut down on sweet beverages. This includes juice, soda, and sweet tea.   Drink at least 1 glass of water with each meal and aim for at least 8 glasses per day  Exercise at least 150 minutes every week.

## 2024-01-16 ENCOUNTER — Telehealth: Payer: Self-pay

## 2024-01-16 ENCOUNTER — Ambulatory Visit (HOSPITAL_COMMUNITY)
Admission: RE | Admit: 2024-01-16 | Discharge: 2024-01-16 | Disposition: A | Discharge status: Home / Self Care | Source: Ambulatory Visit | Attending: Family Medicine | Admitting: Family Medicine

## 2024-01-16 DIAGNOSIS — G44329 Chronic post-traumatic headache, not intractable: Secondary | ICD-10-CM | POA: Diagnosis present

## 2024-01-16 MED ORDER — GADOBUTROL 1 MMOL/ML IV SOLN
9.0000 mL | Freq: Once | INTRAVENOUS | Status: AC | PRN
  Administered 2024-01-16: 9 mL via INTRAVENOUS

## 2024-01-16 NOTE — Progress Notes (Signed)
 Complex Care Management Note  Care Guide Note 01/16/2024 Name: DANICIA TERHAAR MRN: [id number] DOB: [birth year]  SKYELAR HALLIDAY is a 68 y.o. year old female who sees Sebastian Beverley NOVAK, MD for primary care. I reached out to Channing LITTIE Agent by phone today to offer complex care management services.  Ms. Crihfield was given information about Complex Care Management services today including:   The Complex Care Management services include support from the care team which includes your Nurse Care Manager, Clinical Social Worker, or Pharmacist.  The Complex Care Management team is here to help remove barriers to the health concerns and goals most important to you. Complex Care Management services are voluntary, and the patient may decline or stop services at any time by request to their care team member.   Complex Care Management Consent Status: Patient did not agree to participate in complex care management services at this time.  Follow up plan:  Patient will follow up with PCP.  Encounter Outcome:  Patient Refused  Dreama Agent [facility] Assistant Direct Dial: [phone]  Fax: [phone]

## 2024-01-24 ENCOUNTER — Ambulatory Visit: Payer: Self-pay | Admitting: Family Medicine

## 2024-01-24 ENCOUNTER — Ambulatory Visit (HOSPITAL_COMMUNITY)

## 2024-01-26 ENCOUNTER — Encounter (HOSPITAL_BASED_OUTPATIENT_CLINIC_OR_DEPARTMENT_OTHER): Payer: Self-pay

## 2024-01-26 ENCOUNTER — Ambulatory Visit: Payer: Self-pay

## 2024-01-26 ENCOUNTER — Emergency Department (HOSPITAL_BASED_OUTPATIENT_CLINIC_OR_DEPARTMENT_OTHER)
Admission: EM | Admit: 2024-01-26 | Discharge: 2024-01-27 | Disposition: A | Discharge status: Other Acute Inpatient Hospital | Attending: Emergency Medicine | Admitting: Emergency Medicine

## 2024-01-26 ENCOUNTER — Other Ambulatory Visit: Payer: Self-pay

## 2024-01-26 ENCOUNTER — Emergency Department (HOSPITAL_BASED_OUTPATIENT_CLINIC_OR_DEPARTMENT_OTHER)

## 2024-01-26 DIAGNOSIS — N179 Acute kidney failure, unspecified: Secondary | ICD-10-CM | POA: Diagnosis not present

## 2024-01-26 DIAGNOSIS — R300 Dysuria: Secondary | ICD-10-CM | POA: Insufficient documentation

## 2024-01-26 DIAGNOSIS — E782 Mixed hyperlipidemia: Secondary | ICD-10-CM | POA: Insufficient documentation

## 2024-01-26 DIAGNOSIS — Z8739 Personal history of other diseases of the musculoskeletal system and connective tissue: Secondary | ICD-10-CM | POA: Insufficient documentation

## 2024-01-26 DIAGNOSIS — E119 Type 2 diabetes mellitus without complications: Secondary | ICD-10-CM | POA: Insufficient documentation

## 2024-01-26 DIAGNOSIS — E1159 Type 2 diabetes mellitus with other circulatory complications: Secondary | ICD-10-CM

## 2024-01-26 DIAGNOSIS — N189 Chronic kidney disease, unspecified: Secondary | ICD-10-CM | POA: Diagnosis present

## 2024-01-26 DIAGNOSIS — E790 Hyperuricemia without signs of inflammatory arthritis and tophaceous disease: Secondary | ICD-10-CM | POA: Insufficient documentation

## 2024-01-26 DIAGNOSIS — R42 Dizziness and giddiness: Secondary | ICD-10-CM | POA: Diagnosis present

## 2024-01-26 DIAGNOSIS — R0602 Shortness of breath: Secondary | ICD-10-CM | POA: Diagnosis present

## 2024-01-26 DIAGNOSIS — E1165 Type 2 diabetes mellitus with hyperglycemia: Secondary | ICD-10-CM | POA: Insufficient documentation

## 2024-01-26 DIAGNOSIS — I10 Essential (primary) hypertension: Secondary | ICD-10-CM | POA: Diagnosis not present

## 2024-01-26 DIAGNOSIS — E118 Type 2 diabetes mellitus with unspecified complications: Secondary | ICD-10-CM

## 2024-01-26 LAB — TROPONIN T, HIGH SENSITIVITY
Troponin T High Sensitivity: 25 ng/L — ABNORMAL HIGH (ref 0–19)
Troponin T High Sensitivity: 24 ng/L — ABNORMAL HIGH (ref 0–19)

## 2024-01-26 LAB — CBG MONITORING, ED: Glucose-Capillary: 172 mg/dL — ABNORMAL HIGH (ref 70–99)

## 2024-01-26 LAB — COMPREHENSIVE METABOLIC PANEL WITH GFR
ALT: 17 U/L (ref 0–44)
AST: 23 U/L (ref 15–41)
Albumin: 4.3 g/dL (ref 3.5–5.0)
Alkaline Phosphatase: 69 U/L (ref 38–126)
Anion gap: 15 (ref 5–15)
BUN: 69 mg/dL — ABNORMAL HIGH (ref 8–23)
CO2: 25 mmol/L (ref 22–32)
Calcium: 9.9 mg/dL (ref 8.9–10.3)
Chloride: 97 mmol/L — ABNORMAL LOW (ref 98–111)
Creatinine, Ser: 2.72 mg/dL — ABNORMAL HIGH (ref 0.44–1.00)
GFR, Estimated: 18 mL/min — ABNORMAL LOW
Glucose, Bld: 165 mg/dL — ABNORMAL HIGH (ref 70–99)
Potassium: 4.6 mmol/L (ref 3.5–5.1)
Sodium: 137 mmol/L (ref 135–145)
Total Bilirubin: 0.5 mg/dL (ref 0.0–1.2)
Total Protein: 7.8 g/dL (ref 6.5–8.1)

## 2024-01-26 LAB — URINALYSIS, MICROSCOPIC (REFLEX)

## 2024-01-26 LAB — URINALYSIS, ROUTINE W REFLEX MICROSCOPIC
Bilirubin Urine: NEGATIVE
Glucose, UA: NEGATIVE mg/dL
Ketones, ur: NEGATIVE mg/dL
Nitrite: NEGATIVE
Protein, ur: 100 mg/dL — AB
Specific Gravity, Urine: 1.015 (ref 1.005–1.030)
pH: 5.5 (ref 5.0–8.0)

## 2024-01-26 LAB — RESP PANEL BY RT-PCR (RSV, FLU A&B, COVID)  RVPGX2
Influenza A by PCR: NEGATIVE
Influenza B by PCR: NEGATIVE
Resp Syncytial Virus by PCR: NEGATIVE
SARS Coronavirus 2 by RT PCR: NEGATIVE

## 2024-01-26 LAB — CBC
HCT: 35 % — ABNORMAL LOW (ref 36.0–46.0)
Hemoglobin: 11.6 g/dL — ABNORMAL LOW (ref 12.0–15.0)
MCH: 30.1 pg (ref 26.0–34.0)
MCHC: 33.1 g/dL (ref 30.0–36.0)
MCV: 90.7 fL (ref 80.0–100.0)
Platelets: 230 K/uL (ref 150–400)
RBC: 3.86 MIL/uL — ABNORMAL LOW (ref 3.87–5.11)
RDW: 12.4 % (ref 11.5–15.5)
WBC: 9 K/uL (ref 4.0–10.5)
nRBC: 0 % (ref 0.0–0.2)

## 2024-01-26 NOTE — ED Triage Notes (Signed)
 Pt arrives with c/o fatigue that started about a week ago. Pt reports SOB and generalized weakness. Pt denies CP.

## 2024-01-26 NOTE — ED Provider Notes (Signed)
 " [place] [facility] Provider Note   CSN: [id number] Arrival date & time: 01/26/24  1827     History Chief Complaint  Patient presents with   Fatigue    HPI Stephanie [name] is a 69 y.o. female presenting for chief complaint of fatigue and weakness for a few days. States that her blood sugar dropped on the 28th after skipping a meal.  Since then she has had intermittent dizziness and fatigue.  Describes presyncope.  Patient's recorded medical, surgical, social, medication list and allergies were reviewed in the Snapshot window as part of the initial history.   Review of Systems   Review of Systems  Physical Exam Updated Vital Signs BP (!) 161/78   Pulse 82   Temp 97.9 F (36.6 C) (Oral)   Resp 18   Wt 93.4 kg   SpO2 98%   BMI 36.49 kg/m  Physical Exam   ED Course/ Medical Decision Making/ A&P    Procedures Procedures   Medications Ordered in ED Medications  lactated ringers  bolus 1,000 mL (0 mLs Intravenous Stopped 01/27/24 0149)  lactated ringers  bolus 1,000 mL (0 mLs Intravenous Stopped 01/27/24 0149)   Medical Decision Making:   Stephanie [name] is a 69 y.o. female who presented to the ED today with altered mental status detailed above.    Additional history discussed with patient's family/caregivers.  Patient placed on continuous vitals and telemetry monitoring while in ED which was reviewed periodically.  Complete initial physical exam performed, notably the patient  was HDS in NAD.    Reviewed and confirmed nursing documentation for past medical history, family history, social history.    Initial Assessment:   With the patient's presentation of altered mental status, most likely diagnosis is delerium 2/2 infectious etiology (UTI/CAP/URI) vs metabolic abnormality (Na/K/Mg/Ca) vs nonspecific etiology. Other diagnoses were considered including (but not limited to) CVA, ICH, intracranial mass, critical dehydration, heptatic  dysfunction, uremia, hypercarbia, intoxication, endrocrine abnormality, toxidrome. These are considered less likely due to history of present illness and physical exam findings.   This is most consistent with an acute life/limb threatening illness complicated by underlying chronic conditions.  Initial Plan:  Screening labs including CBC and Metabolic panel to evaluate for infectious or metabolic etiology of disease.  Urinalysis with reflex culture ordered to evaluate for UTI or relevant urologic/nephrologic pathology.  CXR to evaluate for structural/infectious intrathoracic pathology.  Objective evaluation as below reviewed   Initial Study Results:   Laboratory  All laboratory results reviewed without evidence of clinically relevant pathology.   Exceptions include: Worsening GFR/creatinine.  Creatinine went from 1.6-2.6 in just a month.  GFR dropped from 41-18.  2 L IV fluid administered and GFR only increased from 18-19  EKG EKG was reviewed independently. Rate, rhythm, axis, intervals all examined and without medically relevant abnormality. ST segments without concerns for elevations.    Radiology:  All images reviewed independently. Agree with radiology report at this time.   CT Renal Stone Study Result Date: 01/27/2024 CLINICAL DATA:  Flank pain EXAM: CT ABDOMEN AND PELVIS WITHOUT CONTRAST TECHNIQUE: Multidetector CT imaging of the abdomen and pelvis was performed following the standard protocol without IV contrast. RADIATION DOSE REDUCTION: This exam was performed according to the departmental dose-optimization program which includes automated exposure control, adjustment of the mA and/or kV according to patient size and/or use of iterative reconstruction technique. COMPARISON:  CT 10/22/2020 FINDINGS: Lower chest: Lung bases demonstrate no acute airspace disease. Hepatobiliary: No  focal liver abnormality is seen. No gallstones, gallbladder wall thickening, or biliary dilatation. Pancreas:  Unremarkable. No pancreatic ductal dilatation or surrounding inflammatory changes. Spleen: Normal in size without focal abnormality. Adrenals/Urinary Tract: None adrenal glands are normal. No hydronephrosis. Simple and non simple cystic lesions within the bilateral kidneys. The urinary bladder is unremarkable. Stomach/Bowel: Stomach within normal limits. No dilated small bowel. No acute bowel wall thickening. 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 Postsurgical changes of a segment of small bowel in the right anterior abdomen right with mild dilatation but no obstructive features. Vascular/Lymphatic: Aortic atherosclerosis. No enlarged abdominal or pelvic lymph nodes. Reproductive: Status post hysterectomy. No adnexal masses. Other: Negative for ascites or free air. Ventral wall laxity. Periumbilical ventral hernia containing mesenteric fat and small bowel but no incarceration or obstruction. Musculoskeletal: No acute or suspicious osseous abnormality. Multilevel degenerative changes IMPRESSION: 1. No CT evidence for acute intra-abdominal or pelvic abnormality. Negative for hydronephrosis or ureteral stone. 2. 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 3. Periumbilical ventral hernia containing mesenteric fat and small bowel but no incarceration or obstruction. 4. Simple and non simple cystic lesions within the bilateral kidneys incompletely assessed without contrast. When the patient is clinically stable and able to follow directions and hold their breath (preferably as an outpatient) further evaluation with dedicated abdominal MRI should be considered. 5. Aortic atherosclerosis. Aortic Atherosclerosis (ICD10-I70.0). Electronically Signed   By: Luke Bun M.D.   On: 01/27/2024 00:53   DG Chest 2 View Result Date: 01/26/2024 EXAM: 2 VIEW(S) XRAY OF THE CHEST 01/26/2024 07:34:00 PM COMPARISON: None available. CLINICAL HISTORY: SOB FINDINGS: LUNGS AND PLEURA: Mild  peribronchial thickening which may reflect bronchitis. No pleural effusion. No pneumothorax. HEART AND MEDIASTINUM: No acute abnormality of the cardiac and mediastinal silhouettes. BONES AND SOFT TISSUES: No acute osseous abnormality. IMPRESSION: 1. Mild peribronchial thickening, which may reflect bronchitis. Electronically signed by: Franky Crease MD 01/26/2024 07:43 PM EST RP Workstation: [id number]   DG Lumbar Spine Complete Result Date: 01/25/2024 EXAM: 4 VIEW(S) XRAY OF THE LUMBAR SPINE 01/10/2024 02:32:27 PM COMPARISON: None available. CLINICAL HISTORY: pain pain FINDINGS: LUMBAR SPINE: BONES: No radiographic evidence of fracture or pars defect. Alignment is maintained. Vertebral body heights are maintained. DISCS AND DEGENERATIVE CHANGES: Degenerative endplate osteophytes at multiple levels most pronounced at L2-L3. There is mild to moderate disc space narrowing at L2-L3. Mild disc space narrowing at L3-L4 and L4-L5. Facet arthrosis at multiple levels most pronounced in the lower lumbar spine. SOFT TISSUES: No acute abnormality. IMPRESSION: 1. No acute findings. 2. Degenerative changes as above. Electronically signed by: Donnice Mania MD 01/25/2024 09:23 PM EST RP Workstation: [id number]   DG Thoracic Spine W/Swimmers Result Date: 01/25/2024 EXAM: 3 VIEW(S) XRAY OF THE THORACIC SPINE 01/10/2024 02:32:27 PM COMPARISON: None available. CLINICAL HISTORY: pain FINDINGS: BONES: No acute radiographic abnormality of the thoracic spine. Vertebral body heights are maintained. Alignment is maintained. DISCS AND DEGENERATIVE CHANGES: There are degenerative endplate osteophytes throughout the thoracic spine with mild disc space narrowing at multiple levels. SOFT TISSUES: The visualized lungs are clear. IMPRESSION: 1. No acute radiographic abnormality of the thoracic spine. 2. Mild degenerative changes as above. Electronically signed by: Donnice Mania MD 01/25/2024 09:18 PM EST RP Workstation: [id number]   DG Cervical  Spine With Flex & Extend Result Date: 01/25/2024 EXAM: FLEXION AND EXTENSION (2) VIEW(S) XRAY OF THE CERVICAL SPINE 01/10/2024 02:32:27 PM COMPARISON: None available. CLINICAL HISTORY: pain pain FINDINGS: BONES: Vertebral body heights are maintained.  Alignment demonstrates straightening of the normal cervical lordosis. There is no significant listhesis on neutral images. On flexion images, there is 3 mm anterolisthesis of C3 on C4, an additional 4 mm anterolisthesis of C4 on C5, and trace anterolisthesis of C5 on C6. There is no significant shift in alignment on extension images. DISCS AND DEGENERATIVE CHANGES: Degenerative endplate osteophytes are most pronounced at C5-C6 and C6-C7. There is mild disc space narrowing at C5-C6 and C6-C7. Facet arthrosis and uncovertebral hypertrophy are present at multiple levels. There is somewhat limited evaluation of the foramina due to positioning. Within these limitations, there is at least mild foraminal stenosis on the left at C3-C4, moderate foraminal stenosis on the left at C4-C5, C5-C6, and C6-C7. There is mild foraminal stenosis on the right at C3-C4, moderate foraminal stenosis on the right at C4-C5 and C5-C6, and mild foraminal stenosis on the right at C6-C7. SOFT TISSUES: No prevertebral soft tissue swelling. The visualized lungs appear clear. IMPRESSION: 1. Dynamic anterolisthesis on flexion images, measuring 3 mm at C3-C4, 4 mm at C4-C5, and trace at C5-C6, without significant listhesis on neutral views and without significant change on extension views. 2. Moderate left foraminal stenosis at C4-C5, C5-C6, and C6-C7. 3. Moderate right foraminal stenosis at C4-C5 and C5-C6. Electronically signed by: Donnice Mania MD 01/25/2024 09:16 PM EST RP Workstation: [id number]   DG Shoulder Right Result Date: 01/23/2024 EXAM: 1 VIEW(S) XRAY OF THE _LATERALITY_ SHOULDER 01/10/2024 02:32:27 PM COMPARISON: None available. CLINICAL HISTORY: pain FINDINGS: BONES AND JOINTS: Mild  glenohumeral degenerative changes. Glenohumeral joint is normally aligned. No malalignment. No acute fracture. Moderate degenerative changes of the acromioclavicular joint with joint space narrowing and osteophyte formation. SOFT TISSUES: No abnormal calcifications. Visualized lung is unremarkable. IMPRESSION: 1. Moderate acromioclavicular and mild glenohumeral osteoarthritis. Electronically signed by: Donnice Mania MD 01/23/2024 11:19 PM EST RP Workstation: [id number]       Final Assessment and Plan:   Objective evaluation performed and shows no clear etiology of her AKI.  She did not respond to IV fluids and clinically still is weak fatigued and suffering from lethargy consistent with hyperuricemia. AKI over CKD warrants further evaluation.  Her CT renal study showed developing cysts on her kidneys.  Patient will be admitted to the hospital for further diagnostic workup management and evaluation by nephrology in AM.    Clinical Impression: No diagnosis found.   Data Unavailable   Final Clinical Impression(s) / ED Diagnoses Final diagnoses:  None    Rx / DC Orders ED Discharge Orders     None       "

## 2024-01-26 NOTE — ED Provider Notes (Incomplete)
 " [place] [facility] Provider Note   CSN: [id number] Arrival date & time: 01/26/24  1827     History Chief Complaint  Patient presents with   Fatigue    HPI Stephanie [name] is a 69 y.o. female presenting for chief complaint of fatigue and weakness for a few days. States that her blood sugar dropped on the 28th after skipping a meal.  Since then she has had intermittent dizziness and fatigue.  Describes presyncope.  Patient's recorded medical, surgical, social, medication list and allergies were reviewed in the Snapshot window as part of the initial history.   Review of Systems   Review of Systems  Physical Exam Updated Vital Signs BP (!) 152/78 (BP Location: Right Arm)   Pulse 88   Temp 97.9 F (36.6 C)   Resp 16   Wt 93.4 kg   SpO2 98%   BMI 36.49 kg/m  Physical Exam   ED Course/ Medical Decision Making/ A&P    Procedures Procedures   Medications Ordered in ED Medications - No data to display Medical Decision Making:   Stephanie [name] is a 69 y.o. female who presented to the ED today with altered mental status detailed above.    Additional history discussed with patient's family/caregivers.  Patient placed on continuous vitals and telemetry monitoring while in ED which was reviewed periodically.  Complete initial physical exam performed, notably the patient  was HDS in NAD.    Reviewed and confirmed nursing documentation for past medical history, family history, social history.    Initial Assessment:   With the patient's presentation of altered mental status, most likely diagnosis is delerium 2/2 infectious etiology (UTI/CAP/URI) vs metabolic abnormality (Na/K/Mg/Ca) vs nonspecific etiology. Other diagnoses were considered including (but not limited to) CVA, ICH, intracranial mass, critical dehydration, heptatic dysfunction, uremia, hypercarbia, intoxication, endrocrine abnormality, toxidrome. These are considered less likely  due to history of present illness and physical exam findings.   This is most consistent with an acute life/limb threatening illness complicated by underlying chronic conditions.  Initial Plan:  Screening labs including CBC and Metabolic panel to evaluate for infectious or metabolic etiology of disease.  Urinalysis with reflex culture ordered to evaluate for UTI or relevant urologic/nephrologic pathology.  CXR to evaluate for structural/infectious intrathoracic pathology.  Objective evaluation as below reviewed   Initial Study Results:   Laboratory  All laboratory results reviewed without evidence of clinically relevant pathology.   ***Exceptions include: ***   ***EKG EKG was reviewed independently. Rate, rhythm, axis, intervals all examined and without medically relevant abnormality. ST segments without concerns for elevations.    Radiology:  All images reviewed independently. ***Agree with radiology report at this time.   DG Chest 2 View Result Date: 01/26/2024 EXAM: 2 VIEW(S) XRAY OF THE CHEST 01/26/2024 07:34:00 PM COMPARISON: None available. CLINICAL HISTORY: SOB FINDINGS: LUNGS AND PLEURA: Mild peribronchial thickening which may reflect bronchitis. No pleural effusion. No pneumothorax. HEART AND MEDIASTINUM: No acute abnormality of the cardiac and mediastinal silhouettes. BONES AND SOFT TISSUES: No acute osseous abnormality. IMPRESSION: 1. Mild peribronchial thickening, which may reflect bronchitis. Electronically signed by: Franky Crease MD 01/26/2024 07:43 PM EST RP Workstation: [id number]   DG Lumbar Spine Complete Result Date: 01/25/2024 EXAM: 4 VIEW(S) XRAY OF THE LUMBAR SPINE 01/10/2024 02:32:27 PM COMPARISON: None available. CLINICAL HISTORY: pain pain FINDINGS: LUMBAR SPINE: BONES: No radiographic evidence of fracture or pars defect. Alignment is maintained. Vertebral body heights are maintained. DISCS AND DEGENERATIVE  CHANGES: Degenerative endplate osteophytes at multiple levels most  pronounced at L2-L3. There is mild to moderate disc space narrowing at L2-L3. Mild disc space narrowing at L3-L4 and L4-L5. Facet arthrosis at multiple levels most pronounced in the lower lumbar spine. SOFT TISSUES: No acute abnormality. IMPRESSION: 1. No acute findings. 2. Degenerative changes as above. Electronically signed by: Donnice Mania MD 01/25/2024 09:23 PM EST RP Workstation: [id number]   DG Thoracic Spine W/Swimmers Result Date: 01/25/2024 EXAM: 3 VIEW(S) XRAY OF THE THORACIC SPINE 01/10/2024 02:32:27 PM COMPARISON: None available. CLINICAL HISTORY: pain FINDINGS: BONES: No acute radiographic abnormality of the thoracic spine. Vertebral body heights are maintained. Alignment is maintained. DISCS AND DEGENERATIVE CHANGES: There are degenerative endplate osteophytes throughout the thoracic spine with mild disc space narrowing at multiple levels. SOFT TISSUES: The visualized lungs are clear. IMPRESSION: 1. No acute radiographic abnormality of the thoracic spine. 2. Mild degenerative changes as above. Electronically signed by: Donnice Mania MD 01/25/2024 09:18 PM EST RP Workstation: [id number]   DG Cervical Spine With Flex & Extend Result Date: 01/25/2024 EXAM: FLEXION AND EXTENSION (2) VIEW(S) XRAY OF THE CERVICAL SPINE 01/10/2024 02:32:27 PM COMPARISON: None available. CLINICAL HISTORY: pain pain FINDINGS: BONES: Vertebral body heights are maintained. Alignment demonstrates straightening of the normal cervical lordosis. There is no significant listhesis on neutral images. On flexion images, there is 3 mm anterolisthesis of C3 on C4, an additional 4 mm anterolisthesis of C4 on C5, and trace anterolisthesis of C5 on C6. There is no significant shift in alignment on extension images. DISCS AND DEGENERATIVE CHANGES: Degenerative endplate osteophytes are most pronounced at C5-C6 and C6-C7. There is mild disc space narrowing at C5-C6 and C6-C7. Facet arthrosis and uncovertebral hypertrophy are present at  multiple levels. There is somewhat limited evaluation of the foramina due to positioning. Within these limitations, there is at least mild foraminal stenosis on the left at C3-C4, moderate foraminal stenosis on the left at C4-C5, C5-C6, and C6-C7. There is mild foraminal stenosis on the right at C3-C4, moderate foraminal stenosis on the right at C4-C5 and C5-C6, and mild foraminal stenosis on the right at C6-C7. SOFT TISSUES: No prevertebral soft tissue swelling. The visualized lungs appear clear. IMPRESSION: 1. Dynamic anterolisthesis on flexion images, measuring 3 mm at C3-C4, 4 mm at C4-C5, and trace at C5-C6, without significant listhesis on neutral views and without significant change on extension views. 2. Moderate left foraminal stenosis at C4-C5, C5-C6, and C6-C7. 3. Moderate right foraminal stenosis at C4-C5 and C5-C6. Electronically signed by: Donnice Mania MD 01/25/2024 09:16 PM EST RP Workstation: [id number]   DG Shoulder Right Result Date: 01/23/2024 EXAM: 1 VIEW(S) XRAY OF THE _LATERALITY_ SHOULDER 01/10/2024 02:32:27 PM COMPARISON: None available. CLINICAL HISTORY: pain FINDINGS: BONES AND JOINTS: Mild glenohumeral degenerative changes. Glenohumeral joint is normally aligned. No malalignment. No acute fracture. Moderate degenerative changes of the acromioclavicular joint with joint space narrowing and osteophyte formation. SOFT TISSUES: No abnormal calcifications. Visualized lung is unremarkable. IMPRESSION: 1. Moderate acromioclavicular and mild glenohumeral osteoarthritis. Electronically signed by: Donnice Mania MD 01/23/2024 11:19 PM EST RP Workstation: [id number]      Consults: Case discussed with ***.   Final Assessment and Plan:   ***     Clinical Impression: No diagnosis found.   Data Unavailable   Final Clinical Impression(s) / ED Diagnoses Final diagnoses:  None    Rx / DC Orders ED Discharge Orders     None       "

## 2024-01-26 NOTE — Telephone Encounter (Signed)
 FYI Only or Action Required?: FYI only for provider: ED advised.  Patient was last seen in primary care on 01/10/2024 by Sebastian Beverley NOVAK, MD.  Called Nurse Triage reporting Dizziness and Fatigue.  Symptoms began a week ago.  Interventions attempted: Rest, hydration, or home remedies.  Symptoms are: unchanged.  Triage Disposition: Go to ED Now (or PCP Triage)  Patient/caregiver understands and will follow disposition?: Yes    Copied from CRM [id number]. Topic: Clinical - Red Word Triage >> Jan 26, 2024  5:15 PM Stephanie [name] wrote: [facility] that prompted transfer to Nurse Triage: dizzyness from dropped sugar Reason for Disposition  SEVERE dizziness (e.g., unable to stand, requires support to walk, feels like passing out now)  Answer Assessment - Initial Assessment Questions Blood sugar dropped on the 27th and since then has been feeling woozy. BP and BS have been fine since that day. She states yesterday bp was 143/93 before meds, after 124/71, today she was 111/69 before meds, bs was 127 and bp was 140/84 this afternoon after meds. She states when she stands she sees black dots, feels woozy, does have some shortness of breath and has to hold onto something to walk. RN advised pt to go to the ER to be evaluated, asked if she had someone to take her. PT states she does. Not all triage questions answered due to ED dispo.      1. DESCRIPTION: Describe your dizziness.     Feels like she is going to pass out 2. LIGHTHEADED: Do you feel lightheaded? (e.g., somewhat faint, woozy, weak upon standing)     yes 3. VERTIGO: Do you feel like either you or the room is spinning or tilting? (i.e., vertigo)     no 4. SEVERITY: How bad is it?  Do you feel like you are going to faint? Can you stand and walk?     She has to old onto something and does feel like she is going to pass out 5. ONSET:  When did the dizziness begin?     01/20/24 6. AGGRAVATING FACTORS: Does anything make it  worse? (e.g., standing, change in head position)     Standing makes it worse 7. HEART RATE: Can you tell me your heart rate? How many beats in 15 seconds?  (Note: Not all patients can do this.)       Hr has been 70-80'[name].  8. CAUSE: What do you think is causing the dizziness? (e.g., decreased fluids or food, diarrhea, emotional distress, heat exposure, new medicine, sudden standing, vomiting; unknown)      9. RECURRENT SYMPTOM: Have you had dizziness before? If Yes, ask: When was the last time? What happened that time?      10. OTHER SYMPTOMS: Do you have any other symptoms? (e.g., fever, chest pain, vomiting, diarrhea, bleeding) Shortness of breath.  Protocols used: Dizziness - Lightheadedness-A-AH

## 2024-01-27 ENCOUNTER — Observation Stay
Admission: RE | Admit: 2024-01-27 | Discharge: 2024-01-28 | Disposition: A | Discharge status: Home / Self Care | Source: Ambulatory Visit | Attending: Obstetrics and Gynecology | Admitting: Obstetrics and Gynecology

## 2024-01-27 ENCOUNTER — Encounter (HOSPITAL_BASED_OUTPATIENT_CLINIC_OR_DEPARTMENT_OTHER): Payer: Self-pay | Admitting: Internal Medicine

## 2024-01-27 ENCOUNTER — Encounter (HOSPITAL_COMMUNITY): Payer: Self-pay

## 2024-01-27 ENCOUNTER — Emergency Department (HOSPITAL_BASED_OUTPATIENT_CLINIC_OR_DEPARTMENT_OTHER)

## 2024-01-27 DIAGNOSIS — J45909 Unspecified asthma, uncomplicated: Secondary | ICD-10-CM | POA: Diagnosis not present

## 2024-01-27 DIAGNOSIS — I10 Essential (primary) hypertension: Secondary | ICD-10-CM | POA: Diagnosis present

## 2024-01-27 DIAGNOSIS — Z794 Long term (current) use of insulin: Secondary | ICD-10-CM | POA: Diagnosis not present

## 2024-01-27 DIAGNOSIS — Z79899 Other long term (current) drug therapy: Secondary | ICD-10-CM | POA: Diagnosis not present

## 2024-01-27 DIAGNOSIS — R5383 Other fatigue: Secondary | ICD-10-CM | POA: Diagnosis present

## 2024-01-27 DIAGNOSIS — E785 Hyperlipidemia, unspecified: Secondary | ICD-10-CM | POA: Diagnosis not present

## 2024-01-27 DIAGNOSIS — I129 Hypertensive chronic kidney disease with stage 1 through stage 4 chronic kidney disease, or unspecified chronic kidney disease: Secondary | ICD-10-CM | POA: Diagnosis not present

## 2024-01-27 DIAGNOSIS — Z743 Need for continuous supervision: Secondary | ICD-10-CM | POA: Diagnosis not present

## 2024-01-27 DIAGNOSIS — D631 Anemia in chronic kidney disease: Secondary | ICD-10-CM | POA: Diagnosis not present

## 2024-01-27 DIAGNOSIS — E1122 Type 2 diabetes mellitus with diabetic chronic kidney disease: Secondary | ICD-10-CM | POA: Diagnosis not present

## 2024-01-27 DIAGNOSIS — R0902 Hypoxemia: Secondary | ICD-10-CM | POA: Diagnosis not present

## 2024-01-27 DIAGNOSIS — N179 Acute kidney failure, unspecified: Principal | ICD-10-CM

## 2024-01-27 DIAGNOSIS — N189 Chronic kidney disease, unspecified: Secondary | ICD-10-CM | POA: Diagnosis present

## 2024-01-27 DIAGNOSIS — R6889 Other general symptoms and signs: Secondary | ICD-10-CM | POA: Diagnosis not present

## 2024-01-27 DIAGNOSIS — R531 Weakness: Secondary | ICD-10-CM | POA: Diagnosis not present

## 2024-01-27 DIAGNOSIS — E1129 Type 2 diabetes mellitus with other diabetic kidney complication: Secondary | ICD-10-CM | POA: Diagnosis present

## 2024-01-27 DIAGNOSIS — N184 Chronic kidney disease, stage 4 (severe): Secondary | ICD-10-CM | POA: Diagnosis present

## 2024-01-27 DIAGNOSIS — I639 Cerebral infarction, unspecified: Principal | ICD-10-CM | POA: Insufficient documentation

## 2024-01-27 DIAGNOSIS — E1159 Type 2 diabetes mellitus with other circulatory complications: Secondary | ICD-10-CM

## 2024-01-27 LAB — CBC
HCT: 31.8 % — ABNORMAL LOW (ref 36.0–46.0)
HCT: 31.1 % — ABNORMAL LOW (ref 36.0–46.0)
Hemoglobin: 10.6 g/dL — ABNORMAL LOW (ref 12.0–15.0)
Hemoglobin: 10.3 g/dL — ABNORMAL LOW (ref 12.0–15.0)
MCH: 30.1 pg (ref 26.0–34.0)
MCH: 29.9 pg (ref 26.0–34.0)
MCHC: 33.3 g/dL (ref 30.0–36.0)
MCHC: 33.1 g/dL (ref 30.0–36.0)
MCV: 90.3 fL (ref 80.0–100.0)
MCV: 90.4 fL (ref 80.0–100.0)
Platelets: 192 K/uL (ref 150–400)
Platelets: 215 K/uL (ref 150–400)
RBC: 3.52 MIL/uL — ABNORMAL LOW (ref 3.87–5.11)
RBC: 3.44 MIL/uL — ABNORMAL LOW (ref 3.87–5.11)
RDW: 12.6 % (ref 11.5–15.5)
RDW: 12.3 % (ref 11.5–15.5)
WBC: 8.4 K/uL (ref 4.0–10.5)
WBC: 8 K/uL (ref 4.0–10.5)
nRBC: 0 % (ref 0.0–0.2)
nRBC: 0 % (ref 0.0–0.2)

## 2024-01-27 LAB — CBG MONITORING, ED
Glucose-Capillary: 97 mg/dL (ref 70–99)
Glucose-Capillary: 95 mg/dL (ref 70–99)

## 2024-01-27 LAB — BASIC METABOLIC PANEL WITH GFR
Anion gap: 10 (ref 5–15)
Anion gap: 12 (ref 5–15)
Anion gap: 13 (ref 5–15)
BUN: 64 mg/dL — ABNORMAL HIGH (ref 8–23)
BUN: 68 mg/dL — ABNORMAL HIGH (ref 8–23)
BUN: 71 mg/dL — ABNORMAL HIGH (ref 8–23)
CO2: 26 mmol/L (ref 22–32)
CO2: 26 mmol/L (ref 22–32)
CO2: 27 mmol/L (ref 22–32)
Calcium: 10 mg/dL (ref 8.9–10.3)
Calcium: 9.5 mg/dL (ref 8.9–10.3)
Calcium: 9.6 mg/dL (ref 8.9–10.3)
Chloride: 102 mmol/L (ref 98–111)
Chloride: 103 mmol/L (ref 98–111)
Chloride: 99 mmol/L (ref 98–111)
Creatinine, Ser: 2.58 mg/dL — ABNORMAL HIGH (ref 0.44–1.00)
Creatinine, Ser: 2.63 mg/dL — ABNORMAL HIGH (ref 0.44–1.00)
Creatinine, Ser: 2.74 mg/dL — ABNORMAL HIGH (ref 0.44–1.00)
GFR, Estimated: 18 mL/min — ABNORMAL LOW
GFR, Estimated: 19 mL/min — ABNORMAL LOW
GFR, Estimated: 19 mL/min — ABNORMAL LOW
Glucose, Bld: 103 mg/dL — ABNORMAL HIGH (ref 70–99)
Glucose, Bld: 83 mg/dL (ref 70–99)
Glucose, Bld: 90 mg/dL (ref 70–99)
Potassium: 4.1 mmol/L (ref 3.5–5.1)
Potassium: 4.7 mmol/L (ref 3.5–5.1)
Potassium: 5.1 mmol/L (ref 3.5–5.1)
Sodium: 139 mmol/L (ref 135–145)
Sodium: 139 mmol/L (ref 135–145)
Sodium: 141 mmol/L (ref 135–145)

## 2024-01-27 LAB — CREATININE, SERUM
Creatinine, Ser: 2.17 mg/dL — ABNORMAL HIGH (ref 0.44–1.00)
GFR, Estimated: 24 mL/min — ABNORMAL LOW

## 2024-01-27 LAB — GLUCOSE, CAPILLARY
Glucose-Capillary: 90 mg/dL (ref 70–99)
Glucose-Capillary: 118 mg/dL — ABNORMAL HIGH (ref 70–99)

## 2024-01-27 MED ORDER — SODIUM CHLORIDE 0.9 % IV SOLN
1.0000 g | INTRAVENOUS | Status: DC
  Administered 2024-01-27: 1 g via INTRAVENOUS
  Filled 2024-01-27: qty 10

## 2024-01-27 MED ORDER — OXYCODONE HCL 5 MG PO TABS: 5.0000 mg | ORAL_TABLET | ORAL | Status: DC | PRN

## 2024-01-27 MED ORDER — LACTATED RINGERS IV SOLN: INTRAVENOUS | Status: DC

## 2024-01-27 MED ORDER — LACTATED RINGERS IV BOLUS
1000.0000 mL | Freq: Once | INTRAVENOUS | Status: AC
  Administered 2024-01-27: 1000 mL via INTRAVENOUS

## 2024-01-27 MED ORDER — LABETALOL HCL 100 MG PO TABS
100.0000 mg | ORAL_TABLET | Freq: Two times a day (BID) | ORAL | Status: DC
  Administered 2024-01-27: 100 mg via ORAL
  Filled 2024-01-27: qty 1

## 2024-01-27 MED ORDER — INSULIN ASPART 100 UNIT/ML IJ SOLN
0.0000 [IU] | Freq: Three times a day (TID) | INTRAMUSCULAR | Status: DC
  Administered 2024-01-28: 2 [IU] via SUBCUTANEOUS
  Filled 2024-01-27: qty 2

## 2024-01-27 MED ORDER — INSULIN ASPART 100 UNIT/ML IJ SOLN: 0.0000 [IU] | Freq: Every day | INTRAMUSCULAR | Status: DC

## 2024-01-27 MED ORDER — HYDRALAZINE HCL 20 MG/ML IJ SOLN
10.0000 mg | Freq: Four times a day (QID) | INTRAMUSCULAR | Status: DC | PRN
  Filled 2024-01-27: qty 1

## 2024-01-27 MED ORDER — ROSUVASTATIN CALCIUM 40 MG PO TABS
40.0000 mg | ORAL_TABLET | Freq: Every day | ORAL | Status: DC
  Filled 2024-01-27: qty 1

## 2024-01-27 MED ORDER — LABETALOL HCL 100 MG PO TABS
100.0000 mg | ORAL_TABLET | Freq: Once | ORAL | Status: AC
  Administered 2024-01-27: 100 mg via ORAL
  Filled 2024-01-27: qty 1

## 2024-01-27 MED ORDER — ALLOPURINOL 100 MG PO TABS
50.0000 mg | ORAL_TABLET | ORAL | Status: DC
  Administered 2024-01-27: 50 mg via ORAL
  Filled 2024-01-27: qty 1

## 2024-01-27 MED ORDER — ACETAMINOPHEN 325 MG PO TABS
650.0000 mg | ORAL_TABLET | Freq: Four times a day (QID) | ORAL | Status: DC | PRN
  Administered 2024-01-28: 650 mg via ORAL
  Filled 2024-01-27: qty 2

## 2024-01-27 MED ORDER — INSULIN ASPART 100 UNIT/ML IJ SOLN: 0.0000 [IU] | Freq: Three times a day (TID) | INTRAMUSCULAR | Status: DC

## 2024-01-27 MED ORDER — ACETAMINOPHEN 650 MG RE SUPP: 650.0000 mg | Freq: Four times a day (QID) | RECTAL | Status: DC | PRN

## 2024-01-27 MED ORDER — INSULIN GLARGINE-YFGN 100 UNIT/ML ~~LOC~~ SOLN: 12.0000 [IU] | Freq: Every evening | SUBCUTANEOUS | Status: DC

## 2024-01-27 MED ORDER — ONDANSETRON HCL 4 MG PO TABS: 4.0000 mg | ORAL_TABLET | Freq: Four times a day (QID) | ORAL | Status: DC | PRN

## 2024-01-27 MED ORDER — LABETALOL HCL 100 MG PO TABS
100.0000 mg | ORAL_TABLET | Freq: Two times a day (BID) | ORAL | Status: DC
  Administered 2024-01-27 – 2024-01-28 (×2): 100 mg via ORAL
  Filled 2024-01-27 (×2): qty 1

## 2024-01-27 MED ORDER — FERROUS SULFATE 325 (65 FE) MG PO TABS
325.0000 mg | ORAL_TABLET | Freq: Three times a day (TID) | ORAL | Status: DC
  Administered 2024-01-27: 325 mg via ORAL
  Filled 2024-01-27: qty 1

## 2024-01-27 MED ORDER — ONDANSETRON HCL 4 MG/2ML IJ SOLN: 4.0000 mg | Freq: Four times a day (QID) | INTRAMUSCULAR | Status: DC | PRN

## 2024-01-27 MED ORDER — ALBUTEROL SULFATE (2.5 MG/3ML) 0.083% IN NEBU: 3.0000 mL | INHALATION_SOLUTION | Freq: Four times a day (QID) | RESPIRATORY_TRACT | Status: DC | PRN

## 2024-01-27 MED ORDER — ROSUVASTATIN CALCIUM 20 MG PO TABS
10.0000 mg | ORAL_TABLET | Freq: Every day | ORAL | Status: DC
  Administered 2024-01-27 – 2024-01-28 (×2): 10 mg via ORAL
  Filled 2024-01-27 (×2): qty 1

## 2024-01-27 MED ORDER — INDAPAMIDE 1.25 MG PO TABS
1.2500 mg | ORAL_TABLET | Freq: Every day | ORAL | Status: DC
  Filled 2024-01-27: qty 1

## 2024-01-27 MED ORDER — HEPARIN SODIUM (PORCINE) 5000 UNIT/ML IJ SOLN
5000.0000 [IU] | Freq: Three times a day (TID) | INTRAMUSCULAR | Status: DC
  Administered 2024-01-27 – 2024-01-28 (×3): 5000 [IU] via SUBCUTANEOUS
  Filled 2024-01-27 (×3): qty 1

## 2024-01-27 MED ORDER — VITAMIN C 500 MG PO TABS
500.0000 mg | ORAL_TABLET | Freq: Every day | ORAL | Status: DC
  Administered 2024-01-27: 500 mg via ORAL
  Filled 2024-01-27: qty 1

## 2024-01-27 MED ORDER — SODIUM CHLORIDE 0.9 % IV SOLN: INTRAVENOUS | Status: DC

## 2024-01-27 MED ORDER — POLYETHYLENE GLYCOL 3350 17 G PO PACK
17.0000 g | PACK | Freq: Every day | ORAL | Status: DC | PRN
  Administered 2024-01-28: 17 g via ORAL
  Filled 2024-01-27: qty 1

## 2024-01-27 NOTE — H&P (Signed)
 "  History and Physical    Stephanie [name] FMW:[id number] DOB: Oct 30, 1955 DOA: 01/27/2024  DOS: the patient was seen and examined on 01/27/2024  PCP: Sebastian Beverley NOVAK, MD   Patient coming from: Home  I have personally briefly reviewed patient's old medical records in [facility]  Chief Complaint: Fatigue for 1 week  HPI: Stephanie [name] is a pleasant 69 y.o. female with medical history significant for CKD 3B, HTN, DM, chronic pain syndrome, morbid obesity, gout, iron deficiency anemia who presented to ED at [facility] on 01/27/24 for generalized fatigue for 1 week duration with associated shortness of breath.  Patient reported that her blood sugar dropped on 01/21/2024 after skipping meals.  She also complained of some intermittent dizziness and fatigue.  Patient reported that she was feeling like passing out but never passed out.  At presentation to ED at [facility] patient was found to be hypertensive otherwise hemodynamically stable.  Lab work, BMP showing elevated creatinine of 2.63 and BUN of 68, low GFR at 19 otherwise unremarkable.  CBC unremarkable, normal hepatic function panel.  Flat troponin 25 and 24.  UA showing evidence of UTI.  EKG showed normal sinus rhythm at 85 bpm.  CT renal study showed no acute intra-abdominal or pelvic abnormality.  ED Course: Upon arrival to the ED, patient is found to have AKI.  Patient received 2 L of LR bolus.  Patient was negative for COVID/influenza/RSV.  Hospital service was consulted for management of AKI on CKD 3B, hypertensive crisis and elevated troponin secondary to demand ischemia.  Patient was evaluated today by hospitalist service virtually for admission but there was no beds at [facility] and was long and patient was transferred over to [facility]. Review of Systems:  ROS  All other systems negative except as noted in the HPI.  Past Medical History:  Diagnosis Date   Abnormal Pap smear     Asthma    Diabetes mellitus    Hyperlipidemia    Hypertension    LGSIL (low grade squamous intraepithelial dysplasia)     Past Surgical History:  Procedure Laterality Date   ABDOMINAL HYSTERECTOMY     bladder surgery     BOWEL RESECTION N/A 10/23/2020   Procedure: SMALL BOWEL RESECTION;  Surgeon: Stevie, Herlene Beverley, MD;  Location: [facility] OR;  Service: General;  Laterality: N/A;   COLPOSCOPY     INCISIONAL HERNIA REPAIR N/A 10/23/2020   Procedure: OPEN HERNIA REPAIR INCISIONAL WITH MESH;  Surgeon: Stevie Herlene Beverley, MD;  Location: [facility] OR;  Service: General;  Laterality: N/A;   INSERTION OF MESH N/A 10/23/2020   Procedure: INSERTION OF MESH;  Surgeon: Stevie Herlene Beverley, MD;  Location: [facility] OR;  Service: General;  Laterality: N/A;   LAPAROTOMY N/A 10/23/2020   Procedure: EXPLORATORY LAPAROTOMY;  Surgeon: Stevie Herlene Beverley, MD;  Location: [facility] OR;  Service: General;  Laterality: N/A;     reports that she has never smoked. She has never used smokeless tobacco. She reports that she does not drink alcohol and does not use drugs.  Allergies[1]  Family History  Problem Relation Age of Onset   Diabetes Mother    Diabetes Maternal Grandmother    Heart disease Neg Hx    Hypertension Neg Hx     Prior to Admission medications  Medication Sig Start Date End Date Taking? Authorizing Provider  Accu-Chek FastClix Lancets MISC Use accu chek fastclix lancets to check blood sugar 2-3  times daily. DX:E11.65 01/30/19   Von Pacific, MD  acetaminophen  (TYLENOL ) 325 MG tablet Take 2 tablets (650 mg total) by mouth every 6 (six) hours as needed for mild pain, fever or headache. 10/31/20   Vicci Burnard SAUNDERS, PA-C  albuterol  (VENTOLIN  HFA) 108 (90 Base) MCG/ACT inhaler Inhale 2 puffs into the lungs every 6 (six) hours as needed for wheezing or shortness of breath. 12/02/21   Berneta Elsie Sayre, MD  allopurinol  (ZYLOPRIM ) 100 MG tablet Take 0.5 tablets (50 mg total) by mouth every other day. 12/06/23  11/30/24  Sebastian Beverley NOVAK, MD  AREXVY 120 MCG/0.5ML injection  06/23/23   [provider]  ascorbic acid  (VITAMIN C ) 500 MG tablet Take 1 tablet (500 mg total) by mouth 3 (three) times daily. 10/31/20   Vicci Burnard SAUNDERS, PA-C  Blood Glucose Monitoring Suppl (ACCU-CHEK GUIDE ME) w/Device KIT 1 each by Does not apply route 3 (three) times daily. Use accu chek guide me to check blood sugar 2-3 times daily. DX:E11.65 01/30/19   Von [place], MD  diclofenac  Sodium (VOLTAREN ) 1 % GEL Apply 4 g topically 4 (four) times daily as needed. 01/10/24   Sebastian Beverley NOVAK, MD  ferrous sulfate  325 (65 FE) MG tablet Take 1 tablet (325 mg total) by mouth 3 (three) times daily with meals. 10/31/20   Vicci Burnard SAUNDERS, PA-C  furosemide  (LASIX ) 40 MG tablet Take 1 tablet (40 mg total) by mouth daily. Takes Tues., Thurs., Sat. 07/17/23   Berneta Elsie Sayre, MD  glucose blood (ACCU-CHEK GUIDE TEST) test strip Use to test blood glucose before meals and at bedtime 06/21/23   Thapa, Sudan, MD  glucose blood (ACCU-CHEK GUIDE) test strip USE TO CHECK BLOOD GLUCOSE THREE TO FOUR TIMES DAILY 04/13/22   Von Pacific, MD  hydrocortisone  valerate cream (WESTCORT ) 0.2 % APPLY A THIN COAT TO RASH DAILY AS NEEDED 07/27/23   Berneta Elsie Sayre, MD  indapamide  (LOZOL ) 1.25 MG tablet Take 1 tablet (1.25 mg total) by mouth daily. 01/10/24 01/04/25  Sebastian Beverley NOVAK, MD  insulin  aspart protamine- aspart (NOVOLOG  MIX 70/30) (70-30) 100 UNIT/ML injection 10 units in the am and 48 units in the pm Subcutaneous twice a day    [provider]  Insulin  Lispro Prot & Lispro (HUMALOG  MIX 75/25 KWIKPEN) (75-25) 100 UNIT/ML Kwikpen INJECT 24 UNITS  BEFORE  DINNER 11/20/23   Thapa, Sudan, MD  Insulin  Pen Needle (BD PEN NEEDLE NANO 2ND GEN) 32G X 4 MM MISC USE 1  THREE TIMES DAILY 03/08/23   Thapa, Sudan, MD  Insulin  Pen Needle (RELION PEN NEEDLES) 31G X 6 MM MISC USE 1  THREE TIMES DAILY 10/02/20   Von Pacific, MD  labetalol  (NORMODYNE ) 100  MG tablet Take 1 tablet (100 mg total) by mouth 2 (two) times daily. 12/13/22   Thapa, Sudan, MD  metFORMIN  (GLUCOPHAGE ) 1000 MG tablet 1 tablet Orally once a day    [provider]  potassium chloride  (KLOR-CON  M) 10 MEQ tablet Take 1 tablet (10 mEq total) by mouth daily. 12/13/22   Thapa, Sudan, MD  rosuvastatin  (CRESTOR ) 40 MG tablet Take 1 tablet (40 mg total) by mouth daily. 07/12/23   Thapa, Sudan, MD  Semaglutide , 2 MG/DOSE, 8 MG/3ML SOPN Inject 2 mg as directed once a week. 07/12/23   Thapa, Sudan, MD  TRESIBA  FLEXTOUCH 100 UNIT/ML FlexTouch Pen Inject 12 Units into the skin daily. 07/12/23   Thapa, Sudan, MD  valsartan  (DIOVAN ) 320 MG tablet Take 1 tablet (320  mg total) by mouth daily. 01/10/24 01/04/25  Sebastian Beverley NOVAK, MD    Physical Exam: There were no vitals filed for this visit.  Physical Exam   Constitutional: Alert, awake, calm, comfortable HEENT: Neck supple Respiratory: Clear to auscultation B/L, no wheezing, no rales.  Cardiovascular: Regular rate and rhythm, no murmurs / rubs / gallops. No extremity edema. 2+ pedal pulses. No carotid bruits.  Abdomen: Soft, no tenderness, Bowel sounds positive.  Obese abdomen Musculoskeletal: no clubbing / cyanosis. Good ROM, no contractures. Normal muscle tone.  Skin: no rashes, lesions, ulcers. Neurologic: CN 2-12 grossly intact. Sensation intact, No focal deficit identified Psychiatric: Alert and oriented x 3. Normal mood.    Labs on Admission: I have personally reviewed following labs and imaging studies  CBC: Recent Labs  Lab 01/26/24 1902 01/27/24 0605  WBC 9.0 8.4  HGB 11.6* 10.6*  HCT 35.0* 31.8*  MCV 90.7 90.3  PLT 230 192   Basic Metabolic Panel: Recent Labs  Lab 01/26/24 1902 01/27/24 0014 01/27/24 0141 01/27/24 0605  NA 137 139 139 141  K 4.6 4.7 5.1 4.1  CL 97* 99 102 103  CO2 25 26 27 26   GLUCOSE 165* 90 83 103*  BUN 69* 71* 68* 64*  CREATININE 2.72* 2.74* 2.63* 2.58*  CALCIUM  9.9 10.0 9.5  9.6   GFR: Estimated Creatinine Clearance: 22 mL/min (A) (by C-G formula based on SCr of 2.58 mg/dL (H)). Liver Function Tests: Recent Labs  Lab 01/26/24 1902  AST 23  ALT 17  ALKPHOS 69  BILITOT 0.5  PROT 7.8  ALBUMIN 4.3   No results for input(s): LIPASE, AMYLASE in the last 168 hours. No results for input(s): AMMONIA in the last 168 hours. Coagulation Profile: No results for input(s): INR, PROTIME in the last 168 hours. Cardiac Enzymes: No results for input(s): CKTOTAL, CKMB, CKMBINDEX, TROPONINI, TROPONINIHS in the last 168 hours. BNP (last 3 results) No results for input(s): BNP in the last 8760 hours. HbA1C: No results for input(s): HGBA1C in the last 72 hours. CBG: Recent Labs  Lab 01/26/24 1858 01/27/24 0814 01/27/24 1123  GLUCAP 172* 97 95   Lipid Profile: No results for input(s): CHOL, HDL, LDLCALC, TRIG, CHOLHDL, LDLDIRECT in the last 72 hours. Thyroid  Function Tests: No results for input(s): TSH, T4TOTAL, FREET4, T3FREE, THYROIDAB in the last 72 hours. Anemia Panel: No results for input(s): VITAMINB12, FOLATE, FERRITIN, TIBC, IRON, RETICCTPCT in the last 72 hours. Urine analysis:    Component Value Date/Time   COLORURINE YELLOW 01/26/2024 2134   APPEARANCEUR CLEAR 01/26/2024 2134   LABSPEC 1.015 01/26/2024 2134   PHURINE 5.5 01/26/2024 2134   GLUCOSEU NEGATIVE 01/26/2024 2134   GLUCOSEU >=1000 (A) 12/05/2016 1602   HGBUR SMALL (A) 01/26/2024 2134   BILIRUBINUR NEGATIVE 01/26/2024 2134   KETONESUR NEGATIVE 01/26/2024 2134   PROTEINUR 100 (A) 01/26/2024 2134   UROBILINOGEN 1.0 12/05/2016 1602   NITRITE NEGATIVE 01/26/2024 2134   LEUKOCYTESUR TRACE (A) 01/26/2024 2134    Radiological Exams on Admission: I have personally reviewed images CT Renal Stone Study Result Date: 01/27/2024 CLINICAL DATA:  Flank pain EXAM: CT ABDOMEN AND PELVIS WITHOUT CONTRAST TECHNIQUE: Multidetector CT imaging of the  abdomen and pelvis was performed following the standard protocol without IV contrast. RADIATION DOSE REDUCTION: This exam was performed according to the departmental dose-optimization program which includes automated exposure control, adjustment of the mA and/or kV according to patient size and/or use of iterative reconstruction technique. COMPARISON:  CT 10/22/2020 FINDINGS: Lower chest:  Lung bases demonstrate no acute airspace disease. Hepatobiliary: No focal liver abnormality is seen. No gallstones, gallbladder wall thickening, or biliary dilatation. Pancreas: Unremarkable. No pancreatic ductal dilatation or surrounding inflammatory changes. Spleen: Normal in size without focal abnormality. Adrenals/Urinary Tract: None adrenal glands are normal. No hydronephrosis. Simple and non simple cystic lesions within the bilateral kidneys. The urinary bladder is unremarkable. Stomach/Bowel: Stomach within normal limits. No dilated small bowel. No acute bowel wall thickening. 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 Postsurgical changes of a segment of small bowel in the right anterior abdomen right with mild dilatation but no obstructive features. Vascular/Lymphatic: Aortic atherosclerosis. No enlarged abdominal or pelvic lymph nodes. Reproductive: Status post hysterectomy. No adnexal masses. Other: Negative for ascites or free air. Ventral wall laxity. Periumbilical ventral hernia containing mesenteric fat and small bowel but no incarceration or obstruction. Musculoskeletal: No acute or suspicious osseous abnormality. Multilevel degenerative changes IMPRESSION: 1. No CT evidence for acute intra-abdominal or pelvic abnormality. Negative for hydronephrosis or ureteral stone. 2. 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 3. Periumbilical ventral hernia containing mesenteric fat and small bowel but no incarceration or obstruction. 4. Simple and non simple cystic lesions within the  bilateral kidneys incompletely assessed without contrast. When the patient is clinically stable and able to follow directions and hold their breath (preferably as an outpatient) further evaluation with dedicated abdominal MRI should be considered. 5. Aortic atherosclerosis. Aortic Atherosclerosis (ICD10-I70.0). Electronically Signed   By: Luke Bun M.D.   On: 01/27/2024 00:53   DG Chest 2 View Result Date: 01/26/2024 EXAM: 2 VIEW(S) XRAY OF THE CHEST 01/26/2024 07:34:00 PM COMPARISON: None available. CLINICAL HISTORY: SOB FINDINGS: LUNGS AND PLEURA: Mild peribronchial thickening which may reflect bronchitis. No pleural effusion. No pneumothorax. HEART AND MEDIASTINUM: No acute abnormality of the cardiac and mediastinal silhouettes. BONES AND SOFT TISSUES: No acute osseous abnormality. IMPRESSION: 1. Mild peribronchial thickening, which may reflect bronchitis. Electronically signed by: Franky Crease MD 01/26/2024 07:43 PM EST RP Workstation: [id number]    EKG: My personal interpretation of EKG shows: Normal sinus rhythm, no ST elevation    Assessment/Plan Principal Problem:   AKI (acute kidney injury) Active Problems:   Type II diabetes mellitus with renal manifestations (HCC)   HTN (hypertension)   HLD (hyperlipidemia)   Stage 4 chronic kidney disease (HCC)   Anemia due to stage 4 chronic kidney disease (HCC)   Morbid (severe) obesity due to excess calories (HCC)    Assessment and Plan: 69 year old female W/PMH of diabetes on insulin , CKD stage IV/3B, HTN, HLD, anemia, obesity who came into [facility] for fatigue and abnormal kidney function.  1.  AKI on CKD 3B - Patient was feeling fatigued and had 1 episode of hypoglycemia. - This could be prerenal due to patient was not eating or drinking well last week. - She will be given gentle hydration 75 cc/h and monitor her kidney function. - Patient is sent that she is urinating well. - CT scan was done which showed no obstruction in the renal  system. - Continue to monitor input and output charting, will check postvoid residual, monitor kidney function.  2.  Type 2 diabetes - Continue on insulin  sliding scale - Patient has multiple regimen but I believe that she was not sure about the medication regimen. - Given her 1 episode of hypoglycemia it would be prudent to only continue insulin  sliding scale and adjust insulin  as needed.  3.  HTN - Will continue to monitor  blood pressure - Systolic blood pressure was 170 - Will avoid nephrotoxic and blood pressure medications. - Continue hydralazine  as needed for systolic blood pressure more than 160 - Continue labetalol  p.o. 100 twice daily - Depending on the blood pressure situation we may need to add further blood pressure medications.  4.  HLD - Continue Crestor  renally dosed  5.  Anemia of kidney disease - Continue to monitor hemoglobin and hematocrit      DVT prophylaxis: SQ Heparin  Code Status: Full Code Family Communication: None Disposition Plan: Home Consults called: None Admission status: Observation, Med-Surg   Nena Rebel, MD [facility] 01/27/2024, 3:07 PM       [1]  Allergies Allergen Reactions   Amlodipine  Swelling    Leg swelling   Avapro  [Irbesartan ] Other (See Comments)    headaches   Codeine Nausea And Vomiting   Dulaglutide  Nausea And Vomiting   Lisinopril  Itching   "

## 2024-01-27 NOTE — ED Provider Notes (Signed)
" °  [place] EMERGENCY DEPARTMENT AT [facility] [place] Provider Note  Bed became available for patient at [facility], excepted by Dr.  Keshab Paudel, patient evaluated, amenable to this transfer, part 1 of EMTALA completed.  Will complete part 4 on arrival of transport.  [facility] arrived for transport, patient reevaluated, requested to use bathroom otherwise continues to be stable, part 4 of EMTALA completed.   Rogelia Jerilynn RAMAN, MD 01/27/24 1227  "

## 2024-01-27 NOTE — Plan of Care (Addendum)
 [name] is a 69 year old female with history of CKD stage IIIb, hypertension, insulin -dependent diabetes mellitus type 2, chronic pain syndrome, morbid obesity, gout, iron deficiency anemia.  01/26/2024: Patient presented to the emergency department for chief concerns of dizziness, fatigue, poor p.o. intake since 01/20/2024.  1/3 early morning: Patient accepted to hospitalist service for chief concerns of acute kidney injury suspect secondary to poor p.o. intake.  01/27/2024 late morning: I have been assigned to this patient as a virtual admitted.  I did a virtual assessment of the patient.  Patient will need admission, MedSurg for acute kidney injury.  Patient has agreed to being transferred to [facility] given bed holds lists is prolonged at [facility] and [place] [facility].  Labs reviewed: Serum sodium 141, potassium 4.1, chloride 103, bicarb 26, BUN of 64, serum creatinine of 2.58, eGFR of 19, nonfasting blood glucose 103, WBC 8.4, hemoglobin 10.6, platelets of 192.  COVID/influenza A/influenza B/RSV PCR were negative.  # Acute kidney injury suspect secondary to prerenal in setting of poor p.o. intake since January 20, 2024.  Patient is status post LR 2 L bolus.  LR infusion at 75 mL/h has been ordered.    # Pyuria with possible urinary tract infection-ceftriaxone  1 g IV daily initiated by cross coverage provider overnight.  Discussed with central capacity management for transfer to [facility], MedSurg bed.

## 2024-01-27 NOTE — Plan of Care (Signed)
 [facility] telemetry bed transfer:   69 year old female past medical history of CKD 3B, essential hypertension, insulin -dependent DM type II, chronic pain syndrome, morbid obesity, gout, and iron deficiency anemia presents emergency department complaining of generalized fatigue for 1 week with associated shortness of breath.  Patient reported that blood sugar dropped on 12/28 after skipping meal.  Also had intermittent dizziness and fatigue.  Patient reported presyncopal episode denies any syncope.  At presentation to ED patient found borderline hypertensive otherwise hemodynamically stable.  Lab work, BMP showing elevated creatinine 2.63 and BUN 68 low GFR 19 otherwise unremarkable.  CBC unremarkable.  Normal hepatic function panel. Flat troponin 24 and 25. UA showing evidence of UTI.  EKG showed normal sinus rhythm heart rate 85.  CT renal study no acute intra-abdominal or pelvic abnormality. IMPRESSION: 1. No CT evidence for acute intra-abdominal or pelvic abnormality. Negative for hydronephrosis or ureteral stone. 2. 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 3. Periumbilical ventral hernia containing mesenteric fat and small bowel but no incarceration or obstruction. 4. Simple and non simple cystic lesions within the bilateral kidneys incompletely assessed without contrast. When the patient is clinically stable and able to follow directions and hold their breath (preferably as an outpatient) further evaluation with dedicated abdominal MRI should be considered. 5. Aortic atherosclerosis.   In the ED patient received 2 L of LR bolus.  Hospitalist consulted for further evaluation management of AKI on CKD stage IIIb, hypertensive crisis and elevated troponin secondary to demand ischemia.   [facility] will assume care on arrival to accepting facility. Until arrival, care as per EDP. However, [facility] available 24/7 for questions and assistance. Check  [email or web address] for on-call coverage. Nursing staff, please call [facility] Admits & Consults System-Wide number under Amion on patient's arrival so appropriate admitting provider can evaluate the pt.   Author: Nattaly Yebra, MD  [facility]

## 2024-01-28 DIAGNOSIS — I2510 Atherosclerotic heart disease of native coronary artery without angina pectoris: Secondary | ICD-10-CM | POA: Insufficient documentation

## 2024-01-28 DIAGNOSIS — I639 Cerebral infarction, unspecified: Principal | ICD-10-CM | POA: Insufficient documentation

## 2024-01-28 DIAGNOSIS — N179 Acute kidney failure, unspecified: Secondary | ICD-10-CM | POA: Diagnosis not present

## 2024-01-28 LAB — CBC
HCT: 31.4 % — ABNORMAL LOW (ref 36.0–46.0)
Hemoglobin: 10.2 g/dL — ABNORMAL LOW (ref 12.0–15.0)
MCH: 29.7 pg (ref 26.0–34.0)
MCHC: 32.5 g/dL (ref 30.0–36.0)
MCV: 91.3 fL (ref 80.0–100.0)
Platelets: 201 K/uL (ref 150–400)
RBC: 3.44 MIL/uL — ABNORMAL LOW (ref 3.87–5.11)
RDW: 12.3 % (ref 11.5–15.5)
WBC: 7.3 K/uL (ref 4.0–10.5)
nRBC: 0 % (ref 0.0–0.2)

## 2024-01-28 LAB — URINE CULTURE: Special Requests: NORMAL

## 2024-01-28 LAB — GLUCOSE, CAPILLARY
Glucose-Capillary: 110 mg/dL — ABNORMAL HIGH (ref 70–99)
Glucose-Capillary: 156 mg/dL — ABNORMAL HIGH (ref 70–99)

## 2024-01-28 LAB — COMPREHENSIVE METABOLIC PANEL WITH GFR
ALT: 17 U/L (ref 0–44)
AST: 20 U/L (ref 15–41)
Albumin: 3.9 g/dL (ref 3.5–5.0)
Alkaline Phosphatase: 51 U/L (ref 38–126)
Anion gap: 12 (ref 5–15)
BUN: 53 mg/dL — ABNORMAL HIGH (ref 8–23)
CO2: 24 mmol/L (ref 22–32)
Calcium: 9.7 mg/dL (ref 8.9–10.3)
Chloride: 103 mmol/L (ref 98–111)
Creatinine, Ser: 2.18 mg/dL — ABNORMAL HIGH (ref 0.44–1.00)
GFR, Estimated: 24 mL/min — ABNORMAL LOW
Glucose, Bld: 123 mg/dL — ABNORMAL HIGH (ref 70–99)
Potassium: 4.1 mmol/L (ref 3.5–5.1)
Sodium: 139 mmol/L (ref 135–145)
Total Bilirubin: 0.6 mg/dL (ref 0.0–1.2)
Total Protein: 7.1 g/dL (ref 6.5–8.1)

## 2024-01-28 LAB — HIV ANTIBODY (ROUTINE TESTING W REFLEX): HIV Screen 4th Generation wRfx: NONREACTIVE

## 2024-01-28 LAB — PROTIME-INR
INR: 1.1 (ref 0.8–1.2)
Prothrombin Time: 14.4 s (ref 11.4–15.2)

## 2024-01-28 LAB — MICROALBUMIN / CREATININE URINE RATIO
Creatinine, Urine: 89.6 mg/dL
Microalb Creat Ratio: 701 mg/g{creat} — ABNORMAL HIGH (ref 0–29)
Microalb, Ur: 628.3 ug/mL — ABNORMAL HIGH

## 2024-01-28 MED ORDER — ASPIRIN 81 MG PO TBEC: 81.0000 mg | DELAYED_RELEASE_TABLET | Freq: Every day | ORAL | 11 refills | Status: AC

## 2024-01-28 MED ORDER — VALSARTAN 320 MG PO TABS: 160.0000 mg | ORAL_TABLET | Freq: Every day | ORAL | Status: DC

## 2024-01-28 NOTE — Plan of Care (Signed)

## 2024-01-28 NOTE — Plan of Care (Signed)

## 2024-01-28 NOTE — Evaluation (Signed)
 Physical Therapy Evaluation Patient Details Name: Stephanie [name] MRN: [id number] DOB: 12-Dec-1955 Today's Date: 01/28/2024  History of Present Illness  Stephanie [name] is a pleasant 69 y.o. female with medical history significant for CKD 3B, HTN, DM, chronic pain syndrome, morbid obesity, gout, iron deficiency anemia who presented to ED at [facility] on 01/27/24 for generalized fatigue for 1 week duration with associated shortness of breath.  Clinical Impression  Patient noted to be in supine position at PT arrival in room, for an initial PT evaluation due to a decline in functional status, with baseline mobility reported as independent, and currently requiring supervision/modI with IV pole for hallway ambulation distance of 180' feet. Pt expresses having midl lightheadedness that is not worsened by ambulating. The patient is A&O x 4, presenting with good willingness to work with PT. The patient resides in a house and lives with family with family/friend support. There are no STE inside the residence.  The overall clinical impression is that the patient presents with moderate mobility limitations. Recommended skilled PT will address safety, mobility, and discharge planning.         If plan is discharge home, recommend the following: A little help with walking and/or transfers   Can travel by private vehicle        Equipment Recommendations None recommended by PT  Recommendations for Other Services       Functional Status Assessment Patient has had a recent decline in their functional status and demonstrates the ability to make significant improvements in function in a reasonable and predictable amount of time.     Precautions / Restrictions Restrictions Weight Bearing Restrictions Per Provider Order: No      Mobility  Bed Mobility Overal bed mobility: Modified Independent                  Transfers Overall transfer level: Modified independent Equipment used: 1  person hand held assist                    Ambulation/Gait Ambulation/Gait assistance: Supervision, Modified independent (Device/Increase time) Gait Distance (Feet): 180 Feet Assistive device: IV Pole Gait Pattern/deviations: Step-through pattern, Narrow base of support, Trendelenburg Gait velocity: decreased        Stairs            Wheelchair Mobility     Tilt Bed    Modified Rankin (Stroke Patients Only)       Balance Overall balance assessment: Modified Independent                                           Pertinent Vitals/Pain Pain Assessment Pain Assessment: No/denies pain    Home Living Family/patient expects to be discharged to:: Private residence Living Arrangements: Other relatives Available Help at Discharge: Family;Available 24 hours/day Type of Home: House Home Access: Level entry       Home Layout: One level Home Equipment: None      Prior Function Prior Level of Function : Independent/Modified Independent             Mobility Comments: indenpendent ADLs Comments: independent     Extremity/Trunk Assessment   Upper Extremity Assessment Upper Extremity Assessment: Overall WFL for tasks assessed    Lower Extremity Assessment Lower Extremity Assessment: Generalized weakness    Cervical / Trunk Assessment Cervical / Trunk Assessment: Normal  Communication  Communication Communication: No apparent difficulties    Cognition Arousal: Alert Behavior During Therapy: WFL for tasks assessed/performed   PT - Cognitive impairments: No apparent impairments                         Following commands: Intact       Cueing Cueing Techniques: Verbal cues     General Comments      Exercises     Assessment/Plan    PT Assessment Patient needs continued PT services  PT Problem List Decreased balance       PT Treatment Interventions Gait training;Stair training;Balance  training;Neuromuscular re-education;Functional mobility training    PT Goals (Current goals can be found in the Care Plan section)  Acute Rehab PT Goals Patient Stated Goal: pt wants to gohome PT Goal Formulation: With patient Time For Goal Achievement: 02/11/24 Potential to Achieve Goals: Good    Frequency Min 1X/week     Co-evaluation               AM-PAC PT 6 Clicks Mobility  Outcome Measure Help needed turning from your back to your side while in a flat bed without using bedrails?: None Help needed moving from lying on your back to sitting on the side of a flat bed without using bedrails?: None Help needed moving to and from a bed to a chair (including a wheelchair)?: None Help needed standing up from a chair using your arms (e.g., wheelchair or bedside chair)?: None Help needed to walk in hospital room?: None Help needed climbing 3-5 steps with a railing? : A Little 6 Click Score: 23    End of Session   Activity Tolerance: Patient tolerated treatment well Patient left: in bed;with call bell/phone within reach Nurse Communication: Mobility status PT Visit Diagnosis: Unsteadiness on feet (R26.81)    Time: 8864-8854 PT Time Calculation (min) (ACUTE ONLY): 10 min   Charges:   PT Evaluation $PT Eval Low Complexity: 1 Low   PT General Charges $$ ACUTE PT VISIT: 1 Visit         Sherlean Lesches DPT, PT    Bralen Wiltgen A Naysa Puskas 01/28/2024, 12:03 PM

## 2024-01-28 NOTE — Discharge Summary (Signed)
 Stephanie [name] FMW:[id number] DOB: [birth year] DOA: 01/27/2024  PCP: Sebastian Beverley NOVAK, MD  Admit date: 01/27/2024 Discharge date: 01/28/2024  Time spent: 35 minutes  Recommendations for Outpatient Follow-up:  Pcp f/u 1 week, check bmp then Neurology f/u (referred to dr. Maree with [facility])     Discharge Diagnoses:  Principal Problem:   AKI (acute kidney injury) Active Problems:   Type II diabetes mellitus with renal manifestations (HCC)   HTN (hypertension)   HLD (hyperlipidemia)   Stage 4 chronic kidney disease (HCC)   Anemia due to stage 4 chronic kidney disease (HCC)   Morbid (severe) obesity due to excess calories (HCC)   Cerebellar stroke (HCC)   Discharge Condition: stable  Diet recommendation: heart healthy carb modified  There were no vitals filed for this visit.  History of present illness:  Stephanie [name] is a pleasant 69 y.o. female with medical history significant for CKD 3B, HTN, DM, chronic pain syndrome, morbid obesity, gout, iron deficiency anemia who presented to ED at [facility] on 01/27/24 for generalized fatigue for 1 week duration with associated shortness of breath.  Patient reported that her blood sugar dropped on 01/21/2024 after skipping meals.  She also complained of some intermittent dizziness and fatigue.  Patient reported that she was feeling like passing out but never passed out.  At presentation to ED at [facility] patient was found to be hypertensive otherwise hemodynamically stable.  Lab work, BMP showing elevated creatinine of 2.63 and BUN of 68, low GFR at 19 otherwise unremarkable.  CBC unremarkable, normal hepatic function panel.  Flat troponin 25 and 24.  UA showing evidence of UTI.  EKG showed normal sinus rhythm at 85 bpm.  CT renal study showed no acute intra-abdominal or pelvic abnormality.   Hospital Course:   Patient presents referred by pcp for ongoing unsteadiness/vertigo. Found to have AKI on ckd 3/4.  Recent vomiting that has resolved. AKI improved with fluids and patient is tolerating PO. Recent addition of indapamide  and up-titration of valsartan , will have patient hold indapamide  and decrease dose of valsartan  at discharge. In terms of patient's chronic vertigo symptoms, pcp ordered MRI that was performed earlier this month but not yet red. Requested STAT read from radiology and it is significant for evidence of prior cerebellar stroke. Very likely this at least partially explains patient's imbalance. Was evaluated by PT here and cleared for discharge with home health, which we have ordered. For this prior stroke, will start aspirin . Is already on max dose rosuvastatin . Will refer to neurology  Procedures: none   Consultations: none  Discharge Exam: Vitals:   01/28/24 0341 01/28/24 0823  BP: 119/69 (!) 123/56  Pulse: 80 75  Resp: 17 17  Temp: 98.3 F (36.8 C) 98.6 F (37 C)  SpO2: 96% 98%    General: NAD Cardiovascular: RRR Respiratory: CTAB  Discharge Instructions   Discharge Instructions     Ambulatory referral to Neurology   Complete by: As directed    Increase activity slowly   Complete by: As directed       Allergies as of 01/28/2024       Reactions   Amlodipine  Swelling   Leg swelling   Avapro  [irbesartan ] Other (See Comments)   headaches   Codeine Nausea And Vomiting   Dulaglutide  Nausea And Vomiting   Lisinopril  Itching   Saxagliptin-metformin  Er Other (See Comments)   Sitagliptin Phos-metformin  Hcl Other (See Comments)  Medication List     PAUSE taking these medications    indapamide  1.25 MG tablet Wait to take this until your doctor or other care provider tells you to start again. Commonly known as: LOZOL  Take 1 tablet (1.25 mg total) by mouth daily.       STOP taking these medications    Arexvy 120 MCG/0.5ML injection Generic drug: RSV vaccine recomb adjuvanted   metFORMIN  1000 MG tablet Commonly known as: GLUCOPHAGE     NovoLOG  Mix 70/30 (70-30) 100 UNIT/ML injection Generic drug: insulin  aspart protamine- aspart       TAKE these medications    Accu-Chek FastClix Lancets Misc Use accu chek fastclix lancets to check blood sugar 2-3 times daily. DX:E11.65   Accu-Chek Guide Me w/Device Kit 1 each by Does not apply route 3 (three) times daily. Use accu chek guide me to check blood sugar 2-3 times daily. DX:E11.65   Accu-Chek Guide test strip Generic drug: glucose blood USE TO CHECK BLOOD GLUCOSE THREE TO FOUR TIMES DAILY   Accu-Chek Guide Test test strip Generic drug: glucose blood Use to test blood glucose before meals and at bedtime   acetaminophen  325 MG tablet Commonly known as: TYLENOL  Take 2 tablets (650 mg total) by mouth every 6 (six) hours as needed for mild pain, fever or headache.   albuterol  108 (90 Base) MCG/ACT inhaler Commonly known as: VENTOLIN  HFA Inhale 2 puffs into the lungs every 6 (six) hours as needed for wheezing or shortness of breath.   allopurinol  100 MG tablet Commonly known as: ZYLOPRIM  Take 0.5 tablets (50 mg total) by mouth every other day. What changed: Another medication with the same name was removed. Continue taking this medication, and follow the directions you see here.   ascorbic acid  500 MG tablet Commonly known as: VITAMIN C  Take 1 tablet (500 mg total) by mouth 3 (three) times daily.   aspirin  EC 81 MG tablet Take 1 tablet (81 mg total) by mouth daily. Swallow whole.   diclofenac  Sodium 1 % Gel Commonly known as: Voltaren  Apply 4 g topically 4 (four) times daily as needed.   ferrous sulfate  325 (65 FE) MG tablet Take 1 tablet (325 mg total) by mouth 3 (three) times daily with meals.   furosemide  40 MG tablet Commonly known as: LASIX  Take 1 tablet (40 mg total) by mouth daily. Takes Tues., Thurs., Sat.   hydrocortisone  valerate cream 0.2 % Commonly known as: WESTCORT  APPLY A THIN COAT TO RASH DAILY AS NEEDED   Insulin  Lispro Prot & Lispro  (75-25) 100 UNIT/ML Kwikpen Commonly known as: HumaLOG  Mix 75/25 KwikPen INJECT 24 UNITS  BEFORE  DINNER   labetalol  100 MG tablet Commonly known as: NORMODYNE  Take 1 tablet (100 mg total) by mouth 2 (two) times daily.   potassium chloride  10 MEQ tablet Commonly known as: KLOR-CON  M Take 1 tablet (10 mEq total) by mouth daily.   ReliOn Pen Needles 31G X 6 MM Misc Generic drug: Insulin  Pen Needle USE 1  THREE TIMES DAILY   BD Pen Needle Nano 2nd Gen 32G X 4 MM Misc Generic drug: Insulin  Pen Needle USE 1  THREE TIMES DAILY   rosuvastatin  40 MG tablet Commonly known as: CRESTOR  Take 1 tablet (40 mg total) by mouth daily.   Semaglutide  (2 MG/DOSE) 8 MG/3ML Sopn Inject 2 mg as directed once a week.   Tresiba  FlexTouch 100 UNIT/ML FlexTouch Pen Generic drug: insulin  degludec Inject 12 Units into the skin daily.   valsartan  320  MG tablet Commonly known as: DIOVAN  Take 0.5 tablets (160 mg total) by mouth daily. What changed: how much to take       Allergies[1]  Follow-up Information     Sebastian Beverley NOVAK, MD Follow up.   Specialty: Family Medicine Why: 1 week Contact information: [address] [place] [address] [phone]         Maree Jannett POUR, MD Follow up.   Specialty: Neurology Why: this is the local neurologist we have referred you to Contact information: [address] [facility] [place] [address] [phone]                  The results of significant diagnostics from this hospitalization (including imaging, microbiology, ancillary and laboratory) are listed below for reference.    Significant Diagnostic Studies: MR BRAIN W WO CONTRAST Result Date: 01/28/2024 EXAM: MRI BRAIN WITH AND WITHOUT CONTRAST 01/16/2024 12:27:55 PM TECHNIQUE: Multiplanar multisequence MRI of the head/brain was performed with and without the administration of intravenous contrast. CONTRAST: 9 mL of Gadavist . COMPARISON:  Head CT 01/02/2014. CLINICAL HISTORY: Headache, new onset (Age >= 51y), Chronic pain involving headache, right shoulder, neck, and mid-back with right sided thoracic outlet syndrome; nausea and vomiting. FINDINGS: BRAIN AND VENTRICLES: There is no evidence of an acute infarct, intracranial hemorrhage, mass, midline shift, hydrocephalus, or extra-axial fluid collection. Patchy T2 hyperintensities in the cerebral white matter bilaterally are nonspecific but compatible with mild to moderate chronic small vessel ischemic disease. There is a chronic lacunar infarct versus dilated perivascular space in the right thalamus. A small chronic right cerebellar infarct is noted. No abnormal enhancement is identified. Major intracranial vascular flow voids are preserved. ORBITS: No acute abnormality. SINUSES: No acute abnormality. BONES AND SOFT TISSUES: Normal bone marrow signal and enhancement. No acute soft tissue abnormality. IMPRESSION: 1. No acute intracranial abnormality. 2. Mild to moderate chronic small vessel ischemic disease. 3. Small chronic right cerebellar infarct. Electronically signed by: Dasie Hamburg MD 01/28/2024 12:07 PM EST RP Workstation: [id number]   CT Renal Stone Study Result Date: 01/27/2024 CLINICAL DATA:  Flank pain EXAM: CT ABDOMEN AND PELVIS WITHOUT CONTRAST TECHNIQUE: Multidetector CT imaging of the abdomen and pelvis was performed following the standard protocol without IV contrast. RADIATION DOSE REDUCTION: This exam was performed according to the departmental dose-optimization program which includes automated exposure control, adjustment of the mA and/or kV according to patient size and/or use of iterative reconstruction technique. COMPARISON:  CT 10/22/2020 FINDINGS: Lower chest: Lung bases demonstrate no acute airspace disease. Hepatobiliary: No focal liver abnormality is seen. No gallstones, gallbladder wall thickening, or biliary dilatation. Pancreas: Unremarkable. No pancreatic ductal  dilatation or surrounding inflammatory changes. Spleen: Normal in size without focal abnormality. Adrenals/Urinary Tract: None adrenal glands are normal. No hydronephrosis. Simple and non simple cystic lesions within the bilateral kidneys. The urinary bladder is unremarkable. Stomach/Bowel: Stomach within normal limits. No dilated small bowel. No acute bowel wall thickening. 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 Postsurgical changes of a segment of small bowel in the right anterior abdomen right with mild dilatation but no obstructive features. Vascular/Lymphatic: Aortic atherosclerosis. No enlarged abdominal or pelvic lymph nodes. Reproductive: Status post hysterectomy. No adnexal masses. Other: Negative for ascites or free air. Ventral wall laxity. Periumbilical ventral hernia containing mesenteric fat and small bowel but no incarceration or obstruction. Musculoskeletal: No acute or suspicious osseous abnormality. Multilevel degenerative changes IMPRESSION: 1. No CT evidence for acute intra-abdominal or pelvic abnormality. Negative  for hydronephrosis or ureteral stone. 2. 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 3. Periumbilical ventral hernia containing mesenteric fat and small bowel but no incarceration or obstruction. 4. Simple and non simple cystic lesions within the bilateral kidneys incompletely assessed without contrast. When the patient is clinically stable and able to follow directions and hold their breath (preferably as an outpatient) further evaluation with dedicated abdominal MRI should be considered. 5. Aortic atherosclerosis. Aortic Atherosclerosis (ICD10-I70.0). Electronically Signed   By: Luke Bun M.D.   On: 01/27/2024 00:53   DG Chest 2 View Result Date: 01/26/2024 EXAM: 2 VIEW(S) XRAY OF THE CHEST 01/26/2024 07:34:00 PM COMPARISON: None available. CLINICAL HISTORY: SOB FINDINGS: LUNGS AND PLEURA: Mild peribronchial thickening which may  reflect bronchitis. No pleural effusion. No pneumothorax. HEART AND MEDIASTINUM: No acute abnormality of the cardiac and mediastinal silhouettes. BONES AND SOFT TISSUES: No acute osseous abnormality. IMPRESSION: 1. Mild peribronchial thickening, which may reflect bronchitis. Electronically signed by: Franky Crease MD 01/26/2024 07:43 PM EST RP Workstation: [id number]   DG Lumbar Spine Complete Result Date: 01/25/2024 EXAM: 4 VIEW(S) XRAY OF THE LUMBAR SPINE 01/10/2024 02:32:27 PM COMPARISON: None available. CLINICAL HISTORY: pain pain FINDINGS: LUMBAR SPINE: BONES: No radiographic evidence of fracture or pars defect. Alignment is maintained. Vertebral body heights are maintained. DISCS AND DEGENERATIVE CHANGES: Degenerative endplate osteophytes at multiple levels most pronounced at L2-L3. There is mild to moderate disc space narrowing at L2-L3. Mild disc space narrowing at L3-L4 and L4-L5. Facet arthrosis at multiple levels most pronounced in the lower lumbar spine. SOFT TISSUES: No acute abnormality. IMPRESSION: 1. No acute findings. 2. Degenerative changes as above. Electronically signed by: Donnice Mania MD 01/25/2024 09:23 PM EST RP Workstation: [id number]   DG Thoracic Spine W/Swimmers Result Date: 01/25/2024 EXAM: 3 VIEW(S) XRAY OF THE THORACIC SPINE 01/10/2024 02:32:27 PM COMPARISON: None available. CLINICAL HISTORY: pain FINDINGS: BONES: No acute radiographic abnormality of the thoracic spine. Vertebral body heights are maintained. Alignment is maintained. DISCS AND DEGENERATIVE CHANGES: There are degenerative endplate osteophytes throughout the thoracic spine with mild disc space narrowing at multiple levels. SOFT TISSUES: The visualized lungs are clear. IMPRESSION: 1. No acute radiographic abnormality of the thoracic spine. 2. Mild degenerative changes as above. Electronically signed by: Donnice Mania MD 01/25/2024 09:18 PM EST RP Workstation: [id number]   DG Cervical Spine With Flex & Extend Result  Date: 01/25/2024 EXAM: FLEXION AND EXTENSION (2) VIEW(S) XRAY OF THE CERVICAL SPINE 01/10/2024 02:32:27 PM COMPARISON: None available. CLINICAL HISTORY: pain pain FINDINGS: BONES: Vertebral body heights are maintained. Alignment demonstrates straightening of the normal cervical lordosis. There is no significant listhesis on neutral images. On flexion images, there is 3 mm anterolisthesis of C3 on C4, an additional 4 mm anterolisthesis of C4 on C5, and trace anterolisthesis of C5 on C6. There is no significant shift in alignment on extension images. DISCS AND DEGENERATIVE CHANGES: Degenerative endplate osteophytes are most pronounced at C5-C6 and C6-C7. There is mild disc space narrowing at C5-C6 and C6-C7. Facet arthrosis and uncovertebral hypertrophy are present at multiple levels. There is somewhat limited evaluation of the foramina due to positioning. Within these limitations, there is at least mild foraminal stenosis on the left at C3-C4, moderate foraminal stenosis on the left at C4-C5, C5-C6, and C6-C7. There is mild foraminal stenosis on the right at C3-C4, moderate foraminal stenosis on the right at C4-C5 and C5-C6, and mild foraminal stenosis on the right at C6-C7. SOFT TISSUES: No prevertebral soft tissue swelling. The  visualized lungs appear clear. IMPRESSION: 1. Dynamic anterolisthesis on flexion images, measuring 3 mm at C3-C4, 4 mm at C4-C5, and trace at C5-C6, without significant listhesis on neutral views and without significant change on extension views. 2. Moderate left foraminal stenosis at C4-C5, C5-C6, and C6-C7. 3. Moderate right foraminal stenosis at C4-C5 and C5-C6. Electronically signed by: Donnice Mania MD 01/25/2024 09:16 PM EST RP Workstation: [id number]   DG Shoulder Right Result Date: 01/23/2024 EXAM: 1 VIEW(S) XRAY OF THE _LATERALITY_ SHOULDER 01/10/2024 02:32:27 PM COMPARISON: None available. CLINICAL HISTORY: pain FINDINGS: BONES AND JOINTS: Mild glenohumeral degenerative changes.  Glenohumeral joint is normally aligned. No malalignment. No acute fracture. Moderate degenerative changes of the acromioclavicular joint with joint space narrowing and osteophyte formation. SOFT TISSUES: No abnormal calcifications. Visualized lung is unremarkable. IMPRESSION: 1. Moderate acromioclavicular and mild glenohumeral osteoarthritis. Electronically signed by: Donnice Mania MD 01/23/2024 11:19 PM EST RP Workstation: [id number]    Microbiology: Recent Results (from the past 240 hours)  Resp panel by RT-PCR (RSV, Flu A&B, Covid) Anterior Nasal Swab     Status: None   Collection Time: 01/26/24  9:33 PM   Specimen: Anterior Nasal Swab  Result Value Ref Range Status   SARS Coronavirus 2 by RT PCR NEGATIVE NEGATIVE Final    Comment: (NOTE) SARS-CoV-2 target nucleic acids are NOT DETECTED.  The SARS-CoV-2 RNA is generally detectable in upper respiratory specimens during the acute phase of infection. The lowest concentration of SARS-CoV-2 viral copies this assay can detect is 138 copies/mL. A negative result does not preclude SARS-Cov-2 infection and should not be used as the sole basis for treatment or other patient management decisions. A negative result may occur with  improper specimen collection/handling, submission of specimen other than nasopharyngeal swab, presence of viral mutation(s) within the areas targeted by this assay, and inadequate number of viral copies(<138 copies/mL). A negative result must be combined with clinical observations, patient history, and epidemiological information. The expected result is Negative.  Fact Sheet for Patients:  bloggercourse.com  Fact Sheet for Healthcare Providers:  seriousbroker.it  This test is no t yet approved or cleared by the [place]  FDA and  has been authorized for detection and/or diagnosis of SARS-CoV-2 by FDA under an Emergency Use Authorization (EUA). This EUA will remain   in effect (meaning this test can be used) for the duration of the COVID-19 declaration under Section 564(b)(1) of the Act, 21 U.S.C.section 360bbb-3(b)(1), unless the authorization is terminated  or revoked sooner.       Influenza A by PCR NEGATIVE NEGATIVE Final   Influenza B by PCR NEGATIVE NEGATIVE Final    Comment: (NOTE) The Xpert Xpress SARS-CoV-2/FLU/RSV plus assay is intended as an aid in the diagnosis of influenza from Nasopharyngeal swab specimens and should not be used as a sole basis for treatment. Nasal washings and aspirates are unacceptable for Xpert Xpress SARS-CoV-2/FLU/RSV testing.  Fact Sheet for Patients: bloggercourse.com  Fact Sheet for Healthcare Providers: seriousbroker.it  This test is not yet approved or cleared by the [place]  FDA and has been authorized for detection and/or diagnosis of SARS-CoV-2 by FDA under an Emergency Use Authorization (EUA). This EUA will remain in effect (meaning this test can be used) for the duration of the COVID-19 declaration under Section 564(b)(1) of the Act, 21 U.S.C. section 360bbb-3(b)(1), unless the authorization is terminated or revoked.     Resp Syncytial Virus by PCR NEGATIVE NEGATIVE Final    Comment: (NOTE) Fact Sheet for Patients: bloggercourse.com  Fact  Sheet for Healthcare Providers: seriousbroker.it  This test is not yet approved or cleared by the [place]  FDA and has been authorized for detection and/or diagnosis of SARS-CoV-2 by FDA under an Emergency Use Authorization (EUA). This EUA will remain in effect (meaning this test can be used) for the duration of the COVID-19 declaration under Section 564(b)(1) of the Act, 21 U.S.C. section 360bbb-3(b)(1), unless the authorization is terminated or revoked.  Performed at [facility], [address] [place], [place] [address]    Urine Culture (for pregnant, neutropenic or urologic patients or patients with an indwelling urinary catheter)     Status: Abnormal   Collection Time: 01/26/24  9:34 PM   Specimen: Urine, Clean Catch  Result Value Ref Range Status   Specimen Description   Final    URINE, CLEAN CATCH Performed at [facility], [address] [place], [place] [address]    Special Requests   Final    Normal Performed at [facility], [address] [place], [place] [address]    Culture MULTIPLE SPECIES PRESENT, SUGGEST RECOLLECTION (A)  Final   Report Status 01/28/2024 FINAL  Final     Labs: Basic Metabolic Panel: Recent Labs  Lab 01/26/24 1902 01/27/24 0014 01/27/24 0141 01/27/24 0605 01/27/24 2056 01/28/24 0425  NA 137 139 139 141  --  139  K 4.6 4.7 5.1 4.1  --  4.1  CL 97* 99 102 103  --  103  CO2 25 26 27 26   --  24  GLUCOSE 165* 90 83 103*  --  123*  BUN 69* 71* 68* 64*  --  53*  CREATININE 2.72* 2.74* 2.63* 2.58* 2.17* 2.18*  CALCIUM  9.9 10.0 9.5 9.6  --  9.7   Liver Function Tests: Recent Labs  Lab 01/26/24 1902 01/28/24 0425  AST 23 20  ALT 17 17  ALKPHOS 69 51  BILITOT 0.5 0.6  PROT 7.8 7.1  ALBUMIN 4.3 3.9   No results for input(s): LIPASE, AMYLASE in the last 168 hours. No results for input(s): AMMONIA in the last 168 hours. CBC: Recent Labs  Lab 01/26/24 1902 01/27/24 0605 01/27/24 2056 01/28/24 0425  WBC 9.0 8.4 8.0 7.3  HGB 11.6* 10.6* 10.3* 10.2*  HCT 35.0* 31.8* 31.1* 31.4*  MCV 90.7 90.3 90.4 91.3  PLT 230 192 215 201   Cardiac Enzymes: No results for input(s): CKTOTAL, CKMB, CKMBINDEX, TROPONINI in the last 168 hours. BNP: BNP (last 3 results) No results for input(s): BNP in the last 8760 hours.  ProBNP (last 3 results) No results for input(s): PROBNP in the last 8760 hours.  CBG: Recent Labs  Lab 01/27/24 1123 01/27/24 1710 01/27/24 2113 01/28/24 0826 01/28/24 1146  GLUCAP 95 90 118* 110* 156*        Signed:  Devaughn KATHEE Ban MD.  [facility] 01/28/2024, 1:00 PM     [1]  Allergies Allergen Reactions   Amlodipine  Swelling    Leg swelling   Avapro  [Irbesartan ] Other (See Comments)    headaches   Codeine Nausea And Vomiting   Dulaglutide  Nausea And Vomiting   Lisinopril  Itching   Saxagliptin-Metformin  Er Other (See Comments)   Sitagliptin Phos-Metformin  Hcl Other (See Comments)

## 2024-01-29 ENCOUNTER — Telehealth: Payer: Self-pay

## 2024-01-29 NOTE — Transitions of Care (Post Inpatient/ED Visit) (Signed)
 "  01/29/2024  Name: Stephanie [name] MRN: [id number] DOB: Apr 28, 1955  Today's TOC FU Call Status: Today's TOC FU Call Status:: Successful TOC FU Call Completed TOC FU Call Complete Date: 01/29/24  Patient's Name and Date of Birth confirmed. Name, DOB  Transition Care Management Follow-up Telephone Call Date of Discharge: 01/28/24 Discharge Facility: [facility]) Type of Discharge: Inpatient Admission Primary Inpatient Discharge Diagnosis:: cerebral infarction How have you been since you were released from the hospital?: Better Any questions or concerns?: No  Items Reviewed: Did you receive and understand the discharge instructions provided?: Yes Medications obtained,verified, and reconciled?: Yes (Medications Reviewed) Any new allergies since your discharge?: No Dietary orders reviewed?: Yes  Medications Reviewed Today: Medications Reviewed Today     Reviewed by Emmitt Pan, LPN (Licensed Practical Nurse) on 01/29/24 at 1536  Med List Status: <None>   Medication Order Taking? Sig Documenting Provider Last Dose Status Informant  Accu-Chek FastClix Lancets MISC [id number] Yes Use accu chek fastclix lancets to check blood sugar 2-3 times daily. DX:E11.65 Von Pacific, MD  Active Self, Pharmacy Records  acetaminophen  (TYLENOL ) 325 MG tablet [id number] Yes Take 2 tablets (650 mg total) by mouth every 6 (six) hours as needed for mild pain, fever or headache. Vicci Burnard SAUNDERS, PA-C  Active Self, Pharmacy Records           Med Note [phone], ARLEY M   Sun Jan 28, 2024 10:41 AM) PRN  albuterol  (VENTOLIN  HFA) 108 (90 Base) MCG/ACT inhaler [id number] Yes Inhale 2 puffs into the lungs every 6 (six) hours as needed for wheezing or shortness of breath. Berneta Elsie Sayre, MD  Active Self, Pharmacy Records           Med Note [phone], UTAH CHRISTELLA Repress Jan 28, 2024 10:33 AM) PRN  allopurinol  (ZYLOPRIM ) 100 MG tablet [id number] Yes Take 0.5 tablets (50 mg total) by mouth  every other day. Sebastian Beverley NOVAK, MD  Active Self, Pharmacy Records  ascorbic acid  (VITAMIN C ) 500 MG tablet [id number] Yes Take 1 tablet (500 mg total) by mouth 3 (three) times daily. Vicci Burnard SAUNDERS, PA-C  Active Self, Pharmacy Records  aspirin  EC 81 MG tablet [id number] Yes Take 1 tablet (81 mg total) by mouth daily. Swallow whole. Wouk, Devaughn Sayres, MD  Active   Blood Glucose Monitoring Suppl (ACCU-CHEK GUIDE ME) w/Device KIT [id number] Yes 1 each by Does not apply route 3 (three) times daily. Use accu chek guide me to check blood sugar 2-3 times daily. DX:E11.65 Von Pacific, MD  Active Self, Pharmacy Records  diclofenac  Sodium (VOLTAREN ) 1 % GEL [id number]  Apply 4 g topically 4 (four) times daily as needed.  Patient not taking: Reported on 01/29/2024   Sebastian Beverley NOVAK, MD  Active Self, Pharmacy Records  ferrous sulfate  325 [id number] FE) MG tablet [id number] Yes Take 1 tablet (325 mg total) by mouth 3 (three) times daily with meals. Vicci Burnard SAUNDERS, PA-C  Active Self, Pharmacy Records  furosemide  (LASIX ) 40 MG tablet [id number] Yes Take 1 tablet (40 mg total) by mouth daily. Takes Tues., Thurs., Sat. Berneta Elsie Sayre, MD  Active Self, Pharmacy Records  glucose blood (ACCU-CHEK GUIDE TEST) test strip [id number] Yes Use to test blood glucose before meals and at bedtime Thapa, [place], MD  Active Self, Pharmacy Records  glucose blood (ACCU-CHEK GUIDE) test strip [id number] Yes USE TO CHECK BLOOD GLUCOSE THREE TO FOUR TIMES DAILY Von Pacific, MD  Active Self, Pharmacy Records  hydrocortisone  valerate  cream (WESTCORT ) 0.2 % [id number] Yes APPLY A THIN COAT TO RASH DAILY AS NEEDED Berneta Elsie Sayre, MD  Active Self, Pharmacy Records  indapamide  (LOZOL ) 1.25 MG tablet [id number]  Take 1 tablet (1.25 mg total) by mouth daily.  Patient not taking: Reported on 01/29/2024   Sebastian Beverley NOVAK, MD  Active Self, Pharmacy Records  Insulin  Lispro Prot & Lispro (HUMALOG  MIX 75/25 KWIKPEN) (75-25) 100 UNIT/ML Kary  [id number] Yes INJECT 24 UNITS  BEFORE  DINNER Thapa, Sudan, MD  Active Self, Pharmacy Records  Insulin  Pen Needle (BD PEN NEEDLE NANO 2ND GEN) 32G X 4 MM MISC [id number] Yes USE 1  THREE TIMES DAILY Thapa, Sudan, MD  Active Self, Pharmacy Records  Insulin  Pen Needle (RELION PEN NEEDLES) 31G X 6 MM MISC [id number] Yes USE 1  THREE TIMES DAILY Von Pacific, MD  Active Self, Pharmacy Records  labetalol  (NORMODYNE ) 100 MG tablet [id number] Yes Take 1 tablet (100 mg total) by mouth 2 (two) times daily. Thapa, Sudan, MD  Active Self, Pharmacy Records  potassium chloride  (KLOR-CON  M) 10 MEQ tablet [id number] Yes Take 1 tablet (10 mEq total) by mouth daily. Thapa, Sudan, MD  Active Self, Pharmacy Records  rosuvastatin  (CRESTOR ) 40 MG tablet [id number] Yes Take 1 tablet (40 mg total) by mouth daily. Thapa, Sudan, MD  Active Self, Pharmacy Records  Semaglutide , 2 MG/DOSE, 8 MG/3ML SOPN [id number] Yes Inject 2 mg as directed once a week. Thapa, Sudan, MD  Active Self, Pharmacy Records  TRESIBA  FLEXTOUCH 100 UNIT/ML FlexTouch Pen [id number] Yes Inject 12 Units into the skin daily. Thapa, Sudan, MD  Active Self, Pharmacy Records  valsartan  (DIOVAN ) 320 MG tablet [id number] Yes Take 0.5 tablets (160 mg total) by mouth daily. Wouk, Devaughn Sayres, MD  Active             Home Care and Equipment/Supplies: Were Home Health Services Ordered?: Yes Name of Home Health Agency:: unknown Has Agency set up a time to come to your home?: No Any new equipment or medical supplies ordered?: NA  Functional Questionnaire: Do you need assistance with bathing/showering or dressing?: No Do you need assistance with meal preparation?: No Do you need assistance with eating?: No Do you have difficulty maintaining continence: No Do you need assistance with getting out of bed/getting out of a chair/moving?: No Do you have difficulty managing or taking your medications?: No  Follow up appointments reviewed: PCP Follow-up appointment  confirmed?: No (no avail appt, sent message to PCP) MD Provider Line [phone] Given: No Specialist Hospital Follow-up appointment confirmed?: No Reason Specialist Follow-Up Not Confirmed: Patient has Specialist Provider Number and will Call for Appointment Do you need transportation to your follow-up appointment?: No Do you understand care options if your condition(s) worsen?: Yes-patient verbalized understanding    SIGNATURE Julian Lemmings, LPN [facility] Nurse Health Advisor Direct Dial [phone]  "

## 2024-01-29 NOTE — Telephone Encounter (Signed)
 Patient went to ED on 01/26/24. Dm/cma

## 2024-01-29 NOTE — Progress Notes (Signed)
 MATRACA HUNKINS                                          MRN: [id number]   01/29/2024   The [facility] Quality Team Specialist reviewed this patient medical record for the purposes of chart review for care gap closure. The following were reviewed: abstraction for care gap closure-glycemic status assessment.    [facility] Quality Team

## 2024-02-07 ENCOUNTER — Telehealth: Payer: Self-pay

## 2024-02-07 NOTE — Telephone Encounter (Signed)
 Patient called stating that she feels like her blood glucose may be too low in the mornings. Patient stated for the last week 77,85,100, and today 92 has been her readings. Patient made aware that these are WNL and asked if she was having any other symptoms such as sweaty, clammy, confusion etc. Per patient she has not had any other symptoms. Patient admits to being at hospital x a week ago and MRI done stated showed mild stroke. States she has heard from the specialist office she is just awaiting her appt.

## 2024-02-07 NOTE — Telephone Encounter (Signed)
 Blood sugars are normal but in the lower normal range.  She has perfectly controlled type 2 diabetes mellitus.  She is on relatively high dose of Humalog  mix insulin  for the supper.  Decrease Humalog  mix from 24 to 20 units with supper.  Rest of the medications she can keep the same.  Carmelle Bamberg, MD [facility] [address] [place], [address] [place], [place] [address] Phone # [phone]

## 2024-02-07 NOTE — Telephone Encounter (Signed)
 Patient called and given the advisement as directed by MD

## 2024-02-09 ENCOUNTER — Telehealth: Payer: Self-pay

## 2024-02-09 DIAGNOSIS — E1159 Type 2 diabetes mellitus with other circulatory complications: Secondary | ICD-10-CM

## 2024-02-09 MED ORDER — VALSARTAN 160 MG PO TABS: 160.0000 mg | ORAL_TABLET | Freq: Every day | ORAL | 3 refills | Status: AC

## 2024-02-09 NOTE — Telephone Encounter (Signed)
 Refilled rx

## 2024-02-09 NOTE — Telephone Encounter (Signed)
 Copied from CRM #[id number]. Topic: Clinical - Medication Refill >> Feb 09, 2024 10:25 AM Maisie BROCKS wrote:  Medication: Valsartan   Has the patient contacted their pharmacy? Yes Needs a new prescription.  This is the patient's preferred pharmacy:  [facility] [address] [place] - [address] [place] [address] Phone: [phone] Fax: [phone]  Is this the correct pharmacy for this prescription? Yes If no, delete pharmacy and type the correct one.   Has the prescription been filled recently? Yes  Is the patient out of the medication? No  Has the patient been seen for an appointment in the last year OR does the patient have an upcoming appointment? Yes  Can we respond through MyChart? No  Agent: Please be advised that Rx refills may take up to 3 business days. We ask that you follow-up with your pharmacy.

## 2024-02-09 NOTE — Telephone Encounter (Signed)
 Requesting: Valsartan  320mg  Last Visit: 01/10/2024 Next Visit: 03/07/2024 Last Refill: 01/28/2024 by Kandis Devaughn Sayres, MD   Please Advise    I called and spoke with patient and told her that she should have received a paper Rx when being discharged form hospital. She will look through her papers but in the meantime is out of medication.

## 2024-02-22 ENCOUNTER — Ambulatory Visit: Admitting: Physical Therapy

## 2024-02-27 ENCOUNTER — Ambulatory Visit

## 2024-03-07 ENCOUNTER — Ambulatory Visit: Admitting: Family Medicine

## 2024-03-25 ENCOUNTER — Ambulatory Visit: Admitting: Endocrinology

## 2024-04-23 ENCOUNTER — Ambulatory Visit
